# Patient Record
Sex: Male | Born: 1945 | Race: White | Hispanic: No | Marital: Married | Smoking: Former smoker
Health system: Southern US, Community
[De-identification: ages and names within clinical notes are randomized; demographics above are authoritative.]

## PROBLEM LIST (undated history)

## (undated) DIAGNOSIS — K5792 Diverticulitis of intestine, part unspecified, without perforation or abscess without bleeding: Secondary | ICD-10-CM

## (undated) DIAGNOSIS — K219 Gastro-esophageal reflux disease without esophagitis: Secondary | ICD-10-CM

## (undated) DIAGNOSIS — I77810 Thoracic aortic ectasia: Secondary | ICD-10-CM

## (undated) DIAGNOSIS — I480 Paroxysmal atrial fibrillation: Secondary | ICD-10-CM

## (undated) DIAGNOSIS — I499 Cardiac arrhythmia, unspecified: Secondary | ICD-10-CM

## (undated) DIAGNOSIS — M1990 Unspecified osteoarthritis, unspecified site: Secondary | ICD-10-CM

## (undated) DIAGNOSIS — E785 Hyperlipidemia, unspecified: Secondary | ICD-10-CM

## (undated) DIAGNOSIS — M75100 Unspecified rotator cuff tear or rupture of unspecified shoulder, not specified as traumatic: Secondary | ICD-10-CM

## (undated) DIAGNOSIS — N529 Male erectile dysfunction, unspecified: Secondary | ICD-10-CM

## (undated) DIAGNOSIS — I82409 Acute embolism and thrombosis of unspecified deep veins of unspecified lower extremity: Secondary | ICD-10-CM

## (undated) DIAGNOSIS — I700 Atherosclerosis of aorta: Secondary | ICD-10-CM

## (undated) DIAGNOSIS — Z9289 Personal history of other medical treatment: Secondary | ICD-10-CM

## (undated) DIAGNOSIS — Z9889 Other specified postprocedural states: Secondary | ICD-10-CM

## (undated) DIAGNOSIS — N419 Inflammatory disease of prostate, unspecified: Secondary | ICD-10-CM

## (undated) DIAGNOSIS — K5790 Diverticulosis of intestine, part unspecified, without perforation or abscess without bleeding: Secondary | ICD-10-CM

## (undated) DIAGNOSIS — Z923 Personal history of irradiation: Secondary | ICD-10-CM

## (undated) DIAGNOSIS — C61 Malignant neoplasm of prostate: Secondary | ICD-10-CM

## (undated) HISTORY — DX: Inflammatory disease of prostate, unspecified: N41.9

## (undated) HISTORY — DX: Acute embolism and thrombosis of unspecified deep veins of unspecified lower extremity: I82.409

## (undated) HISTORY — DX: Gastro-esophageal reflux disease without esophagitis: K21.9

## (undated) HISTORY — PX: KNEE ARTHROSCOPY: SUR90

## (undated) HISTORY — PX: LARYNX SURGERY: SHX692

## (undated) HISTORY — DX: Paroxysmal atrial fibrillation: I48.0

## (undated) HISTORY — DX: Hyperlipidemia, unspecified: E78.5

## (undated) HISTORY — PX: OTHER SURGICAL HISTORY: SHX169

## (undated) HISTORY — DX: Diverticulosis of intestine, part unspecified, without perforation or abscess without bleeding: K57.90

## (undated) HISTORY — DX: Unspecified osteoarthritis, unspecified site: M19.90

## (undated) HISTORY — DX: Male erectile dysfunction, unspecified: N52.9

## (undated) HISTORY — PX: POLYPECTOMY: SHX149

## (undated) HISTORY — DX: Malignant neoplasm of prostate: C61

## (undated) HISTORY — DX: Personal history of other medical treatment: Z92.89

## (undated) HISTORY — PX: COLONOSCOPY: SHX174

## (undated) HISTORY — PX: APPENDECTOMY: SHX54

## (undated) HISTORY — PX: MENISCUS REPAIR: SHX5179

## (undated) HISTORY — DX: Diverticulitis of intestine, part unspecified, without perforation or abscess without bleeding: K57.92

## (undated) HISTORY — DX: Other specified postprocedural states: Z98.890

---

## 1965-03-20 HISTORY — PX: APPENDECTOMY: SHX54

## 1997-03-20 HISTORY — PX: PROSTATE BIOPSY: SHX241

## 1998-03-09 ENCOUNTER — Other Ambulatory Visit: Admission: RE | Admit: 1998-03-09 | Discharge: 1998-03-09 | Payer: Self-pay | Admitting: Urology

## 1998-03-20 HISTORY — PX: LARYNX SURGERY: SHX692

## 2001-02-19 ENCOUNTER — Encounter: Admission: RE | Admit: 2001-02-19 | Discharge: 2001-02-19 | Payer: Self-pay | Admitting: Otolaryngology

## 2001-02-19 ENCOUNTER — Encounter: Payer: Self-pay | Admitting: Otolaryngology

## 2001-11-24 ENCOUNTER — Ambulatory Visit (HOSPITAL_COMMUNITY): Admission: RE | Admit: 2001-11-24 | Discharge: 2001-11-24 | Payer: Self-pay | Admitting: Internal Medicine

## 2002-03-02 ENCOUNTER — Encounter: Payer: Self-pay | Admitting: Emergency Medicine

## 2002-03-02 ENCOUNTER — Emergency Department (HOSPITAL_COMMUNITY): Admission: EM | Admit: 2002-03-02 | Discharge: 2002-03-02 | Payer: Self-pay | Admitting: Emergency Medicine

## 2003-10-25 ENCOUNTER — Emergency Department (HOSPITAL_COMMUNITY): Admission: EM | Admit: 2003-10-25 | Discharge: 2003-10-25 | Payer: Self-pay | Admitting: Emergency Medicine

## 2004-08-04 ENCOUNTER — Encounter: Admission: RE | Admit: 2004-08-04 | Discharge: 2004-08-04 | Payer: Self-pay | Admitting: Internal Medicine

## 2006-02-21 ENCOUNTER — Inpatient Hospital Stay (HOSPITAL_COMMUNITY): Admission: EM | Admit: 2006-02-21 | Discharge: 2006-02-23 | Payer: Self-pay | Admitting: Urology

## 2006-03-01 ENCOUNTER — Ambulatory Visit (HOSPITAL_COMMUNITY): Admission: RE | Admit: 2006-03-01 | Discharge: 2006-03-01 | Payer: Self-pay | Admitting: Urology

## 2006-03-05 ENCOUNTER — Inpatient Hospital Stay (HOSPITAL_COMMUNITY): Admission: AD | Admit: 2006-03-05 | Discharge: 2006-03-08 | Payer: Self-pay | Admitting: Urology

## 2006-03-05 ENCOUNTER — Ambulatory Visit (HOSPITAL_COMMUNITY): Admission: RE | Admit: 2006-03-05 | Discharge: 2006-03-05 | Payer: Self-pay | Admitting: Urology

## 2006-03-06 ENCOUNTER — Ambulatory Visit: Payer: Self-pay | Admitting: Infectious Diseases

## 2006-04-25 ENCOUNTER — Inpatient Hospital Stay (HOSPITAL_COMMUNITY): Admission: RE | Admit: 2006-04-25 | Discharge: 2006-04-27 | Payer: Self-pay | Admitting: Urology

## 2006-04-25 DIAGNOSIS — C61 Malignant neoplasm of prostate: Secondary | ICD-10-CM

## 2006-04-25 HISTORY — DX: Malignant neoplasm of prostate: C61

## 2006-04-25 HISTORY — PX: PROSTATECTOMY: SHX69

## 2006-05-19 ENCOUNTER — Emergency Department (HOSPITAL_COMMUNITY): Admission: EM | Admit: 2006-05-19 | Discharge: 2006-05-19 | Payer: Self-pay | Admitting: Emergency Medicine

## 2008-09-25 ENCOUNTER — Encounter (INDEPENDENT_AMBULATORY_CARE_PROVIDER_SITE_OTHER): Payer: Self-pay | Admitting: *Deleted

## 2010-04-21 NOTE — Letter (Signed)
Summary: Recall Colonoscopy Date Change Letter  [facility]  [address] [place], [place] [address]   Phone: [phone]  Fax: [phone]      September 25, 2008 MRN: [id number]   Brandon Simmons [address] [place], [place]  [address]   Dear Mr. DAVIDSON,   Previously you were recommended to have a repeat colonoscopy around this time. Your chart was recently reviewed by Dr. Marina Goodell of [facility]. Follow up colonoscopy is now recommended in September 2013. This revised recommendation is based on current, nationally recognized guidelines for colorectal cancer screening and polyp surveillance. These guidelines are endorsed by the American Cancer Society, [employer] on Colorectal Cancer as well as numerous other major medical organizations.  Please understand that our recommendation assumes that you do not have any new symptoms such as bleeding, a change in bowel habits, anemia, or significant abdominal discomfort. If you do have any concerning GI symptoms or want to discuss the guideline recommendations, please call to arrange an office visit at your earliest convenience. Otherwise we will keep you in our reminder system and contact you 1-2 months prior to the date listed above to schedule your next colonoscopy.  Thank you,  Wilhemina Bonito. Marina Goodell, M.D.  [facility] [phone]

## 2011-05-08 ENCOUNTER — Encounter: Payer: Self-pay | Admitting: Internal Medicine

## 2011-05-08 ENCOUNTER — Other Ambulatory Visit: Payer: Self-pay | Admitting: Internal Medicine

## 2011-05-08 ENCOUNTER — Ambulatory Visit
Admission: RE | Admit: 2011-05-08 | Discharge: 2011-05-08 | Disposition: A | Discharge status: Home / Self Care | Payer: Medicare Other | Source: Ambulatory Visit | Attending: Internal Medicine | Admitting: Internal Medicine

## 2011-05-08 DIAGNOSIS — R1031 Right lower quadrant pain: Secondary | ICD-10-CM

## 2011-05-08 DIAGNOSIS — J45909 Unspecified asthma, uncomplicated: Secondary | ICD-10-CM | POA: Diagnosis not present

## 2011-05-08 DIAGNOSIS — K5730 Diverticulosis of large intestine without perforation or abscess without bleeding: Secondary | ICD-10-CM | POA: Diagnosis not present

## 2011-05-08 MED ORDER — IOHEXOL 300 MG/ML  SOLN
125.0000 mL | Freq: Once | INTRAMUSCULAR | Status: AC | PRN
  Administered 2011-05-08: 125 mL via INTRAVENOUS

## 2011-05-12 DIAGNOSIS — C61 Malignant neoplasm of prostate: Secondary | ICD-10-CM | POA: Diagnosis not present

## 2011-05-26 DIAGNOSIS — C61 Malignant neoplasm of prostate: Secondary | ICD-10-CM | POA: Diagnosis not present

## 2011-05-26 DIAGNOSIS — N529 Male erectile dysfunction, unspecified: Secondary | ICD-10-CM | POA: Diagnosis not present

## 2011-05-31 DIAGNOSIS — J309 Allergic rhinitis, unspecified: Secondary | ICD-10-CM | POA: Diagnosis not present

## 2011-06-02 DIAGNOSIS — J309 Allergic rhinitis, unspecified: Secondary | ICD-10-CM | POA: Diagnosis not present

## 2011-06-05 ENCOUNTER — Encounter: Payer: Self-pay | Admitting: Internal Medicine

## 2011-06-05 ENCOUNTER — Ambulatory Visit (INDEPENDENT_AMBULATORY_CARE_PROVIDER_SITE_OTHER): Payer: Medicare Other | Admitting: Internal Medicine

## 2011-06-05 VITALS — BP 124/76 | HR 80 | Ht 70.0 in | Wt 212.0 lb

## 2011-06-05 DIAGNOSIS — R1031 Right lower quadrant pain: Secondary | ICD-10-CM

## 2011-06-05 DIAGNOSIS — Z1211 Encounter for screening for malignant neoplasm of colon: Secondary | ICD-10-CM | POA: Diagnosis not present

## 2011-06-05 DIAGNOSIS — J309 Allergic rhinitis, unspecified: Secondary | ICD-10-CM | POA: Diagnosis not present

## 2011-06-05 MED ORDER — PEG-KCL-NACL-NASULF-NA ASC-C 100 G PO SOLR: 1.0000 | Freq: Once | ORAL | Status: DC

## 2011-06-05 NOTE — Patient Instructions (Signed)
You have been scheduled for a colonoscopy with propofol. Please follow written instructions given to you at your visit today.  Please pick up your prep kit at the pharmacy within the next 1-3 days.  

## 2011-06-05 NOTE — Progress Notes (Signed)
HISTORY OF PRESENT ILLNESS:  Brandon Simmons is a 66 y.o. male with the below listed medical history who presents today regarding right lower quadrant/flank discomfort in the need for followup colonoscopy. He last underwent colonoscopy in September 2003 to evaluate rectal bleeding. He was found to have significant left-sided diverticulosis, internal hemorrhoids, and prep induced mild colitis. Followup in 10 years recommended. He was doing well until this fall when he began to notice vague right lower quadrant/flank/back discomfort. The discomfort worsened in late December early January. He underwent acupuncture therapy which did not help. He subsequently brought this to the attention of his primary provider. CT scan of the abdomen and pelvis with contrast was unremarkable. Diverticulosis without diverticulitis and moderate colonic stool noted. This examination was performed 05/08/2011. He is also status post prostatectomy in 2008 for prostate cancer. Comprehensive metabolic panel last month was normal. He cannot identify any exacerbating or relieving factors. He denies any abnormal bowel habits or change in bowel habits. No bleeding, weight loss, or other types of abdominal discomfort. No history of chronic back pain or kidney stones. No hematuria. He is also status post remote appendectomy. No bulge. Most concerned about ruling out serious pathology  REVIEW OF SYSTEMS:  All non-GI ROS negative except for night sweats  Past Medical History  Diagnosis Date  . Hemorrhoids   . Diverticulosis   . Prostatitis   . Asthma   . GERD (gastroesophageal reflux disease)   . Prostate cancer     2008  . Organic erectile dysfunction   . Diverticulitis   . Arthritis     Past Surgical History  Procedure Date  . Prostatectomy     2008  . Knee arthroscopy     R  . Larynx surgery   . Appendectomy     1967    Social History Brandon Simmons  reports that he has quit smoking. He has never used  smokeless tobacco. He reports that he drinks alcohol. He reports that he does not use illicit drugs.  family history includes Diabetes in his maternal grandmother; Emphysema in his father; Heart disease in his maternal grandfather, maternal grandmother, paternal grandfather, and paternal grandmother; Liver cancer in an unspecified family member; and Lung cancer in an unspecified family member.  No Known Allergies     PHYSICAL EXAMINATION: Vital signs: BP 124/76  Pulse 80  Ht 5\' 10"  (1.778 m)  Wt 212 lb (96.163 kg)  BMI 30.42 kg/m2  Constitutional: generally well-appearing, no acute distress Psychiatric: alert and oriented x3, cooperative Eyes: extraocular movements intact, anicteric, conjunctiva pink Mouth: oral pharynx moist, no lesions Neck: supple no lymphadenopathy Cardiovascular: heart regular rate and rhythm, no murmur Lungs: clear to auscultation bilaterally Abdomen: soft, nontender, nondistended, no obvious ascites, no peritoneal signs, normal bowel sounds, no organomegaly. Prior surgical incisions well-healed Rectal:deferred until colonoscopy Extremities: no lower extremity edema bilaterally Skin: no lesions on visible extremities Neuro: No focal deficits.    ASSESSMENT:  #1. Vague right lower quadrant/flank/back pain, of uncertain etiology. Based on history and negative imaging, likely musculoskeletal. #2. Colonoscopy 10 years ago revealing diverticulosis. Due for followup screening #3. History of prostate cancer status post prostatectomy 2008   PLAN:  #1. Colonoscopy.The nature of the procedure, as well as the risks, benefits, and alternatives were carefully and thoroughly reviewed with the patient. Ample time for discussion and questions allowed. The patient understood, was satisfied, and agreed to proceed. Movi prep prescribed. The patient instructed on its use #2. If colonoscopy unrevealing,  then symptomatic therapies as needed

## 2011-06-06 ENCOUNTER — Encounter: Payer: Self-pay | Admitting: Internal Medicine

## 2011-06-07 DIAGNOSIS — J309 Allergic rhinitis, unspecified: Secondary | ICD-10-CM | POA: Diagnosis not present

## 2011-06-13 ENCOUNTER — Encounter: Payer: Self-pay | Admitting: Internal Medicine

## 2011-06-13 ENCOUNTER — Ambulatory Visit (AMBULATORY_SURGERY_CENTER): Payer: Medicare Other | Admitting: Internal Medicine

## 2011-06-13 VITALS — BP 134/74 | HR 85 | Temp 96.8°F | Resp 20 | Ht 70.0 in | Wt 212.0 lb

## 2011-06-13 DIAGNOSIS — Z1211 Encounter for screening for malignant neoplasm of colon: Secondary | ICD-10-CM | POA: Diagnosis not present

## 2011-06-13 DIAGNOSIS — K219 Gastro-esophageal reflux disease without esophagitis: Secondary | ICD-10-CM | POA: Diagnosis not present

## 2011-06-13 DIAGNOSIS — R109 Unspecified abdominal pain: Secondary | ICD-10-CM | POA: Diagnosis not present

## 2011-06-13 DIAGNOSIS — K5730 Diverticulosis of large intestine without perforation or abscess without bleeding: Secondary | ICD-10-CM

## 2011-06-13 DIAGNOSIS — J45909 Unspecified asthma, uncomplicated: Secondary | ICD-10-CM | POA: Diagnosis not present

## 2011-06-13 DIAGNOSIS — R1031 Right lower quadrant pain: Secondary | ICD-10-CM

## 2011-06-13 MED ORDER — SODIUM CHLORIDE 0.9 % IV SOLN: 500.0000 mL | INTRAVENOUS | Status: DC

## 2011-06-13 NOTE — Op Note (Signed)
[facility] [address] [place], [place]  [address]  COLONOSCOPY PROCEDURE REPORT  PATIENT:  Brandon Simmons, Brandon Simmons  MR#:  [id number] BIRTHDATE:  19-Jul-1945, 65 yrs. old  GENDER:  male ENDOSCOPIST:  Wilhemina Bonito. Eda Keys, MD REF. BY:  Office PROCEDURE DATE:  06/13/2011 PROCEDURE:  Average-risk screening colonoscopy G0121 ASA CLASS:  Class II INDICATIONS:  Screening ; negative exam 11-2001; c/o some RLQ / flank / back discomfort MEDICATIONS:   MAC sedation, administered by CRNA, propofol (Diprivan) 400 mg IV  DESCRIPTION OF PROCEDURE:   After the risks benefits and alternatives of the procedure were thoroughly explained, informed consent was obtained.  Digital rectal exam was performed and revealed no abnormalities.   The LB CF-H180AL [id number] endoscope was introduced through the anus and advanced to the cecum, which was identified by both the appendix and ileocecal valve, without limitations.  The quality of the prep was excellent, using MoviPrep.  The instrument was then slowly withdrawn as the colon was fully examined. <<PROCEDUREIMAGES>>  FINDINGS:  Moderate diverticulosis was found in the left colon. Otherwise normal colonoscopy without  polyps, masses, vascular ectasias, or inflammatory changes.  The terminal ileum appeared normal.   Retroflexed views in the rectum revealed internal hemorrhoids.    The time to cecum = 4:05  minutes. The scope was then withdrawn in 13:43  minutes from the cecum and the procedure completed.  COMPLICATIONS:  None  ENDOSCOPIC IMPRESSION: 1) Moderate diverticulosis in the left colon 2) Otherwise normal colonoscopy 3) Normal terminal ileum 4) Internal hemorrhoids  RECOMMENDATIONS: 1) Continue current colorectal screening recommendations for "routine risk" patients with a repeat colonoscopy in 10 years.  ______________________________ Wilhemina Bonito. Eda Keys, MD  CC:  Jarome Matin, MD;  The Patient  n. eSIGNED:   Wilhemina Bonito. Eda Keys at  06/13/2011 12:15 PM  Janne Lab, [id number]

## 2011-06-13 NOTE — Patient Instructions (Signed)

## 2011-06-13 NOTE — Progress Notes (Signed)
Patient did not experience any of the following events: a burn prior to discharge; a fall within the facility; wrong site/side/patient/procedure/implant event; or a hospital transfer or hospital admission upon discharge from the facility. (G8907) Patient did not have preoperative order for IV antibiotic SSI prophylaxis. (G8918)  

## 2011-06-14 ENCOUNTER — Telehealth: Payer: Self-pay | Admitting: *Deleted

## 2011-06-14 NOTE — Telephone Encounter (Signed)
  Follow up Call-  Call back number 06/13/2011  Post procedure Call Back phone  # [phone]  Permission to leave phone message Yes     Patient questions:  Do you have a fever, pain , or abdominal swelling? no Pain Score  0 *  Have you tolerated food without any problems? yes  Have you been able to return to your normal activities? yes  Do you have any questions about your discharge instructions: Diet   no Medications  no Follow up visit  no  Do you have questions or concerns about your Care? no  Actions: * If pain score is 4 or above: No action needed, pain <4.

## 2011-06-15 DIAGNOSIS — J309 Allergic rhinitis, unspecified: Secondary | ICD-10-CM | POA: Diagnosis not present

## 2011-06-19 DIAGNOSIS — J309 Allergic rhinitis, unspecified: Secondary | ICD-10-CM | POA: Diagnosis not present

## 2011-07-18 DIAGNOSIS — J309 Allergic rhinitis, unspecified: Secondary | ICD-10-CM | POA: Diagnosis not present

## 2011-08-07 DIAGNOSIS — J309 Allergic rhinitis, unspecified: Secondary | ICD-10-CM | POA: Diagnosis not present

## 2011-08-18 DIAGNOSIS — R51 Headache: Secondary | ICD-10-CM | POA: Diagnosis not present

## 2011-08-21 DIAGNOSIS — H259 Unspecified age-related cataract: Secondary | ICD-10-CM | POA: Diagnosis not present

## 2011-08-21 DIAGNOSIS — H52209 Unspecified astigmatism, unspecified eye: Secondary | ICD-10-CM | POA: Diagnosis not present

## 2011-08-21 DIAGNOSIS — H5315 Visual distortions of shape and size: Secondary | ICD-10-CM | POA: Diagnosis not present

## 2011-08-21 DIAGNOSIS — H3530 Unspecified macular degeneration: Secondary | ICD-10-CM | POA: Diagnosis not present

## 2011-08-22 ENCOUNTER — Other Ambulatory Visit: Payer: Self-pay | Admitting: Internal Medicine

## 2011-08-22 DIAGNOSIS — R51 Headache: Secondary | ICD-10-CM

## 2011-08-31 ENCOUNTER — Ambulatory Visit
Admission: RE | Admit: 2011-08-31 | Discharge: 2011-08-31 | Disposition: A | Discharge status: Home / Self Care | Payer: Medicare Other | Source: Ambulatory Visit | Attending: Internal Medicine | Admitting: Internal Medicine

## 2011-08-31 DIAGNOSIS — G319 Degenerative disease of nervous system, unspecified: Secondary | ICD-10-CM | POA: Diagnosis not present

## 2011-08-31 DIAGNOSIS — R51 Headache: Secondary | ICD-10-CM

## 2011-09-04 DIAGNOSIS — J309 Allergic rhinitis, unspecified: Secondary | ICD-10-CM | POA: Diagnosis not present

## 2011-10-02 DIAGNOSIS — J309 Allergic rhinitis, unspecified: Secondary | ICD-10-CM | POA: Diagnosis not present

## 2011-10-11 DIAGNOSIS — H259 Unspecified age-related cataract: Secondary | ICD-10-CM | POA: Diagnosis not present

## 2011-10-30 DIAGNOSIS — J309 Allergic rhinitis, unspecified: Secondary | ICD-10-CM | POA: Diagnosis not present

## 2011-11-19 HISTORY — PX: CATARACT EXTRACTION W/ INTRAOCULAR LENS IMPLANT: SHX1309

## 2011-11-28 DIAGNOSIS — J309 Allergic rhinitis, unspecified: Secondary | ICD-10-CM | POA: Diagnosis not present

## 2011-12-12 DIAGNOSIS — H25019 Cortical age-related cataract, unspecified eye: Secondary | ICD-10-CM | POA: Diagnosis not present

## 2011-12-12 DIAGNOSIS — H2510 Age-related nuclear cataract, unspecified eye: Secondary | ICD-10-CM | POA: Diagnosis not present

## 2011-12-12 DIAGNOSIS — H25049 Posterior subcapsular polar age-related cataract, unspecified eye: Secondary | ICD-10-CM | POA: Diagnosis not present

## 2011-12-12 DIAGNOSIS — H269 Unspecified cataract: Secondary | ICD-10-CM | POA: Diagnosis not present

## 2011-12-21 DIAGNOSIS — Z23 Encounter for immunization: Secondary | ICD-10-CM | POA: Diagnosis not present

## 2011-12-26 DIAGNOSIS — J309 Allergic rhinitis, unspecified: Secondary | ICD-10-CM | POA: Diagnosis not present

## 2011-12-29 DIAGNOSIS — E785 Hyperlipidemia, unspecified: Secondary | ICD-10-CM | POA: Diagnosis not present

## 2011-12-29 DIAGNOSIS — Z125 Encounter for screening for malignant neoplasm of prostate: Secondary | ICD-10-CM | POA: Diagnosis not present

## 2012-01-02 DIAGNOSIS — Z1212 Encounter for screening for malignant neoplasm of rectum: Secondary | ICD-10-CM | POA: Diagnosis not present

## 2012-01-05 DIAGNOSIS — Z0000 Encounter for general adult medical examination without abnormal findings: Secondary | ICD-10-CM | POA: Diagnosis not present

## 2012-01-05 DIAGNOSIS — J45909 Unspecified asthma, uncomplicated: Secondary | ICD-10-CM | POA: Diagnosis not present

## 2012-01-05 DIAGNOSIS — R51 Headache: Secondary | ICD-10-CM | POA: Diagnosis not present

## 2012-01-05 DIAGNOSIS — H6120 Impacted cerumen, unspecified ear: Secondary | ICD-10-CM | POA: Diagnosis not present

## 2012-01-11 DIAGNOSIS — R070 Pain in throat: Secondary | ICD-10-CM | POA: Diagnosis not present

## 2012-01-11 DIAGNOSIS — H9209 Otalgia, unspecified ear: Secondary | ICD-10-CM | POA: Diagnosis not present

## 2012-01-11 DIAGNOSIS — H6120 Impacted cerumen, unspecified ear: Secondary | ICD-10-CM | POA: Diagnosis not present

## 2012-01-24 DIAGNOSIS — J309 Allergic rhinitis, unspecified: Secondary | ICD-10-CM | POA: Diagnosis not present

## 2012-02-20 DIAGNOSIS — J309 Allergic rhinitis, unspecified: Secondary | ICD-10-CM | POA: Diagnosis not present

## 2012-03-19 DIAGNOSIS — J309 Allergic rhinitis, unspecified: Secondary | ICD-10-CM | POA: Diagnosis not present

## 2012-04-09 DIAGNOSIS — IMO0002 Reserved for concepts with insufficient information to code with codable children: Secondary | ICD-10-CM | POA: Diagnosis not present

## 2012-04-15 DIAGNOSIS — J309 Allergic rhinitis, unspecified: Secondary | ICD-10-CM | POA: Diagnosis not present

## 2012-04-15 DIAGNOSIS — J45909 Unspecified asthma, uncomplicated: Secondary | ICD-10-CM | POA: Diagnosis not present

## 2012-04-15 DIAGNOSIS — J301 Allergic rhinitis due to pollen: Secondary | ICD-10-CM | POA: Diagnosis not present

## 2012-04-15 DIAGNOSIS — J3089 Other allergic rhinitis: Secondary | ICD-10-CM | POA: Diagnosis not present

## 2012-04-30 DIAGNOSIS — J309 Allergic rhinitis, unspecified: Secondary | ICD-10-CM | POA: Diagnosis not present

## 2012-05-20 DIAGNOSIS — J309 Allergic rhinitis, unspecified: Secondary | ICD-10-CM | POA: Diagnosis not present

## 2012-05-23 DIAGNOSIS — J309 Allergic rhinitis, unspecified: Secondary | ICD-10-CM | POA: Diagnosis not present

## 2012-06-05 DIAGNOSIS — J309 Allergic rhinitis, unspecified: Secondary | ICD-10-CM | POA: Diagnosis not present

## 2012-06-06 DIAGNOSIS — C61 Malignant neoplasm of prostate: Secondary | ICD-10-CM | POA: Diagnosis not present

## 2012-06-10 DIAGNOSIS — J309 Allergic rhinitis, unspecified: Secondary | ICD-10-CM | POA: Diagnosis not present

## 2012-06-12 DIAGNOSIS — C61 Malignant neoplasm of prostate: Secondary | ICD-10-CM | POA: Diagnosis not present

## 2012-06-12 DIAGNOSIS — N529 Male erectile dysfunction, unspecified: Secondary | ICD-10-CM | POA: Diagnosis not present

## 2012-06-12 DIAGNOSIS — J309 Allergic rhinitis, unspecified: Secondary | ICD-10-CM | POA: Diagnosis not present

## 2012-06-17 DIAGNOSIS — J309 Allergic rhinitis, unspecified: Secondary | ICD-10-CM | POA: Diagnosis not present

## 2012-07-12 DIAGNOSIS — L578 Other skin changes due to chronic exposure to nonionizing radiation: Secondary | ICD-10-CM | POA: Diagnosis not present

## 2012-07-12 DIAGNOSIS — L821 Other seborrheic keratosis: Secondary | ICD-10-CM | POA: Diagnosis not present

## 2012-07-12 DIAGNOSIS — L819 Disorder of pigmentation, unspecified: Secondary | ICD-10-CM | POA: Diagnosis not present

## 2012-07-12 DIAGNOSIS — D239 Other benign neoplasm of skin, unspecified: Secondary | ICD-10-CM | POA: Diagnosis not present

## 2012-07-12 DIAGNOSIS — D1801 Hemangioma of skin and subcutaneous tissue: Secondary | ICD-10-CM | POA: Diagnosis not present

## 2012-07-15 DIAGNOSIS — J309 Allergic rhinitis, unspecified: Secondary | ICD-10-CM | POA: Diagnosis not present

## 2012-07-26 DIAGNOSIS — Z6830 Body mass index (BMI) 30.0-30.9, adult: Secondary | ICD-10-CM | POA: Diagnosis not present

## 2012-07-26 DIAGNOSIS — M722 Plantar fascial fibromatosis: Secondary | ICD-10-CM | POA: Diagnosis not present

## 2012-08-13 DIAGNOSIS — J309 Allergic rhinitis, unspecified: Secondary | ICD-10-CM | POA: Diagnosis not present

## 2012-08-29 DIAGNOSIS — H259 Unspecified age-related cataract: Secondary | ICD-10-CM | POA: Diagnosis not present

## 2012-08-29 DIAGNOSIS — H52209 Unspecified astigmatism, unspecified eye: Secondary | ICD-10-CM | POA: Diagnosis not present

## 2012-08-29 DIAGNOSIS — H35369 Drusen (degenerative) of macula, unspecified eye: Secondary | ICD-10-CM | POA: Diagnosis not present

## 2012-08-29 DIAGNOSIS — Z961 Presence of intraocular lens: Secondary | ICD-10-CM | POA: Diagnosis not present

## 2012-09-12 DIAGNOSIS — J309 Allergic rhinitis, unspecified: Secondary | ICD-10-CM | POA: Diagnosis not present

## 2012-09-12 DIAGNOSIS — C61 Malignant neoplasm of prostate: Secondary | ICD-10-CM | POA: Diagnosis not present

## 2012-09-30 DIAGNOSIS — J45909 Unspecified asthma, uncomplicated: Secondary | ICD-10-CM | POA: Diagnosis not present

## 2012-09-30 DIAGNOSIS — N182 Chronic kidney disease, stage 2 (mild): Secondary | ICD-10-CM | POA: Diagnosis not present

## 2012-09-30 DIAGNOSIS — R059 Cough, unspecified: Secondary | ICD-10-CM | POA: Diagnosis not present

## 2012-09-30 DIAGNOSIS — R05 Cough: Secondary | ICD-10-CM | POA: Diagnosis not present

## 2012-09-30 DIAGNOSIS — J209 Acute bronchitis, unspecified: Secondary | ICD-10-CM | POA: Diagnosis not present

## 2012-09-30 DIAGNOSIS — J301 Allergic rhinitis due to pollen: Secondary | ICD-10-CM | POA: Diagnosis not present

## 2012-10-04 DIAGNOSIS — J301 Allergic rhinitis due to pollen: Secondary | ICD-10-CM | POA: Diagnosis not present

## 2012-10-04 DIAGNOSIS — J3089 Other allergic rhinitis: Secondary | ICD-10-CM | POA: Diagnosis not present

## 2012-10-04 DIAGNOSIS — J45909 Unspecified asthma, uncomplicated: Secondary | ICD-10-CM | POA: Diagnosis not present

## 2012-10-04 DIAGNOSIS — J209 Acute bronchitis, unspecified: Secondary | ICD-10-CM | POA: Diagnosis not present

## 2012-10-16 DIAGNOSIS — J309 Allergic rhinitis, unspecified: Secondary | ICD-10-CM | POA: Diagnosis not present

## 2012-11-14 DIAGNOSIS — J309 Allergic rhinitis, unspecified: Secondary | ICD-10-CM | POA: Diagnosis not present

## 2012-12-02 DIAGNOSIS — R05 Cough: Secondary | ICD-10-CM | POA: Diagnosis not present

## 2012-12-02 DIAGNOSIS — J209 Acute bronchitis, unspecified: Secondary | ICD-10-CM | POA: Diagnosis not present

## 2012-12-02 DIAGNOSIS — J45909 Unspecified asthma, uncomplicated: Secondary | ICD-10-CM | POA: Diagnosis not present

## 2012-12-02 DIAGNOSIS — R059 Cough, unspecified: Secondary | ICD-10-CM | POA: Diagnosis not present

## 2012-12-02 DIAGNOSIS — J9801 Acute bronchospasm: Secondary | ICD-10-CM | POA: Diagnosis not present

## 2012-12-02 DIAGNOSIS — J301 Allergic rhinitis due to pollen: Secondary | ICD-10-CM | POA: Diagnosis not present

## 2012-12-12 DIAGNOSIS — J309 Allergic rhinitis, unspecified: Secondary | ICD-10-CM | POA: Diagnosis not present

## 2012-12-13 DIAGNOSIS — C61 Malignant neoplasm of prostate: Secondary | ICD-10-CM | POA: Diagnosis not present

## 2012-12-20 DIAGNOSIS — Z23 Encounter for immunization: Secondary | ICD-10-CM | POA: Diagnosis not present

## 2013-01-03 DIAGNOSIS — E785 Hyperlipidemia, unspecified: Secondary | ICD-10-CM | POA: Diagnosis not present

## 2013-01-06 DIAGNOSIS — J309 Allergic rhinitis, unspecified: Secondary | ICD-10-CM | POA: Diagnosis not present

## 2013-01-10 DIAGNOSIS — Z79899 Other long term (current) drug therapy: Secondary | ICD-10-CM | POA: Diagnosis not present

## 2013-01-10 DIAGNOSIS — J45909 Unspecified asthma, uncomplicated: Secondary | ICD-10-CM | POA: Diagnosis not present

## 2013-01-10 DIAGNOSIS — E785 Hyperlipidemia, unspecified: Secondary | ICD-10-CM | POA: Diagnosis not present

## 2013-01-10 DIAGNOSIS — C61 Malignant neoplasm of prostate: Secondary | ICD-10-CM | POA: Diagnosis not present

## 2013-01-10 DIAGNOSIS — Z1331 Encounter for screening for depression: Secondary | ICD-10-CM | POA: Diagnosis not present

## 2013-01-10 DIAGNOSIS — Z1212 Encounter for screening for malignant neoplasm of rectum: Secondary | ICD-10-CM | POA: Diagnosis not present

## 2013-01-10 DIAGNOSIS — N182 Chronic kidney disease, stage 2 (mild): Secondary | ICD-10-CM | POA: Diagnosis not present

## 2013-01-10 DIAGNOSIS — Z23 Encounter for immunization: Secondary | ICD-10-CM | POA: Diagnosis not present

## 2013-01-10 DIAGNOSIS — Z0000 Encounter for general adult medical examination without abnormal findings: Secondary | ICD-10-CM | POA: Diagnosis not present

## 2013-02-06 DIAGNOSIS — J309 Allergic rhinitis, unspecified: Secondary | ICD-10-CM | POA: Diagnosis not present

## 2013-02-27 DIAGNOSIS — C61 Malignant neoplasm of prostate: Secondary | ICD-10-CM | POA: Diagnosis not present

## 2013-03-12 DIAGNOSIS — J309 Allergic rhinitis, unspecified: Secondary | ICD-10-CM | POA: Diagnosis not present

## 2013-04-08 DIAGNOSIS — J309 Allergic rhinitis, unspecified: Secondary | ICD-10-CM | POA: Diagnosis not present

## 2013-04-10 DIAGNOSIS — J309 Allergic rhinitis, unspecified: Secondary | ICD-10-CM | POA: Diagnosis not present

## 2013-04-14 DIAGNOSIS — J45909 Unspecified asthma, uncomplicated: Secondary | ICD-10-CM | POA: Diagnosis not present

## 2013-04-14 DIAGNOSIS — J301 Allergic rhinitis due to pollen: Secondary | ICD-10-CM | POA: Diagnosis not present

## 2013-04-14 DIAGNOSIS — J3089 Other allergic rhinitis: Secondary | ICD-10-CM | POA: Diagnosis not present

## 2013-05-05 DIAGNOSIS — J309 Allergic rhinitis, unspecified: Secondary | ICD-10-CM | POA: Diagnosis not present

## 2013-05-07 DIAGNOSIS — J309 Allergic rhinitis, unspecified: Secondary | ICD-10-CM | POA: Diagnosis not present

## 2013-05-12 DIAGNOSIS — J309 Allergic rhinitis, unspecified: Secondary | ICD-10-CM | POA: Diagnosis not present

## 2013-05-21 DIAGNOSIS — J309 Allergic rhinitis, unspecified: Secondary | ICD-10-CM | POA: Diagnosis not present

## 2013-05-23 DIAGNOSIS — J309 Allergic rhinitis, unspecified: Secondary | ICD-10-CM | POA: Diagnosis not present

## 2013-05-26 DIAGNOSIS — J309 Allergic rhinitis, unspecified: Secondary | ICD-10-CM | POA: Diagnosis not present

## 2013-06-09 DIAGNOSIS — C61 Malignant neoplasm of prostate: Secondary | ICD-10-CM | POA: Diagnosis not present

## 2013-06-13 DIAGNOSIS — N529 Male erectile dysfunction, unspecified: Secondary | ICD-10-CM | POA: Diagnosis not present

## 2013-06-13 DIAGNOSIS — C61 Malignant neoplasm of prostate: Secondary | ICD-10-CM | POA: Diagnosis not present

## 2013-06-21 DIAGNOSIS — J019 Acute sinusitis, unspecified: Secondary | ICD-10-CM | POA: Diagnosis not present

## 2013-06-21 DIAGNOSIS — R059 Cough, unspecified: Secondary | ICD-10-CM | POA: Diagnosis not present

## 2013-06-21 DIAGNOSIS — R05 Cough: Secondary | ICD-10-CM | POA: Diagnosis not present

## 2013-06-23 DIAGNOSIS — R05 Cough: Secondary | ICD-10-CM | POA: Diagnosis not present

## 2013-06-23 DIAGNOSIS — J45909 Unspecified asthma, uncomplicated: Secondary | ICD-10-CM | POA: Diagnosis not present

## 2013-06-23 DIAGNOSIS — J301 Allergic rhinitis due to pollen: Secondary | ICD-10-CM | POA: Diagnosis not present

## 2013-06-23 DIAGNOSIS — R059 Cough, unspecified: Secondary | ICD-10-CM | POA: Diagnosis not present

## 2013-07-11 DIAGNOSIS — L819 Disorder of pigmentation, unspecified: Secondary | ICD-10-CM | POA: Diagnosis not present

## 2013-07-11 DIAGNOSIS — D1801 Hemangioma of skin and subcutaneous tissue: Secondary | ICD-10-CM | POA: Diagnosis not present

## 2013-07-11 DIAGNOSIS — L821 Other seborrheic keratosis: Secondary | ICD-10-CM | POA: Diagnosis not present

## 2013-07-11 DIAGNOSIS — Q825 Congenital non-neoplastic nevus: Secondary | ICD-10-CM | POA: Diagnosis not present

## 2013-08-04 DIAGNOSIS — Z6830 Body mass index (BMI) 30.0-30.9, adult: Secondary | ICD-10-CM | POA: Diagnosis not present

## 2013-08-04 DIAGNOSIS — R059 Cough, unspecified: Secondary | ICD-10-CM | POA: Diagnosis not present

## 2013-08-04 DIAGNOSIS — R05 Cough: Secondary | ICD-10-CM | POA: Diagnosis not present

## 2013-08-04 DIAGNOSIS — J45909 Unspecified asthma, uncomplicated: Secondary | ICD-10-CM | POA: Diagnosis not present

## 2013-08-25 DIAGNOSIS — J309 Allergic rhinitis, unspecified: Secondary | ICD-10-CM | POA: Diagnosis not present

## 2013-08-27 DIAGNOSIS — J309 Allergic rhinitis, unspecified: Secondary | ICD-10-CM | POA: Diagnosis not present

## 2013-08-29 DIAGNOSIS — J309 Allergic rhinitis, unspecified: Secondary | ICD-10-CM | POA: Diagnosis not present

## 2013-09-01 DIAGNOSIS — Z961 Presence of intraocular lens: Secondary | ICD-10-CM | POA: Diagnosis not present

## 2013-09-01 DIAGNOSIS — J309 Allergic rhinitis, unspecified: Secondary | ICD-10-CM | POA: Diagnosis not present

## 2013-09-01 DIAGNOSIS — H3530 Unspecified macular degeneration: Secondary | ICD-10-CM | POA: Diagnosis not present

## 2013-09-01 DIAGNOSIS — H259 Unspecified age-related cataract: Secondary | ICD-10-CM | POA: Diagnosis not present

## 2013-09-04 DIAGNOSIS — J309 Allergic rhinitis, unspecified: Secondary | ICD-10-CM | POA: Diagnosis not present

## 2013-09-08 DIAGNOSIS — J309 Allergic rhinitis, unspecified: Secondary | ICD-10-CM | POA: Diagnosis not present

## 2013-09-15 DIAGNOSIS — C61 Malignant neoplasm of prostate: Secondary | ICD-10-CM | POA: Diagnosis not present

## 2013-09-15 DIAGNOSIS — J309 Allergic rhinitis, unspecified: Secondary | ICD-10-CM | POA: Diagnosis not present

## 2013-09-17 DIAGNOSIS — J309 Allergic rhinitis, unspecified: Secondary | ICD-10-CM | POA: Diagnosis not present

## 2013-09-23 DIAGNOSIS — J309 Allergic rhinitis, unspecified: Secondary | ICD-10-CM | POA: Diagnosis not present

## 2013-09-25 DIAGNOSIS — J309 Allergic rhinitis, unspecified: Secondary | ICD-10-CM | POA: Diagnosis not present

## 2013-09-29 DIAGNOSIS — J309 Allergic rhinitis, unspecified: Secondary | ICD-10-CM | POA: Diagnosis not present

## 2013-10-02 DIAGNOSIS — J309 Allergic rhinitis, unspecified: Secondary | ICD-10-CM | POA: Diagnosis not present

## 2013-10-07 DIAGNOSIS — J309 Allergic rhinitis, unspecified: Secondary | ICD-10-CM | POA: Diagnosis not present

## 2013-10-09 DIAGNOSIS — J309 Allergic rhinitis, unspecified: Secondary | ICD-10-CM | POA: Diagnosis not present

## 2013-10-13 DIAGNOSIS — J309 Allergic rhinitis, unspecified: Secondary | ICD-10-CM | POA: Diagnosis not present

## 2013-10-14 ENCOUNTER — Encounter: Payer: Self-pay | Admitting: Radiation Oncology

## 2013-10-14 NOTE — Progress Notes (Signed)
GU Location of Tumor / Histology: prostate  If Prostate Cancer, Gleason Score is (3 + 4) and PSA is (0.19 on 09/15/13 s/p prostatectomy 04/2006) 06/09/13 PSA 0.13 02/27/13 PSA 0.10 12/13/12 PSA  0.11  Navin Joycie Peek presented 4 months ago with signs/symptoms of: Increasing PSA s/p prostatectomy on 04/25/2006  Biopsies of prostate (if applicable) revealed:  11/23/930   Past/Anticipated interventions by urology, if any: PSA surveiilance  Past/Anticipated interventions by medical oncology, if any: none  Weight changes, if any: no  Bowel/Bladder complaints, if any:  IPSS 6, nocturia x 2, urgency  Nausea/Vomiting, if any: no  Pain issues, if any:  Occasional hip pain  SAFETY ISSUES:  Prior radiation? no  Pacemaker/ICD? no  Possible current pregnancy? na  Is the patient on methotrexate? 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 Patient will also get second opinion at [facility], Dr Thayer Jew.

## 2013-10-15 DIAGNOSIS — J309 Allergic rhinitis, unspecified: Secondary | ICD-10-CM | POA: Diagnosis not present

## 2013-10-16 ENCOUNTER — Ambulatory Visit
Admission: RE | Admit: 2013-10-16 | Discharge: 2013-10-16 | Disposition: A | Discharge status: Home / Self Care | Payer: Medicare Other | Source: Ambulatory Visit | Attending: Radiation Oncology | Admitting: Radiation Oncology

## 2013-10-16 ENCOUNTER — Encounter: Payer: Self-pay | Admitting: Radiation Oncology

## 2013-10-16 VITALS — BP 142/70 | HR 63 | Temp 98.0°F | Resp 20 | Ht 70.0 in | Wt 213.0 lb

## 2013-10-16 DIAGNOSIS — IMO0002 Reserved for concepts with insufficient information to code with codable children: Secondary | ICD-10-CM | POA: Insufficient documentation

## 2013-10-16 DIAGNOSIS — N529 Male erectile dysfunction, unspecified: Secondary | ICD-10-CM | POA: Insufficient documentation

## 2013-10-16 DIAGNOSIS — K219 Gastro-esophageal reflux disease without esophagitis: Secondary | ICD-10-CM | POA: Diagnosis not present

## 2013-10-16 DIAGNOSIS — Z9079 Acquired absence of other genital organ(s): Secondary | ICD-10-CM | POA: Insufficient documentation

## 2013-10-16 DIAGNOSIS — Z87891 Personal history of nicotine dependence: Secondary | ICD-10-CM | POA: Insufficient documentation

## 2013-10-16 DIAGNOSIS — Z9089 Acquired absence of other organs: Secondary | ICD-10-CM | POA: Diagnosis not present

## 2013-10-16 DIAGNOSIS — Z79899 Other long term (current) drug therapy: Secondary | ICD-10-CM | POA: Diagnosis not present

## 2013-10-16 DIAGNOSIS — Z791 Long term (current) use of non-steroidal anti-inflammatories (NSAID): Secondary | ICD-10-CM | POA: Diagnosis not present

## 2013-10-16 DIAGNOSIS — C61 Malignant neoplasm of prostate: Secondary | ICD-10-CM

## 2013-10-16 DIAGNOSIS — Z7982 Long term (current) use of aspirin: Secondary | ICD-10-CM | POA: Diagnosis not present

## 2013-10-16 DIAGNOSIS — J45909 Unspecified asthma, uncomplicated: Secondary | ICD-10-CM | POA: Insufficient documentation

## 2013-10-16 DIAGNOSIS — Z510 Encounter for antineoplastic radiation therapy: Secondary | ICD-10-CM | POA: Insufficient documentation

## 2013-10-16 NOTE — Progress Notes (Signed)
[facility] NEW PATIENT EVALUATION  Name: Brandon Simmons MRN: [id number]  Date:   10/16/2013           DOB: 1945/07/29  Status: outpatient   CC: Brandon Lopes, MD  Brandon Bring, MD ,  Dr. Lawanna Kobus [facility])   REFERRING PHYSICIAN: [name], MD   DIAGNOSIS: PSA recurrent carcinoma the prostate   HISTORY OF PRESENT ILLNESS:  Brandon Simmons is a 68 y.o. male who is seen today through the courtesy of Dr. Dutch Gray for discussion of possible salvage radiation therapy in the management of his PSA recurrent carcinoma the prostate. He presented to Dr. [name] in late 2007 with an elevated PSA of 8.96. He was found have Gleason 7 (3+4) disease. On 04/25/2006 he underwent a bilateral nerves sparing robotic assisted laparoscopic radical prostatectomy and bilateral lymphadenectomy by Dr. [name]. He was found to have pathologic stage T2c disease with negative surgical margins and negative seminal vesicles. 5 lymph nodes on the right and 4 lymph nodes in the left were benign. There was involvement of both lobes with focal perineural invasion. Only 5% of the prostate tissue was involved by tumor. He did well postoperatively although he has had some erectile dysfunction which has responded well to prostaglandin injection therapy. I understand that his ultrasensitive PSA in March of 2014 was found to be 0.06. A followup PSA on 12/13/2012 was 0.11, falling back to 0.10 on 02/27/2013, then rising to 0.13 on 06/09/2013, and most recently 0.19 on 09/15/2013. He is doing well from a GU and GI standpoint. He plans on seeing Dr. Lawanna Kobus at [facility] in the near future.  PREVIOUS RADIATION THERAPY: No   PAST MEDICAL HISTORY:  has a past medical history of Hemorrhoids; Diverticulosis; Prostatitis; Asthma; GERD (gastroesophageal reflux disease); Organic erectile dysfunction; Diverticulitis; Arthritis; and Prostate cancer (04/25/2006).     PAST SURGICAL HISTORY:   Past Surgical History  Procedure Laterality Date  . Prostatectomy  04/25/2006  . Knee arthroscopy      R  . Larynx surgery      vocal cord lesion- benign  . Appendectomy      1967  . Colonoscopy    . Polypectomy    . Cataract surgery Left      FAMILY HISTORY: family history includes Diabetes in his maternal grandmother; Emphysema in his father; Heart disease in his maternal grandfather, maternal grandmother, paternal grandfather, and paternal grandmother; Hypertension in his mother; Liver cancer in an other family member; Lung cancer in his father and another family member. His mother is alive at age 32 and lives independently with the assistance of her daughter. His father died from lung cancer at 73. No family history of prostate cancer.   SOCIAL HISTORY:  reports that he has quit smoking. He quit smokeless tobacco use about 36 years ago. He reports that he drinks about 4.2 ounces of alcohol per week. He reports that he does not use illicit drugs. Married, 2 children, 2 stepchildren. 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   ALLERGIES: Review of patient's allergies indicates no known allergies.   MEDICATIONS:  Current Outpatient Prescriptions  Medication Sig Dispense Refill  . albuterol (PROVENTIL HFA;VENTOLIN HFA) 108 (90 BASE) MCG/ACT inhaler Inhale 2 puffs into the lungs every 6 (six) hours as needed.      . Alprostadil (PROSTAGLANDIN E1) POWD by Does not apply route as needed.      . AMBULATORY  NON FORMULARY MEDICATION Medication Name: [employer] [place] Herbs for Joint Pain      . aspirin 81 MG tablet Take 81 mg by mouth daily.      Marland Kitchen atorvastatin (LIPITOR) 40 MG tablet Take 40 mg by mouth daily.      Marland Kitchen Bioflavonoid Products (VITAMIN C PLUS PO) Take 1 tablet by mouth daily.      . cholecalciferol (VITAMIN D) 1000 UNITS tablet Take 1,000 Units by mouth daily.      Marland Kitchen HYDROcodone-acetaminophen (NORCO) 5-325 MG per tablet Take 1 tablet  by mouth every 6 (six) hours as needed. 2.5-162.5 as needed      . L-Lysine 500 MG TABS Take 1 tablet by mouth daily.      . meloxicam (MOBIC) 15 MG tablet Take 7.5 mg by mouth daily.       . Multiple Vitamin (MULTIVITAMIN) capsule Take 1 capsule by mouth daily.      Marland Kitchen omeprazole (PRILOSEC) 20 MG capsule Take 20 mg by mouth 2 times daily at 12 noon and 4 pm.       . predniSONE (DELTASONE) 10 MG tablet Take 10 mg by mouth daily with breakfast.      . Probiotic Product (ALIGN) 4 MG CAPS Take by mouth 1 day or 1 dose.      . psyllium (REGULOID) 0.52 G capsule Take 0.52 g by mouth 2 (two) times daily.      Marland Kitchen zolpidem (AMBIEN) 10 MG tablet Take 10 mg by mouth at bedtime as needed for sleep.       No current facility-administered medications for this encounter.     REVIEW OF SYSTEMS:  Pertinent items are noted in HPI.    PHYSICAL EXAM:  height is 5\' 10"  (1.778 m) and weight is 213 lb (96.616 kg). His oral temperature is 98 F (36.7 C). His blood pressure is 142/70 and his pulse is 63. His respiration is 20.   He is not examined today.   LABORATORY DATA:  No results found for this basename: WBC, HGB, HCT, MCV, PLT   No results found for this basename: NA, K, CL, CO2   No results found for this basename: ALT, AST, GGT, ALKPHOS, BILITOT   PSA 0.19 from 09/15/2013   IMPRESSION: PSA recurrent carcinoma the prostate. I explained to the patient and his wife that he has either localized disease alone, distant disease alone, or both localized and distant disease. Clinical predictors for localized disease alone include pathologic stage T3 disease/positive margin, prolonged disease-free interval, initial Gleason score of 7 or less with a PSA less than 10, and a slow PSA doubling time. The strongest predictor for a local recurrence is a positive margin which he does not have. In his favor is a disease-free interval. We discussed the natural history of PSA recurrent carcinoma the prostate and the  potential benefits of radiation therapy. He understands that long-term disease-free survival in this setting is probably no more than one third following radiation therapy. We discussed the potential acute and late toxicities of radiation therapy (IMRT). We discussed treatment with a comfortably full bladder to minimize urinary-related toxicity. He was given literature for review. He has a busy fall with consulting work, and if he wants to proceed with radiation therapy it may not be until late October or early November. He'll contact me if he would like to proceed with salvage radiation therapy. In the meantime, he will visit Dr. Lawanna Kobus at [facility].   PLAN: As discussed above.  I spent 60 minutes face to face with the patient and more than 50% of that time was spent in counseling and/or coordination of care.

## 2013-10-16 NOTE — Progress Notes (Signed)
Please see the Nurse Progress Note in the MD Initial Consult Encounter for this patient. 

## 2013-11-17 DIAGNOSIS — C61 Malignant neoplasm of prostate: Secondary | ICD-10-CM | POA: Diagnosis not present

## 2013-11-18 DIAGNOSIS — J309 Allergic rhinitis, unspecified: Secondary | ICD-10-CM | POA: Diagnosis not present

## 2013-11-19 ENCOUNTER — Encounter: Payer: Self-pay | Admitting: Radiation Oncology

## 2013-11-19 NOTE — Progress Notes (Signed)
Chart note: The patient called me today and wants to start radiotherapy to his prostate bed on November 2. He'll visit [place] for simulation/treatment planning the week of October 12.

## 2013-12-18 DIAGNOSIS — J301 Allergic rhinitis due to pollen: Secondary | ICD-10-CM | POA: Diagnosis not present

## 2013-12-18 DIAGNOSIS — Z23 Encounter for immunization: Secondary | ICD-10-CM | POA: Diagnosis not present

## 2013-12-18 DIAGNOSIS — J3089 Other allergic rhinitis: Secondary | ICD-10-CM | POA: Diagnosis not present

## 2013-12-22 DIAGNOSIS — C61 Malignant neoplasm of prostate: Secondary | ICD-10-CM | POA: Diagnosis not present

## 2013-12-26 DIAGNOSIS — N529 Male erectile dysfunction, unspecified: Secondary | ICD-10-CM | POA: Diagnosis not present

## 2013-12-26 DIAGNOSIS — Z8546 Personal history of malignant neoplasm of prostate: Secondary | ICD-10-CM | POA: Diagnosis not present

## 2014-01-01 ENCOUNTER — Ambulatory Visit
Admission: RE | Admit: 2014-01-01 | Discharge: 2014-01-01 | Disposition: A | Discharge status: Home / Self Care | Payer: Medicare Other | Source: Ambulatory Visit | Attending: Radiation Oncology | Admitting: Radiation Oncology

## 2014-01-01 DIAGNOSIS — C61 Malignant neoplasm of prostate: Secondary | ICD-10-CM | POA: Diagnosis not present

## 2014-01-01 DIAGNOSIS — Z510 Encounter for antineoplastic radiation therapy: Secondary | ICD-10-CM | POA: Insufficient documentation

## 2014-01-01 NOTE — Progress Notes (Signed)
Complex simulation/treatment planning note: The patient was taken to the CT simulator. A VAC LOC immobilization device was constructed. A red rubber tube was placed within the rectal vault. He was in catheterized and contrast instilled into his bladder/urethra. His pelvis was scanned. I chose an isocenter. The CT data set was sent to the MIM planning system where contoured his high-risk prostate bed (CTV 66) and expanded this by 0.5 cm to create PTV 66. I contoured his low-risk prostate bed (CTV 54.45) and expanded this by 0.5 cm to create PTV 54.45. The high-risk prostate bed will receive 6600 cGy 33 sessions while the low-risk prostate bed will receive 5445 cGy in 33 sessions. I also contoured his bladder, rectum, and rectosigmoid.

## 2014-01-05 ENCOUNTER — Encounter: Payer: Self-pay | Admitting: Radiation Oncology

## 2014-01-05 DIAGNOSIS — C61 Malignant neoplasm of prostate: Secondary | ICD-10-CM | POA: Diagnosis not present

## 2014-01-05 DIAGNOSIS — Z510 Encounter for antineoplastic radiation therapy: Secondary | ICD-10-CM | POA: Diagnosis not present

## 2014-01-05 NOTE — Progress Notes (Signed)
IMRT simulation/treatment planning note: Dr. Davison completed his IMRT simulation/planning in the management of his PSA recurrent carcinoma the prostate. IMRT was chosen to decrease the risk for both acute and late bladder and rectal toxicity compared to conventional or 3-D conformal radiation therapy. Dose volume histograms were obtained for the prostate bed and avoidance structures including the bladder, rectum, and femoral heads. We met our departmental guidelines  except for moderate dose to his bladder related to less than ideal bladder filling. This can be improved during his course of therapy with better bladder filling. He is being treated with dual ARC VMAT IMRT. I am prescribing 6600 cGy 33 sessions to his high-risk prostate bed. 

## 2014-01-06 DIAGNOSIS — Z510 Encounter for antineoplastic radiation therapy: Secondary | ICD-10-CM | POA: Diagnosis not present

## 2014-01-06 DIAGNOSIS — C61 Malignant neoplasm of prostate: Secondary | ICD-10-CM | POA: Diagnosis not present

## 2014-01-08 DIAGNOSIS — C61 Malignant neoplasm of prostate: Secondary | ICD-10-CM | POA: Diagnosis not present

## 2014-01-08 DIAGNOSIS — Z510 Encounter for antineoplastic radiation therapy: Secondary | ICD-10-CM | POA: Diagnosis not present

## 2014-01-12 ENCOUNTER — Ambulatory Visit: Payer: Medicare Other | Admitting: Radiation Oncology

## 2014-01-13 ENCOUNTER — Ambulatory Visit: Payer: Medicare Other

## 2014-01-14 ENCOUNTER — Ambulatory Visit: Payer: Medicare Other

## 2014-01-15 ENCOUNTER — Ambulatory Visit: Payer: Medicare Other

## 2014-01-16 ENCOUNTER — Ambulatory Visit: Payer: Medicare Other

## 2014-01-19 ENCOUNTER — Encounter: Payer: Self-pay | Admitting: Radiation Oncology

## 2014-01-19 ENCOUNTER — Ambulatory Visit
Admission: RE | Admit: 2014-01-19 | Discharge: 2014-01-19 | Disposition: A | Discharge status: Home / Self Care | Payer: Medicare Other | Source: Ambulatory Visit | Attending: Radiation Oncology | Admitting: Radiation Oncology

## 2014-01-19 ENCOUNTER — Inpatient Hospital Stay
Admission: RE | Admit: 2014-01-19 | Discharge: 2014-01-19 | Disposition: A | Discharge status: Home / Self Care | Payer: Self-pay | Source: Ambulatory Visit | Attending: Radiation Oncology | Admitting: Radiation Oncology

## 2014-01-19 ENCOUNTER — Ambulatory Visit: Payer: Medicare Other

## 2014-01-19 VITALS — BP 150/85 | HR 58 | Temp 97.8°F | Resp 20 | Wt 216.2 lb

## 2014-01-19 DIAGNOSIS — C61 Malignant neoplasm of prostate: Secondary | ICD-10-CM | POA: Diagnosis not present

## 2014-01-19 DIAGNOSIS — Z510 Encounter for antineoplastic radiation therapy: Secondary | ICD-10-CM | POA: Diagnosis not present

## 2014-01-19 NOTE — Progress Notes (Signed)
Chart note: The patient began his treatment to his prostate bed today. He was treated with dual ARC VMAT IMRT with 2 sets of dynamic multileaf collimators corresponding to one set of IMRT treatment devices [id number]).

## 2014-01-19 NOTE — Progress Notes (Signed)
Weekly Management Note:  Site:prostate bed Current Dose: 200   cGy Projected Dose: 6600  cGy  Narrative: The patient is seen today for routine under treatment assessment. CBCT/MVCT images/port films were reviewed. The chart was reviewed.   Bladder filling is excellent. No GU or GI difficulties.  Physical Examination:  Filed Vitals:   01/19/14 1043  BP: 150/85  Pulse: 58  Temp: 97.8 F (36.6 C)  Resp: 20  .  Weight: 216 lb 3.2 oz (98.068 kg). No change.  Impression: Tolerating radiation therapy well.  Plan: Continue radiation therapy as planned.

## 2014-01-19 NOTE — Addendum Note (Signed)
Encounter addended by: Andria Rhein, RN on: 01/19/2014 12:51 PM<BR>     Documentation filed: Notes Section, Inpatient Patient Education

## 2014-01-19 NOTE — Progress Notes (Addendum)
Patient education completed with patient and wife. Gave pt "Radiation and You" booklet with all pertinent information marked and discussed, re: rectal discomfort/care, fatigue, urinary/bladder irritation/management, nutrition, pain. All questions answered. Teach back used. Patient denies pain, fatigue, loss of appetite, urinary/bowel issues.

## 2014-01-20 ENCOUNTER — Ambulatory Visit
Admission: RE | Admit: 2014-01-20 | Discharge: 2014-01-20 | Disposition: A | Discharge status: Home / Self Care | Payer: Medicare Other | Source: Ambulatory Visit | Attending: Radiation Oncology | Admitting: Radiation Oncology

## 2014-01-20 DIAGNOSIS — Z510 Encounter for antineoplastic radiation therapy: Secondary | ICD-10-CM | POA: Diagnosis not present

## 2014-01-20 DIAGNOSIS — C61 Malignant neoplasm of prostate: Secondary | ICD-10-CM | POA: Diagnosis not present

## 2014-01-21 ENCOUNTER — Ambulatory Visit
Admission: RE | Admit: 2014-01-21 | Discharge: 2014-01-21 | Disposition: A | Discharge status: Home / Self Care | Payer: Medicare Other | Source: Ambulatory Visit | Attending: Radiation Oncology | Admitting: Radiation Oncology

## 2014-01-21 DIAGNOSIS — C61 Malignant neoplasm of prostate: Secondary | ICD-10-CM | POA: Diagnosis not present

## 2014-01-21 DIAGNOSIS — J301 Allergic rhinitis due to pollen: Secondary | ICD-10-CM | POA: Diagnosis not present

## 2014-01-21 DIAGNOSIS — Z510 Encounter for antineoplastic radiation therapy: Secondary | ICD-10-CM | POA: Diagnosis not present

## 2014-01-22 ENCOUNTER — Ambulatory Visit
Admission: RE | Admit: 2014-01-22 | Discharge: 2014-01-22 | Disposition: A | Discharge status: Home / Self Care | Payer: Medicare Other | Source: Ambulatory Visit | Attending: Radiation Oncology | Admitting: Radiation Oncology

## 2014-01-22 DIAGNOSIS — C61 Malignant neoplasm of prostate: Secondary | ICD-10-CM | POA: Diagnosis not present

## 2014-01-22 DIAGNOSIS — Z510 Encounter for antineoplastic radiation therapy: Secondary | ICD-10-CM | POA: Diagnosis not present

## 2014-01-23 ENCOUNTER — Ambulatory Visit
Admission: RE | Admit: 2014-01-23 | Discharge: 2014-01-23 | Disposition: A | Discharge status: Home / Self Care | Payer: Medicare Other | Source: Ambulatory Visit | Attending: Radiation Oncology | Admitting: Radiation Oncology

## 2014-01-23 DIAGNOSIS — Z510 Encounter for antineoplastic radiation therapy: Secondary | ICD-10-CM | POA: Diagnosis not present

## 2014-01-23 DIAGNOSIS — C61 Malignant neoplasm of prostate: Secondary | ICD-10-CM | POA: Diagnosis not present

## 2014-01-26 ENCOUNTER — Ambulatory Visit
Admission: RE | Admit: 2014-01-26 | Discharge: 2014-01-26 | Disposition: A | Discharge status: Home / Self Care | Payer: Medicare Other | Source: Ambulatory Visit | Attending: Radiation Oncology | Admitting: Radiation Oncology

## 2014-01-26 VITALS — BP 135/78 | HR 62 | Temp 97.7°F | Wt 215.7 lb

## 2014-01-26 DIAGNOSIS — C61 Malignant neoplasm of prostate: Secondary | ICD-10-CM | POA: Diagnosis not present

## 2014-01-26 DIAGNOSIS — Z510 Encounter for antineoplastic radiation therapy: Secondary | ICD-10-CM | POA: Diagnosis not present

## 2014-01-26 NOTE — Progress Notes (Signed)
   Weekly Management Note:  outpatient    ICD-9-CM ICD-10-CM   1. Malignant neoplasm of prostate 185 C61     Current Dose:  12 Gy  Projected Dose: 66 Gy   Narrative:  The patient presents for routine under treatment assessment.  CBCT/MVCT images/Port film x-rays were reviewed.  The chart was checked. No complaints, except mild fatigue in the PM  Physical Findings:  weight is 215 lb 11.2 oz (97.841 kg). His temperature is 97.7 F (36.5 C). His blood pressure is 135/78 and his pulse is 62.  NAD  Impression:  The patient is tolerating radiotherapy.  Plan:  Continue radiotherapy as planned.   ________________________________   Eppie Gibson, M.D.

## 2014-01-26 NOTE — Progress Notes (Signed)
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

## 2014-01-27 ENCOUNTER — Ambulatory Visit
Admission: RE | Admit: 2014-01-27 | Discharge: 2014-01-27 | Disposition: A | Discharge status: Home / Self Care | Payer: Medicare Other | Source: Ambulatory Visit | Attending: Radiation Oncology | Admitting: Radiation Oncology

## 2014-01-27 DIAGNOSIS — C61 Malignant neoplasm of prostate: Secondary | ICD-10-CM | POA: Diagnosis not present

## 2014-01-27 DIAGNOSIS — Z510 Encounter for antineoplastic radiation therapy: Secondary | ICD-10-CM | POA: Diagnosis not present

## 2014-01-28 ENCOUNTER — Ambulatory Visit
Admission: RE | Admit: 2014-01-28 | Discharge: 2014-01-28 | Disposition: A | Discharge status: Home / Self Care | Payer: Medicare Other | Source: Ambulatory Visit | Attending: Radiation Oncology | Admitting: Radiation Oncology

## 2014-01-28 DIAGNOSIS — C61 Malignant neoplasm of prostate: Secondary | ICD-10-CM | POA: Diagnosis not present

## 2014-01-28 DIAGNOSIS — Z510 Encounter for antineoplastic radiation therapy: Secondary | ICD-10-CM | POA: Diagnosis not present

## 2014-01-29 ENCOUNTER — Ambulatory Visit
Admission: RE | Admit: 2014-01-29 | Discharge: 2014-01-29 | Disposition: A | Discharge status: Home / Self Care | Payer: Medicare Other | Source: Ambulatory Visit | Attending: Radiation Oncology | Admitting: Radiation Oncology

## 2014-01-29 DIAGNOSIS — Z510 Encounter for antineoplastic radiation therapy: Secondary | ICD-10-CM | POA: Diagnosis not present

## 2014-01-29 DIAGNOSIS — C61 Malignant neoplasm of prostate: Secondary | ICD-10-CM | POA: Diagnosis not present

## 2014-01-30 ENCOUNTER — Ambulatory Visit
Admission: RE | Admit: 2014-01-30 | Discharge: 2014-01-30 | Disposition: A | Discharge status: Home / Self Care | Payer: Medicare Other | Source: Ambulatory Visit | Attending: Radiation Oncology | Admitting: Radiation Oncology

## 2014-01-30 DIAGNOSIS — C61 Malignant neoplasm of prostate: Secondary | ICD-10-CM | POA: Diagnosis not present

## 2014-01-30 DIAGNOSIS — Z510 Encounter for antineoplastic radiation therapy: Secondary | ICD-10-CM | POA: Diagnosis not present

## 2014-02-02 ENCOUNTER — Ambulatory Visit
Admission: RE | Admit: 2014-02-02 | Discharge: 2014-02-02 | Disposition: A | Discharge status: Home / Self Care | Payer: Medicare Other | Source: Ambulatory Visit | Attending: Radiation Oncology | Admitting: Radiation Oncology

## 2014-02-02 ENCOUNTER — Encounter: Payer: Self-pay | Admitting: Radiation Oncology

## 2014-02-02 VITALS — BP 122/74 | HR 60 | Temp 97.7°F | Resp 10

## 2014-02-02 DIAGNOSIS — C61 Malignant neoplasm of prostate: Secondary | ICD-10-CM

## 2014-02-02 DIAGNOSIS — Z510 Encounter for antineoplastic radiation therapy: Secondary | ICD-10-CM | POA: Diagnosis not present

## 2014-02-02 NOTE — Progress Notes (Signed)
Weekly Management Note:  Site:prostate bed Current Dose:  2200  cGy Projected Dose: 6600  cGy  Narrative: The patient is seen today for routine under treatment assessment. CBCT/MVCT images/port films were reviewed. The chart was reviewed.   Bladder filling is excellent. No new GU or GI difficulties.  Physical Examination:  Filed Vitals:   02/02/14 1031  BP: 122/74  Pulse: 60  Temp: 97.7 F (36.5 C)  Resp: 10  .  Weight:  . No change.  Impression: Tolerating radiation therapy well.  Plan: Continue radiation therapy as planned.

## 2014-02-02 NOTE — Progress Notes (Signed)
He is currently in no pain. Pt complains of, Fatigue.  Reports urinary frequency, urinary hesitancy and flank pain on right Pain with Urination, reports urine is yellow and clear with no foul odor.  Pt states they urinate 2 - 3 times per night.  Pt reports Nausea, a soft bowel movement every day. Reports having "rectal spasms" Wednesday night, which he rates a 7 on the pain scale.  Took a Ativan and Tylenol which helped. Denies having hemorrhoids. Reports having some incontinence with bowel movements on Friday.  Reporting nerve pains over his left foot.  Took Ativan and Tylenol which helped.  The patient eats a regular, healthy diet.

## 2014-02-03 ENCOUNTER — Ambulatory Visit
Admission: RE | Admit: 2014-02-03 | Discharge: 2014-02-03 | Disposition: A | Discharge status: Home / Self Care | Payer: Medicare Other | Source: Ambulatory Visit | Attending: Radiation Oncology | Admitting: Radiation Oncology

## 2014-02-03 DIAGNOSIS — Z510 Encounter for antineoplastic radiation therapy: Secondary | ICD-10-CM | POA: Diagnosis not present

## 2014-02-03 DIAGNOSIS — C61 Malignant neoplasm of prostate: Secondary | ICD-10-CM | POA: Diagnosis not present

## 2014-02-04 ENCOUNTER — Ambulatory Visit
Admission: RE | Admit: 2014-02-04 | Discharge: 2014-02-04 | Disposition: A | Discharge status: Home / Self Care | Payer: Medicare Other | Source: Ambulatory Visit | Attending: Radiation Oncology | Admitting: Radiation Oncology

## 2014-02-04 DIAGNOSIS — Z510 Encounter for antineoplastic radiation therapy: Secondary | ICD-10-CM | POA: Diagnosis not present

## 2014-02-04 DIAGNOSIS — C61 Malignant neoplasm of prostate: Secondary | ICD-10-CM | POA: Diagnosis not present

## 2014-02-05 ENCOUNTER — Ambulatory Visit
Admission: RE | Admit: 2014-02-05 | Discharge: 2014-02-05 | Disposition: A | Discharge status: Home / Self Care | Payer: Medicare Other | Source: Ambulatory Visit | Attending: Radiation Oncology | Admitting: Radiation Oncology

## 2014-02-05 DIAGNOSIS — Z510 Encounter for antineoplastic radiation therapy: Secondary | ICD-10-CM | POA: Diagnosis not present

## 2014-02-05 DIAGNOSIS — C61 Malignant neoplasm of prostate: Secondary | ICD-10-CM | POA: Diagnosis not present

## 2014-02-06 ENCOUNTER — Ambulatory Visit
Admission: RE | Admit: 2014-02-06 | Discharge: 2014-02-06 | Disposition: A | Discharge status: Home / Self Care | Payer: Medicare Other | Source: Ambulatory Visit | Attending: Radiation Oncology | Admitting: Radiation Oncology

## 2014-02-06 DIAGNOSIS — Z510 Encounter for antineoplastic radiation therapy: Secondary | ICD-10-CM | POA: Diagnosis not present

## 2014-02-06 DIAGNOSIS — C61 Malignant neoplasm of prostate: Secondary | ICD-10-CM | POA: Diagnosis not present

## 2014-02-08 ENCOUNTER — Ambulatory Visit
Admission: RE | Admit: 2014-02-08 | Discharge: 2014-02-08 | Disposition: A | Discharge status: Home / Self Care | Payer: Medicare Other | Source: Ambulatory Visit | Attending: Radiation Oncology | Admitting: Radiation Oncology

## 2014-02-08 DIAGNOSIS — Z510 Encounter for antineoplastic radiation therapy: Secondary | ICD-10-CM | POA: Diagnosis not present

## 2014-02-08 DIAGNOSIS — C61 Malignant neoplasm of prostate: Secondary | ICD-10-CM | POA: Diagnosis not present

## 2014-02-09 ENCOUNTER — Encounter: Payer: Self-pay | Admitting: Radiation Oncology

## 2014-02-09 ENCOUNTER — Ambulatory Visit
Admission: RE | Admit: 2014-02-09 | Discharge: 2014-02-09 | Disposition: A | Discharge status: Home / Self Care | Payer: Medicare Other | Source: Ambulatory Visit | Attending: Radiation Oncology | Admitting: Radiation Oncology

## 2014-02-09 VITALS — BP 136/79 | HR 56 | Temp 98.2°F | Resp 20 | Wt 215.6 lb

## 2014-02-09 DIAGNOSIS — C61 Malignant neoplasm of prostate: Secondary | ICD-10-CM | POA: Diagnosis not present

## 2014-02-09 DIAGNOSIS — Z510 Encounter for antineoplastic radiation therapy: Secondary | ICD-10-CM | POA: Diagnosis not present

## 2014-02-09 NOTE — Progress Notes (Signed)
Patient denies pain, states energy level better this week, no loss of appetite. He continues to have urinary frequency, hesitancy. He reports one episode of "mucus from my rectum, but he denies diarrhea.

## 2014-02-09 NOTE — Progress Notes (Signed)
Weekly Management Note:  Site: Prostate bed  Current Dose:  3400  cGy Projected Dose: 6600  cGy  Narrative: The patient is seen today for routine under treatment assessment. CBCT/MVCT images/port films were reviewed. The chart was reviewed.   Bladder filling remains excellent.  No GU or GI difficulty.  Physical Examination:  Filed Vitals:   02/09/14 1037  BP: 136/79  Pulse: 56  Temp: 98.2 F (36.8 C)  Resp: 20  .  Weight: 215 lb 9.6 oz (97.796 kg).  No change.  Impression: Tolerating radiation therapy well.  Plan: Continue radiation therapy as planned.

## 2014-02-10 ENCOUNTER — Ambulatory Visit: Payer: Medicare Other

## 2014-02-10 ENCOUNTER — Ambulatory Visit
Admission: RE | Admit: 2014-02-10 | Discharge: 2014-02-10 | Disposition: A | Discharge status: Home / Self Care | Payer: Medicare Other | Source: Ambulatory Visit | Attending: Radiation Oncology | Admitting: Radiation Oncology

## 2014-02-10 DIAGNOSIS — Z510 Encounter for antineoplastic radiation therapy: Secondary | ICD-10-CM | POA: Diagnosis not present

## 2014-02-10 DIAGNOSIS — C61 Malignant neoplasm of prostate: Secondary | ICD-10-CM | POA: Diagnosis not present

## 2014-02-11 ENCOUNTER — Ambulatory Visit
Admission: RE | Admit: 2014-02-11 | Discharge: 2014-02-11 | Disposition: A | Discharge status: Home / Self Care | Payer: Medicare Other | Source: Ambulatory Visit | Attending: Radiation Oncology | Admitting: Radiation Oncology

## 2014-02-11 DIAGNOSIS — Z510 Encounter for antineoplastic radiation therapy: Secondary | ICD-10-CM | POA: Diagnosis not present

## 2014-02-11 DIAGNOSIS — C61 Malignant neoplasm of prostate: Secondary | ICD-10-CM | POA: Diagnosis not present

## 2014-02-13 ENCOUNTER — Ambulatory Visit: Payer: Medicare Other

## 2014-02-16 ENCOUNTER — Ambulatory Visit
Admission: RE | Admit: 2014-02-16 | Discharge: 2014-02-16 | Disposition: A | Discharge status: Home / Self Care | Payer: Medicare Other | Source: Ambulatory Visit | Attending: Radiation Oncology | Admitting: Radiation Oncology

## 2014-02-16 ENCOUNTER — Encounter: Payer: Self-pay | Admitting: Radiation Oncology

## 2014-02-16 VITALS — BP 133/79 | HR 59 | Temp 97.7°F | Resp 20 | Wt 215.6 lb

## 2014-02-16 DIAGNOSIS — Z510 Encounter for antineoplastic radiation therapy: Secondary | ICD-10-CM | POA: Diagnosis not present

## 2014-02-16 DIAGNOSIS — C61 Malignant neoplasm of prostate: Secondary | ICD-10-CM

## 2014-02-16 NOTE — Progress Notes (Signed)
Weekly Management Note:  Site: Prostate bed Current Dose:  4000  cGy Projected Dose: 6600  cGy  Narrative: The patient is seen today for routine under treatment assessment. CBCT/MVCT images/port films were reviewed. The chart was reviewed.   Bladder filling is excellent.  No new GU or GI difficulty.  He does have mild to moderate fatigue.  Physical Examination:  Filed Vitals:   02/16/14 1001  BP: 133/79  Pulse: 59  Temp: 97.7 F (36.5 C)  Resp: 20  .  Weight: 215 lb 9.6 oz (97.796 kg).  No change.  Impression: Tolerating radiation therapy well.  Plan: Continue radiation therapy as planned.

## 2014-02-16 NOTE — Progress Notes (Signed)
Patient denies pain, bowel issues, loss of appetite. He reports fatigue on some days and occasional dysuria.

## 2014-02-17 ENCOUNTER — Ambulatory Visit
Admission: RE | Admit: 2014-02-17 | Discharge: 2014-02-17 | Disposition: A | Discharge status: Home / Self Care | Payer: Medicare Other | Source: Ambulatory Visit | Attending: Radiation Oncology | Admitting: Radiation Oncology

## 2014-02-17 ENCOUNTER — Ambulatory Visit: Payer: Medicare Other

## 2014-02-17 DIAGNOSIS — J301 Allergic rhinitis due to pollen: Secondary | ICD-10-CM | POA: Diagnosis not present

## 2014-02-17 DIAGNOSIS — Z510 Encounter for antineoplastic radiation therapy: Secondary | ICD-10-CM | POA: Diagnosis not present

## 2014-02-17 DIAGNOSIS — J3089 Other allergic rhinitis: Secondary | ICD-10-CM | POA: Diagnosis not present

## 2014-02-17 DIAGNOSIS — C61 Malignant neoplasm of prostate: Secondary | ICD-10-CM | POA: Diagnosis not present

## 2014-02-18 ENCOUNTER — Ambulatory Visit
Admission: RE | Admit: 2014-02-18 | Discharge: 2014-02-18 | Disposition: A | Discharge status: Home / Self Care | Payer: Medicare Other | Source: Ambulatory Visit | Attending: Radiation Oncology | Admitting: Radiation Oncology

## 2014-02-18 DIAGNOSIS — Z510 Encounter for antineoplastic radiation therapy: Secondary | ICD-10-CM | POA: Diagnosis not present

## 2014-02-18 DIAGNOSIS — C61 Malignant neoplasm of prostate: Secondary | ICD-10-CM | POA: Diagnosis not present

## 2014-02-19 ENCOUNTER — Ambulatory Visit
Admission: RE | Admit: 2014-02-19 | Discharge: 2014-02-19 | Disposition: A | Discharge status: Home / Self Care | Payer: Medicare Other | Source: Ambulatory Visit | Attending: Radiation Oncology | Admitting: Radiation Oncology

## 2014-02-19 DIAGNOSIS — C61 Malignant neoplasm of prostate: Secondary | ICD-10-CM | POA: Diagnosis not present

## 2014-02-19 DIAGNOSIS — Z510 Encounter for antineoplastic radiation therapy: Secondary | ICD-10-CM | POA: Diagnosis not present

## 2014-02-20 ENCOUNTER — Ambulatory Visit
Admission: RE | Admit: 2014-02-20 | Discharge: 2014-02-20 | Disposition: A | Discharge status: Home / Self Care | Payer: Medicare Other | Source: Ambulatory Visit | Attending: Radiation Oncology | Admitting: Radiation Oncology

## 2014-02-20 DIAGNOSIS — C61 Malignant neoplasm of prostate: Secondary | ICD-10-CM | POA: Diagnosis not present

## 2014-02-20 DIAGNOSIS — Z510 Encounter for antineoplastic radiation therapy: Secondary | ICD-10-CM | POA: Diagnosis not present

## 2014-02-23 ENCOUNTER — Ambulatory Visit
Admission: RE | Admit: 2014-02-23 | Discharge: 2014-02-23 | Disposition: A | Discharge status: Home / Self Care | Payer: Medicare Other | Source: Ambulatory Visit | Attending: Radiation Oncology | Admitting: Radiation Oncology

## 2014-02-23 ENCOUNTER — Encounter: Payer: Self-pay | Admitting: Radiation Oncology

## 2014-02-23 VITALS — BP 130/75 | HR 57 | Temp 98.5°F | Resp 18 | Wt 217.5 lb

## 2014-02-23 DIAGNOSIS — C61 Malignant neoplasm of prostate: Secondary | ICD-10-CM

## 2014-02-23 DIAGNOSIS — Z510 Encounter for antineoplastic radiation therapy: Secondary | ICD-10-CM | POA: Diagnosis not present

## 2014-02-23 NOTE — Progress Notes (Signed)
Weekly Management Note:  Site: Prostate bed Current Dose:  5000  cGy Projected Dose: 6600  cGy  Narrative: The patient is seen today for routine under treatment assessment. CBCT/MVCT images/port films were reviewed. The chart was reviewed.   Bladder filling remains excellent.  Doing well from a GU and GI standpoint although he does have difficulty with fatigue.  Physical Examination:  Filed Vitals:   02/23/14 0957  BP: 130/75  Pulse: 57  Temp: 98.5 F (36.9 C)  Resp: 18  .  Weight: 217 lb 8 oz (98.657 kg).  No change.  Impression: Tolerating radiation therapy well.  Plan: Continue radiation therapy as planned.

## 2014-02-23 NOTE — Progress Notes (Signed)
Patient denies pain, urinary/bowel issues, loss of appetite. He reports increasing fatigue. Discussed continuing to exercise and importance of proper hydration, carbs paired with proteins.

## 2014-02-24 ENCOUNTER — Ambulatory Visit
Admission: RE | Admit: 2014-02-24 | Discharge: 2014-02-24 | Disposition: A | Discharge status: Home / Self Care | Payer: Medicare Other | Source: Ambulatory Visit | Attending: Radiation Oncology | Admitting: Radiation Oncology

## 2014-02-24 DIAGNOSIS — Z510 Encounter for antineoplastic radiation therapy: Secondary | ICD-10-CM | POA: Diagnosis not present

## 2014-02-24 DIAGNOSIS — C61 Malignant neoplasm of prostate: Secondary | ICD-10-CM | POA: Diagnosis not present

## 2014-02-25 ENCOUNTER — Ambulatory Visit
Admission: RE | Admit: 2014-02-25 | Discharge: 2014-02-25 | Disposition: A | Discharge status: Home / Self Care | Payer: Medicare Other | Source: Ambulatory Visit | Attending: Radiation Oncology | Admitting: Radiation Oncology

## 2014-02-25 DIAGNOSIS — Z510 Encounter for antineoplastic radiation therapy: Secondary | ICD-10-CM | POA: Diagnosis not present

## 2014-02-25 DIAGNOSIS — C61 Malignant neoplasm of prostate: Secondary | ICD-10-CM | POA: Diagnosis not present

## 2014-02-26 ENCOUNTER — Ambulatory Visit
Admission: RE | Admit: 2014-02-26 | Discharge: 2014-02-26 | Disposition: A | Discharge status: Home / Self Care | Payer: Medicare Other | Source: Ambulatory Visit | Attending: Radiation Oncology | Admitting: Radiation Oncology

## 2014-02-26 DIAGNOSIS — C61 Malignant neoplasm of prostate: Secondary | ICD-10-CM | POA: Diagnosis not present

## 2014-02-26 DIAGNOSIS — Z510 Encounter for antineoplastic radiation therapy: Secondary | ICD-10-CM | POA: Diagnosis not present

## 2014-02-27 ENCOUNTER — Ambulatory Visit: Payer: Medicare Other

## 2014-02-27 ENCOUNTER — Ambulatory Visit
Admission: RE | Admit: 2014-02-27 | Discharge: 2014-02-27 | Disposition: A | Discharge status: Home / Self Care | Payer: Medicare Other | Source: Ambulatory Visit | Attending: Radiation Oncology | Admitting: Radiation Oncology

## 2014-02-27 DIAGNOSIS — Z510 Encounter for antineoplastic radiation therapy: Secondary | ICD-10-CM | POA: Diagnosis not present

## 2014-02-27 DIAGNOSIS — C61 Malignant neoplasm of prostate: Secondary | ICD-10-CM | POA: Diagnosis not present

## 2014-03-02 ENCOUNTER — Encounter: Payer: Self-pay | Admitting: Radiation Oncology

## 2014-03-02 ENCOUNTER — Ambulatory Visit: Payer: Medicare Other

## 2014-03-02 ENCOUNTER — Ambulatory Visit
Admission: RE | Admit: 2014-03-02 | Discharge: 2014-03-02 | Disposition: A | Discharge status: Home / Self Care | Payer: Medicare Other | Source: Ambulatory Visit | Attending: Radiation Oncology | Admitting: Radiation Oncology

## 2014-03-02 VITALS — BP 129/69 | HR 63 | Temp 98.1°F | Resp 20 | Wt 213.4 lb

## 2014-03-02 DIAGNOSIS — C61 Malignant neoplasm of prostate: Secondary | ICD-10-CM

## 2014-03-02 DIAGNOSIS — Z510 Encounter for antineoplastic radiation therapy: Secondary | ICD-10-CM | POA: Diagnosis not present

## 2014-03-02 NOTE — Progress Notes (Signed)
Patient denies pain, loss of appetite, urinary/bowel issues. He continues to be fatigued but not every day. He will complete Thurs; gave him FU card.

## 2014-03-02 NOTE — Progress Notes (Signed)
Weekly Management Note:  Site: prostate bed Current Dose:   6000  cGy Projected Dose:  6600  cGy  Narrative: The patient is seen today for routine under treatment assessment. CBCT/MVCT images/port films were reviewed. The chart was reviewed.    Bladder filling remains excellent.  No GU or GI difficulty.  Physical Examination:  Filed Vitals:   03/02/14 1026  BP: 129/69  Pulse: 63  Temp: 98.1 F (36.7 C)  Resp: 20  .  Weight: 213 lb 6.4 oz (96.798 kg).  No change.  Impression: Tolerating radiation therapy well.  He will finish his treatment this Thursday.  Plan: Continue radiation therapy as planned.  One-month follow-up on completion of radiation therapy.

## 2014-03-03 ENCOUNTER — Ambulatory Visit
Admission: RE | Admit: 2014-03-03 | Discharge: 2014-03-03 | Disposition: A | Discharge status: Home / Self Care | Payer: Medicare Other | Source: Ambulatory Visit | Attending: Radiation Oncology | Admitting: Radiation Oncology

## 2014-03-03 DIAGNOSIS — C61 Malignant neoplasm of prostate: Secondary | ICD-10-CM | POA: Diagnosis not present

## 2014-03-03 DIAGNOSIS — Z510 Encounter for antineoplastic radiation therapy: Secondary | ICD-10-CM | POA: Diagnosis not present

## 2014-03-04 ENCOUNTER — Ambulatory Visit
Admission: RE | Admit: 2014-03-04 | Discharge: 2014-03-04 | Disposition: A | Discharge status: Home / Self Care | Payer: Medicare Other | Source: Ambulatory Visit | Attending: Radiation Oncology | Admitting: Radiation Oncology

## 2014-03-04 DIAGNOSIS — Z510 Encounter for antineoplastic radiation therapy: Secondary | ICD-10-CM | POA: Diagnosis not present

## 2014-03-04 DIAGNOSIS — C61 Malignant neoplasm of prostate: Secondary | ICD-10-CM | POA: Diagnosis not present

## 2014-03-05 ENCOUNTER — Ambulatory Visit
Admission: RE | Admit: 2014-03-05 | Discharge: 2014-03-05 | Disposition: A | Discharge status: Home / Self Care | Payer: Medicare Other | Source: Ambulatory Visit | Attending: Radiation Oncology | Admitting: Radiation Oncology

## 2014-03-05 ENCOUNTER — Ambulatory Visit: Payer: Medicare Other

## 2014-03-05 ENCOUNTER — Encounter: Payer: Self-pay | Admitting: Radiation Oncology

## 2014-03-05 DIAGNOSIS — Z510 Encounter for antineoplastic radiation therapy: Secondary | ICD-10-CM | POA: Diagnosis not present

## 2014-03-05 DIAGNOSIS — C61 Malignant neoplasm of prostate: Secondary | ICD-10-CM | POA: Diagnosis not present

## 2014-03-05 NOTE — Progress Notes (Signed)
Weekly Management Note:  Site: Prostate bed Current Dose:  6600  cGy Projected Dose: 6600  cGy  Narrative: The patient is seen today for routine under treatment assessment. CBCT/MVCT images/port films were reviewed. The chart was reviewed.   Bladder filling is excellent.  No new GU or GI difficulties.  Physical Examination: There were no vitals filed for this visit..  Weight:  .  No change.  Impression: Radiation therapy well tolerated.  Treatment is completed today.  Plan: Follow-up visit in one month.

## 2014-03-05 NOTE — Progress Notes (Signed)
[facility] End of Treatment Note  Name:Brandon Simmons  Date: 03/05/2014 XYI:[id number] DOB:November 14, 1945   Status:outpatient    CC: [name], MD  Dr. Dutch Gray  REFERRING PHYSICIAN: Dr. Dutch Gray    DIAGNOSIS:  PSA recurrent carcinoma the prostate  INDICATION FOR TREATMENT: Curative   TREATMENT DATES: 01/19/2014 through 03/05/2014                          SITE/DOSE: Prostate bed 6600 cGy in 33 sessions                           BEAMS/ENERGY:  6 MV photons with dual ARC VMAT IMRT                 NARRATIVE:   The patient tolerated his treatment beautifully with no significant GU or GI toxicity during or by completion of radiation therapy.                         PLAN: Routine followup in one month. Patient instructed to call if questions or worsening complaints in interim.

## 2014-03-06 ENCOUNTER — Ambulatory Visit: Payer: Medicare Other

## 2014-03-20 HISTORY — PX: CARDIAC CATHETERIZATION: SHX172

## 2014-03-24 DIAGNOSIS — J301 Allergic rhinitis due to pollen: Secondary | ICD-10-CM | POA: Diagnosis not present

## 2014-03-24 DIAGNOSIS — J3089 Other allergic rhinitis: Secondary | ICD-10-CM | POA: Diagnosis not present

## 2014-03-27 ENCOUNTER — Encounter: Payer: Self-pay | Admitting: Radiation Oncology

## 2014-03-30 DIAGNOSIS — E785 Hyperlipidemia, unspecified: Secondary | ICD-10-CM | POA: Diagnosis not present

## 2014-03-30 DIAGNOSIS — Z0000 Encounter for general adult medical examination without abnormal findings: Secondary | ICD-10-CM | POA: Diagnosis not present

## 2014-03-31 ENCOUNTER — Encounter: Payer: Self-pay | Admitting: Radiation Oncology

## 2014-03-31 ENCOUNTER — Ambulatory Visit
Admission: RE | Admit: 2014-03-31 | Discharge: 2014-03-31 | Disposition: A | Discharge status: Home / Self Care | Payer: Medicare Other | Source: Ambulatory Visit | Attending: Radiation Oncology | Admitting: Radiation Oncology

## 2014-03-31 VITALS — BP 144/73 | HR 60 | Temp 98.1°F | Ht 70.0 in | Wt 214.1 lb

## 2014-03-31 DIAGNOSIS — C61 Malignant neoplasm of prostate: Secondary | ICD-10-CM

## 2014-03-31 HISTORY — DX: Personal history of irradiation: Z92.3

## 2014-03-31 NOTE — Progress Notes (Signed)
CC: Dr. Dutch Gray  Follow-up note:  [name] returns today approximately 1 month following completion of radiation therapy/IMRT in the management of his PSA recurrent carcinoma the prostate.  He continues to do well from a GU and GI standpoint.  He will see Dr. [name] for a follow-up visit and PSA determination in March.  His examination: Alert and oriented.  Blood pressure 144/73, pulse 60, temperature 98.1  Rectal examination not performed today.  Impression: Satisfactory progress.  Plan: Follow-up with Dr. [name].  I've not scheduled [name] for a formal follow-up visit and I ask that Dr. [name] keep me posted on his progress.

## 2014-03-31 NOTE — Progress Notes (Signed)
[name] for fu appointment.   He reports that he has " good" urinary control/stream, nocutria 2-3, no hesitancy, nor rectal irritation.  He reports that his stool is hard and having minimal results with the use of metamucil.  He has a hx of Diverticulosis.  Instructed to solicit advice from Dr. Valere Dross in regards to what would assist in alleviating his constipation.  Prostaglandin dose increased to 0.45 since receiving radiation therapy.

## 2014-03-31 NOTE — Addendum Note (Signed)
Encounter addended by: Deirdre Evener, RN on: 03/31/2014  6:37 PM<BR>     Documentation filed: Vitals Section, Notes Section

## 2014-04-03 DIAGNOSIS — Z0000 Encounter for general adult medical examination without abnormal findings: Secondary | ICD-10-CM | POA: Diagnosis not present

## 2014-04-03 DIAGNOSIS — C61 Malignant neoplasm of prostate: Secondary | ICD-10-CM | POA: Diagnosis not present

## 2014-04-03 DIAGNOSIS — K219 Gastro-esophageal reflux disease without esophagitis: Secondary | ICD-10-CM | POA: Diagnosis not present

## 2014-04-03 DIAGNOSIS — Z1389 Encounter for screening for other disorder: Secondary | ICD-10-CM | POA: Diagnosis not present

## 2014-04-03 DIAGNOSIS — N182 Chronic kidney disease, stage 2 (mild): Secondary | ICD-10-CM | POA: Diagnosis not present

## 2014-04-03 DIAGNOSIS — H6123 Impacted cerumen, bilateral: Secondary | ICD-10-CM | POA: Diagnosis not present

## 2014-04-03 DIAGNOSIS — Z6831 Body mass index (BMI) 31.0-31.9, adult: Secondary | ICD-10-CM | POA: Diagnosis not present

## 2014-04-03 DIAGNOSIS — J45909 Unspecified asthma, uncomplicated: Secondary | ICD-10-CM | POA: Diagnosis not present

## 2014-04-03 DIAGNOSIS — E785 Hyperlipidemia, unspecified: Secondary | ICD-10-CM | POA: Diagnosis not present

## 2014-04-13 DIAGNOSIS — J3089 Other allergic rhinitis: Secondary | ICD-10-CM | POA: Diagnosis not present

## 2014-04-13 DIAGNOSIS — J301 Allergic rhinitis due to pollen: Secondary | ICD-10-CM | POA: Diagnosis not present

## 2014-04-13 DIAGNOSIS — J4520 Mild intermittent asthma, uncomplicated: Secondary | ICD-10-CM | POA: Diagnosis not present

## 2014-04-20 DIAGNOSIS — J3089 Other allergic rhinitis: Secondary | ICD-10-CM | POA: Diagnosis not present

## 2014-04-20 DIAGNOSIS — J301 Allergic rhinitis due to pollen: Secondary | ICD-10-CM | POA: Diagnosis not present

## 2014-04-29 ENCOUNTER — Telehealth: Payer: Self-pay | Admitting: Cardiology

## 2014-04-29 NOTE — Telephone Encounter (Signed)
Received records from [facility] (Dr Leanna Battles) for appointment on 05/05/14 with Dr Percival Spanish.  Records given to [facility] (medical records) for Dr Hochrein's schedule on 05/05/14. lp

## 2014-05-05 ENCOUNTER — Encounter: Payer: Self-pay | Admitting: Cardiology

## 2014-05-05 ENCOUNTER — Ambulatory Visit (INDEPENDENT_AMBULATORY_CARE_PROVIDER_SITE_OTHER): Payer: Medicare Other | Admitting: Cardiology

## 2014-05-05 VITALS — BP 140/80 | HR 58 | Ht 70.0 in | Wt 216.3 lb

## 2014-05-05 DIAGNOSIS — R002 Palpitations: Secondary | ICD-10-CM

## 2014-05-05 DIAGNOSIS — R0789 Other chest pain: Secondary | ICD-10-CM | POA: Diagnosis not present

## 2014-05-05 DIAGNOSIS — Z789 Other specified health status: Secondary | ICD-10-CM

## 2014-05-05 DIAGNOSIS — Z8249 Family history of ischemic heart disease and other diseases of the circulatory system: Secondary | ICD-10-CM

## 2014-05-05 DIAGNOSIS — R0600 Dyspnea, unspecified: Secondary | ICD-10-CM

## 2014-05-05 DIAGNOSIS — R079 Chest pain, unspecified: Secondary | ICD-10-CM

## 2014-05-05 NOTE — Patient Instructions (Signed)
Your physician recommends that you schedule a follow-up appointment in: one year with Dr. Percival Spanish  We are ordering a stress test for you to get done  We are ordering a Ct calcium score for you to get done

## 2014-05-05 NOTE — Progress Notes (Signed)
Cardiology Office Note   Date:  05/05/2014   ID:  Christopher Glasscock, DOB 1945/09/22, MRN [id number]  PCP:  [name], MD  Cardiologist:   Minus Breeding, MD   Chief Complaint  Patient presents with  . Shortness of Breath      History of Present Illness: Brandon Simmons is a 69 y.o. male who presents for evaluation of dyspnea. He has had no prior cardiac history. He does have a family history of early onset heart disease. He's had a treadmill test some years ago. He's had no other prior cardiac workup. He is active. He exercises routinely. However, occasionally he gets some chest discomfort. He has a sensation in his mid chest sometimes accompanied by burping. It might happen with activities. It will go away relatively quickly. He might get dyspneic walking up many stairs or rarely doing less activity. However, this is not reproducible. He's able to walk 2 miles briskly routinely without typically bringing on any symptoms. He doesn't describe any PND or orthopnea. He doesn't have any palpitations, presyncope or syncope. He has had no weight gain or edema.   Past Medical History  Diagnosis Date  . Hemorrhoids   . Diverticulosis   . Prostatitis   . Asthma   . GERD (gastroesophageal reflux disease)   . Organic erectile dysfunction   . Diverticulitis   . Arthritis   . Prostate cancer 04/25/2006    Gleason 3+4=7  . S/P radiation therapy 01/19/2014 through 03/05/2014                                                       Prostate bed 6600 cGy in 33 sessions                          . Hyperlipemia     Past Surgical History  Procedure Laterality Date  . Prostatectomy  04/25/2006  . Knee arthroscopy      R  . Larynx surgery      vocal cord lesion- benign  . Appendectomy      1967  . Colonoscopy    . Polypectomy    . Cataract surgery Left      Current Outpatient Prescriptions  Medication Sig Dispense Refill  . albuterol (PROVENTIL HFA;VENTOLIN HFA) 108 (90  BASE) MCG/ACT inhaler Inhale 2 puffs into the lungs every 6 (six) hours as needed.    . Alprostadil (PROSTAGLANDIN E1) POWD by Does not apply route as needed.    Mar[place] Kitchen aspirin 81 MG tablet Take 81 mg by mouth daily.    Mar[place] Kitchen atorvastatin (LIPITOR) 40 MG tablet Take 20 mg by mouth daily.     Mar[place] Kitchen Bioflavonoid Products (VITAMIN C PLUS PO) Take 1 tablet by mouth daily.    . cholecalciferol (VITAMIN D) 1000 UNITS tablet Take 1,000 Units by mouth daily.    . fluticasone (FLONASE) 50 MCG/ACT nasal spray Place 1 spray into the nose as needed.    Mar[place] Kitchen HYDROcodone-acetaminophen (NORCO) 5-325 MG per tablet Take 1 tablet by mouth every 6 (six) hours as needed. 2.5-162.5 as needed    . L-Lysine 500 MG TABS Take 1 tablet by mouth daily.    Mar[place] Kitchen LORazepam (ATIVAN) 0.5 MG tablet Take 0.5 mg by mouth every 8 (eight) hours as needed for anxiety.    Marland Kitchen  meloxicam (MOBIC) 15 MG tablet Take 7.5 mg by mouth daily.     . Multiple Vitamin (MULTIVITAMIN) capsule Take 1 capsule by mouth daily.    Mar[place] Kitchen omeprazole (PRILOSEC) 20 MG capsule Take 20 mg by mouth 2 times daily at 12 noon and 4 pm.     . predniSONE (DELTASONE) 10 MG tablet Take 10 mg by mouth as needed.     . Probiotic Product (ALIGN) 4 MG CAPS Take by mouth 1 day or 1 dose.    . psyllium (REGULOID) 0.52 G capsule Take 0.52 g by mouth 2 (two) times daily.    Mar[place] Kitchen zolpidem (AMBIEN) 10 MG tablet Take 10 mg by mouth at bedtime as needed for sleep.     No current facility-administered medications for this visit.    Allergies:   Review of patient's allergies indicates no known allergies.    Social History:  The patient  reports that he has quit smoking. His smoking use included Cigarettes. He has a 10 pack-year smoking history. He has quit using smokeless tobacco. He reports that he drinks about 4.2 oz of alcohol per week. He reports that he does not use illicit drugs.   Family History:  The patient's family history includes Atrial fibrillation in his son; Diabetes in his maternal  grandmother; Emphysema in his father; Heart disease in his maternal grandfather and paternal grandfather; Hypertension in his mother; Liver cancer in his paternal grandmother and another family member; Lung cancer in his father and another family member.    ROS:  Please see the history of present illness.   Otherwise, review of systems are positive for lumbar spine injury, reflux..   All other systems are reviewed and negative.    PHYSICAL EXAM: VS:  BP 140/80 mmHg  Pulse 58  Ht 5\' 10"  (1.778 m)  Wt 216 lb 4.8 oz (98.113 kg)  BMI 31.04 kg/m2 , BMI Body mass index is 31.04 kg/(m^2). GEN: Well nourished, well developed, in no acute distress HEENT: normal Neck: no JVD, carotid bruits, or masses Cardiac: RRR; no murmurs, rubs, or gallops,no edema  Respiratory:  clear to auscultation bilaterally, normal work of breathing GI: soft, nontender, nondistended, + BS MS: no deformity or atrophy Skin: warm and dry, no rash Neuro:  Strength and sensation are intact Psych: euthymic mood, full affect   EKG:  EKG is ordered today. The ekg ordered today demonstrates Sinus rhythm, rate 58, axis within normal limits, intervals within normal limits, no acute ST-T wave changes.    Recent Labs: No results found for requested labs within last 365 days.    Lipid Panel No results found for: CHOL, TRIG, HDL, CHOLHDL, VLDL, LDLCALC, LDLDIRECT    Wt Readings from Last 3 Encounters:  05/05/14 216 lb 4.8 oz (98.113 kg)  03/02/14 213 lb 6.4 oz (96.798 kg)  02/16/14 215 lb 9.6 oz (97.796 kg)      Other studies Reviewed: Additional studies/ records that were reviewed today include: Outside records. Review of the above records demonstrates:   As recorded elsewhere   ASSESSMENT AND PLAN:  DYPSNEA/CHEST PAIN:  This is atypical. The pretest probability of obstructive coronary disease is low. I will bring the patient back for a POET (Plain Old Exercise Test). This will allow me to screen for obstructive  coronary disease, risk stratify and very importantly provide a prescription for exercise.  I will also bring him back for a coronary calcium score.  OVERWEIGHT:  The patient understands the need to lose weight with  diet and exercise. We have discussed specific strategies for this.   Current medicines are reviewed at length with the patient today.  The patient does not have concerns regarding medicines.  The following changes have been made:  no change  Labs/ tests ordered today include: POET (Plain Old Exercise Treadmill).  Coronary calcium score.   Orders Placed This Encounter  Procedures  . EKG 12-Lead  . Exercise tolerance test     Disposition:   FU with me as needed   Signed, Minus Breeding, MD  05/05/2014 2:03 PM    [place]

## 2014-05-07 DIAGNOSIS — Z1212 Encounter for screening for malignant neoplasm of rectum: Secondary | ICD-10-CM | POA: Diagnosis not present

## 2014-05-18 ENCOUNTER — Ambulatory Visit: Payer: Medicare Other | Admitting: Physician Assistant

## 2014-05-18 ENCOUNTER — Other Ambulatory Visit: Payer: Medicare Other

## 2014-05-18 ENCOUNTER — Ambulatory Visit (INDEPENDENT_AMBULATORY_CARE_PROVIDER_SITE_OTHER)
Admission: RE | Admit: 2014-05-18 | Discharge: 2014-05-18 | Disposition: A | Discharge status: Home / Self Care | Payer: Medicare Other | Source: Ambulatory Visit | Attending: Cardiology | Admitting: Cardiology

## 2014-05-18 ENCOUNTER — Encounter: Payer: Medicare Other | Admitting: Physician Assistant

## 2014-05-18 DIAGNOSIS — R0600 Dyspnea, unspecified: Secondary | ICD-10-CM

## 2014-05-18 DIAGNOSIS — R002 Palpitations: Secondary | ICD-10-CM

## 2014-05-18 DIAGNOSIS — R0789 Other chest pain: Secondary | ICD-10-CM

## 2014-05-18 DIAGNOSIS — Z8249 Family history of ischemic heart disease and other diseases of the circulatory system: Secondary | ICD-10-CM

## 2014-05-18 NOTE — Progress Notes (Signed)
Exercise Treadmill Test  Pre-Exercise Testing Evaluation Rhythm: sinus bradycardia  Rate: 53     Test  Exercise Tolerance Test Ordering MD: Marijo File, MD  Interpreting MD: Richardson Dopp, PA-C  Unique Test No: 1  Treadmill:  1  Indication for ETT: exertional dyspnea  Contraindication to ETT: No   Stress Modality: exercise - treadmill  Cardiac Imaging Performed: non   Protocol: standard Bruce - maximal  Max BP:  179/69  Max MPHR (bpm):  151 85% MPR (bpm):  128  MPHR obtained (bpm):  134 % MPHR obtained:  88  Reached 85% MPHR (min:sec):  9:10 Total Exercise Time (min-sec):  9:30  Workload in METS:  11.1 Borg Scale: 15  Reason ETT Terminated:  desired heart rate attained    ST Segment Analysis At Rest: normal ST segments - no evidence of significant ST depression With Exercise: non-specific ST changes  Other Information Arrhythmia:  2 episodes of brief (3 beats) SVT at peak exercise Angina during ETT:  absent (0) Quality of ETT:  diagnostic  ETT Interpretation:  normal - no evidence of ischemia by ST analysis  Comments: Good exercise capacity. No chest pain. Normal BP response to exercise. There was < 26mm up-sloping ST depression in lead 2 at peak exercise.  Reviewed ECGs with Dr. Dorris Carnes (DOD) who agreed there were no changes suggestive of ischemia. 2 brief episodes of SVT (3 beats) at peak exercise. Good HR recovery in 1st minute post exercise (2 mph on 2% grade).  Recommendations: FU with Dr. Minus Breeding as planned.  Signed,  Richardson Dopp, PA-C   05/18/2014 11:21 AM

## 2014-05-19 DIAGNOSIS — J301 Allergic rhinitis due to pollen: Secondary | ICD-10-CM | POA: Diagnosis not present

## 2014-05-19 DIAGNOSIS — J3089 Other allergic rhinitis: Secondary | ICD-10-CM | POA: Diagnosis not present

## 2014-05-22 DIAGNOSIS — C61 Malignant neoplasm of prostate: Secondary | ICD-10-CM | POA: Diagnosis not present

## 2014-05-25 ENCOUNTER — Encounter: Payer: Self-pay | Admitting: Cardiology

## 2014-05-26 DIAGNOSIS — J3089 Other allergic rhinitis: Secondary | ICD-10-CM | POA: Diagnosis not present

## 2014-05-26 DIAGNOSIS — J301 Allergic rhinitis due to pollen: Secondary | ICD-10-CM | POA: Diagnosis not present

## 2014-05-27 ENCOUNTER — Telehealth: Payer: Self-pay | Admitting: Cardiology

## 2014-05-27 NOTE — Telephone Encounter (Signed)
Pt. Called LMTCB

## 2014-05-27 NOTE — Telephone Encounter (Signed)
Pt would like his stress test and ct scan results from 05-18-14 please.

## 2014-05-27 NOTE — Telephone Encounter (Signed)
Returning your call. °

## 2014-05-29 DIAGNOSIS — C61 Malignant neoplasm of prostate: Secondary | ICD-10-CM | POA: Diagnosis not present

## 2014-05-29 DIAGNOSIS — N529 Male erectile dysfunction, unspecified: Secondary | ICD-10-CM | POA: Diagnosis not present

## 2014-05-29 NOTE — Telephone Encounter (Signed)
Pt informed about his stress test which was normal

## 2014-05-29 NOTE — Telephone Encounter (Signed)
i'm waiting for the stress test to be resulted

## 2014-05-29 NOTE — Telephone Encounter (Signed)
Pt. Informed about his stress test

## 2014-06-08 DIAGNOSIS — L300 Nummular dermatitis: Secondary | ICD-10-CM | POA: Diagnosis not present

## 2014-06-08 DIAGNOSIS — L820 Inflamed seborrheic keratosis: Secondary | ICD-10-CM | POA: Diagnosis not present

## 2014-06-08 DIAGNOSIS — L821 Other seborrheic keratosis: Secondary | ICD-10-CM | POA: Diagnosis not present

## 2014-06-22 DIAGNOSIS — J3089 Other allergic rhinitis: Secondary | ICD-10-CM | POA: Diagnosis not present

## 2014-06-22 DIAGNOSIS — J301 Allergic rhinitis due to pollen: Secondary | ICD-10-CM | POA: Diagnosis not present

## 2014-06-30 DIAGNOSIS — J301 Allergic rhinitis due to pollen: Secondary | ICD-10-CM | POA: Diagnosis not present

## 2014-06-30 DIAGNOSIS — J3089 Other allergic rhinitis: Secondary | ICD-10-CM | POA: Diagnosis not present

## 2014-07-02 DIAGNOSIS — J3089 Other allergic rhinitis: Secondary | ICD-10-CM | POA: Diagnosis not present

## 2014-07-02 DIAGNOSIS — J301 Allergic rhinitis due to pollen: Secondary | ICD-10-CM | POA: Diagnosis not present

## 2014-07-13 DIAGNOSIS — J3089 Other allergic rhinitis: Secondary | ICD-10-CM | POA: Diagnosis not present

## 2014-07-13 DIAGNOSIS — J301 Allergic rhinitis due to pollen: Secondary | ICD-10-CM | POA: Diagnosis not present

## 2014-07-15 DIAGNOSIS — J301 Allergic rhinitis due to pollen: Secondary | ICD-10-CM | POA: Diagnosis not present

## 2014-07-20 DIAGNOSIS — J3089 Other allergic rhinitis: Secondary | ICD-10-CM | POA: Diagnosis not present

## 2014-07-20 DIAGNOSIS — J301 Allergic rhinitis due to pollen: Secondary | ICD-10-CM | POA: Diagnosis not present

## 2014-07-22 DIAGNOSIS — J301 Allergic rhinitis due to pollen: Secondary | ICD-10-CM | POA: Diagnosis not present

## 2014-07-22 DIAGNOSIS — J3089 Other allergic rhinitis: Secondary | ICD-10-CM | POA: Diagnosis not present

## 2014-08-19 DIAGNOSIS — J301 Allergic rhinitis due to pollen: Secondary | ICD-10-CM | POA: Diagnosis not present

## 2014-08-19 DIAGNOSIS — J3089 Other allergic rhinitis: Secondary | ICD-10-CM | POA: Diagnosis not present

## 2014-08-31 DIAGNOSIS — C61 Malignant neoplasm of prostate: Secondary | ICD-10-CM | POA: Diagnosis not present

## 2014-09-04 DIAGNOSIS — H3531 Nonexudative age-related macular degeneration: Secondary | ICD-10-CM | POA: Diagnosis not present

## 2014-09-04 DIAGNOSIS — H25811 Combined forms of age-related cataract, right eye: Secondary | ICD-10-CM | POA: Diagnosis not present

## 2014-09-04 DIAGNOSIS — Z961 Presence of intraocular lens: Secondary | ICD-10-CM | POA: Diagnosis not present

## 2014-09-04 DIAGNOSIS — H524 Presbyopia: Secondary | ICD-10-CM | POA: Diagnosis not present

## 2014-09-14 ENCOUNTER — Other Ambulatory Visit: Payer: Self-pay

## 2014-09-17 DIAGNOSIS — J301 Allergic rhinitis due to pollen: Secondary | ICD-10-CM | POA: Diagnosis not present

## 2014-09-17 DIAGNOSIS — J3089 Other allergic rhinitis: Secondary | ICD-10-CM | POA: Diagnosis not present

## 2014-10-13 DIAGNOSIS — J3089 Other allergic rhinitis: Secondary | ICD-10-CM | POA: Diagnosis not present

## 2014-10-13 DIAGNOSIS — J301 Allergic rhinitis due to pollen: Secondary | ICD-10-CM | POA: Diagnosis not present

## 2014-11-10 DIAGNOSIS — J301 Allergic rhinitis due to pollen: Secondary | ICD-10-CM | POA: Diagnosis not present

## 2014-11-10 DIAGNOSIS — J3089 Other allergic rhinitis: Secondary | ICD-10-CM | POA: Diagnosis not present

## 2014-12-08 DIAGNOSIS — J301 Allergic rhinitis due to pollen: Secondary | ICD-10-CM | POA: Diagnosis not present

## 2014-12-08 DIAGNOSIS — J3089 Other allergic rhinitis: Secondary | ICD-10-CM | POA: Diagnosis not present

## 2014-12-09 DIAGNOSIS — C61 Malignant neoplasm of prostate: Secondary | ICD-10-CM | POA: Diagnosis not present

## 2014-12-12 DIAGNOSIS — Z23 Encounter for immunization: Secondary | ICD-10-CM | POA: Diagnosis not present

## 2014-12-16 DIAGNOSIS — C61 Malignant neoplasm of prostate: Secondary | ICD-10-CM | POA: Diagnosis not present

## 2014-12-16 DIAGNOSIS — N529 Male erectile dysfunction, unspecified: Secondary | ICD-10-CM | POA: Diagnosis not present

## 2014-12-31 DIAGNOSIS — J029 Acute pharyngitis, unspecified: Secondary | ICD-10-CM | POA: Diagnosis not present

## 2014-12-31 DIAGNOSIS — J302 Other seasonal allergic rhinitis: Secondary | ICD-10-CM | POA: Diagnosis not present

## 2014-12-31 DIAGNOSIS — J301 Allergic rhinitis due to pollen: Secondary | ICD-10-CM | POA: Diagnosis not present

## 2014-12-31 DIAGNOSIS — J3089 Other allergic rhinitis: Secondary | ICD-10-CM | POA: Diagnosis not present

## 2014-12-31 DIAGNOSIS — J45909 Unspecified asthma, uncomplicated: Secondary | ICD-10-CM | POA: Diagnosis not present

## 2015-01-05 DIAGNOSIS — Z8546 Personal history of malignant neoplasm of prostate: Secondary | ICD-10-CM | POA: Diagnosis not present

## 2015-01-05 DIAGNOSIS — I4891 Unspecified atrial fibrillation: Secondary | ICD-10-CM | POA: Diagnosis not present

## 2015-01-05 DIAGNOSIS — I351 Nonrheumatic aortic (valve) insufficiency: Secondary | ICD-10-CM | POA: Diagnosis not present

## 2015-01-05 DIAGNOSIS — R079 Chest pain, unspecified: Secondary | ICD-10-CM | POA: Diagnosis not present

## 2015-01-05 DIAGNOSIS — R002 Palpitations: Secondary | ICD-10-CM | POA: Diagnosis not present

## 2015-01-05 DIAGNOSIS — E7800 Pure hypercholesterolemia, unspecified: Secondary | ICD-10-CM | POA: Diagnosis not present

## 2015-01-05 DIAGNOSIS — I209 Angina pectoris, unspecified: Secondary | ICD-10-CM | POA: Diagnosis not present

## 2015-01-05 DIAGNOSIS — I200 Unstable angina: Secondary | ICD-10-CM | POA: Diagnosis not present

## 2015-01-05 DIAGNOSIS — I7789 Other specified disorders of arteries and arterioles: Secondary | ICD-10-CM | POA: Diagnosis not present

## 2015-01-07 DIAGNOSIS — E785 Hyperlipidemia, unspecified: Secondary | ICD-10-CM | POA: Diagnosis not present

## 2015-01-07 DIAGNOSIS — J45909 Unspecified asthma, uncomplicated: Secondary | ICD-10-CM | POA: Diagnosis not present

## 2015-01-07 DIAGNOSIS — Z6831 Body mass index (BMI) 31.0-31.9, adult: Secondary | ICD-10-CM | POA: Diagnosis not present

## 2015-01-07 DIAGNOSIS — I4891 Unspecified atrial fibrillation: Secondary | ICD-10-CM | POA: Diagnosis not present

## 2015-01-07 DIAGNOSIS — N182 Chronic kidney disease, stage 2 (mild): Secondary | ICD-10-CM | POA: Diagnosis not present

## 2015-01-13 NOTE — Progress Notes (Addendum)
Cardiology Office Note   Date:  01/14/2015   ID:  Brandon Simmons, DOB 01/26/1946, MRN [id number]  PCP:  [name], MD  Cardiologist:  Dr. Minus Breeding   Electrophysiologist:  n/a  Chief Complaint  Patient presents with  . Atrial Fibrillation     History of Present Illness: Brandon Simmons is a 69 y.o. male with a hx of prostate CA, HL.  He was seen by Dr. Minus Breeding in 2/16 for evaluation of dyspnea. ETT was arranged and demonstrated no ischemic changes.    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 He was recently in [place] woke up suddenly with palpitations and chest pain. He was admitted to [facility]. He had AF with RVR. He converted on rate controlling therapy. He tells me he had an echocardiogram as well as cardiac catheterization. He tells me these were both normal. He was placed on Toprol-XL 50 mg daily. He followed up with primary care recently. As he was fatigued, his dose was changed to 25 mg daily. He continues to feel fatigued from the beta blocker. He denies chest pain, shortness of breath, syncope, orthopnea, PND or edema.   Studies/Reports Reviewed Today:  ETT 2/16 ETT Interpretation: normal - no evidence of ischemia by ST analysis  Cardiac Ca Score 2/16 IMPRESSION: 1. Coronary calcium score of 0. This was 0 percentile for age and sex matched control.  2. 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 If clinically indicated, a dedicated chest CT should be considered.   Past Medical History  Diagnosis Date  . Hemorrhoids   . Diverticulosis   . Prostatitis   . Asthma   . GERD (gastroesophageal reflux disease)   . Organic erectile dysfunction   . Diverticulitis   . Arthritis   . Prostate cancer ([place]) 04/25/2006    Gleason 3+4=7  . S/P radiation therapy 01/19/2014 through 03/05/2014                                                       Prostate bed  6600 cGy in 33 sessions                          . Hyperlipemia   . PAF (paroxysmal atrial fibrillation) [facility])     Past Surgical History  Procedure Laterality Date  . Prostatectomy  04/25/2006  . Knee arthroscopy      R  . Larynx surgery      vocal cord lesion- benign  . Appendectomy      1967  . Colonoscopy    . Polypectomy    . Cataract surgery Left      Current Outpatient Prescriptions  Medication Sig Dispense Refill  . albuterol (PROVENTIL HFA;VENTOLIN HFA) 108 (90 BASE) MCG/ACT inhaler Inhale 2 puffs into the lungs every 6 (six) hours as needed for wheezing or shortness of breath.     . Alprostadil (PROSTAGLANDIN E1) POWD 0.65 mLs by Does not apply route as needed (FOR ERECTILE DYSFUNCTION).     Marland Kitchen aspirin 81 MG tablet Take 81 mg by mouth daily.    Marland Kitchen atorvastatin (LIPITOR) 40 MG tablet Take 20 mg by mouth daily.     . Bioflavonoid Products (VITAMIN C  PLUS PO) Take 1 tablet by mouth daily.    . cholecalciferol (VITAMIN D) 1000 UNITS tablet Take 1,000 Units by mouth daily.    . fluticasone (FLONASE) 50 MCG/ACT nasal spray Place 1 spray into the nose as needed.    Marland Kitchen HYDROcodone-acetaminophen (NORCO) 5-325 MG per tablet Take 1 tablet by mouth every 6 (six) hours as needed for severe pain. 2.5-162.5 as needed    . L-Lysine 500 MG TABS Take 1 tablet by mouth daily.    Marland Kitchen LORazepam (ATIVAN) 0.5 MG tablet Take 0.5 mg by mouth every 8 (eight) hours as needed for anxiety.    . meloxicam (MOBIC) 15 MG tablet Take 7.5 mg by mouth daily.     . metoprolol tartrate (LOPRESSOR) 25 MG tablet Take 12.5 mg by mouth 2 (two) times daily.    . Multiple Vitamin (MULTIVITAMIN) capsule Take 1 capsule by mouth daily.    Marland Kitchen omeprazole (PRILOSEC) 20 MG capsule Take 20 mg by mouth 2 times daily at 12 noon and 4 pm.     . predniSONE (DELTASONE) 10 MG tablet Take 10 mg by mouth as needed (FOR ASTHMA).     . Probiotic Product (ALIGN) 4 MG CAPS Take by mouth 1 day or 1 dose.    . psyllium (REGULOID) 0.52 G  capsule Take 0.52 g by mouth 2 (two) times daily.    Marland Kitchen zolpidem (AMBIEN) 10 MG tablet Take 10 mg by mouth at bedtime as needed for sleep.     No current facility-administered medications for this visit.    Allergies:   Review of patient's allergies indicates no known allergies.    Social History:   Social History   Social History  . Marital Status: Married    Spouse Name: N/A  . Number of Children: 2  . Years of Education: N/A   Occupational History  . 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 Smoking status: Former Smoker -- 1.00 packs/day for 10 years    Types: Cigarettes  . Smokeless tobacco: Former Systems developer     Comment: 1972  . Alcohol Use: 4.2 oz/week    7 Glasses of wine per week     Comment: 1 glass wine or beer daily  . Drug Use: No  . Sexual Activity: Not Asked   Other Topics Concern  . None   Social History Narrative   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  Consultant for schools.   Travels over seas      Family History:   Family History  Problem Relation Age of Onset  . Liver cancer Paternal Grandmother   . Heart disease Paternal Grandfather     Died age 4  . Heart disease Maternal Grandfather     Died age 47  . Diabetes Maternal Grandmother   . Emphysema Father   . Lung cancer Father   . Liver cancer    . Lung cancer    . Hypertension Mother   . Atrial fibrillation Son       ROS:   Please see the history of present illness.   Review of Systems  Cardiovascular: Positive for chest pain and irregular heartbeat.  All other systems reviewed and are negative.     PHYSICAL EXAM: VS:  BP 118/80 mmHg  Pulse 56  Ht 5\' 10"  (1.778 m)  Wt 213 lb 12.8 oz (96.979 kg)  BMI 30.68 kg/m2    Wt Readings from Last 3 Encounters:  01/14/15 213  lb 12.8 oz (96.979 kg)  05/05/14 216 lb 4.8 oz (98.113 kg)  03/31/14 214 lb 1.6 oz (97.115 kg)     GEN: Well nourished, well developed, in no acute distress HEENT: normal Neck: no JVD, no carotid  bruits, no masses Cardiac:  Normal S1/S2, RRR; no murmur ,  no rubs or gallops, no edema   Respiratory:  clear to auscultation bilaterally, no wheezing, rhonchi or rales. GI: soft, nontender, nondistended, + BS MS: no deformity or atrophy Skin: warm and dry  Neuro:  CNs II-XII intact, Strength and sensation are intact Psych: Normal affect   EKG:  EKG is ordered today.  It demonstrates:   Sinus bradycardia, HR 56, normal axis, QTc 382 ms   Recent Labs: No results found for requested labs within last 365 days.    Lipid Panel No results found for: CHOL, TRIG, HDL, CHOLHDL, VLDL, LDLCALC, LDLDIRECT    ASSESSMENT AND PLAN:  1. PAF: Patient was recently admitted to [facility] in [place] with AF with RVR. He was quite symptomatic with this. He had a calcium score earlier this year that was 0. He tells me he had a cardiac catheterization and echocardiogram while admitted that were reportedly normal. He has had some fatigue with beta blocker therapy.   -  I will adjust his Toprol-XL to 12.5 mg twice a day to see if this helps. If he continues to feel fatigued, we could consider switching him to pindolol.   -  I reviewed his case today with Dr. Radford Pax. I will try to obtain his records from [facility]. As long as his cardiac catheterization does not demonstrate any obstructive coronary disease and his echocardiogram does not demonstrate any structural heart disease, I will place him on flecainide 50 mg twice a day. He will need a follow-up ETT 1-2 weeks after starting flecainide.   -  If no TSH done at [facility], he will need a TSH as well.    -  He does have a history of snoring. I will also arrange a sleep study to rule out sleep apnea.   -  He does have a family history of atrial fibrillation. I think he would benefit from being followed in the atrial fibrillation clinic. I will set him up for follow-up in the next 4-6 weeks.    Medication Changes: Current medicines are  reviewed at length with the patient today.  Concerns regarding medicines are as outlined above.  The following changes have been made:   Discontinued Medications   No medications on file   Modified Medications   No medications on file   New Prescriptions   No medications on file   Labs/ tests ordered today include:   Orders Placed This Encounter  Procedures  . EKG 12-Lead  . Split night study     Disposition:    Refer to AFib Clinic in 4-6 weeks.     Signed, Versie Starks, MHS 01/14/2015 5:24 PM    [facility] [place], [place], [place]  [address] Phone: [phone]; Fax: [phone]

## 2015-01-14 ENCOUNTER — Encounter: Payer: Self-pay | Admitting: Physician Assistant

## 2015-01-14 ENCOUNTER — Ambulatory Visit (INDEPENDENT_AMBULATORY_CARE_PROVIDER_SITE_OTHER): Payer: Medicare Other | Admitting: Physician Assistant

## 2015-01-14 VITALS — BP 118/80 | HR 56 | Ht 70.0 in | Wt 213.8 lb

## 2015-01-14 DIAGNOSIS — R0683 Snoring: Secondary | ICD-10-CM

## 2015-01-14 DIAGNOSIS — I4891 Unspecified atrial fibrillation: Secondary | ICD-10-CM

## 2015-01-14 DIAGNOSIS — I480 Paroxysmal atrial fibrillation: Secondary | ICD-10-CM | POA: Diagnosis not present

## 2015-01-14 NOTE — Patient Instructions (Signed)
Medication Instructions:  1. DECREASE TOPROL XL TO 12.5 MG TWICE DAILY  Labwork: NONE  Testing/Procedures: Your physician has recommended that you have a SPLIT NIGHT sleep study. This test records several body functions during sleep, including: brain activity, eye movement, oxygen and carbon dioxide blood levels, heart rate and rhythm, breathing rate and rhythm, the flow of air through your mouth and nose, snoring, body muscle movements, and chest and belly movement.   Follow-Up: YOU ARE BEING REFERRED TO THE A-FIB CLINIC TO BE SEEN IN THE NEXT 4-6 WEEKS PER SCOTT WEAVER, PAC  Any Other Special Instructions Will Be Listed Below (If Applicable).  WE WILL OBTAIN RECORDS FROM YOUR HOSPITALIZATION IN [place]   If you need a refill on your cardiac medications before your next appointment, please call your pharmacy.

## 2015-01-22 ENCOUNTER — Telehealth: Payer: Self-pay | Admitting: Physician Assistant

## 2015-01-22 DIAGNOSIS — I480 Paroxysmal atrial fibrillation: Secondary | ICD-10-CM

## 2015-01-22 NOTE — Telephone Encounter (Signed)
NEW MESSAGE   Pt wants to know if you have received records from [facility] in [place]   He states Brandon Simmons was suppose to look them over and tell pt what medication he soul start

## 2015-01-22 NOTE — Telephone Encounter (Signed)
Lmptcb to let pt know that we have not yet received records for [facility] from [place].

## 2015-01-25 ENCOUNTER — Telehealth: Payer: Self-pay | Admitting: Physician Assistant

## 2015-01-25 NOTE — Telephone Encounter (Signed)
Records rec from [facility] gave to Carol/Scott W

## 2015-01-26 ENCOUNTER — Encounter: Payer: Self-pay | Admitting: Physician Assistant

## 2015-01-26 MED ORDER — FLECAINIDE ACETATE 50 MG PO TABS: 50.0000 mg | ORAL_TABLET | Freq: Two times a day (BID) | ORAL | Status: DC

## 2015-01-26 NOTE — Telephone Encounter (Signed)
Just reviewed records from [facility].  Please have patient start Flecainide 50 mg Twice daily.  Arrange plain ETT (POET) for 1-2 weeks after 1st dose of Flecainide.  Richardson Dopp, PA-C   01/26/2015 5:10 PM

## 2015-01-26 NOTE — Telephone Encounter (Signed)
I called pt to let him know that we did receive records from [facility] in [place], [place]. I did advise PA has not yet finsihing reveiwing records though once done I will let him know if we will make any changes to medication. Pt thanked me.

## 2015-01-26 NOTE — Telephone Encounter (Signed)
Pt has been notified per Brynda Rim. PA reveiwing records to start Flecainide 50 mg BID, GXT 1-2 weeks after 1st start of Flecainide which pt states he will start 11/9. Will have [facility] schedule and call pt with date and time for GXT. Pt said thank you.

## 2015-01-28 DIAGNOSIS — J3089 Other allergic rhinitis: Secondary | ICD-10-CM | POA: Diagnosis not present

## 2015-01-28 DIAGNOSIS — J301 Allergic rhinitis due to pollen: Secondary | ICD-10-CM | POA: Diagnosis not present

## 2015-02-03 ENCOUNTER — Telehealth: Payer: Self-pay | Admitting: Physician Assistant

## 2015-02-03 DIAGNOSIS — N528 Other male erectile dysfunction: Secondary | ICD-10-CM | POA: Diagnosis not present

## 2015-02-03 DIAGNOSIS — C61 Malignant neoplasm of prostate: Secondary | ICD-10-CM | POA: Diagnosis not present

## 2015-02-03 NOTE — Telephone Encounter (Signed)
New message     Pt request to talk to Richardson Dopp, PA only.  He want to stop taking the metoprolol.  He states he is having lots of side effects (he will discuss with him).  He recently saw a urologist and the doctor thinks his problem is related to the metoprolol.  Please call to discuss other options.

## 2015-02-03 NOTE — Telephone Encounter (Signed)
Spoke with pt and he states that he has been having some side effects from Metoprolol. Pt states that he has no energy, decreased libido, incontinence and feels like his memory is a little off. Pt states that his urologist told him that it is his Metoprolol causing these problems. Pt states that his brother had the same problems when he took Metoprolol. Pt was placed on Metoprolol by a physician in [place]. Pt would like to know if he can either be switched to another medication or just take the Flecanide? Will forward to Richardson Dopp, PA-C for review and advisement.

## 2015-02-04 ENCOUNTER — Other Ambulatory Visit: Payer: Self-pay | Admitting: Physician Assistant

## 2015-02-04 DIAGNOSIS — I480 Paroxysmal atrial fibrillation: Secondary | ICD-10-CM

## 2015-02-04 DIAGNOSIS — Z79899 Other long term (current) drug therapy: Secondary | ICD-10-CM

## 2015-02-04 DIAGNOSIS — Z5181 Encounter for therapeutic drug level monitoring: Secondary | ICD-10-CM

## 2015-02-04 MED ORDER — DILTIAZEM HCL ER COATED BEADS 120 MG PO CP24: 120.0000 mg | ORAL_CAPSULE | Freq: Every day | ORAL | Status: DC

## 2015-02-04 NOTE — Telephone Encounter (Signed)
Stop Metoprolol Start Cardizem CD 120 mg QD Richardson Dopp, PA-C   02/04/2015 3:28 PM

## 2015-02-04 NOTE — Telephone Encounter (Signed)
Pt notified to d/c Toprol and to start Cardizem CD 120 mg daily; New Rx. Pt verbalized understanding and said thank you.

## 2015-02-04 NOTE — Telephone Encounter (Signed)
Follow up      Pt was calling back to see if he could talk to Richardson Dopp, PA regarding stopping metoprolol.  He states he has a lot of conference calls this pm.  If Nicki Reaper calls him and get the vm, ok to leave msg.

## 2015-02-04 NOTE — Telephone Encounter (Signed)
Lmptcb to go over recommenmdations from [employer]. PA about change in medication

## 2015-02-05 ENCOUNTER — Other Ambulatory Visit: Payer: Self-pay | Admitting: *Deleted

## 2015-02-08 ENCOUNTER — Telehealth: Payer: Self-pay | Admitting: *Deleted

## 2015-02-08 ENCOUNTER — Encounter: Payer: Medicare Other | Admitting: Physician Assistant

## 2015-02-08 ENCOUNTER — Encounter: Payer: Self-pay | Admitting: Physician Assistant

## 2015-02-08 ENCOUNTER — Ambulatory Visit (INDEPENDENT_AMBULATORY_CARE_PROVIDER_SITE_OTHER): Payer: Medicare Other

## 2015-02-08 DIAGNOSIS — I480 Paroxysmal atrial fibrillation: Secondary | ICD-10-CM

## 2015-02-08 DIAGNOSIS — Z5181 Encounter for therapeutic drug level monitoring: Secondary | ICD-10-CM

## 2015-02-08 DIAGNOSIS — Z79899 Other long term (current) drug therapy: Secondary | ICD-10-CM | POA: Diagnosis not present

## 2015-02-08 LAB — EXERCISE TOLERANCE TEST
Estimated workload: 10.6 METS
Exercise duration (min): 9 min
Exercise duration (sec): 29 s
MPHR: 150 {beats}/min
Peak HR: 117 {beats}/min
Percent HR: 78 %
RPE: 17
Rest HR: 51 {beats}/min

## 2015-02-08 NOTE — Telephone Encounter (Signed)
Pt notified of stress test results and recommendations to ok to continue on current dose of Flecainide and to keep f/u w/A-fib clinic. Pt agreeable to plan of care.

## 2015-02-24 ENCOUNTER — Emergency Department (HOSPITAL_COMMUNITY)
Admission: EM | Admit: 2015-02-24 | Discharge: 2015-02-24 | Disposition: A | Discharge status: Home / Self Care | Payer: Medicare Other | Attending: Emergency Medicine | Admitting: Emergency Medicine

## 2015-02-24 ENCOUNTER — Encounter (HOSPITAL_COMMUNITY): Payer: Self-pay | Admitting: Family Medicine

## 2015-02-24 ENCOUNTER — Emergency Department (HOSPITAL_COMMUNITY): Payer: Medicare Other

## 2015-02-24 ENCOUNTER — Telehealth: Payer: Self-pay | Admitting: Physician Assistant

## 2015-02-24 DIAGNOSIS — Z791 Long term (current) use of non-steroidal anti-inflammatories (NSAID): Secondary | ICD-10-CM | POA: Diagnosis not present

## 2015-02-24 DIAGNOSIS — Z9889 Other specified postprocedural states: Secondary | ICD-10-CM | POA: Diagnosis not present

## 2015-02-24 DIAGNOSIS — I480 Paroxysmal atrial fibrillation: Secondary | ICD-10-CM

## 2015-02-24 DIAGNOSIS — Z7982 Long term (current) use of aspirin: Secondary | ICD-10-CM | POA: Diagnosis not present

## 2015-02-24 DIAGNOSIS — E785 Hyperlipidemia, unspecified: Secondary | ICD-10-CM | POA: Diagnosis not present

## 2015-02-24 DIAGNOSIS — J45909 Unspecified asthma, uncomplicated: Secondary | ICD-10-CM | POA: Insufficient documentation

## 2015-02-24 DIAGNOSIS — R002 Palpitations: Secondary | ICD-10-CM | POA: Diagnosis present

## 2015-02-24 DIAGNOSIS — Z87891 Personal history of nicotine dependence: Secondary | ICD-10-CM | POA: Diagnosis not present

## 2015-02-24 DIAGNOSIS — M1990 Unspecified osteoarthritis, unspecified site: Secondary | ICD-10-CM | POA: Insufficient documentation

## 2015-02-24 DIAGNOSIS — Z79899 Other long term (current) drug therapy: Secondary | ICD-10-CM | POA: Diagnosis not present

## 2015-02-24 DIAGNOSIS — Z8546 Personal history of malignant neoplasm of prostate: Secondary | ICD-10-CM | POA: Diagnosis not present

## 2015-02-24 DIAGNOSIS — N528 Other male erectile dysfunction: Secondary | ICD-10-CM | POA: Diagnosis not present

## 2015-02-24 DIAGNOSIS — K219 Gastro-esophageal reflux disease without esophagitis: Secondary | ICD-10-CM | POA: Insufficient documentation

## 2015-02-24 LAB — I-STAT TROPONIN, ED
Troponin i, poc: 0.02 ng/mL (ref 0.00–0.08)
Troponin i, poc: 0.02 ng/mL (ref 0.00–0.08)

## 2015-02-24 LAB — BASIC METABOLIC PANEL
Anion gap: 9 (ref 5–15)
BUN: 18 mg/dL (ref 6–20)
CO2: 27 mmol/L (ref 22–32)
Calcium: 9.7 mg/dL (ref 8.9–10.3)
Chloride: 107 mmol/L (ref 101–111)
Creatinine, Ser: 1.12 mg/dL (ref 0.61–1.24)
GFR calc Af Amer: >60 mL/min (ref >60)
GFR calc non Af Amer: >60 mL/min (ref >60)
Glucose, Bld: 110 mg/dL — ABNORMAL HIGH (ref 65–99)
Potassium: 3.3 mmol/L — ABNORMAL LOW (ref 3.5–5.1)
Sodium: 143 mmol/L (ref 135–145)

## 2015-02-24 LAB — CBC WITH DIFFERENTIAL/PLATELET
Basophils Absolute: 0 10*3/uL (ref 0.0–0.1)
Basophils Relative: 0 %
Eosinophils Absolute: 0.2 10*3/uL (ref 0.0–0.7)
Eosinophils Relative: 3 %
HCT: 43.9 % (ref 39.0–52.0)
Hemoglobin: 15.4 g/dL (ref 13.0–17.0)
Lymphocytes Relative: 33 %
Lymphs Abs: 2 10*3/uL (ref 0.7–4.0)
MCH: 32.6 pg (ref 26.0–34.0)
MCHC: 35.1 g/dL (ref 30.0–36.0)
MCV: 93 fL (ref 78.0–100.0)
Monocytes Absolute: 0.6 10*3/uL (ref 0.1–1.0)
Monocytes Relative: 11 %
Neutro Abs: 3.2 10*3/uL (ref 1.7–7.7)
Neutrophils Relative %: 53 %
Platelets: 161 10*3/uL (ref 150–400)
RBC: 4.72 MIL/uL (ref 4.22–5.81)
RDW: 12.2 % (ref 11.5–15.5)
WBC: 6 10*3/uL (ref 4.0–10.5)

## 2015-02-24 LAB — TSH: TSH: 2.574 u[IU]/mL (ref 0.350–4.500)

## 2015-02-24 LAB — URINALYSIS, ROUTINE W REFLEX MICROSCOPIC
Bilirubin Urine: NEGATIVE
Glucose, UA: NEGATIVE mg/dL
Hgb urine dipstick: NEGATIVE
Ketones, ur: NEGATIVE mg/dL
Leukocytes, UA: NEGATIVE
Nitrite: NEGATIVE
Protein, ur: NEGATIVE mg/dL
Specific Gravity, Urine: 1.006 (ref 1.005–1.030)
pH: 7 (ref 5.0–8.0)

## 2015-02-24 MED ORDER — ASPIRIN 81 MG PO CHEW
324.0000 mg | CHEWABLE_TABLET | Freq: Once | ORAL | Status: AC
  Administered 2015-02-24: 324 mg via ORAL
  Filled 2015-02-24: qty 4

## 2015-02-24 MED ORDER — POTASSIUM CHLORIDE CRYS ER 20 MEQ PO TBCR
40.0000 meq | EXTENDED_RELEASE_TABLET | Freq: Once | ORAL | Status: AC
  Administered 2015-02-24: 40 meq via ORAL
  Filled 2015-02-24: qty 2

## 2015-02-24 MED ORDER — DILTIAZEM HCL 25 MG/5ML IV SOLN
10.0000 mg | Freq: Once | INTRAVENOUS | Status: AC
  Administered 2015-02-24: 10 mg via INTRAVENOUS
  Filled 2015-02-24: qty 5

## 2015-02-24 MED ORDER — SODIUM CHLORIDE 0.9 % IV SOLN
INTRAVENOUS | Status: DC
  Administered 2015-02-24: 02:00:00 via INTRAVENOUS

## 2015-02-24 NOTE — ED Notes (Signed)
I Stat Troponin Result - 0.02

## 2015-02-24 NOTE — ED Notes (Signed)
Pt complains of palpitations. Pt has a history of A-fib and believes he is in A-fib. Denies chest pain or shortness of breath.

## 2015-02-24 NOTE — Discharge Instructions (Signed)

## 2015-02-24 NOTE — ED Provider Notes (Signed)
By signing my name below, I, Brandon Simmons, attest that this documentation has been prepared under the direction and in the presence of [employer], DO. Electronically Signed: Helane Simmons, ED Scribe. 02/24/2015. 1:46 AM.  TIME SEEN: 1:40 AM   CHIEF COMPLAINT: Palpitations  [id number] Brandon Simmons is a 69 y.o. male former smoker with a PMHx of A-fib on ASA, HLD, and nonobstructive coronary artery disease who presents to the Emergency Department complaining of palpitations onset 2 hours ago. Pt states he was lying in bed asleep when the symptoms began, and notes the palpitations have since alleviated somewhat. He reports feeling as though his heart is in his throat. He notes he was hospitalized for A-fib the first time in October at [place] [facility]. He reports he is on several medications and denies missing any doses. He also denies taking any blood thinners, but notes he does take aspirin daily. Pt denies CP, SOB, n/v/d, and fever.   ROS: See HPI Constitutional: no fever  Eyes: no drainage  ENT: no runny nose   Cardiovascular:  no chest pain  Resp: no SOB  GI: no vomiting GU: no dysuria Integumentary: no rash  Allergy: no hives  Musculoskeletal: no leg swelling  Neurological: no slurred speech ROS otherwise negative  PAST MEDICAL HISTORY/PAST SURGICAL HISTORY:  Past Medical History  Diagnosis Date  . Hemorrhoids   . Diverticulosis   . Prostatitis   . Asthma   . GERD (gastroesophageal reflux disease)   . Organic erectile dysfunction   . Diverticulitis   . Arthritis   . Prostate cancer ([place]) 04/25/2006    Gleason 3+4=7  . S/P radiation therapy 01/19/2014 through 03/05/2014                                                       Prostate bed 6600 cGy in 33 sessions                          . Hyperlipemia   . PAF (paroxysmal atrial fibrillation) ([place])     a. admx to [facility] in [place], [place] 10/16 with AF with RVR >> converted to NSR with IV Dilt;  b. Flecainide started >> ETT neg  for pro-arrhythmia   . S/P cardiac cath     a. LHC at [facility] 10/16:  LM ok, mLAD 30%, LCx ok, dRCA 20%  . History of echocardiogram     a. Echo 10/16 [facility]):  EF 55%, trace MR, mild TR, mild to mod AI, mild dilated Ao root (41 mm)    MEDICATIONS:  Prior to Admission medications   Medication Sig Start Date End Date Taking? Authorizing Provider  albuterol (PROVENTIL HFA;VENTOLIN HFA) 108 (90 BASE) MCG/ACT inhaler Inhale 2 puffs into the lungs every 6 (six) hours as needed for wheezing or shortness of breath.     Historical Provider, MD  Alprostadil (PROSTAGLANDIN E1) POWD 0.65 mLs by Does not apply route as needed (FOR ERECTILE DYSFUNCTION).     Historical Provider, MD  aspirin 81 MG tablet Take 81 mg by mouth daily.    Historical Provider, MD  atorvastatin (LIPITOR) 40 MG tablet Take 20 mg by mouth daily.     Historical Provider, MD  Bioflavonoid Products (VITAMIN C PLUS PO) Take 1 tablet by  mouth daily.    Historical Provider, MD  cholecalciferol (VITAMIN D) 1000 UNITS tablet Take 1,000 Units by mouth daily.    Historical Provider, MD  diltiazem (CARDIZEM CD) 120 MG 24 hr capsule Take 1 capsule (120 mg total) by mouth daily. 02/04/15   Liliane Shi, PA-C  flecainide (TAMBOCOR) 50 MG tablet Take 1 tablet (50 mg total) by mouth 2 (two) times daily. 01/26/15   Liliane Shi, PA-C  fluticasone (FLONASE) 50 MCG/ACT nasal spray Place 1 spray into the nose as needed. 09/04/13   Historical Provider, MD  HYDROcodone-acetaminophen (NORCO) 5-325 MG per tablet Take 1 tablet by mouth every 6 (six) hours as needed for severe pain. 2.5-162.5 as needed    Historical Provider, MD  L-Lysine 500 MG TABS Take 1 tablet by mouth daily.    Historical Provider, MD  LORazepam (ATIVAN) 0.5 MG tablet Take 0.5 mg by mouth every 8 (eight) hours as needed for anxiety.    Historical Provider, MD  meloxicam (MOBIC) 15 MG tablet Take 7.5 mg by mouth daily.     Historical Provider, MD  Multiple Vitamin  (MULTIVITAMIN) capsule Take 1 capsule by mouth daily.    Historical Provider, MD  omeprazole (PRILOSEC) 20 MG capsule Take 20 mg by mouth 2 times daily at 12 noon and 4 pm.     Historical Provider, MD  predniSONE (DELTASONE) 10 MG tablet Take 10 mg by mouth as needed (FOR ASTHMA).     Historical Provider, MD  Probiotic Product (ALIGN) 4 MG CAPS Take by mouth 1 day or 1 dose.    Historical Provider, MD  psyllium (REGULOID) 0.52 G capsule Take 0.52 g by mouth 2 (two) times daily.    Historical Provider, MD  zolpidem (AMBIEN) 10 MG tablet Take 10 mg by mouth at bedtime as needed for sleep.    Historical Provider, MD    ALLERGIES:  No Known Allergies  SOCIAL HISTORY:  Social History  Substance Use Topics  . Smoking status: Former Smoker -- 1.00 packs/day for 10 years    Types: Cigarettes  . Smokeless tobacco: Former Systems developer     Comment: 1972  . Alcohol Use: 4.2 oz/week    7 Glasses of wine per week     Comment: 1 glass wine or beer daily    FAMILY HISTORY: Family History  Problem Relation Age of Onset  . Liver cancer Paternal Grandmother   . Heart disease Paternal Grandfather     Died age 84  . Heart disease Maternal Grandfather     Died age 22  . Diabetes Maternal Grandmother   . Emphysema Father   . Lung cancer Father   . Liver cancer    . Lung cancer    . Hypertension Mother   . Atrial fibrillation Son     EXAM: BP 134/95 mmHg  Pulse 124  Temp(Src) 97.5 F (36.4 C) (Oral)  Resp 20  Ht 5\' 10"  (1.778 m)  Wt 210 lb (95.255 kg)  BMI 30.13 kg/m2  SpO2 99% CONSTITUTIONAL: Alert and oriented and responds appropriately to questions. Well-appearing; well-nourished; GCS 15 HEAD: Normocephalic; atraumatic EYES: Conjunctivae clear, PERRL, EOMI ENT: normal nose; no rhinorrhea; moist mucous membranes; pharynx without lesions noted; no dental injury; no septal hematoma NECK: Supple, no meningismus, no LAD; no midline spinal tenderness, step-off or deformity CARD: irregularly  irregular and tachycardic; S1 and S2 appreciated; no murmurs, no clicks, no rubs, no gallops RESP: Normal chest excursion without splinting or tachypnea; breath sounds clear  and equal bilaterally; no wheezes, no rhonchi, no rales; no hypoxia or respiratory distress CHEST:  chest wall stable, no crepitus or ecchymosis or deformity, nontender to palpation ABD/GI: Normal bowel sounds; non-distended; soft, non-tender, no rebound, no guarding PELVIS:  stable, nontender to palpation BACK:  The back appears normal and is non-tender to palpation, there is no CVA tenderness; no midline spinal tenderness, step-off or deformity EXT: Normal ROM in all joints; non-tender to palpation; no edema; normal capillary refill; no cyanosis, no bony tenderness or bony deformity of patient's extremities, no joint effusion, no ecchymosis or lacerations    SKIN: Normal color for age and race; warm NEURO: Moves all extremities equally, sensation to light touch intact diffusely, cranial nerves II through XII intact PSYCH: The patient's mood and manner are appropriate. Grooming and personal hygiene are appropriate.  MEDICAL DECISION MAKING: Patient here with atrial fibrillation with rapid ventricular response. Heart rate fluctuates in the 120s to 140s per nursing staff but when I see the patient his heart rate is in the 100s-110s and he is mildly hypertensive. States his heart rate is normally in the 40s. He is on diltiazem and fleck and I reports he's been taking his medications as prescribed. No recent infectious symptoms. No vomiting or diarrhea. No recent alcohol use. We'll obtain cardiac labs and give a dose of IV diltiazem, IV fluids and reassess.  ED PROGRESS: Patient's labs unremarkable. First troponin negative. Will repeat second troponin. After diltiazem his heart rate is now in the 70s but still in atrial fibrillation. We'll continue to monitor patient.   Patient's second troponin is negative. He is now in a sinus  arrhythmia with a rate in the 50s which is his baseline. Reports he is feeling much better. His cardiologist is Dr. Percival Spanish.  Patient is not on anticoagulation and would like to have this discussion with his cardiologist. I recommend close outpatient follow-up with Dr. Percival Spanish and I will also send his cardiologist a message. Discussed with patient strict return precautions. He verbalizes understanding and is comfortable with this plan.   EKG Interpretation  Date/Time:  Wednesday February 24 2015 01:28:30 EST Ventricular Rate:  130 PR Interval:    QRS Duration: 94 QT Interval:  300 QTC Calculation: 441 R Axis:   5 Text Interpretation:  Atrial fibrillation Repol abnrm suggests ischemia, diffuse leads Baseline wander in lead(s) V4 Confirmed by Qiana Landgrebe,  DO, Harshitha Fretz [id number]) on 02/24/2015 1:37:42 AM         EKG Interpretation  Date/Time:  Wednesday February 24 2015 02:06:53 EST Ventricular Rate:  77 PR Interval:    QRS Duration: 100 QT Interval:  376 QTC Calculation: 425 R Axis:   1 Text Interpretation:  Atrial fibrillation Borderline repolarization abnormality When compared to prior EKG, rate is slower Confirmed by Ketina Mars,  DO, Torin Modica [phone]) on 02/24/2015 3:02:30 AM         Date: 02/24/2015 4:38 am (unable to confirm in MUSE; see picture below)  Rate: 54  Rhythm: Sinus arrhythmia   QRS Axis: normal  Intervals: normal  ST/T Wave abnormalities: normal  Conduction Disutrbances: none  Narrative Interpretation: Sinus arrhythmia          I personally performed the services described in this documentation, which was scribed in my presence. The recorded information has been reviewed and is accurate.   Mer [place], DO 02/24/15 [phone]

## 2015-02-24 NOTE — Telephone Encounter (Signed)
New message     Pt was in the ER last night with afib.  Please look at ER note and advise

## 2015-02-25 DIAGNOSIS — J301 Allergic rhinitis due to pollen: Secondary | ICD-10-CM | POA: Diagnosis not present

## 2015-02-26 ENCOUNTER — Other Ambulatory Visit: Payer: Self-pay | Admitting: Physician Assistant

## 2015-02-26 DIAGNOSIS — I480 Paroxysmal atrial fibrillation: Secondary | ICD-10-CM | POA: Insufficient documentation

## 2015-02-26 MED ORDER — DILTIAZEM HCL 30 MG PO TABS: ORAL_TABLET | ORAL | Status: DC

## 2015-02-26 NOTE — Telephone Encounter (Signed)
I will forward this phone note to Richardson Dopp, [facility] today for further advice. Pt seen 01/14/15 by PA. Pt has upcoming appt 12/15 with [facility]; 12/16 with Dr. Percival Spanish.

## 2015-02-26 NOTE — Telephone Encounter (Signed)
Follow Up ° °Pt returning call from earlier. Please call. °

## 2015-02-26 NOTE — Telephone Encounter (Signed)
I reached pt and went over the recommendatiosn to start diltiazem 30 mg tablet PRN for rapid palpitations, may repeat 1 hour later if needed, if so then need to call the office. Pt said he was told by hospital if this happens again call 911. I advised pt this is good advice. Pt said if this happens again does he take the diltiazem 30 mg and call 911. I stated since the hospital told him to call 911 if happens again then yes call 911 and take diltiazem 30 mg. Pt agreeable to plan of care and all instructions with verbal understanding.

## 2015-02-26 NOTE — Telephone Encounter (Signed)
Ok to send in for requested number of tablets?

## 2015-02-26 NOTE — Telephone Encounter (Signed)
Let's send in a Rx for Diltiazem 30 mg  -  Take 1 tab as needed for rapid palpitations.  May repeat in 1 hour if palpitations continue.  Call office/answering service if symptoms continue. I sent Rx to his pharmacy.  Keep appt with AFib clinic next week as planned and they can decide on whether to adjust Flecainide dose vs changing to a different medication for rhythm control. Richardson Dopp, PA-C   02/26/2015 2:18 PM

## 2015-02-26 NOTE — Telephone Encounter (Signed)
Lmptcb x 2 

## 2015-03-04 ENCOUNTER — Encounter (HOSPITAL_COMMUNITY): Payer: Self-pay | Admitting: Nurse Practitioner

## 2015-03-04 ENCOUNTER — Ambulatory Visit (HOSPITAL_COMMUNITY)
Admission: RE | Admit: 2015-03-04 | Discharge: 2015-03-04 | Disposition: A | Discharge status: Home / Self Care | Payer: Medicare Other | Source: Ambulatory Visit | Attending: Nurse Practitioner | Admitting: Nurse Practitioner

## 2015-03-04 VITALS — BP 132/74 | HR 57 | Ht 70.0 in | Wt 218.6 lb

## 2015-03-04 DIAGNOSIS — I480 Paroxysmal atrial fibrillation: Secondary | ICD-10-CM | POA: Diagnosis not present

## 2015-03-04 NOTE — Progress Notes (Addendum)
Patient ID: Brandon Simmons, male   DOB: [birth year], 69 y.o.   MRN: [id number]     Primary Care Physician: [name], MD Cardiologist: Dr.Hochrein    Brandon Simmons is a 69 y.o. male with a h/o prostrate CA, HL, PAF that was first diagnosed while he was in [place] this fall. He woke up with heart racing, dyspnea and presented to [facility] ER. He had an echo which showed normal function and reported cardiac catherization without any significant disease. He was placed on toprol from which he  had significant side effects. He was seen by Richardson Dopp and BB was stopped, replaced by cardizem and flecaininde 50 mg bid was started. Of note, his son and brother both have afib.He does drink 2 glasses of wine a night, no tobacco, no high amounts caffeine, tries to exercise on a regular basis. Is pending a sleep study,some snoring reported , no apnea.  He did well until 12/7 when he had breakthrough afib in the evening with v rate of 124. He presented to [facility] ER and received IV dilt with return of SR. He has since been given 30 mg dilt to use as pill in pocket. No further afib. We further discussed management of breakthrough Afib. Pt was under the impression that he had to go to the ER with every episode of afib. We discussed that it was not life threatening and when it would be appropriate to go to ER. We discussed triggers for afib.  He does describe a cough, intermittent "tickle in throat", that he thinks might be from cardizem or flecainide which I think would be unlikely. He is not on an ACE but of note, he is on injectable prostaglandin powder as needed for  ED, dose increased a few months ago, ? if contributing. He does travel to [place] several times a year and is very concerned re afib episodes with these trips. He also has an older brother who had an afib ablation and died shortly after the procedure.   Today, he denies symptoms of palpitations, chest pain, shortness of  breath, orthopnea, PND, lower extremity edema, dizziness, presyncope, syncope, or neurologic sequela. The patient is tolerating medications without difficulties and is otherwise without complaint today.   Past Medical History  Diagnosis Date  . Hemorrhoids   . Diverticulosis   . Prostatitis   . Asthma   . GERD (gastroesophageal reflux disease)   . Organic erectile dysfunction   . Diverticulitis   . Arthritis   . Prostate cancer ([place]) 04/25/2006    Gleason 3+4=7  . S/P radiation therapy 01/19/2014 through 03/05/2014                                                       Prostate bed 6600 cGy in 33 sessions                          . Hyperlipemia   . PAF (paroxysmal atrial fibrillation) ([place])     a. admx to [facility] in [place], [place] 10/16 with AF with RVR >> converted to NSR with IV Dilt;  b. Flecainide started >> ETT neg for pro-arrhythmia   . S/P cardiac cath     a. LHC at [facility] 10/16:  LM ok,  mLAD 30%, LCx ok, dRCA 20%  . History of echocardiogram     a. Echo 10/16 [facility]):  EF 55%, trace MR, mild TR, mild to mod AI, mild dilated Ao root (41 mm)   Past Surgical History  Procedure Laterality Date  . Prostatectomy  04/25/2006  . Knee arthroscopy      R  . Larynx surgery      vocal cord lesion- benign  . Appendectomy      1967  . Colonoscopy    . Polypectomy    . Cataract surgery Left     Current Outpatient Prescriptions  Medication Sig Dispense Refill  . acetaminophen (TYLENOL) 500 MG tablet Take 500 mg by mouth every 6 (six) hours as needed for mild pain.    Marland Kitchen albuterol (PROVENTIL HFA;VENTOLIN HFA) 108 (90 BASE) MCG/ACT inhaler Inhale 2 puffs into the lungs every 6 (six) hours as needed for wheezing or shortness of breath.     . Alprostadil (PROSTAGLANDIN E1) POWD 0.65 mLs by Does not apply route as needed (FOR ERECTILE DYSFUNCTION).     Marland Kitchen aspirin 81 MG tablet Take 81 mg by mouth daily.    Marland Kitchen atorvastatin (LIPITOR) 40 MG tablet Take 20 mg by mouth  daily.     Marland Kitchen Bioflavonoid Products (VITAMIN C PLUS PO) Take 1 tablet by mouth daily.    . cholecalciferol (VITAMIN D) 1000 UNITS tablet Take 1,000 Units by mouth daily.    Marland Kitchen diltiazem (CARDIZEM CD) 120 MG 24 hr capsule Take 1 capsule (120 mg total) by mouth daily. 30 capsule 6  . diltiazem (CARDIZEM) 30 MG tablet Take 1 by mouth as needed for rapid palpitations.  May repeat in 1 hour. 15 tablet 1  . flecainide (TAMBOCOR) 50 MG tablet Take 1 tablet (50 mg total) by mouth 2 (two) times daily. 60 tablet 11  . fluticasone (FLONASE) 50 MCG/ACT nasal spray Place 1 spray into the nose as needed.    Marland Kitchen HYDROcodone-acetaminophen (NORCO) 5-325 MG per tablet Take 1 tablet by mouth every 6 (six) hours as needed for severe pain. 2.5-162.5 as needed    . L-Lysine 500 MG TABS Take 1 tablet by mouth daily.    . meloxicam (MOBIC) 15 MG tablet Take 7.5 mg by mouth every other day.     . Multiple Vitamin (MULTIVITAMIN) capsule Take 1 capsule by mouth daily.    Marland Kitchen omeprazole (PRILOSEC) 20 MG capsule Take 20 mg by mouth 2 times daily at 12 noon and 4 pm.     . predniSONE (DELTASONE) 10 MG tablet Take 10 mg by mouth as needed (FOR ASTHMA).     . Probiotic Product (ALIGN) 4 MG CAPS Take by mouth 1 day or 1 dose.    . psyllium (REGULOID) 0.52 G capsule Take 0.52 g by mouth 2 (two) times daily.    Marland Kitchen zolpidem (AMBIEN) 10 MG tablet Take 10 mg by mouth at bedtime as needed for sleep.     No current facility-administered medications for this encounter.    No Known Allergies  Social History   Social History  . Marital Status: Married    Spouse Name: N/A  . Number of Children: 2  . Years of Education: N/A   Occupational History  . 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 Smoking status: Former Smoker -- 1.00 packs/day for 10 years    Types: Cigarettes  . Smokeless tobacco: Former Systems developer  Comment: 1972  . Alcohol Use: 4.2 oz/week    7 Glasses of wine per week     Comment: 1 glass wine  or beer daily  . Drug Use: No  . Sexual Activity: Not on file   Other Topics Concern  . Not on file   Social History Narrative   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  Consultant for schools.   Travels over seas     Family History  Problem Relation Age of Onset  . Liver cancer Paternal Grandmother   . Heart disease Paternal Grandfather     Died age 28  . Heart disease Maternal Grandfather     Died age 44  . Diabetes Maternal Grandmother   . Emphysema Father   . Lung cancer Father   . Liver cancer    . Lung cancer    . Hypertension Mother   . Atrial fibrillation Son     ROS- All systems are reviewed and negative except as per the HPI above  Physical Exam: Filed Vitals:   03/04/15 1102  BP: 132/74  Pulse: 57  Height: 5\' 10"  (1.778 m)  Weight: 218 lb 9.6 oz (99.156 kg)    GEN- The patient is well appearing, alert and oriented x 3 today.   Head- normocephalic, atraumatic Eyes-  Sclera clear, conjunctiva pink Ears- hearing intact Oropharynx- clear Neck- supple, no JVP Lymph- no cervical lymphadenopathy Lungs- Clear to ausculation bilaterally, normal work of breathing Heart- Regular rate and rhythm, no murmurs, rubs or gallops, PMI not laterally displaced GI- soft, NT, ND, + BS Extremities- no clubbing, cyanosis, or edema MS- no significant deformity or atrophy Skin- no rash or lesion Psych- euthymic mood, full affect Neuro- strength and sensation are intact  EKG- Sinus brady at 57 bpm, pr int 178 ms, qrs int 96 ms, Qtc 395 ms. 10/18-Echo reviewed from [facility]- Normal EF at 55%, normal left atrial size 10/18-  Left heart cath revealed non obstructive disease  Assessment and Plan: 1. PAF Currently on flecainide/cardizem and has 30 mg prn cardizem to use as well. If afib burden increases, consider increase of flecainide to 100 mg bid  Educated re when appropriate to go to ER and how to manage Afib at home Not interested in ablation at this time due to bad  outcome of brother Offered f/u appointment with Dr. Rayann Heman to further discuss options but he declined at this time.  2.Lifestyle factors Decrease alcohol to no more than  2 drinks a week Sleep study pending Encouraged regular exercise  F/u with Dr. Percival Spanish tomorrow  Afib clinic as needed  Geroge Baseman. Graceanne Guin, [facility] [address] [place], [place] [address] [phone]

## 2015-03-05 ENCOUNTER — Ambulatory Visit (INDEPENDENT_AMBULATORY_CARE_PROVIDER_SITE_OTHER): Payer: Medicare Other | Admitting: Cardiology

## 2015-03-05 ENCOUNTER — Encounter: Payer: Self-pay | Admitting: Cardiology

## 2015-03-05 VITALS — BP 116/68 | HR 53 | Ht 70.0 in | Wt 218.2 lb

## 2015-03-05 DIAGNOSIS — I480 Paroxysmal atrial fibrillation: Secondary | ICD-10-CM | POA: Diagnosis not present

## 2015-03-05 NOTE — Progress Notes (Signed)
Cardiology Office Note   Date:  03/05/2015   ID:  Brandon Simmons, DOB 09-03-1945, MRN [id number]  PCP:  [name], MD  Cardiologist:   Minus Breeding, MD   Chief Complaint  Patient presents with  . Follow-up    pt states no cardiology problems      History of Present Illness: Brandon Simmons is a 69 y.o. male who presents for evaluation of atrial fib.  He has had paroxysms of this. The most recent was earlier this month. He is being followed in the atrial fibrillation clinic.   It was discussed with him that he might want to reduce his wine from two glasses per night to 3 per week.  He feels the rapid rates.    I reveiwed the ED notes.  He went back to atrial fib after IV cardiazem.  He otherwise feels well.  The patient denies any new symptoms such as chest discomfort, neck or arm discomfort. There has been no new shortness of breath, PND or orthopnea. There have been no reported palpitations, presyncope or syncope.   Past Medical History  Diagnosis Date  . Hemorrhoids   . Diverticulosis   . Prostatitis   . Asthma   . GERD (gastroesophageal reflux disease)   . Organic erectile dysfunction   . Diverticulitis   . Arthritis   . Prostate cancer ([place]) 04/25/2006    Gleason 3+4=7  . S/P radiation therapy 01/19/2014 through 03/05/2014                                                       Prostate bed 6600 cGy in 33 sessions                          . Hyperlipemia   . PAF (paroxysmal atrial fibrillation) ([place])     a. admx to [facility] in [place], [place] 10/16 with AF with RVR >> converted to NSR with IV Dilt;  b. Flecainide started >> ETT neg for pro-arrhythmia   . S/P cardiac cath     a. LHC at [facility] 10/16:  LM ok, mLAD 30%, LCx ok, dRCA 20%  . History of echocardiogram     a. Echo 10/16 [facility]):  EF 55%, trace MR, mild TR, mild to mod AI, mild dilated Ao root (41 mm)    Past Surgical History  Procedure Laterality Date  .  Prostatectomy  04/25/2006  . Knee arthroscopy      R  . Larynx surgery      vocal cord lesion- benign  . Appendectomy      1967  . Colonoscopy    . Polypectomy    . Cataract surgery Left      Current Outpatient Prescriptions  Medication Sig Dispense Refill  . acetaminophen (TYLENOL) 500 MG tablet Take 500 mg by mouth every 6 (six) hours as needed for mild pain.    Marland Kitchen albuterol (PROVENTIL HFA;VENTOLIN HFA) 108 (90 BASE) MCG/ACT inhaler Inhale 2 puffs into the lungs every 6 (six) hours as needed for wheezing or shortness of breath.     . Alprostadil (PROSTAGLANDIN E1) POWD 0.75 mLs by Does not apply route as needed (FOR ERECTILE DYSFUNCTION).     Marland Kitchen aspirin 81 MG tablet Take 81  mg by mouth daily.    Marland Kitchen atorvastatin (LIPITOR) 40 MG tablet Take 20 mg by mouth daily.     . cholecalciferol (VITAMIN D) 1000 UNITS tablet Take 1,000 Units by mouth daily.    Marland Kitchen diltiazem (CARDIZEM CD) 120 MG 24 hr capsule Take 1 capsule (120 mg total) by mouth daily. 30 capsule 6  . diltiazem (CARDIZEM) 30 MG tablet Take 1 by mouth as needed for rapid palpitations.  May repeat in 1 hour. 15 tablet 1  . flecainide (TAMBOCOR) 50 MG tablet Take 1 tablet (50 mg total) by mouth 2 (two) times daily. 60 tablet 11  . fluticasone (FLONASE) 50 MCG/ACT nasal spray Place 1 spray into the nose as needed.    Marland Kitchen HYDROcodone-acetaminophen (NORCO) 5-325 MG per tablet Take 1 tablet by mouth every 6 (six) hours as needed for severe pain. 2.5-162.5 as needed    . L-Lysine 500 MG TABS Take 1 tablet by mouth daily.    . meloxicam (MOBIC) 15 MG tablet Take 7.5 mg by mouth every other day.     . Multiple Vitamin (MULTIVITAMIN) capsule Take 1 capsule by mouth daily.    Marland Kitchen omeprazole (PRILOSEC) 20 MG capsule Take 20 mg by mouth 2 times daily at 12 noon and 4 pm.     . predniSONE (DELTASONE) 10 MG tablet Take 10 mg by mouth as needed (FOR ASTHMA).     . Probiotic Product (ALIGN) 4 MG CAPS Take by mouth 1 day or 1 dose.    . psyllium (REGULOID)  0.52 G capsule Take 0.52 g by mouth 2 (two) times daily.    Marland Kitchen zolpidem (AMBIEN) 10 MG tablet Take 10 mg by mouth at bedtime as needed for sleep.     No current facility-administered medications for this visit.    Allergies:   Review of patient's allergies indicates no known allergies.    ROS:  Please see the history of present illness.   Otherwise, review of systems are positive for lumbar spine injury, reflux..   All other systems are reviewed and negative.    PHYSICAL EXAM: VS:  BP 116/68 mmHg  Pulse 53  Ht 5\' 10"  (1.778 m)  Wt 218 lb 3.2 oz (98.975 kg)  BMI 31.31 kg/m2 , BMI Body mass index is 31.31 kg/(m^2). GEN: Well nourished, well developed, in no acute distress HEENT: normal Neck: no JVD, carotid bruits, or masses Cardiac: RRR; no murmurs, rubs, or gallops,no edema  Respiratory:  clear to auscultation bilaterally, normal work of breathing GI: soft, nontender, nondistended, + BS MS: no deformity or atrophy Skin: warm and dry, no rash Neuro:  Strength and sensation are intact Psych: euthymic mood, full affect   EKG:  EKG is ordered today. The ekg ordered today demonstrates Sinus rhythm, rate 58, axis within normal limits, intervals within normal limits, no acute ST-T wave changes.    Recent Labs: 02/24/2015: BUN 18; Creatinine, Ser 1.12; Hemoglobin 15.4; Platelets 161; Potassium 3.3*; Sodium 143; TSH 2.574    Lipid Panel No results found for: CHOL, TRIG, HDL, CHOLHDL, VLDL, LDLCALC, LDLDIRECT    Wt Readings from Last 3 Encounters:  03/05/15 218 lb 3.2 oz (98.975 kg)  03/04/15 218 lb 9.6 oz (99.156 kg)  02/24/15 210 lb (95.255 kg)      Other studies Reviewed: Additional studies/ records that were reviewed today include: Outside records. Review of the above records demonstrates:   As recorded elsewhere   ASSESSMENT AND PLAN:  ATRIAL FIB:  Wear long discussion about  this. For now he'll remain on the flecainide. However he has any increased tachypalpitations  this dose could be increased 200 mg twice a day. He'll call the A. fib clinic if that happens. He's going to reduce his alcohol intake. We also discussed taking when necessary Cardizem. OVERWEIGHT:  The patient understands the need to lose weight with diet and exercise. We have discussed specific strategies for this.   Current medicines are reviewed at length with the patient today.  The patient does not have concerns regarding medicines.  The following changes have been made:  no change  Labs/ tests ordered today include: nONE     Disposition:   FU with me in 4 months.    Signed, Minus Breeding, MD  03/05/2015 7:51 AM    [place]

## 2015-03-05 NOTE — Patient Instructions (Addendum)
Your physician wants you to follow-up in: 4 Months. You will receive a reminder letter in the mail two months in advance. If you don't receive a letter, please call our office to schedule the follow-up appointment.  Merry Christmas and Happy New Year!!

## 2015-03-08 ENCOUNTER — Other Ambulatory Visit: Payer: Self-pay | Admitting: *Deleted

## 2015-03-08 DIAGNOSIS — I480 Paroxysmal atrial fibrillation: Secondary | ICD-10-CM

## 2015-03-23 ENCOUNTER — Telehealth (HOSPITAL_COMMUNITY): Payer: Self-pay | Admitting: *Deleted

## 2015-03-23 NOTE — Telephone Encounter (Signed)
Patient called in stating he went into afib last night at 1:30AM and took 30mg  Cardizem repeating again at 230AM then converting back to NSR. Was pleased with this result instead of having to go to ER for afib attack. Informed patient to let us know if afib burden continues to increase as daily medication adjustments may need to be made. Patient requested I also send this information to Dr. Percival Spanish as a FYI. Patient will call if further issues.

## 2015-03-24 DIAGNOSIS — R0609 Other forms of dyspnea: Secondary | ICD-10-CM | POA: Diagnosis not present

## 2015-03-24 DIAGNOSIS — J9801 Acute bronchospasm: Secondary | ICD-10-CM | POA: Diagnosis not present

## 2015-03-24 DIAGNOSIS — Z6831 Body mass index (BMI) 31.0-31.9, adult: Secondary | ICD-10-CM | POA: Diagnosis not present

## 2015-03-24 DIAGNOSIS — J45909 Unspecified asthma, uncomplicated: Secondary | ICD-10-CM | POA: Diagnosis not present

## 2015-03-25 ENCOUNTER — Telehealth (HOSPITAL_COMMUNITY): Payer: Self-pay | Admitting: *Deleted

## 2015-03-25 NOTE — Telephone Encounter (Signed)
Pt called in stating he had another episode of afib during the night - took 2 PRN cardizem and returned to NSR after few hours. Reports having asthma attack yesterday (he has those occasionally) and his PCP started him on a prednisone taper. Educated patient that prednisone may trigger more episodes of afib but to continue using cardizem as needed. Instructed on when to call back and when he would need to report to ER and pt verbalized understanding.

## 2015-03-26 DIAGNOSIS — R05 Cough: Secondary | ICD-10-CM | POA: Diagnosis not present

## 2015-03-26 DIAGNOSIS — J302 Other seasonal allergic rhinitis: Secondary | ICD-10-CM | POA: Diagnosis not present

## 2015-03-26 DIAGNOSIS — J45909 Unspecified asthma, uncomplicated: Secondary | ICD-10-CM | POA: Diagnosis not present

## 2015-03-26 DIAGNOSIS — I4891 Unspecified atrial fibrillation: Secondary | ICD-10-CM | POA: Diagnosis not present

## 2015-03-26 DIAGNOSIS — J9801 Acute bronchospasm: Secondary | ICD-10-CM | POA: Diagnosis not present

## 2015-03-30 ENCOUNTER — Other Ambulatory Visit: Payer: Self-pay | Admitting: *Deleted

## 2015-03-30 DIAGNOSIS — I480 Paroxysmal atrial fibrillation: Secondary | ICD-10-CM

## 2015-04-05 ENCOUNTER — Encounter (HOSPITAL_BASED_OUTPATIENT_CLINIC_OR_DEPARTMENT_OTHER): Payer: Medicare Other

## 2015-04-14 DIAGNOSIS — J301 Allergic rhinitis due to pollen: Secondary | ICD-10-CM | POA: Diagnosis not present

## 2015-04-14 DIAGNOSIS — J4520 Mild intermittent asthma, uncomplicated: Secondary | ICD-10-CM | POA: Diagnosis not present

## 2015-04-14 DIAGNOSIS — J3089 Other allergic rhinitis: Secondary | ICD-10-CM | POA: Diagnosis not present

## 2015-05-03 ENCOUNTER — Ambulatory Visit (HOSPITAL_BASED_OUTPATIENT_CLINIC_OR_DEPARTMENT_OTHER): Payer: Medicare Other | Attending: Physician Assistant

## 2015-05-03 DIAGNOSIS — I480 Paroxysmal atrial fibrillation: Secondary | ICD-10-CM | POA: Insufficient documentation

## 2015-05-03 DIAGNOSIS — Z79899 Other long term (current) drug therapy: Secondary | ICD-10-CM | POA: Diagnosis not present

## 2015-05-03 DIAGNOSIS — R0683 Snoring: Secondary | ICD-10-CM | POA: Insufficient documentation

## 2015-05-05 ENCOUNTER — Telehealth: Payer: Self-pay | Admitting: Cardiology

## 2015-05-05 ENCOUNTER — Encounter: Payer: Self-pay | Admitting: Physician Assistant

## 2015-05-05 DIAGNOSIS — N182 Chronic kidney disease, stage 2 (mild): Secondary | ICD-10-CM | POA: Diagnosis not present

## 2015-05-05 DIAGNOSIS — E784 Other hyperlipidemia: Secondary | ICD-10-CM | POA: Diagnosis not present

## 2015-05-05 NOTE — Sleep Study (Signed)
   Patient Name: Brandon Simmons, Brandon Simmons MRN: [id number] Study Date: 05/03/2015 Gender: Male D.O.B: Jan 05, 1946 Age (years): 79 Referring Provider: Richardson Dopp Interpreting Physician: Fransico Him MD, ABSM RPSGT: Madelon Lips  Weight (lbs): 205 BMI: 29 Height (inches): 70 Neck Size: 15.50  CLINICAL INFORMATION Sleep Study Type: NPSG Indication for sleep study: Snoring Epworth Sleepiness Score: 2  SLEEP STUDY TECHNIQUE As per the AASM Manual for the Scoring of Sleep and Associated Events v2.3 (April 2016) with a hypopnea requiring 4% desaturations. The channels recorded and monitored were frontal, central and occipital EEG, electrooculogram (EOG), submentalis EMG (chin), nasal and oral airflow, thoracic and abdominal wall motion, anterior tibialis EMG, snore microphone, electrocardiogram, and pulse oximetry.  MEDICATIONS Patient's medications include: Tylenol, Albuterol, ASA, Lipitor, Cardizem, Vitamin D, Flecainide, flonase, Norco, Mobic, Prilosec, Ambien. . Medications self-administered by patient during sleep study : No sleep medicine administered.  SLEEP ARCHITECTURE The study was initiated at 10:46:21 PM and ended at 4:48:29 AM. Sleep onset time was 10.2 minutes and the sleep efficiency was reduced at 52.6%. The total sleep time was 190.5 minutes. Stage REM latency was prolonged at 193.5 minutes. The patient spent 15.49% of the night in stage N1 sleep, 65.62% in stage N2 sleep, 0.00% in stage N3 and 18.90% in REM. Alpha intrusion was absent. Supine sleep was 0.00%.  RESPIRATORY PARAMETERS The overall apnea/hypopnea index (AHI) was 0.6 per hour. There were 1 total apneas, including 1 obstructive, 0 central and 0 mixed apneas. There were 1 hypopneas and 3 RERAs. The AHI during Stage REM sleep was 3.3 per hour. AHI while supine was N/A per hour. The mean oxygen saturation was 94.33%. The minimum SpO2 during sleep was 92.00%. Moderate snoring was noted during this study.  CARDIAC  DATA The 2 lead EKG demonstrated sinus rhythm. The mean heart rate was 46.41 beats per minute. Other EKG findings include: None.  LEG MOVEMENT DATA The total PLMS were 0 with a resulting PLMS index of 0.00. Associated arousal with leg movement index was 0.0 .  IMPRESSIONS - No significant obstructive sleep apnea occurred during this study (AHI = 0.6/h). - No significant central sleep apnea occurred during this study (CAI = 0.0/h). - The patient had minimal or no oxygen desaturation during the study (Min O2 = 92.00%) - The patient snored with Moderate snoring volume. - No cardiac abnormalities were noted during this study. - Clinically significant periodic limb movements did not occur during sleep. No significant associated arousals.  DIAGNOSIS - Snoring  RECOMMENDATIONS - Avoid alcohol, sedatives and other CNS depressants that may result in sleep apnea and disrupt normal sleep architecture. - Sleep hygiene should be reviewed to assess factors that may improve sleep quality. - Weight management and regular exercise should be initiated or continued if appropriate.   Sueanne Margarita Diplomate, American Board of Sleep Medicine  ELECTRONICALLY SIGNED ON:  05/05/2015, 7:05 PM [place] PH: [phone]   FX: [phone] [place]

## 2015-05-05 NOTE — Telephone Encounter (Signed)
Please let patient know that sleep study showed no significant sleep apnea.    

## 2015-05-06 NOTE — Telephone Encounter (Signed)
Patient informed of results.  Stated verbal understanding 

## 2015-05-11 DIAGNOSIS — E784 Other hyperlipidemia: Secondary | ICD-10-CM | POA: Diagnosis not present

## 2015-05-11 DIAGNOSIS — J302 Other seasonal allergic rhinitis: Secondary | ICD-10-CM | POA: Diagnosis not present

## 2015-05-11 DIAGNOSIS — K219 Gastro-esophageal reflux disease without esophagitis: Secondary | ICD-10-CM | POA: Diagnosis not present

## 2015-05-11 DIAGNOSIS — Z6831 Body mass index (BMI) 31.0-31.9, adult: Secondary | ICD-10-CM | POA: Diagnosis not present

## 2015-05-11 DIAGNOSIS — R0609 Other forms of dyspnea: Secondary | ICD-10-CM | POA: Diagnosis not present

## 2015-05-11 DIAGNOSIS — J45909 Unspecified asthma, uncomplicated: Secondary | ICD-10-CM | POA: Diagnosis not present

## 2015-05-11 DIAGNOSIS — I4891 Unspecified atrial fibrillation: Secondary | ICD-10-CM | POA: Diagnosis not present

## 2015-05-11 DIAGNOSIS — Z1389 Encounter for screening for other disorder: Secondary | ICD-10-CM | POA: Diagnosis not present

## 2015-05-11 DIAGNOSIS — Z0000 Encounter for general adult medical examination without abnormal findings: Secondary | ICD-10-CM | POA: Diagnosis not present

## 2015-05-11 DIAGNOSIS — C61 Malignant neoplasm of prostate: Secondary | ICD-10-CM | POA: Diagnosis not present

## 2015-05-11 DIAGNOSIS — E668 Other obesity: Secondary | ICD-10-CM | POA: Diagnosis not present

## 2015-05-17 DIAGNOSIS — Z1212 Encounter for screening for malignant neoplasm of rectum: Secondary | ICD-10-CM | POA: Diagnosis not present

## 2015-06-11 DIAGNOSIS — C61 Malignant neoplasm of prostate: Secondary | ICD-10-CM | POA: Diagnosis not present

## 2015-06-18 DIAGNOSIS — Z0000 Encounter for general adult medical examination without abnormal findings: Secondary | ICD-10-CM | POA: Diagnosis not present

## 2015-06-18 DIAGNOSIS — C61 Malignant neoplasm of prostate: Secondary | ICD-10-CM | POA: Diagnosis not present

## 2015-06-18 DIAGNOSIS — N528 Other male erectile dysfunction: Secondary | ICD-10-CM | POA: Diagnosis not present

## 2015-06-22 DIAGNOSIS — R51 Headache: Secondary | ICD-10-CM | POA: Diagnosis not present

## 2015-06-22 DIAGNOSIS — Z6831 Body mass index (BMI) 31.0-31.9, adult: Secondary | ICD-10-CM | POA: Diagnosis not present

## 2015-06-22 DIAGNOSIS — J45998 Other asthma: Secondary | ICD-10-CM | POA: Diagnosis not present

## 2015-06-24 NOTE — Progress Notes (Signed)
Cardiology Office Note   Date:  06/25/2015   ID:  Brandon Simmons, DOB Apr 06, 1945, MRN [id number]  PCP:  [name], MD  Cardiologist:   Minus Breeding, MD   Chief Complaint  Patient presents with  . 4 months  . Dizziness      History of Present Illness: Brandon Simmons is a 70 y.o. male who presents for evaluation of atrial fib.  Since I last saw him he's had one paroxysms of this. This was in January at the time of an asthma flare. He's now on maintenance therapy to prevent this. He's otherwise not any acute treatment. He's not having any routine palpitations, presyncope or syncope. He reduced his alcohol intake. He is exercising routinely. He does have a little shortness of breath at times with activities he starting out which is unusual for this other activities he is not short of breath. He's not having any PND or orthopnea. He's not having any chest discomfort, neck or arm discomfort.   Past Medical History  Diagnosis Date  . Hemorrhoids   . Diverticulosis   . Prostatitis   . Asthma   . GERD (gastroesophageal reflux disease)   . Organic erectile dysfunction   . Diverticulitis   . Arthritis   . Prostate cancer ([place]) 04/25/2006    Gleason 3+4=7  . S/P radiation therapy 01/19/2014 through 03/05/2014                                                       Prostate bed 6600 cGy in 33 sessions                          . Hyperlipemia   . PAF (paroxysmal atrial fibrillation) ([place])     a. admx to [facility] in [place], [place] 10/16 with AF with RVR >> converted to NSR with IV Dilt;  b. Flecainide started >> ETT neg for pro-arrhythmia   . S/P cardiac cath     a. LHC at [facility] 10/16:  LM ok, mLAD 30%, LCx ok, dRCA 20%  . History of echocardiogram     a. Echo 10/16 [facility]):  EF 55%, trace MR, mild TR, mild to mod AI, mild dilated Ao root (41 mm)    Past Surgical History  Procedure Laterality Date  . Prostatectomy  04/25/2006  . Knee  arthroscopy      R  . Larynx surgery      vocal cord lesion- benign  . Appendectomy      1967  . Colonoscopy    . Polypectomy    . Cataract surgery Left      Current Outpatient Prescriptions  Medication Sig Dispense Refill  . acetaminophen (TYLENOL) 500 MG tablet Take 500 mg by mouth every 6 (six) hours as needed for mild pain.    Marland Kitchen albuterol (PROVENTIL HFA;VENTOLIN HFA) 108 (90 BASE) MCG/ACT inhaler Inhale 2 puffs into the lungs every 6 (six) hours as needed for wheezing or shortness of breath.     . Alprostadil (PROSTAGLANDIN E1) POWD 1 mL by Does not apply route as needed (FOR ERECTILE DYSFUNCTION).     Marland Kitchen aspirin 81 MG tablet Take 81 mg by mouth daily.    Marland Kitchen atorvastatin (LIPITOR) 40 MG tablet Take 20  mg by mouth daily.     . cholecalciferol (VITAMIN D) 1000 UNITS tablet Take 1,000 Units by mouth daily.    Marland Kitchen diltiazem (CARDIZEM CD) 120 MG 24 hr capsule Take 1 capsule (120 mg total) by mouth daily. 30 capsule 6  . diltiazem (CARDIZEM) 30 MG tablet Take 1 by mouth as needed for rapid palpitations.  May repeat in 1 hour. 15 tablet 1  . flecainide (TAMBOCOR) 50 MG tablet Take 1 tablet (50 mg total) by mouth 2 (two) times daily. 60 tablet 11  . fluticasone (FLONASE) 50 MCG/ACT nasal spray Place 1 spray into the nose as needed.    Marland Kitchen HYDROcodone-acetaminophen (NORCO) 5-325 MG per tablet Take 1 tablet by mouth every 6 (six) hours as needed for severe pain. 2.5-162.5 as needed    . L-Lysine 500 MG TABS Take 1 tablet by mouth daily.    . Multiple Vitamin (MULTIVITAMIN) capsule Take 1 capsule by mouth daily.    Marland Kitchen omeprazole (PRILOSEC) 20 MG capsule Take 20 mg by mouth 2 times daily at 12 noon and 4 pm.     . predniSONE (DELTASONE) 10 MG tablet Take 10 mg by mouth as needed (FOR ASTHMA).     . Probiotic Product (ALIGN) 4 MG CAPS Take by mouth 1 day or 1 dose.    . psyllium (REGULOID) 0.52 G capsule Take 0.52 g by mouth 2 (two) times daily.    Marland Kitchen zolpidem (AMBIEN) 10 MG tablet Take 10 mg by mouth  at bedtime as needed for sleep.     No current facility-administered medications for this visit.    Allergies:   Review of patient's allergies indicates no known allergies.    ROS:  Please see the history of present illness.   Otherwise, review of systems are negative   All other systems are reviewed and negative.    PHYSICAL EXAM: VS:  BP 126/72 mmHg  Pulse 60  Ht 5\' 10"  (1.778 m)  Wt 212 lb (96.163 kg)  BMI 30.42 kg/m2 , BMI Body mass index is 30.42 kg/(m^2). GENERAL:  Well appearing NECK:  No jugular venous distention, waveform within normal limits, carotid upstroke brisk and symmetric, no bruits, no thyromegaly LUNGS:  Clear to auscultation bilaterally BACK:  No CVA tenderness CHEST:  Unremarkable HEART:  PMI not displaced or sustained,S1 and S2 within normal limits, no S3, no S4, no clicks, no rubs, no murmurs ABD:  Flat, positive bowel sounds normal in frequency in pitch, no bruits, no rebound, no guarding, no midline pulsatile mass, no hepatomegaly, no splenomegaly EXT:  2 plus pulses throughout, no edema, no cyanosis no clubbing   Recent Labs: 02/24/2015: BUN 18; Creatinine, Ser 1.12; Hemoglobin 15.4; Platelets 161; Potassium 3.3*; Sodium 143; TSH 2.574    Lipid Panel No results found for: CHOL, TRIG, HDL, CHOLHDL, VLDL, LDLCALC, LDLDIRECT    Wt Readings from Last 3 Encounters:  06/25/15 212 lb (96.163 kg)  05/03/15 205 lb (92.987 kg)  03/05/15 218 lb 3.2 oz (98.975 kg)      Other studies Reviewed: Additional studies/ records that were reviewed today include:  None Review of the above records demonstrates:      ASSESSMENT AND PLAN:  ATRIAL FIB:  The patient is doing well with current management.  Of note he did not have sleep apnea. He will remain on the current dose of flecainide and when necessary Cardizem. If he has any increased symptoms I will increase his flecainide the future.    Current medicines are  reviewed at length with the patient today.  The  patient does not have concerns regarding medicines.  The following changes have been made:  no change  Labs/ tests ordered today include: none     Disposition:   FU with me in 12 months.    Signed, Minus Breeding, MD  06/25/2015 11:08 AM    [place]

## 2015-06-25 ENCOUNTER — Ambulatory Visit (INDEPENDENT_AMBULATORY_CARE_PROVIDER_SITE_OTHER): Payer: Medicare Other | Admitting: Cardiology

## 2015-06-25 ENCOUNTER — Encounter: Payer: Self-pay | Admitting: Cardiology

## 2015-06-25 VITALS — BP 126/72 | HR 60 | Ht 70.0 in | Wt 212.0 lb

## 2015-06-25 DIAGNOSIS — I480 Paroxysmal atrial fibrillation: Secondary | ICD-10-CM | POA: Diagnosis not present

## 2015-06-25 NOTE — Patient Instructions (Signed)
Your physician wants you to follow-up in: 1 Year. You will receive a reminder letter in the mail two months in advance. If you don't receive a letter, please call our office to schedule the follow-up appointment.  

## 2015-07-07 ENCOUNTER — Telehealth (HOSPITAL_COMMUNITY): Payer: Self-pay | Admitting: *Deleted

## 2015-07-07 DIAGNOSIS — D2262 Melanocytic nevi of left upper limb, including shoulder: Secondary | ICD-10-CM | POA: Diagnosis not present

## 2015-07-07 DIAGNOSIS — L814 Other melanin hyperpigmentation: Secondary | ICD-10-CM | POA: Diagnosis not present

## 2015-07-07 DIAGNOSIS — D2261 Melanocytic nevi of right upper limb, including shoulder: Secondary | ICD-10-CM | POA: Diagnosis not present

## 2015-07-07 DIAGNOSIS — D1801 Hemangioma of skin and subcutaneous tissue: Secondary | ICD-10-CM | POA: Diagnosis not present

## 2015-07-07 DIAGNOSIS — D2272 Melanocytic nevi of left lower limb, including hip: Secondary | ICD-10-CM | POA: Diagnosis not present

## 2015-07-07 DIAGNOSIS — D225 Melanocytic nevi of trunk: Secondary | ICD-10-CM | POA: Diagnosis not present

## 2015-07-07 DIAGNOSIS — L821 Other seborrheic keratosis: Secondary | ICD-10-CM | POA: Diagnosis not present

## 2015-07-07 NOTE — Telephone Encounter (Signed)
Pt called in stating he went into afib at 0700 this morning. HR was in the 90s he took prn cardizem at 0700 and again at 0800. HR is now in the 70s (normal HRs 50-60) BP 136/82 concerned about what he needs to do next since hes going out of the country on [place].  Discussed with Roderic Palau NP stated to take extra 50mg  of flecainide at 1pm today if still in afib and report back in the am of status. If still in afib in AM will bring in for office visit. Patient verbalized understanding.

## 2015-07-07 NOTE — Telephone Encounter (Signed)
Pt called back in stating he converted back into NSR at 1230 today so did not take flecainide. Patient feeling much better now.

## 2015-07-09 ENCOUNTER — Other Ambulatory Visit: Payer: Self-pay | Admitting: *Deleted

## 2015-07-09 MED ORDER — DILTIAZEM HCL ER COATED BEADS 120 MG PO CP24: 120.0000 mg | ORAL_CAPSULE | Freq: Every day | ORAL | Status: DC

## 2015-07-15 ENCOUNTER — Telehealth (HOSPITAL_COMMUNITY): Payer: Self-pay | Admitting: *Deleted

## 2015-07-15 NOTE — Telephone Encounter (Signed)
Pt called in from [place] (there for work) had another episode of afib that started during his presentation. Was concerning for him because he profusely started sweating which was abnormal for him with afib episodes.  After the presentation was over he took 2 doses of PRN cardizem but did present to the ER for observation since the episode was accompanied by sweating. EKG was normal he converted to NSR about 4 hours after he went into afib. Concerned whether he should increase his flecainide (ER doc in [place] mentioned this) or just continue with PRN cardizem.  Discussed with Roderic Palau NP -- patient can either go ahead and increase flecainide to 75mg  BID and follow up with EKG on Tuesday when he returns to the states or continue with current medications and PRN cardizem when needed. Patient would feel more comfortable continuing same regimen of medications until he is back in [place]. He will call to increase flecainide once back next week. Patient was in agreement.

## 2015-07-23 ENCOUNTER — Ambulatory Visit (HOSPITAL_COMMUNITY)
Admission: RE | Admit: 2015-07-23 | Discharge: 2015-07-23 | Disposition: A | Discharge status: Home / Self Care | Payer: Medicare Other | Source: Ambulatory Visit | Attending: Nurse Practitioner | Admitting: Nurse Practitioner

## 2015-07-23 ENCOUNTER — Encounter (HOSPITAL_COMMUNITY): Payer: Self-pay | Admitting: Nurse Practitioner

## 2015-07-23 VITALS — BP 148/82 | HR 46 | Ht 70.0 in | Wt 214.6 lb

## 2015-07-23 DIAGNOSIS — J45909 Unspecified asthma, uncomplicated: Secondary | ICD-10-CM | POA: Insufficient documentation

## 2015-07-23 DIAGNOSIS — M1990 Unspecified osteoarthritis, unspecified site: Secondary | ICD-10-CM | POA: Insufficient documentation

## 2015-07-23 DIAGNOSIS — I480 Paroxysmal atrial fibrillation: Secondary | ICD-10-CM | POA: Diagnosis not present

## 2015-07-23 DIAGNOSIS — Z87891 Personal history of nicotine dependence: Secondary | ICD-10-CM | POA: Diagnosis not present

## 2015-07-23 DIAGNOSIS — E785 Hyperlipidemia, unspecified: Secondary | ICD-10-CM | POA: Diagnosis not present

## 2015-07-23 DIAGNOSIS — Z8546 Personal history of malignant neoplasm of prostate: Secondary | ICD-10-CM | POA: Diagnosis not present

## 2015-07-23 DIAGNOSIS — Z7289 Other problems related to lifestyle: Secondary | ICD-10-CM | POA: Insufficient documentation

## 2015-07-23 DIAGNOSIS — Z0189 Encounter for other specified special examinations: Secondary | ICD-10-CM | POA: Insufficient documentation

## 2015-07-23 DIAGNOSIS — Z7982 Long term (current) use of aspirin: Secondary | ICD-10-CM | POA: Insufficient documentation

## 2015-07-23 DIAGNOSIS — K219 Gastro-esophageal reflux disease without esophagitis: Secondary | ICD-10-CM | POA: Insufficient documentation

## 2015-07-23 MED ORDER — FLECAINIDE ACETATE 50 MG PO TABS: 75.0000 mg | ORAL_TABLET | Freq: Two times a day (BID) | ORAL | Status: DC

## 2015-07-23 NOTE — Progress Notes (Signed)
Patient ID: Brandon Simmons, male   DOB: 08-15-1945, 70 y.o.   MRN: [id number]     Primary Care Physician: [name], MD Cardiologist: Dr.Hochrein    Brandon Simmons is a 70 y.o. male with a h/o prostrate CA, HL, PAF that was first diagnosed while he was in [place] this fall. He woke up with heart racing, dyspnea and presented to [facility] ER. He had an echo which showed normal function and reported cardiac catherization without any significant disease. He was placed on toprol from which he  had significant side effects. He was seen by Richardson Dopp and BB was stopped, replaced by cardizem and flecaininde 50 mg bid was started. Of note, his son and brother both have afib.He does drink 2 glasses of wine a night, no tobacco, no high amounts caffeine, tries to exercise on a regular basis. Is pending a sleep study,some snoring reported , no apnea.  He did well until 12/7 when he had breakthrough afib in the evening with v rate of 124. He presented to [facility] ER and received IV dilt with return of SR. He has since been given 30 mg dilt to use as pill in pocket. No further afib. We further discussed management of breakthrough Afib. Pt was under the impression that he had to go to the ER with every episode of afib. We discussed that it was not life threatening and when it would be appropriate to go to ER. We discussed triggers for afib.  He does describe a cough, intermittent "tickle in throat", that he thinks might be from cardizem or flecainide which I think would be unlikely. He is not on an ACE but of note, he is on injectable prostaglandin powder as needed for  ED, dose increased a few months ago, ? if contributing. He does travel to [place] several times a year and is very concerned re afib episodes with these trips. He also has an older cousin who had an afib ablation and died shortly after the procedure.   Pt returns to afib clinic, 5/5 to discuss episode of afib when on  business trip to [place]  last week. 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 He had just completed a presentation where he was standing for that period of time, when he developed profuse seating and was aware of afib.He sat down and rested and there was a school nurse there who recommended that he go to a private hospital and be evaluated. He took his prn cardizem before leaving for the ER. He was seen int the  ER by the chief cardiologist and by that time he had returned to [place]. He reports that the full cost of treatment in ER was $94.00! He has not had any further issues with afib since return but shows me that he has had a breakthrough episode a month for around 2 hours. Continues on flecainide 50 mg bid and asa . Chadsvasc score is one for age.  Today, he denies symptoms of palpitations, chest pain, shortness of breath, orthopnea, PND, lower extremity edema, dizziness, presyncope, syncope, or neurologic sequela. The patient is tolerating medications without difficulties and is otherwise without complaint today.   Past Medical History  Diagnosis Date  . Hemorrhoids   . Diverticulosis   . Prostatitis   . Asthma   . GERD (gastroesophageal reflux disease)   . Organic erectile dysfunction   . Diverticulitis   . Arthritis   . Prostate cancer ([place])  04/25/2006    Gleason 3+4=7  . S/P radiation therapy 01/19/2014 through 03/05/2014                                                       Prostate bed 6600 cGy in 33 sessions                          . Hyperlipemia   . PAF (paroxysmal atrial fibrillation) ([place])     a. admx to [facility] in [place], [place] 10/16 with AF with RVR >> converted to NSR with IV Dilt;  b. Flecainide started >> ETT neg for pro-arrhythmia   . S/P cardiac cath     a. LHC at [facility] 10/16:  LM ok, mLAD 30%, LCx ok, dRCA 20%  . History of echocardiogram     a. Echo 10/16 [facility]):  EF 55%, trace MR, mild TR, mild to mod AI, mild dilated Ao root  (41 mm)   Past Surgical History  Procedure Laterality Date  . Prostatectomy  04/25/2006  . Knee arthroscopy      R  . Larynx surgery      vocal cord lesion- benign  . Appendectomy      1967  . Colonoscopy    . Polypectomy    . Cataract surgery Left     Current Outpatient Prescriptions  Medication Sig Dispense Refill  . acetaminophen (TYLENOL) 500 MG tablet Take 500 mg by mouth every 6 (six) hours as needed for mild pain.    Marland Kitchen albuterol (PROVENTIL HFA;VENTOLIN HFA) 108 (90 BASE) MCG/ACT inhaler Inhale 2 puffs into the lungs every 6 (six) hours as needed for wheezing or shortness of breath.     Marland Kitchen aspirin 81 MG tablet Take 81 mg by mouth daily.    Marland Kitchen atorvastatin (LIPITOR) 40 MG tablet Take 20 mg by mouth daily.     . beclomethasone (QVAR) 40 MCG/ACT inhaler Inhale 2 puffs into the lungs 2 (two) times daily.    Marland Kitchen diltiazem (CARDIZEM CD) 120 MG 24 hr capsule Take 1 capsule (120 mg total) by mouth daily. 90 capsule 3  . diltiazem (CARDIZEM) 30 MG tablet Take 1 by mouth as needed for rapid palpitations.  May repeat in 1 hour. 15 tablet 1  . flecainide (TAMBOCOR) 50 MG tablet Take 1.5 tablets (75 mg total) by mouth 2 (two) times daily. 90 tablet 6  . fluticasone (FLONASE) 50 MCG/ACT nasal spray Place 1 spray into the nose as needed.    Marland Kitchen HYDROcodone-acetaminophen (NORCO) 5-325 MG per tablet Take 1 tablet by mouth every 6 (six) hours as needed for severe pain. 2.5-162.5 as needed    . Multiple Vitamin (MULTIVITAMIN) capsule Take 1 capsule by mouth daily.    Marland Kitchen omeprazole (PRILOSEC) 20 MG capsule Take 20 mg by mouth 2 times daily at 12 noon and 4 pm.     . predniSONE (DELTASONE) 10 MG tablet Take 10 mg by mouth as needed (FOR ASTHMA).     . Probiotic Product (ALIGN) 4 MG CAPS Take by mouth 1 day or 1 dose.    . psyllium (REGULOID) 0.52 G capsule Take 0.52 g by mouth 2 (two) times daily.    Marland Kitchen zolpidem (AMBIEN) 10 MG tablet Take 10 mg by mouth at  bedtime as needed for sleep.    . Alprostadil  (PROSTAGLANDIN E1) POWD 1 mL by Does not apply route as needed (FOR ERECTILE DYSFUNCTION).     Marland Kitchen cholecalciferol (VITAMIN D) 1000 UNITS tablet Take 1,000 Units by mouth daily.    Marland Kitchen L-Lysine 500 MG TABS Take 1 tablet by mouth daily.     No current facility-administered medications for this encounter.    No Known Allergies  Social History   Social History  . Marital Status: Married    Spouse Name: N/A  . Number of Children: 2  . Years of Education: N/A   Occupational History  . 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 Smoking status: Former Smoker -- 1.00 packs/day for 10 years    Types: Cigarettes  . Smokeless tobacco: Former Systems developer     Comment: 1972  . Alcohol Use: 4.2 oz/week    7 Glasses of wine per week     Comment: 1 glass wine or beer daily  . Drug Use: No  . Sexual Activity: Not on file   Other Topics Concern  . Not on file   Social History Narrative   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  Consultant for schools.   Travels over seas     Family History  Problem Relation Age of Onset  . Liver cancer Paternal Grandmother   . Heart disease Paternal Grandfather     Died age 43  . Heart disease Maternal Grandfather     Died age 41  . Diabetes Maternal Grandmother   . Emphysema Father   . Lung cancer Father   . Liver cancer    . Lung cancer    . Hypertension Mother   . Atrial fibrillation Son     ROS- All systems are reviewed and negative except as per the HPI above  Physical Exam: Filed Vitals:   07/23/15 1136  BP: 148/82  Pulse: 46  Height: 5\' 10"  (1.778 m)  Weight: 214 lb 9.6 oz (97.342 kg)    GEN- The patient is well appearing, alert and oriented x 3 today.   Head- normocephalic, atraumatic Eyes-  Sclera clear, conjunctiva pink Ears- hearing intact Oropharynx- clear Neck- supple, no JVP Lymph- no cervical lymphadenopathy Lungs- Clear to ausculation bilaterally, normal work of breathing Heart- Regular rate and rhythm, no murmurs,  rubs or gallops, PMI not laterally displaced GI- soft, NT, ND, + BS Extremities- no clubbing, cyanosis, or edema MS- no significant deformity or atrophy Skin- no rash or lesion Psych- euthymic mood, full affect Neuro- strength and sensation are intact  EKG-  Sinus brady at 46 bpm, pr int 188 ms, qrs int 92 ms, qtc 374 ms Ekg from ER in [place] 4/27 shows  NSR ,normal EKG 10/18-Echo reviewed from [facility]- Normal EF at 55%, normal left atrial size 10/18-  Left heart cath revealed non obstructive disease  Assessment and Plan: 1. PAF Currently on flecainide/cardizem and has 30 mg prn cardizem to use as well. With increase in afib burden will increase flecainide to 75 mg bid He will return next week for EKG Educated re when appropriate to go to ER and how to manage Afib at home Not interested in ablation at this time due to bad outcome of cousin Chadsvasc of 1, continue with asa  2.Lifestyle factors Decrease alcohol to no more than  2 drinks a week Sleep study done and no apnea Encouraged regular exercise   Butch Penny C.  Jena Tegeler, [facility] [address] [place], [place] [address] [phone]

## 2015-07-23 NOTE — Patient Instructions (Signed)
Your physician has recommended you make the following change in your medication:  1)Increase Flecainide to 75mg  ( 1 1/2 tablets) twice a day

## 2015-07-27 ENCOUNTER — Ambulatory Visit (HOSPITAL_COMMUNITY)
Admission: RE | Admit: 2015-07-27 | Discharge: 2015-07-27 | Disposition: A | Discharge status: Home / Self Care | Payer: Medicare Other | Source: Ambulatory Visit | Attending: Nurse Practitioner | Admitting: Nurse Practitioner

## 2015-07-27 DIAGNOSIS — R001 Bradycardia, unspecified: Secondary | ICD-10-CM | POA: Diagnosis not present

## 2015-07-27 DIAGNOSIS — I4891 Unspecified atrial fibrillation: Secondary | ICD-10-CM | POA: Diagnosis not present

## 2015-07-27 DIAGNOSIS — I480 Paroxysmal atrial fibrillation: Secondary | ICD-10-CM

## 2015-07-27 MED ORDER — FLECAINIDE ACETATE 50 MG PO TABS: 75.0000 mg | ORAL_TABLET | Freq: Two times a day (BID) | ORAL | Status: DC

## 2015-07-27 NOTE — Progress Notes (Addendum)
Patient in for repeat EKG after increasing flecainide dose. Roderic Palau NP to review EKG  EKG reviewed, S brady 54 ms, pr int 158 ms, QRS int 92 ms, QTc 400 ms.  Continue on flecainide 75 mg bid. F/u  in July.

## 2015-08-24 ENCOUNTER — Other Ambulatory Visit: Payer: Self-pay | Admitting: Allergy and Immunology

## 2015-08-24 ENCOUNTER — Ambulatory Visit
Admission: RE | Admit: 2015-08-24 | Discharge: 2015-08-24 | Disposition: A | Discharge status: Home / Self Care | Payer: Medicare Other | Source: Ambulatory Visit | Attending: Allergy and Immunology | Admitting: Allergy and Immunology

## 2015-08-24 DIAGNOSIS — J301 Allergic rhinitis due to pollen: Secondary | ICD-10-CM | POA: Diagnosis not present

## 2015-08-24 DIAGNOSIS — J3089 Other allergic rhinitis: Secondary | ICD-10-CM | POA: Diagnosis not present

## 2015-08-24 DIAGNOSIS — J4520 Mild intermittent asthma, uncomplicated: Secondary | ICD-10-CM | POA: Diagnosis not present

## 2015-08-24 DIAGNOSIS — R918 Other nonspecific abnormal finding of lung field: Secondary | ICD-10-CM | POA: Diagnosis not present

## 2015-08-24 DIAGNOSIS — J45909 Unspecified asthma, uncomplicated: Secondary | ICD-10-CM

## 2015-09-06 DIAGNOSIS — Z961 Presence of intraocular lens: Secondary | ICD-10-CM | POA: Diagnosis not present

## 2015-09-06 DIAGNOSIS — H25811 Combined forms of age-related cataract, right eye: Secondary | ICD-10-CM | POA: Diagnosis not present

## 2015-09-06 DIAGNOSIS — H353111 Nonexudative age-related macular degeneration, right eye, early dry stage: Secondary | ICD-10-CM | POA: Diagnosis not present

## 2015-09-06 DIAGNOSIS — Z0100 Encounter for examination of eyes and vision without abnormal findings: Secondary | ICD-10-CM | POA: Diagnosis not present

## 2015-09-14 ENCOUNTER — Other Ambulatory Visit (HOSPITAL_COMMUNITY): Payer: Self-pay | Admitting: *Deleted

## 2015-09-14 DIAGNOSIS — I480 Paroxysmal atrial fibrillation: Secondary | ICD-10-CM

## 2015-09-14 MED ORDER — FLECAINIDE ACETATE 150 MG PO TABS: 75.0000 mg | ORAL_TABLET | Freq: Two times a day (BID) | ORAL | Status: DC

## 2015-09-18 HISTORY — PX: CATARACT EXTRACTION W/ INTRAOCULAR LENS IMPLANT: SHX1309

## 2015-09-30 DIAGNOSIS — J4530 Mild persistent asthma, uncomplicated: Secondary | ICD-10-CM | POA: Diagnosis not present

## 2015-09-30 DIAGNOSIS — R490 Dysphonia: Secondary | ICD-10-CM | POA: Diagnosis not present

## 2015-09-30 DIAGNOSIS — J301 Allergic rhinitis due to pollen: Secondary | ICD-10-CM | POA: Diagnosis not present

## 2015-09-30 DIAGNOSIS — J3089 Other allergic rhinitis: Secondary | ICD-10-CM | POA: Diagnosis not present

## 2015-10-05 ENCOUNTER — Ambulatory Visit (HOSPITAL_COMMUNITY): Payer: Medicare Other | Admitting: Nurse Practitioner

## 2015-10-05 DIAGNOSIS — H25811 Combined forms of age-related cataract, right eye: Secondary | ICD-10-CM | POA: Diagnosis not present

## 2015-10-05 DIAGNOSIS — J3089 Other allergic rhinitis: Secondary | ICD-10-CM | POA: Diagnosis not present

## 2015-10-05 DIAGNOSIS — H268 Other specified cataract: Secondary | ICD-10-CM | POA: Diagnosis not present

## 2015-10-05 DIAGNOSIS — J301 Allergic rhinitis due to pollen: Secondary | ICD-10-CM | POA: Diagnosis not present

## 2015-10-06 DIAGNOSIS — J3089 Other allergic rhinitis: Secondary | ICD-10-CM | POA: Diagnosis not present

## 2015-10-06 DIAGNOSIS — J301 Allergic rhinitis due to pollen: Secondary | ICD-10-CM | POA: Diagnosis not present

## 2015-10-08 DIAGNOSIS — J3089 Other allergic rhinitis: Secondary | ICD-10-CM | POA: Diagnosis not present

## 2015-10-08 DIAGNOSIS — J301 Allergic rhinitis due to pollen: Secondary | ICD-10-CM | POA: Diagnosis not present

## 2015-10-11 DIAGNOSIS — J3089 Other allergic rhinitis: Secondary | ICD-10-CM | POA: Diagnosis not present

## 2015-10-11 DIAGNOSIS — J301 Allergic rhinitis due to pollen: Secondary | ICD-10-CM | POA: Diagnosis not present

## 2015-10-12 ENCOUNTER — Encounter (HOSPITAL_COMMUNITY): Payer: Self-pay | Admitting: Nurse Practitioner

## 2015-10-12 ENCOUNTER — Ambulatory Visit (HOSPITAL_COMMUNITY)
Admission: RE | Admit: 2015-10-12 | Discharge: 2015-10-12 | Disposition: A | Discharge status: Home / Self Care | Payer: Medicare Other | Source: Ambulatory Visit | Attending: Nurse Practitioner | Admitting: Nurse Practitioner

## 2015-10-12 ENCOUNTER — Other Ambulatory Visit: Payer: Self-pay

## 2015-10-12 ENCOUNTER — Other Ambulatory Visit (HOSPITAL_COMMUNITY): Payer: Self-pay | Admitting: *Deleted

## 2015-10-12 VITALS — BP 136/78 | Ht 70.0 in | Wt 213.8 lb

## 2015-10-12 DIAGNOSIS — I480 Paroxysmal atrial fibrillation: Secondary | ICD-10-CM | POA: Diagnosis not present

## 2015-10-12 DIAGNOSIS — Z87891 Personal history of nicotine dependence: Secondary | ICD-10-CM | POA: Insufficient documentation

## 2015-10-12 DIAGNOSIS — K219 Gastro-esophageal reflux disease without esophagitis: Secondary | ICD-10-CM | POA: Diagnosis not present

## 2015-10-12 DIAGNOSIS — N521 Erectile dysfunction due to diseases classified elsewhere: Secondary | ICD-10-CM | POA: Diagnosis not present

## 2015-10-12 DIAGNOSIS — M1990 Unspecified osteoarthritis, unspecified site: Secondary | ICD-10-CM | POA: Insufficient documentation

## 2015-10-12 DIAGNOSIS — Z8546 Personal history of malignant neoplasm of prostate: Secondary | ICD-10-CM | POA: Diagnosis not present

## 2015-10-12 DIAGNOSIS — E785 Hyperlipidemia, unspecified: Secondary | ICD-10-CM | POA: Diagnosis not present

## 2015-10-12 DIAGNOSIS — Z7982 Long term (current) use of aspirin: Secondary | ICD-10-CM | POA: Insufficient documentation

## 2015-10-12 DIAGNOSIS — J45909 Unspecified asthma, uncomplicated: Secondary | ICD-10-CM | POA: Diagnosis not present

## 2015-10-12 MED ORDER — MOMETASONE FUROATE 110 MCG/INH IN AEPB: 1.0000 | INHALATION_SPRAY | Freq: Every morning | RESPIRATORY_TRACT | Status: DC

## 2015-10-12 NOTE — Progress Notes (Signed)
Patient ID: Brandon Simmons, male   DOB: [birth year], 70 y.o.   MRN: [id number]     Primary Care Physician: Brandon Lopes, MD Cardiologist: Dr.Hochrein    Brandon Simmons is a 70 y.o. male with a h/o prostrate CA, HL, PAF that was first diagnosed while he was in [place] in fall of 2016. He woke up with heart racing, dyspnea and presented to [facility] ER. He had an echo which showed normal function and reported cardiac catherization without any significant disease. He was placed on toprol from which he  had significant side effects. He was seen by Richardson Dopp and BB was stopped, replaced by cardizem and flecaininde 50 mg bid was started. Of note, his son and brother both have afib.He drank 2 glasses of wine a night, no tobacco, no high amounts caffeine, tries to exercise on a regular basis. Is pending a sleep study,some snoring reported , no apnea.  He did well until 02/24/15, when he had breakthrough afib in the evening with v rate of 124. He presented to [facility] ER and received IV dilt with return of SR. He has since been given 30 mg dilt to use as pill in pocket. No further afib. We further discussed management of breakthrough Afib. Pt was under the impression that he had to go to the ER with every episode of afib. We discussed that it was not life threatening and when it would be appropriate to go to ER. We discussed triggers for afib.  He does describe a cough, intermittent "tickle in throat", that he thinks might be from cardizem or flecainide which I think would be unlikely. He is not on an ACE but of note, he is on injectable prostaglandin powder as needed for  ED, dose increased a few months ago, ? if contributing. He does travel to [place] several times a year and is very concerned re afib episodes with these trips. He also has an older cousin who had an afib ablation and died shortly after the procedure.   Pt returns to [place] clinic, 07/23/15 to discuss episode of afib when on  business trip to [place]  last week. 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 He sat down and rested and there was a school nurse there who recommended that he go to a private hospital and be evaluated. He took his prn cardizem before leaving for the ER. He was seen int the  ER by the chief cardiologist and by that time he had returned to [place]. He reports that the full cost of treatment in ER was $94.00! He has not had any further issues with afib since return but tells me that he has had a breakthrough episode a month ago, for around 2 hours. Continues on flecainide 50 mg bid and asa . Chadsvasc score is one for age, just on ASA.  Returns 7/25 and reports no episodes of afib. He does plan to travel extensively over the next few months and we discussed plan if he should develop afib. Chadsvasc score remains 1, on asa.  Today, he denies symptoms of palpitations, chest pain, shortness of breath, orthopnea, PND, lower extremity edema, dizziness, presyncope, syncope, or neurologic sequela. The patient is tolerating medications without difficulties and is otherwise without complaint today.   Past Medical History:  Diagnosis Date  .  Arthritis   . Asthma   . Diverticulitis   . Diverticulosis   . GERD (gastroesophageal reflux disease)   . Hemorrhoids   . History of echocardiogram    a. Echo 10/16 [facility]):  EF 55%, trace MR, mild TR, mild to mod AI, mild dilated Ao root (41 mm)  . Hyperlipemia   . Organic erectile dysfunction   . PAF (paroxysmal atrial fibrillation) ([place])    a. admx to [facility] in [place], [place] 10/16 with AF with RVR >> converted to NSR with IV Dilt;  b. Flecainide started >> ETT neg for pro-arrhythmia   . Prostate cancer ([place]) 04/25/2006   Gleason 3+4=7  . Prostatitis   . S/P cardiac cath    a. LHC at [facility] 10/16:  LM ok, mLAD 30%, LCx ok, dRCA 20%  . S/P radiation therapy 01/19/2014 through 03/05/2014                                                      Prostate bed 6600 cGy in 33 sessions                           Past Surgical History:  Procedure Laterality Date  . APPENDECTOMY     1967  . cataract surgery Left   . COLONOSCOPY    . KNEE ARTHROSCOPY     R  . LARYNX SURGERY     vocal cord lesion- benign  . POLYPECTOMY    . PROSTATECTOMY  04/25/2006    Current Outpatient Prescriptions  Medication Sig Dispense Refill  . acetaminophen (TYLENOL) 500 MG tablet Take 500 mg by mouth every 6 (six) hours as needed for mild pain.    Marland Kitchen albuterol (PROVENTIL HFA;VENTOLIN HFA) 108 (90 BASE) MCG/ACT inhaler Inhale 2 puffs into the lungs every 6 (six) hours as needed for wheezing or shortness of breath.     . Alprostadil (PROSTAGLANDIN E1) POWD 1 mL by Does not apply route as needed (FOR ERECTILE DYSFUNCTION).     Marland Kitchen aspirin 81 MG tablet Take 81 mg by mouth daily.    Marland Kitchen atorvastatin (LIPITOR) 40 MG tablet Take 20 mg by mouth daily.     . cholecalciferol (VITAMIN D) 1000 UNITS tablet Take 1,000 Units by mouth daily.    Marland Kitchen diltiazem (CARDIZEM CD) 120 MG 24 hr capsule Take 1 capsule (120 mg total) by mouth daily. 90 capsule 3  . diltiazem (CARDIZEM) 30 MG tablet Take 1 by mouth as needed for rapid palpitations.  May repeat in 1 hour. 15 tablet 1  . flecainide (TAMBOCOR) 150 MG tablet Take 0.5 tablets (75 mg total) by mouth 2 (two) times daily. 90 tablet 3  . fluticasone (FLONASE) 50 MCG/ACT nasal spray Place 1 spray into the nose as needed.    Marland Kitchen HYDROcodone-acetaminophen (NORCO) 5-325 MG per tablet Take 1 tablet by mouth every 6 (six) hours as needed for severe pain. 2.5-162.5 as needed    . L-Lysine 500 MG TABS Take 1 tablet by mouth daily.    . montelukast (SINGULAIR) 10 MG tablet Take 10 mg by mouth at bedtime.    . Multiple Vitamin (MULTIVITAMIN) capsule Take 1 capsule by mouth daily.    Marland Kitchen  omeprazole (PRILOSEC) 20 MG capsule Take 20 mg by  mouth 2 times daily at 12 noon and 4 pm.     . predniSONE (DELTASONE) 10 MG tablet Take 10 mg by mouth as needed (FOR ASTHMA).     . Probiotic Product (ALIGN) 4 MG CAPS Take by mouth 1 day or 1 dose.    . psyllium (REGULOID) 0.52 G capsule Take 0.52 g by mouth 2 (two) times daily.    Marland Kitchen zolpidem (AMBIEN) 10 MG tablet Take 10 mg by mouth at bedtime as needed for sleep.     No current facility-administered medications for this encounter.     No Known Allergies  Social History   Social History  . Marital status: Married    Spouse name: N/A  . Number of children: 2  . Years of education: N/A   Occupational History  . 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 Smoking status: Former Smoker    Packs/day: 1.00    Years: 10.00    Types: Cigarettes  . Smokeless tobacco: Former Systems developer     Comment: 1972  . Alcohol use 4.2 oz/week    7 Glasses of wine per week     Comment: 1 glass wine or beer daily  . Drug use: No  . Sexual activity: Not on file   Other Topics Concern  . Not on file   Social History Narrative   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  Consultant for schools.   Travels over seas     Family History  Problem Relation Age of Onset  . Liver cancer Paternal Grandmother   . Heart disease Paternal Grandfather     Died age 54  . Heart disease Maternal Grandfather     Died age 40  . Diabetes Maternal Grandmother   . Emphysema Father   . Lung cancer Father   . Liver cancer    . Lung cancer    . Hypertension Mother   . Atrial fibrillation Son     ROS- All systems are reviewed and negative except as per the HPI above  Physical Exam: Vitals:   10/12/15 1151  BP: 136/78  Weight: 213 lb 12.8 oz (97 kg)  Height: 5\' 10"  (1.778 m)    GEN- The patient is well appearing, alert and oriented x 3 today.   Head- normocephalic, atraumatic Eyes-  Sclera clear, conjunctiva pink Ears- hearing intact Oropharynx-  clear Neck- supple, no JVP Lymph- no cervical lymphadenopathy Lungs- Clear to ausculation bilaterally, normal work of breathing Heart- Regular rate and rhythm, no murmurs, rubs or gallops, PMI not laterally displaced GI- soft, NT, ND, + BS Extremities- no clubbing, cyanosis, or edema MS- no significant deformity or atrophy Skin- no rash or lesion Psych- euthymic mood, full affect Neuro- strength and sensation are intact  EKG-  Sinus brady at 50 bpm, pr int 188 ms, qrs int 96 ms, qtc 382 ms 10/16-Echo reviewed from [facility]- Normal EF at 55%, normal left atrial size 10/16-  Left heart cath revealed non obstructive disease  Assessment and Plan: 1. PAF Currently on flecainide 75 mg/ daily cardizem and has 30 mg prn cardizem to use as well HR is 50 bpm, but is not symptomatic No further episodes of afib   2.Lifestyle factors Decrease alcohol to no more than  2 drinks a week Sleep study done and no apnea Encouraged regular exercise  F/u with Dr. Percival Spanish as scheduled 12/17 afib clinic as needed  Butch Penny C. Lynn Sissel, [place]  [facility] [address] [place], [place] [address] [phone]

## 2015-10-13 DIAGNOSIS — J301 Allergic rhinitis due to pollen: Secondary | ICD-10-CM | POA: Diagnosis not present

## 2015-10-13 DIAGNOSIS — J3089 Other allergic rhinitis: Secondary | ICD-10-CM | POA: Diagnosis not present

## 2015-10-19 DIAGNOSIS — J3089 Other allergic rhinitis: Secondary | ICD-10-CM | POA: Diagnosis not present

## 2015-10-19 DIAGNOSIS — J301 Allergic rhinitis due to pollen: Secondary | ICD-10-CM | POA: Diagnosis not present

## 2015-10-22 DIAGNOSIS — J3089 Other allergic rhinitis: Secondary | ICD-10-CM | POA: Diagnosis not present

## 2015-10-22 DIAGNOSIS — J301 Allergic rhinitis due to pollen: Secondary | ICD-10-CM | POA: Diagnosis not present

## 2015-11-01 DIAGNOSIS — J301 Allergic rhinitis due to pollen: Secondary | ICD-10-CM | POA: Diagnosis not present

## 2015-11-01 DIAGNOSIS — J3089 Other allergic rhinitis: Secondary | ICD-10-CM | POA: Diagnosis not present

## 2015-11-05 DIAGNOSIS — J301 Allergic rhinitis due to pollen: Secondary | ICD-10-CM | POA: Diagnosis not present

## 2015-11-05 DIAGNOSIS — J3089 Other allergic rhinitis: Secondary | ICD-10-CM | POA: Diagnosis not present

## 2015-11-08 DIAGNOSIS — J301 Allergic rhinitis due to pollen: Secondary | ICD-10-CM | POA: Diagnosis not present

## 2015-11-08 DIAGNOSIS — J3089 Other allergic rhinitis: Secondary | ICD-10-CM | POA: Diagnosis not present

## 2015-11-09 DIAGNOSIS — R490 Dysphonia: Secondary | ICD-10-CM | POA: Diagnosis not present

## 2015-11-11 DIAGNOSIS — J3089 Other allergic rhinitis: Secondary | ICD-10-CM | POA: Diagnosis not present

## 2015-11-11 DIAGNOSIS — J301 Allergic rhinitis due to pollen: Secondary | ICD-10-CM | POA: Diagnosis not present

## 2015-11-30 DIAGNOSIS — J301 Allergic rhinitis due to pollen: Secondary | ICD-10-CM | POA: Diagnosis not present

## 2015-11-30 DIAGNOSIS — J3089 Other allergic rhinitis: Secondary | ICD-10-CM | POA: Diagnosis not present

## 2015-12-02 DIAGNOSIS — J3089 Other allergic rhinitis: Secondary | ICD-10-CM | POA: Diagnosis not present

## 2015-12-02 DIAGNOSIS — J301 Allergic rhinitis due to pollen: Secondary | ICD-10-CM | POA: Diagnosis not present

## 2015-12-07 DIAGNOSIS — J3089 Other allergic rhinitis: Secondary | ICD-10-CM | POA: Diagnosis not present

## 2015-12-07 DIAGNOSIS — J301 Allergic rhinitis due to pollen: Secondary | ICD-10-CM | POA: Diagnosis not present

## 2015-12-09 DIAGNOSIS — J301 Allergic rhinitis due to pollen: Secondary | ICD-10-CM | POA: Diagnosis not present

## 2015-12-09 DIAGNOSIS — J3089 Other allergic rhinitis: Secondary | ICD-10-CM | POA: Diagnosis not present

## 2015-12-23 DIAGNOSIS — J301 Allergic rhinitis due to pollen: Secondary | ICD-10-CM | POA: Diagnosis not present

## 2015-12-23 DIAGNOSIS — J3089 Other allergic rhinitis: Secondary | ICD-10-CM | POA: Diagnosis not present

## 2015-12-24 DIAGNOSIS — Z23 Encounter for immunization: Secondary | ICD-10-CM | POA: Diagnosis not present

## 2015-12-27 DIAGNOSIS — J3089 Other allergic rhinitis: Secondary | ICD-10-CM | POA: Diagnosis not present

## 2015-12-27 DIAGNOSIS — J301 Allergic rhinitis due to pollen: Secondary | ICD-10-CM | POA: Diagnosis not present

## 2015-12-31 DIAGNOSIS — J3089 Other allergic rhinitis: Secondary | ICD-10-CM | POA: Diagnosis not present

## 2015-12-31 DIAGNOSIS — J301 Allergic rhinitis due to pollen: Secondary | ICD-10-CM | POA: Diagnosis not present

## 2016-01-03 DIAGNOSIS — J301 Allergic rhinitis due to pollen: Secondary | ICD-10-CM | POA: Diagnosis not present

## 2016-01-03 DIAGNOSIS — J3089 Other allergic rhinitis: Secondary | ICD-10-CM | POA: Diagnosis not present

## 2016-01-05 DIAGNOSIS — J301 Allergic rhinitis due to pollen: Secondary | ICD-10-CM | POA: Diagnosis not present

## 2016-01-05 DIAGNOSIS — J3089 Other allergic rhinitis: Secondary | ICD-10-CM | POA: Diagnosis not present

## 2016-01-06 ENCOUNTER — Ambulatory Visit (HOSPITAL_COMMUNITY)
Admission: RE | Admit: 2016-01-06 | Discharge: 2016-01-06 | Disposition: A | Discharge status: Home / Self Care | Payer: Medicare Other | Source: Ambulatory Visit | Attending: Nurse Practitioner | Admitting: Nurse Practitioner

## 2016-01-06 ENCOUNTER — Encounter (HOSPITAL_COMMUNITY): Payer: Self-pay | Admitting: Nurse Practitioner

## 2016-01-06 VITALS — BP 124/76 | HR 66 | Ht 70.0 in | Wt 218.2 lb

## 2016-01-06 DIAGNOSIS — I480 Paroxysmal atrial fibrillation: Secondary | ICD-10-CM | POA: Insufficient documentation

## 2016-01-06 DIAGNOSIS — K649 Unspecified hemorrhoids: Secondary | ICD-10-CM | POA: Diagnosis not present

## 2016-01-06 DIAGNOSIS — N521 Erectile dysfunction due to diseases classified elsewhere: Secondary | ICD-10-CM | POA: Diagnosis not present

## 2016-01-06 DIAGNOSIS — C61 Malignant neoplasm of prostate: Secondary | ICD-10-CM | POA: Insufficient documentation

## 2016-01-06 DIAGNOSIS — J45909 Unspecified asthma, uncomplicated: Secondary | ICD-10-CM | POA: Diagnosis not present

## 2016-01-06 DIAGNOSIS — Q248 Other specified congenital malformations of heart: Secondary | ICD-10-CM | POA: Diagnosis not present

## 2016-01-06 DIAGNOSIS — N419 Inflammatory disease of prostate, unspecified: Secondary | ICD-10-CM | POA: Diagnosis not present

## 2016-01-06 DIAGNOSIS — K219 Gastro-esophageal reflux disease without esophagitis: Secondary | ICD-10-CM | POA: Diagnosis not present

## 2016-01-06 DIAGNOSIS — M1990 Unspecified osteoarthritis, unspecified site: Secondary | ICD-10-CM | POA: Insufficient documentation

## 2016-01-06 DIAGNOSIS — E785 Hyperlipidemia, unspecified: Secondary | ICD-10-CM | POA: Diagnosis not present

## 2016-01-06 DIAGNOSIS — Z87891 Personal history of nicotine dependence: Secondary | ICD-10-CM | POA: Insufficient documentation

## 2016-01-06 DIAGNOSIS — K5792 Diverticulitis of intestine, part unspecified, without perforation or abscess without bleeding: Secondary | ICD-10-CM | POA: Diagnosis not present

## 2016-01-06 NOTE — Progress Notes (Signed)
Patient ID: Brandon Simmons, male   DOB: May 07, 1945, 70 y.o.   MRN: [id number]     Primary Care Physician: Brandon Lopes, MD Cardiologist: Brandon Simmons    Brandon Simmons is a 70 y.o. male with a h/o prostrate CA, HL, PAF that was first diagnosed while he was in [place] in fall of 2016. He woke up with heart racing, dyspnea and presented to [facility] ER. He had an echo which showed normal function and reported cardiac catherization without any significant disease. He was placed on toprol from which he  had significant side effects. He was seen by Brandon Simmons and BB was stopped, replaced by cardizem and flecaininde 50 mg bid was started. Of note, his son and brother both have afib.He drank 2 glasses of wine a night, no tobacco, no high amounts caffeine, tries to exercise on a regular basis. Is pending a sleep study,some snoring reported , no apnea.  He did well until 02/24/15, when he had breakthrough afib in the evening with v rate of 124. He presented to [facility] ER and received IV dilt with return of SR. He has since been given 30 mg dilt to use as pill in pocket. No further afib. We further discussed management of breakthrough Afib. Pt was under the impression that he had to go to the ER with every episode of afib. We discussed that it was not life threatening and when it would be appropriate to go to ER. We discussed triggers for afib.  He does describe a cough, intermittent "tickle in throat", that he thinks might be from cardizem or flecainide which I think would be unlikely. He is not on an ACE but of note, he is on injectable prostaglandin powder as needed for  ED, dose increased a few months ago, ? if contributing. He does travel to [place] several times a year and is very concerned re afib episodes with these trips. He also has an older cousin who had an afib ablation and died shortly after the procedure.   Pt returns to [facility], 07/23/15 to discuss episode of afib when on  business trip to [place]  last week. 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 He sat down and rested and there was a school nurse there who recommended that he go to a private hospital and be evaluated. He took his prn cardizem before leaving for the ER. He was seen int the  ER by the chief cardiologist and by that time he had returned to [place]. He reports that the full cost of treatment in ER was $94.00! He has not had any further issues with afib since return but tells me that he has had a breakthrough episode a month ago, for around 2 hours. Continues on flecainide 50 mg bid and asa . Chadsvasc score is one for age, just on ASA.  Returns 7/25 and reports no episodes of afib. He does plan to travel extensively over the next few months and we discussed plan if he should develop afib. Chadsvasc score remains 1, on asa.  F/u 10/19 in the afib clinic, he is doing well. He has traveled extensively in [place] over the last few months and has done well without any afib. He has noticed some mild ankle edema. He continues on flecainide. Chadsvasc score of 1 for age.  Today, he denies symptoms of palpitations, chest pain, shortness of breath, orthopnea, PND, lower extremity edema, dizziness, presyncope, syncope, or neurologic sequela. The patient is tolerating medications without difficulties and is otherwise without complaint today.   Past Medical History:  Diagnosis Date  . Arthritis   . Asthma   . Diverticulitis   . Diverticulosis   . GERD (gastroesophageal reflux disease)   . Hemorrhoids   . History of echocardiogram    a. Echo 10/16 [facility]):  EF 55%, trace MR, mild TR, mild to mod AI, mild dilated Ao root (41 mm)  . Hyperlipemia   . Organic erectile dysfunction   . PAF (paroxysmal atrial fibrillation) ([place])    a. admx to [facility] in [place], [place] 10/16 with AF with RVR >> converted to NSR with IV Dilt;  b. Flecainide started >> ETT neg for pro-arrhythmia   . Prostate cancer ([place]) 04/25/2006   Gleason 3+4=7  . Prostatitis   . S/P cardiac cath    a. LHC at [facility] 10/16:  LM ok, mLAD 30%, LCx ok, dRCA 20%  . S/P radiation therapy 01/19/2014 through 03/05/2014                                                      Prostate bed 6600 cGy in 33 sessions                           Past Surgical History:  Procedure Laterality Date  . APPENDECTOMY     1967  . cataract surgery Left   . COLONOSCOPY    . KNEE ARTHROSCOPY     R  . LARYNX SURGERY     vocal cord lesion- benign  . POLYPECTOMY    . PROSTATECTOMY  04/25/2006    Current Outpatient Prescriptions  Medication Sig Dispense Refill  . acetaminophen (TYLENOL) 500 MG tablet Take 500 mg by mouth every 6 (six) hours as needed for mild pain.    Marland Kitchen albuterol (PROVENTIL HFA;VENTOLIN HFA) 108 (90 BASE) MCG/ACT inhaler Inhale 2 puffs into the lungs every 6 (six) hours as needed for wheezing or shortness of breath.     . Alprostadil (PROSTAGLANDIN E1) POWD 1 mL by Does not apply route as needed (FOR ERECTILE DYSFUNCTION).     Marland Kitchen aspirin 81 MG tablet Take 81 mg by mouth daily.    Marland Kitchen atorvastatin (LIPITOR) 40 MG tablet Take 20 mg by mouth daily.     . cholecalciferol (VITAMIN D) 1000 UNITS tablet Take 1,000 Units by mouth daily.    Marland Kitchen diltiazem (CARDIZEM CD) 120 MG 24 hr capsule Take 1 capsule (120 mg total) by mouth daily. 90 capsule 3  . diltiazem (CARDIZEM) 30 MG tablet Take 1 by mouth as needed for rapid palpitations.  May repeat in 1 hour. 15 tablet 1  . flecainide (TAMBOCOR) 150 MG tablet Take 0.5 tablets (75 mg total) by mouth 2 (two) times daily. 90 tablet 3  . fluticasone (FLONASE) 50 MCG/ACT nasal spray Place 1 spray into the nose as needed.    Marland Kitchen L-Lysine 500 MG TABS Take 1 tablet by mouth daily.    . Mometasone Furoate (ASMANEX 30 METERED DOSES) 110 MCG/INH AEPB  Inhale 1 puff into the lungs every morning. (Patient taking differently: Inhale  1 puff into the lungs at bedtime. )    . montelukast (SINGULAIR) 10 MG tablet Take 10 mg by mouth at bedtime.    . Multiple Vitamin (MULTIVITAMIN) capsule Take 1 capsule by mouth daily.    Marland Kitchen omeprazole (PRILOSEC) 20 MG capsule Take 20 mg by mouth 2 times daily at 12 noon and 4 pm.     . predniSONE (DELTASONE) 10 MG tablet Take 10 mg by mouth as needed (FOR ASTHMA).     . Probiotic Product (ALIGN) 4 MG CAPS Take by mouth 1 day or 1 dose.    . psyllium (REGULOID) 0.52 G capsule Take 0.52 g by mouth 2 (two) times daily.    Marland Kitchen HYDROcodone-acetaminophen (NORCO) 5-325 MG per tablet Take 1 tablet by mouth every 6 (six) hours as needed for severe pain. 2.5-162.5 as needed    . zolpidem (AMBIEN) 10 MG tablet Take 10 mg by mouth at bedtime as needed for sleep.     No current facility-administered medications for this encounter.     No Known Allergies  Social History   Social History  . Marital status: Married    Spouse name: N/A  . Number of children: 2  . Years of education: N/A   Occupational History  . 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 Smoking status: Former Smoker    Packs/day: 1.00    Years: 10.00    Types: Cigarettes  . Smokeless tobacco: Former Systems developer     Comment: 1972  . Alcohol use 4.2 oz/week    7 Glasses of wine per week     Comment: 1 glass wine or beer daily  . Drug use: No  . Sexual activity: Not on file   Other Topics Concern  . Not on file   Social History Narrative   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  Consultant for schools.   Travels over seas     Family History  Problem Relation Age of Onset  . Liver cancer Paternal Grandmother   . Heart disease Paternal Grandfather     Died age 2  . Heart disease Maternal Grandfather     Died age 68  . Diabetes Maternal Grandmother   . Emphysema Father   . Lung cancer Father   . Liver cancer    . Lung cancer    .  Hypertension Mother   . Atrial fibrillation Son     ROS- All systems are reviewed and negative except as per the HPI above  Physical Exam: Vitals:   01/06/16 1000  BP: 124/76  Pulse: 66  Weight: 218 lb 3.2 oz (99 kg)  Height: 5\' 10"  (1.778 m)    GEN- The patient is well appearing, alert and oriented x 3 today.   Head- normocephalic, atraumatic Eyes-  Sclera clear, conjunctiva pink Ears- hearing intact Oropharynx- clear Neck- supple, no JVP Lymph- no cervical lymphadenopathy Lungs- Clear to ausculation bilaterally, normal work of breathing Heart- Regular rate and rhythm, no murmurs, rubs or gallops, PMI not laterally displaced GI- soft, NT, ND, + BS Extremities- no clubbing, cyanosis, or edema MS- no significant deformity or atrophy Skin- no rash or lesion Psych- euthymic mood, full affect Neuro- strength and sensation are intact  EKG- NSR at 66 bpm, pr int 180 ms, qrs int 96 ms, qtc 404 ms 10/16-Echo reviewed from [facility]- Normal EF at 55%, normal left atrial size 10/16-  Left heart cath revealed non obstructive disease  Assessment and Plan: 1. Paroxysmal atrial fibrillation Currently on flecainide 75 mg/ daily cardizem and has 30 mg prn cardizem to use as well Very low afib burden  2.Lifestyle factors Decrease alcohol to no more than  2 drinks a week Sleep study done and no apnea Encouraged regular exercise  3. New LLE May be from cardizem but will repeat echo, will notify pt of results  F/u with Dr. Percival Spanish as scheduled 07/04/16 afib clinic as needed  Butch Penny C. Elmer Boutelle, [facility] [address] [place], [place] [address] [phone]

## 2016-01-06 NOTE — Patient Instructions (Signed)
See you in 6 months. We will send you a letter to call.

## 2016-01-07 ENCOUNTER — Ambulatory Visit (HOSPITAL_COMMUNITY): Payer: Self-pay | Admitting: Nurse Practitioner

## 2016-01-21 DIAGNOSIS — J3089 Other allergic rhinitis: Secondary | ICD-10-CM | POA: Diagnosis not present

## 2016-01-21 DIAGNOSIS — J301 Allergic rhinitis due to pollen: Secondary | ICD-10-CM | POA: Diagnosis not present

## 2016-01-24 DIAGNOSIS — J3089 Other allergic rhinitis: Secondary | ICD-10-CM | POA: Diagnosis not present

## 2016-01-24 DIAGNOSIS — J301 Allergic rhinitis due to pollen: Secondary | ICD-10-CM | POA: Diagnosis not present

## 2016-01-28 ENCOUNTER — Ambulatory Visit (HOSPITAL_COMMUNITY)
Admission: RE | Admit: 2016-01-28 | Discharge: 2016-01-28 | Disposition: A | Discharge status: Home / Self Care | Payer: Medicare Other | Source: Ambulatory Visit | Attending: Nurse Practitioner | Admitting: Nurse Practitioner

## 2016-01-28 DIAGNOSIS — Z87891 Personal history of nicotine dependence: Secondary | ICD-10-CM | POA: Insufficient documentation

## 2016-01-28 DIAGNOSIS — I340 Nonrheumatic mitral (valve) insufficiency: Secondary | ICD-10-CM | POA: Insufficient documentation

## 2016-01-28 DIAGNOSIS — I071 Rheumatic tricuspid insufficiency: Secondary | ICD-10-CM | POA: Insufficient documentation

## 2016-01-28 DIAGNOSIS — I4891 Unspecified atrial fibrillation: Secondary | ICD-10-CM | POA: Insufficient documentation

## 2016-01-28 DIAGNOSIS — I480 Paroxysmal atrial fibrillation: Secondary | ICD-10-CM

## 2016-01-28 DIAGNOSIS — J301 Allergic rhinitis due to pollen: Secondary | ICD-10-CM | POA: Diagnosis not present

## 2016-01-28 DIAGNOSIS — J3089 Other allergic rhinitis: Secondary | ICD-10-CM | POA: Diagnosis not present

## 2016-01-28 DIAGNOSIS — I351 Nonrheumatic aortic (valve) insufficiency: Secondary | ICD-10-CM | POA: Diagnosis not present

## 2016-01-28 DIAGNOSIS — C61 Malignant neoplasm of prostate: Secondary | ICD-10-CM | POA: Diagnosis not present

## 2016-01-28 NOTE — Progress Notes (Signed)
  Echocardiogram 2D Echocardiogram has been performed.  Tresa Res 01/28/2016, 3:01 PM

## 2016-01-31 ENCOUNTER — Encounter (HOSPITAL_COMMUNITY): Payer: Self-pay | Admitting: Nurse Practitioner

## 2016-01-31 DIAGNOSIS — J3089 Other allergic rhinitis: Secondary | ICD-10-CM | POA: Diagnosis not present

## 2016-01-31 DIAGNOSIS — J301 Allergic rhinitis due to pollen: Secondary | ICD-10-CM | POA: Diagnosis not present

## 2016-02-02 DIAGNOSIS — J3089 Other allergic rhinitis: Secondary | ICD-10-CM | POA: Diagnosis not present

## 2016-02-02 DIAGNOSIS — C61 Malignant neoplasm of prostate: Secondary | ICD-10-CM | POA: Diagnosis not present

## 2016-02-02 DIAGNOSIS — N5201 Erectile dysfunction due to arterial insufficiency: Secondary | ICD-10-CM | POA: Diagnosis not present

## 2016-02-02 DIAGNOSIS — J301 Allergic rhinitis due to pollen: Secondary | ICD-10-CM | POA: Diagnosis not present

## 2016-02-06 DIAGNOSIS — J069 Acute upper respiratory infection, unspecified: Secondary | ICD-10-CM | POA: Diagnosis not present

## 2016-02-14 DIAGNOSIS — J45998 Other asthma: Secondary | ICD-10-CM | POA: Diagnosis not present

## 2016-02-14 DIAGNOSIS — Z6831 Body mass index (BMI) 31.0-31.9, adult: Secondary | ICD-10-CM | POA: Diagnosis not present

## 2016-02-14 DIAGNOSIS — R05 Cough: Secondary | ICD-10-CM | POA: Diagnosis not present

## 2016-02-14 DIAGNOSIS — J302 Other seasonal allergic rhinitis: Secondary | ICD-10-CM | POA: Diagnosis not present

## 2016-02-14 DIAGNOSIS — K219 Gastro-esophageal reflux disease without esophagitis: Secondary | ICD-10-CM | POA: Diagnosis not present

## 2016-02-15 DIAGNOSIS — J301 Allergic rhinitis due to pollen: Secondary | ICD-10-CM | POA: Diagnosis not present

## 2016-02-15 DIAGNOSIS — J3089 Other allergic rhinitis: Secondary | ICD-10-CM | POA: Diagnosis not present

## 2016-02-21 DIAGNOSIS — J3089 Other allergic rhinitis: Secondary | ICD-10-CM | POA: Diagnosis not present

## 2016-02-21 DIAGNOSIS — J301 Allergic rhinitis due to pollen: Secondary | ICD-10-CM | POA: Diagnosis not present

## 2016-02-24 DIAGNOSIS — J301 Allergic rhinitis due to pollen: Secondary | ICD-10-CM | POA: Diagnosis not present

## 2016-02-24 DIAGNOSIS — J3089 Other allergic rhinitis: Secondary | ICD-10-CM | POA: Diagnosis not present

## 2016-03-09 DIAGNOSIS — J3089 Other allergic rhinitis: Secondary | ICD-10-CM | POA: Diagnosis not present

## 2016-03-09 DIAGNOSIS — J301 Allergic rhinitis due to pollen: Secondary | ICD-10-CM | POA: Diagnosis not present

## 2016-03-16 DIAGNOSIS — J3089 Other allergic rhinitis: Secondary | ICD-10-CM | POA: Diagnosis not present

## 2016-03-16 DIAGNOSIS — J301 Allergic rhinitis due to pollen: Secondary | ICD-10-CM | POA: Diagnosis not present

## 2016-03-23 DIAGNOSIS — J301 Allergic rhinitis due to pollen: Secondary | ICD-10-CM | POA: Diagnosis not present

## 2016-03-23 DIAGNOSIS — J3089 Other allergic rhinitis: Secondary | ICD-10-CM | POA: Diagnosis not present

## 2016-04-10 DIAGNOSIS — J301 Allergic rhinitis due to pollen: Secondary | ICD-10-CM | POA: Diagnosis not present

## 2016-04-10 DIAGNOSIS — J3089 Other allergic rhinitis: Secondary | ICD-10-CM | POA: Diagnosis not present

## 2016-04-13 DIAGNOSIS — J4530 Mild persistent asthma, uncomplicated: Secondary | ICD-10-CM | POA: Diagnosis not present

## 2016-04-13 DIAGNOSIS — J301 Allergic rhinitis due to pollen: Secondary | ICD-10-CM | POA: Diagnosis not present

## 2016-04-13 DIAGNOSIS — J3089 Other allergic rhinitis: Secondary | ICD-10-CM | POA: Diagnosis not present

## 2016-04-14 ENCOUNTER — Encounter: Payer: Self-pay | Admitting: Internal Medicine

## 2016-04-28 DIAGNOSIS — J301 Allergic rhinitis due to pollen: Secondary | ICD-10-CM | POA: Diagnosis not present

## 2016-04-28 DIAGNOSIS — L814 Other melanin hyperpigmentation: Secondary | ICD-10-CM | POA: Diagnosis not present

## 2016-04-28 DIAGNOSIS — D225 Melanocytic nevi of trunk: Secondary | ICD-10-CM | POA: Diagnosis not present

## 2016-04-28 DIAGNOSIS — D2262 Melanocytic nevi of left upper limb, including shoulder: Secondary | ICD-10-CM | POA: Diagnosis not present

## 2016-04-28 DIAGNOSIS — B078 Other viral warts: Secondary | ICD-10-CM | POA: Diagnosis not present

## 2016-04-28 DIAGNOSIS — J3089 Other allergic rhinitis: Secondary | ICD-10-CM | POA: Diagnosis not present

## 2016-04-28 DIAGNOSIS — L821 Other seborrheic keratosis: Secondary | ICD-10-CM | POA: Diagnosis not present

## 2016-04-28 DIAGNOSIS — D2261 Melanocytic nevi of right upper limb, including shoulder: Secondary | ICD-10-CM | POA: Diagnosis not present

## 2016-04-30 ENCOUNTER — Telehealth: Payer: Self-pay | Admitting: Internal Medicine

## 2016-04-30 MED ORDER — OSELTAMIVIR PHOSPHATE 75 MG PO CAPS: 75.0000 mg | ORAL_CAPSULE | Freq: Two times a day (BID) | ORAL | 0 refills | Status: AC

## 2016-04-30 NOTE — Telephone Encounter (Signed)
  04/30/16 6:50 PM  Patient called cardiology pager.  He has history of paroxysmal atrial fibrillation. Recently traveling with possible flu contacts. Now has fever and flulike symptoms. Aditionally, he is in atrial fibrillation that did not respond to first prn dose of Diltiazem.  HR 80 in Afib BP 118/84  Afib likely exacerbated by recent illness. Recommended he take another dose of Diltiazem, remain well hydrated, and come to ED if symptoms worsen. 5 day prescription forTamiflu sent to local pharmacy.  Soyla Murphy, MD

## 2016-05-05 DIAGNOSIS — I4891 Unspecified atrial fibrillation: Secondary | ICD-10-CM | POA: Diagnosis not present

## 2016-05-05 DIAGNOSIS — R05 Cough: Secondary | ICD-10-CM | POA: Diagnosis not present

## 2016-05-05 DIAGNOSIS — Z6831 Body mass index (BMI) 31.0-31.9, adult: Secondary | ICD-10-CM | POA: Diagnosis not present

## 2016-05-10 DIAGNOSIS — Z0000 Encounter for general adult medical examination without abnormal findings: Secondary | ICD-10-CM | POA: Diagnosis not present

## 2016-05-10 DIAGNOSIS — E784 Other hyperlipidemia: Secondary | ICD-10-CM | POA: Diagnosis not present

## 2016-05-16 DIAGNOSIS — Z6831 Body mass index (BMI) 31.0-31.9, adult: Secondary | ICD-10-CM | POA: Diagnosis not present

## 2016-05-16 DIAGNOSIS — I4891 Unspecified atrial fibrillation: Secondary | ICD-10-CM | POA: Diagnosis not present

## 2016-05-16 DIAGNOSIS — J45998 Other asthma: Secondary | ICD-10-CM | POA: Diagnosis not present

## 2016-05-16 DIAGNOSIS — Z1389 Encounter for screening for other disorder: Secondary | ICD-10-CM | POA: Diagnosis not present

## 2016-05-16 DIAGNOSIS — J3089 Other allergic rhinitis: Secondary | ICD-10-CM | POA: Diagnosis not present

## 2016-05-16 DIAGNOSIS — I2510 Atherosclerotic heart disease of native coronary artery without angina pectoris: Secondary | ICD-10-CM | POA: Diagnosis not present

## 2016-05-16 DIAGNOSIS — Z0000 Encounter for general adult medical examination without abnormal findings: Secondary | ICD-10-CM | POA: Diagnosis not present

## 2016-05-16 DIAGNOSIS — R05 Cough: Secondary | ICD-10-CM | POA: Diagnosis not present

## 2016-05-16 DIAGNOSIS — C61 Malignant neoplasm of prostate: Secondary | ICD-10-CM | POA: Diagnosis not present

## 2016-05-16 DIAGNOSIS — E784 Other hyperlipidemia: Secondary | ICD-10-CM | POA: Diagnosis not present

## 2016-05-16 DIAGNOSIS — E668 Other obesity: Secondary | ICD-10-CM | POA: Diagnosis not present

## 2016-05-17 DIAGNOSIS — Z1212 Encounter for screening for malignant neoplasm of rectum: Secondary | ICD-10-CM | POA: Diagnosis not present

## 2016-05-18 DIAGNOSIS — J301 Allergic rhinitis due to pollen: Secondary | ICD-10-CM | POA: Diagnosis not present

## 2016-05-18 DIAGNOSIS — J3089 Other allergic rhinitis: Secondary | ICD-10-CM | POA: Diagnosis not present

## 2016-06-05 DIAGNOSIS — J3089 Other allergic rhinitis: Secondary | ICD-10-CM | POA: Diagnosis not present

## 2016-06-05 DIAGNOSIS — J301 Allergic rhinitis due to pollen: Secondary | ICD-10-CM | POA: Diagnosis not present

## 2016-06-07 DIAGNOSIS — J301 Allergic rhinitis due to pollen: Secondary | ICD-10-CM | POA: Diagnosis not present

## 2016-06-07 DIAGNOSIS — J3089 Other allergic rhinitis: Secondary | ICD-10-CM | POA: Diagnosis not present

## 2016-06-12 DIAGNOSIS — R600 Localized edema: Secondary | ICD-10-CM | POA: Diagnosis not present

## 2016-06-12 DIAGNOSIS — Z6831 Body mass index (BMI) 31.0-31.9, adult: Secondary | ICD-10-CM | POA: Diagnosis not present

## 2016-06-12 DIAGNOSIS — J3089 Other allergic rhinitis: Secondary | ICD-10-CM | POA: Diagnosis not present

## 2016-06-12 DIAGNOSIS — J301 Allergic rhinitis due to pollen: Secondary | ICD-10-CM | POA: Diagnosis not present

## 2016-06-12 DIAGNOSIS — I2510 Atherosclerotic heart disease of native coronary artery without angina pectoris: Secondary | ICD-10-CM | POA: Diagnosis not present

## 2016-06-14 DIAGNOSIS — J3089 Other allergic rhinitis: Secondary | ICD-10-CM | POA: Diagnosis not present

## 2016-06-14 DIAGNOSIS — J301 Allergic rhinitis due to pollen: Secondary | ICD-10-CM | POA: Diagnosis not present

## 2016-06-25 NOTE — Progress Notes (Signed)
Cardiology Office Note   Date:  06/27/2016   ID:  Brandon Simmons, DOB Feb 13, 1946, MRN [id number]  PCP:  [name], MD  Cardiologist:   Minus Breeding, MD   Chief Complaint  Patient presents with  . Atrial Fibrillation      History of Present Illness: Brandon Simmons is a 71 y.o. male who presents for evaluation of atrial fib. Since I last saw him he has been followed in the atrial fib clinic.  He had an echo which was normal with an EF of 65%.  Since he was last seen he did have an episode of atrial fibrillation that lasted for about 8 hours actually took short-acting Cardizem was in about an 8 hour period and became hypotensive and called EMS. However, he subsequently converted to sinus rhythm and did not get transported to the emergency room. He otherwise has done well. He has about 3 or 4 episodes of atrial fib lasting 3 or 4 hours at a time per year. However, this is stable pattern. He had his last episode when he was traveling in [place] recently. He otherwise does well. He exercises routinely. The patient denies any new symptoms such as chest discomfort, neck or arm discomfort. There has been no new shortness of breath, PND or orthopnea. There have been no reported palpitations, presyncope or syncope.   Past Medical History:  Diagnosis Date  . Arthritis   . Asthma   . Diverticulitis   . Diverticulosis   . GERD (gastroesophageal reflux disease)   . Hemorrhoids   . History of echocardiogram    a. Echo 10/16 [facility]):  EF 55%, trace MR, mild TR, mild to mod AI, mild dilated Ao root (41 mm)  . Hyperlipemia   . Organic erectile dysfunction   . PAF (paroxysmal atrial fibrillation) ([place])    a. admx to [facility] in [place], [place] 10/16 with AF with RVR >> converted to NSR with IV Dilt;  b. Flecainide started >> ETT neg for pro-arrhythmia   . Prostate cancer ([place]) 04/25/2006   Gleason 3+4=7  . Prostatitis   . S/P cardiac cath    a. LHC at [facility] 10/16:  LM ok, mLAD 30%, LCx ok, dRCA 20%  . S/P radiation therapy 01/19/2014 through 03/05/2014                                                      Prostate bed 6600 cGy in 33 sessions                            Past Surgical History:  Procedure Laterality Date  . APPENDECTOMY     1967  . cataract surgery Left   . COLONOSCOPY    . KNEE ARTHROSCOPY     R  . LARYNX SURGERY     vocal cord lesion- benign  . POLYPECTOMY    . PROSTATECTOMY  04/25/2006     Current Outpatient Prescriptions  Medication Sig Dispense Refill  . acetaminophen (TYLENOL) 500 MG tablet Take 500 mg by mouth every 6 (six) hours as needed for mild pain.    Marland Kitchen albuterol (PROVENTIL HFA;VENTOLIN HFA) 108 (90 BASE) MCG/ACT inhaler Inhale 2 puffs into the lungs every 6 (six) hours as needed  for wheezing or shortness of breath.     . Alprostadil (PROSTAGLANDIN E1) POWD 1 mL by Does not apply route as needed (FOR ERECTILE DYSFUNCTION).     Marland Kitchen aspirin 81 MG tablet Take 81 mg by mouth daily.    Marland Kitchen atorvastatin (LIPITOR) 40 MG tablet Take 20 mg by mouth daily.     . cholecalciferol (VITAMIN D) 1000 UNITS tablet Take 1,000 Units by mouth daily.    Marland Kitchen diltiazem (CARDIZEM CD) 120 MG 24 hr capsule Take 1 capsule (120 mg total) by mouth daily. 90 capsule 3  . diltiazem (CARDIZEM) 30 MG tablet Take 1 by mouth as needed for rapid palpitations.  May repeat in 1 hour. 15 tablet 1  . doxycycline (VIBRA-TABS) 100 MG tablet Take 100 mg by mouth 2 (two) times daily.    . flecainide (TAMBOCOR) 150 MG tablet Take 0.5 tablets (75 mg total) by mouth 2 (two) times daily. 90 tablet 3  . fluticasone (FLONASE) 50 MCG/ACT nasal spray Place 1 spray into the nose as needed.    . Glucosamine-Chondroit-Vit C-Mn (GLUCOSAMINE 1500 COMPLEX) CAPS Take 1 capsule by mouth daily.    Marland Kitchen HYDROcodone-acetaminophen (NORCO) 5-325 MG per tablet Take 1 tablet by mouth every 6 (six) hours as needed for severe pain. 2.5-162.5 as needed    . L-Lysine 500 MG TABS  Take 1 tablet by mouth daily.    . Mometasone Furoate (ASMANEX 30 METERED DOSES) 110 MCG/INH AEPB Inhale 1 puff into the lungs every morning. (Patient taking differently: Inhale 1 puff into the lungs at bedtime. )    . montelukast (SINGULAIR) 10 MG tablet Take 10 mg by mouth at bedtime.    . Multiple Vitamin (MULTIVITAMIN) capsule Take 1 capsule by mouth daily.    Marland Kitchen omeprazole (PRILOSEC) 20 MG capsule Take 20 mg by mouth 2 times daily at 12 noon and 4 pm.     . predniSONE (DELTASONE) 10 MG tablet Take 10 mg by mouth as needed (FOR ASTHMA).     . Probiotic Product (ALIGN) 4 MG CAPS Take by mouth 1 day or 1 dose.    . psyllium (REGULOID) 0.52 G capsule Take 0.52 g by mouth 2 (two) times daily.    Marland Kitchen zolpidem (AMBIEN) 10 MG tablet Take 10 mg by mouth at bedtime as needed for sleep.     No current facility-administered medications for this visit.     Allergies:   Patient has no known allergies.    ROS:  Please see the history of present illness.   Otherwise, review of systems are .  All other systems are reviewed and negative.    PHYSICAL EXAM: VS:  BP 130/74   Pulse (!) 58   Ht 5\' 10"  (1.778 m)   Wt 217 lb (98.4 kg)   BMI 31.14 kg/m  , BMI Body mass index is 31.14 kg/m. GENERAL:  Well appearing NECK:  No jugular venous distention, waveform within normal limits, carotid upstroke brisk and symmetric, no bruits, no thyromegaly LUNGS:  Clear to auscultation bilaterally BACK:  No CVA tenderness CHEST:  Unremarkable HEART:  PMI not displaced or sustained,S1 and S2 within normal limits, no S3, no S4, no clicks, no rubs, no murmurs ABD:  Flat, positive bowel sounds normal in frequency in pitch, no bruits, no rebound, no guarding, no midline pulsatile mass, no hepatomegaly, no splenomegaly EXT:  2 plus pulses throughout, no edema, no cyanosis no clubbing  EKG:  NA  Recent Labs: No results found for requested  labs within last 8760 hours.    Lipid Panel No results found for: CHOL, TRIG,  HDL, CHOLHDL, VLDL, LDLCALC, LDLDIRECT    Wt Readings from Last 3 Encounters:  06/27/16 217 lb (98.4 kg)  01/06/16 218 lb 3.2 oz (99 kg)  10/12/15 213 lb 12.8 oz (97 kg)      Other studies Reviewed: Additional studies/ records that were reviewed today include:  None Review of the above records demonstrates:      ASSESSMENT AND PLAN:  ATRIAL FIB:    Mr. Hoy Fallert has a CHA2DS2 - VASc score of 1.   The patient is doing well with current management.   He will remain on the current dose of flecainide and when necessary Cardizem. If he has any increased symptoms I will increase his flecainide the future.      Current medicines are reviewed at length with the patient today.  The patient does not have concerns regarding medicines.  The following changes have been made:  None  Labs/ tests ordered today include: None     Disposition:   FU with me in 12 months.    Signed, Minus Breeding, MD  06/27/2016 10:20 PM    [facility]

## 2016-06-27 ENCOUNTER — Ambulatory Visit (INDEPENDENT_AMBULATORY_CARE_PROVIDER_SITE_OTHER): Payer: Medicare Other | Admitting: Cardiology

## 2016-06-27 ENCOUNTER — Encounter: Payer: Self-pay | Admitting: Cardiology

## 2016-06-27 VITALS — BP 130/74 | HR 58 | Ht 70.0 in | Wt 217.0 lb

## 2016-06-27 DIAGNOSIS — I480 Paroxysmal atrial fibrillation: Secondary | ICD-10-CM | POA: Diagnosis not present

## 2016-06-27 NOTE — Patient Instructions (Signed)
Your physician wants you to follow-up in: ONE YEAR WITH DR HOCHREIN You will receive a reminder letter in the mail two months in advance. If you don't receive a letter, please call our office to schedule the follow-up appointment.   If you need a refill on your cardiac medications before your next appointment, please call your pharmacy.  

## 2016-06-30 DIAGNOSIS — J3089 Other allergic rhinitis: Secondary | ICD-10-CM | POA: Diagnosis not present

## 2016-06-30 DIAGNOSIS — J301 Allergic rhinitis due to pollen: Secondary | ICD-10-CM | POA: Diagnosis not present

## 2016-07-11 ENCOUNTER — Other Ambulatory Visit: Payer: Self-pay | Admitting: Physician Assistant

## 2016-07-14 DIAGNOSIS — J301 Allergic rhinitis due to pollen: Secondary | ICD-10-CM | POA: Diagnosis not present

## 2016-07-14 DIAGNOSIS — J3089 Other allergic rhinitis: Secondary | ICD-10-CM | POA: Diagnosis not present

## 2016-07-28 DIAGNOSIS — J301 Allergic rhinitis due to pollen: Secondary | ICD-10-CM | POA: Diagnosis not present

## 2016-07-28 DIAGNOSIS — J3089 Other allergic rhinitis: Secondary | ICD-10-CM | POA: Diagnosis not present

## 2016-08-02 DIAGNOSIS — C61 Malignant neoplasm of prostate: Secondary | ICD-10-CM | POA: Diagnosis not present

## 2016-08-09 DIAGNOSIS — C61 Malignant neoplasm of prostate: Secondary | ICD-10-CM | POA: Diagnosis not present

## 2016-08-09 DIAGNOSIS — N5201 Erectile dysfunction due to arterial insufficiency: Secondary | ICD-10-CM | POA: Diagnosis not present

## 2016-08-10 DIAGNOSIS — J3089 Other allergic rhinitis: Secondary | ICD-10-CM | POA: Diagnosis not present

## 2016-08-10 DIAGNOSIS — J301 Allergic rhinitis due to pollen: Secondary | ICD-10-CM | POA: Diagnosis not present

## 2016-08-23 ENCOUNTER — Inpatient Hospital Stay (HOSPITAL_COMMUNITY): Admission: RE | Admit: 2016-08-23 | Payer: Self-pay | Source: Ambulatory Visit | Admitting: Nurse Practitioner

## 2016-08-23 DIAGNOSIS — J3089 Other allergic rhinitis: Secondary | ICD-10-CM | POA: Diagnosis not present

## 2016-08-23 DIAGNOSIS — J301 Allergic rhinitis due to pollen: Secondary | ICD-10-CM | POA: Diagnosis not present

## 2016-08-29 DIAGNOSIS — M79672 Pain in left foot: Secondary | ICD-10-CM | POA: Diagnosis not present

## 2016-08-29 DIAGNOSIS — Z6831 Body mass index (BMI) 31.0-31.9, adult: Secondary | ICD-10-CM | POA: Diagnosis not present

## 2016-08-29 DIAGNOSIS — B070 Plantar wart: Secondary | ICD-10-CM | POA: Diagnosis not present

## 2016-08-30 ENCOUNTER — Other Ambulatory Visit (HOSPITAL_COMMUNITY): Payer: Self-pay | Admitting: Nurse Practitioner

## 2016-08-30 DIAGNOSIS — I480 Paroxysmal atrial fibrillation: Secondary | ICD-10-CM

## 2016-09-11 DIAGNOSIS — J3089 Other allergic rhinitis: Secondary | ICD-10-CM | POA: Diagnosis not present

## 2016-09-11 DIAGNOSIS — J301 Allergic rhinitis due to pollen: Secondary | ICD-10-CM | POA: Diagnosis not present

## 2016-09-13 ENCOUNTER — Ambulatory Visit (HOSPITAL_COMMUNITY)
Admission: RE | Admit: 2016-09-13 | Discharge: 2016-09-13 | Disposition: A | Discharge status: Home / Self Care | Payer: Medicare Other | Source: Ambulatory Visit | Attending: Nurse Practitioner | Admitting: Nurse Practitioner

## 2016-09-13 ENCOUNTER — Encounter (HOSPITAL_COMMUNITY): Payer: Self-pay | Admitting: Nurse Practitioner

## 2016-09-13 VITALS — BP 124/72 | HR 58 | Ht 70.0 in | Wt 218.6 lb

## 2016-09-13 DIAGNOSIS — Z8249 Family history of ischemic heart disease and other diseases of the circulatory system: Secondary | ICD-10-CM | POA: Insufficient documentation

## 2016-09-13 DIAGNOSIS — Z923 Personal history of irradiation: Secondary | ICD-10-CM | POA: Insufficient documentation

## 2016-09-13 DIAGNOSIS — Z825 Family history of asthma and other chronic lower respiratory diseases: Secondary | ICD-10-CM | POA: Insufficient documentation

## 2016-09-13 DIAGNOSIS — E785 Hyperlipidemia, unspecified: Secondary | ICD-10-CM | POA: Insufficient documentation

## 2016-09-13 DIAGNOSIS — I480 Paroxysmal atrial fibrillation: Secondary | ICD-10-CM | POA: Diagnosis not present

## 2016-09-13 DIAGNOSIS — Z7951 Long term (current) use of inhaled steroids: Secondary | ICD-10-CM | POA: Diagnosis not present

## 2016-09-13 DIAGNOSIS — K219 Gastro-esophageal reflux disease without esophagitis: Secondary | ICD-10-CM | POA: Insufficient documentation

## 2016-09-13 DIAGNOSIS — Z8546 Personal history of malignant neoplasm of prostate: Secondary | ICD-10-CM | POA: Insufficient documentation

## 2016-09-13 DIAGNOSIS — J301 Allergic rhinitis due to pollen: Secondary | ICD-10-CM | POA: Diagnosis not present

## 2016-09-13 DIAGNOSIS — Z79899 Other long term (current) drug therapy: Secondary | ICD-10-CM | POA: Insufficient documentation

## 2016-09-13 DIAGNOSIS — M1990 Unspecified osteoarthritis, unspecified site: Secondary | ICD-10-CM | POA: Insufficient documentation

## 2016-09-13 DIAGNOSIS — Z801 Family history of malignant neoplasm of trachea, bronchus and lung: Secondary | ICD-10-CM | POA: Diagnosis not present

## 2016-09-13 DIAGNOSIS — Z7982 Long term (current) use of aspirin: Secondary | ICD-10-CM | POA: Insufficient documentation

## 2016-09-13 DIAGNOSIS — J45909 Unspecified asthma, uncomplicated: Secondary | ICD-10-CM | POA: Insufficient documentation

## 2016-09-13 DIAGNOSIS — Z87891 Personal history of nicotine dependence: Secondary | ICD-10-CM | POA: Diagnosis not present

## 2016-09-13 DIAGNOSIS — J3089 Other allergic rhinitis: Secondary | ICD-10-CM | POA: Diagnosis not present

## 2016-09-13 NOTE — Progress Notes (Signed)
Patient ID: Brandon Simmons, male   DOB: Aug 29, 1945, 71 y.o.   MRN: [id number]     Primary Care Physician: Leanna Battles, MD Cardiologist: Dr.Hochrein    Brandon Simmons is a 71 y.o. male with a h/o prostrate CA, HL, PAF that was first diagnosed while he was in [place] in fall of 2016. He woke up with heart racing, dyspnea and presented to [facility] ER. He had an echo which showed normal function and reported cardiac catherization without any significant disease. He was placed on toprol from which he  had significant side effects. He was seen by Richardson Dopp and BB was stopped, replaced by cardizem and flecaininde 50 mg bid was started. Of note, his son and brother both have afib.He drank 2 glasses of wine a night, no tobacco, no high amounts caffeine, tries to exercise on a regular basis. Is pending a sleep study,some snoring reported , no apnea.  He did well until 02/24/15, when he had breakthrough afib in the evening with v rate of 124. He presented to [facility] ER and received IV dilt with return of SR. He has since been given 30 mg dilt to use as pill in pocket. No further afib. We further discussed management of breakthrough Afib. Pt was under the impression that he had to go to the ER with every episode of afib. We discussed that it was not life threatening and when it would be appropriate to go to ER. We discussed triggers for afib.  He does describe a cough, intermittent "tickle in throat", that he thinks might be from cardizem or flecainide which I think would be unlikely. He is not on an ACE but of note, he is on injectable prostaglandin powder as needed for  ED, dose increased a few months ago, ? if contributing. He does travel to [place] several times a year and is very concerned re afib episodes with these trips. He also has an older cousin who had an afib ablation and died shortly after the procedure.   Pt returns to [facility], 07/23/15 to discuss episode of afib when on  business trip to [place]  last week. 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 He sat down and rested and there was a school nurse there who recommended that he go to a private hospital and be evaluated. He took his prn cardizem before leaving for the ER. He was seen int the  ER by the chief cardiologist and by that time he had returned to [place]. He reports that the full cost of treatment in ER was $94.00! He has not had any further issues with afib since return but tells me that he has had a breakthrough episode a month ago, for around 2 hours. Continues on flecainide 50 mg bid and asa . Chadsvasc score is one for age, just on ASA.  Returns 7/25 and reports no episodes of afib. He does plan to travel extensively over the next few months and we discussed plan if he should develop afib. Chadsvasc score remains 1, on asa.  F/u 10/19 in the afib clinic, he is doing well. He has traveled extensively in [place] over the last few months and has done well without any afib. He has noticed some mild ankle edema. He continues on flecainide. Chadsvasc score of 1 for age.  F/u in afib clinic, 6/27, he feels well. No afib in some time. Notices some shortness of breath walking up inclines. We discussed that this may be from CCB holding down HR with activity. He had a cardiac cath in 2016 free of any CAD.  Today, he denies symptoms of palpitations, chest pain, shortness of breath, orthopnea, PND, lower extremity edema, dizziness, presyncope, syncope, or neurologic sequela. The patient is tolerating medications without difficulties and is otherwise without complaint today.   Past Medical History:  Diagnosis Date  . Arthritis   . Asthma   . Diverticulitis   . Diverticulosis   . GERD (gastroesophageal reflux disease)   . Hemorrhoids   . History of echocardiogram      a. Echo 10/16 [facility]):  EF 55%, trace MR, mild TR, mild to mod AI, mild dilated Ao root (41 mm)  . Hyperlipemia   . Organic erectile dysfunction   . PAF (paroxysmal atrial fibrillation) ([place])    a. admx to [facility] in [place], [place] 10/16 with AF with RVR >> converted to NSR with IV Dilt;  b. Flecainide started >> ETT neg for pro-arrhythmia   . Prostate cancer ([place]) 04/25/2006   Gleason 3+4=7  . Prostatitis   . S/P cardiac cath    a. LHC at [facility] 10/16:  LM ok, mLAD 30%, LCx ok, dRCA 20%  . S/P radiation therapy 01/19/2014 through 03/05/2014                                                      Prostate bed 6600 cGy in 33 sessions                           Past Surgical History:  Procedure Laterality Date  . APPENDECTOMY     1967  . cataract surgery Left   . COLONOSCOPY    . KNEE ARTHROSCOPY     R  . LARYNX SURGERY     vocal cord lesion- benign  . POLYPECTOMY    . PROSTATECTOMY  04/25/2006    Current Outpatient Prescriptions  Medication Sig Dispense Refill  . acetaminophen (TYLENOL) 500 MG tablet Take 500 mg by mouth every 6 (six) hours as needed for mild pain.    Marland Kitchen albuterol (PROVENTIL HFA;VENTOLIN HFA) 108 (90 BASE) MCG/ACT inhaler Inhale 2 puffs into the lungs every 6 (six) hours as needed for wheezing or shortness of breath.     Marland Kitchen aspirin 81 MG tablet Take 81 mg by mouth daily.    Marland Kitchen atorvastatin (LIPITOR) 40 MG tablet Take 20 mg by mouth daily.     Marland Kitchen CARTIA XT 120 MG 24 hr capsule TAKE ONE CAPSULE BY MOUTH EVERY DAY 90 capsule 3  . cholecalciferol (VITAMIN D) 1000 UNITS tablet Take 1,000 Units by mouth daily.    Marland Kitchen diltiazem (CARDIZEM) 30 MG tablet Take 1 by mouth as needed for rapid palpitations.  May repeat in 1 hour. 15 tablet 1  . flecainide (TAMBOCOR) 150 MG tablet TAKE 1/2 TABLET BY MOUTH TWICE DAILY 90 tablet 2  . fluticasone (FLONASE) 50 MCG/ACT nasal spray Place 1 spray into the nose as needed.    . Glucosamine-Chondroit-Vit C-Mn (GLUCOSAMINE  1500 COMPLEX) CAPS Take 1 capsule by mouth daily.    Marland Kitchen HYDROcodone-acetaminophen (  NORCO) 5-325 MG per tablet Take 1 tablet by mouth every 6 (six) hours as needed for severe pain. 2.5-162.5 as needed    . L-Lysine 500 MG TABS Take 1 tablet by mouth daily.    . Mometasone Furoate (ASMANEX 30 METERED DOSES) 110 MCG/INH AEPB Inhale 1 puff into the lungs every morning. (Patient taking differently: Inhale 1 puff into the lungs at bedtime. )    . montelukast (SINGULAIR) 10 MG tablet Take 10 mg by mouth at bedtime.    . Multiple Vitamin (MULTIVITAMIN) capsule Take 1 capsule by mouth daily.    Marland Kitchen omeprazole (PRILOSEC) 20 MG capsule Take 20 mg by mouth 2 times daily at 12 noon and 4 pm.     . Probiotic Product (ALIGN) 4 MG CAPS Take by mouth 1 day or 1 dose.    . psyllium (REGULOID) 0.52 G capsule Take 0.52 g by mouth 2 (two) times daily.    Marland Kitchen zolpidem (AMBIEN) 10 MG tablet Take 10 mg by mouth at bedtime as needed for sleep.    . Alprostadil (PROSTAGLANDIN E1) POWD 1 mL by Does not apply route as needed (FOR ERECTILE DYSFUNCTION).     Marland Kitchen predniSONE (DELTASONE) 10 MG tablet Take 10 mg by mouth as needed (FOR ASTHMA).      No current facility-administered medications for this encounter.     No Known Allergies  Social History   Social History  . Marital status: Married    Spouse name: N/A  . Number of children: 2  . Years of education: N/A   Occupational History  . 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 Smoking status: Former Smoker    Packs/day: 1.00    Years: 10.00    Types: Cigarettes  . Smokeless tobacco: Former Systems developer     Comment: 1972  . Alcohol use 4.2 oz/week    7 Glasses of wine per week     Comment: 1 glass wine or beer daily  . Drug use: No  . Sexual activity: Not on file   Other Topics Concern  . Not on file   Social History Narrative   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  Consultant for schools.   Travels over seas     Family History  Problem Relation Age  of Onset  . Emphysema Father   . Lung cancer Father   . Hypertension Mother   . Diabetes Maternal Grandmother   . Heart disease Maternal Grandfather        Died age 109  . Liver cancer Paternal Grandmother   . Heart disease Paternal Grandfather        Died age 27  . Liver cancer Unknown   . Lung cancer Unknown   . Atrial fibrillation Son     ROS- All systems are reviewed and negative except as per the HPI above  Physical Exam: Vitals:   09/13/16 1118  BP: 124/72  Pulse: (!) 58  Weight: 218 lb 9.6 oz (99.2 kg)  Height: 5\' 10"  (1.778 m)    GEN- The patient is well appearing, alert and oriented x 3 today.   Head- normocephalic, atraumatic Eyes-  Sclera clear, conjunctiva pink Ears- hearing intact Oropharynx- clear Neck- supple, no JVP Lymph- no cervical lymphadenopathy Lungs- Clear to ausculation bilaterally, normal work of breathing Heart- Regular rate and rhythm, no murmurs, rubs or gallops, PMI not laterally displaced GI- soft, NT, ND, + BS Extremities- no clubbing, cyanosis, or edema MS-  no significant deformity or atrophy Skin- no rash or lesion Psych- euthymic mood, full affect Neuro- strength and sensation are intact  EKG- Sinus brady at 58 bpm, Pr int 186 bpm, qrs int 98 ms, qtc 400 10/16-  Left heart cath revealed non obstructive disease  Assessment and Plan: 1. Paroxysmal atrial fibrillation Currently on flecainide 75 mg/ daily cardizem and has 30 mg prn cardizem to use as well Very low afib burden If  mild shortness of breath continues to be an issue, contact office, but feel that it may be 2/2 CCB holding down HR with increase in activity.    Brandon Simmons, [facility] [address] [place], [place] [address] [phone]

## 2016-09-14 DIAGNOSIS — L821 Other seborrheic keratosis: Secondary | ICD-10-CM | POA: Diagnosis not present

## 2016-09-14 DIAGNOSIS — L820 Inflamed seborrheic keratosis: Secondary | ICD-10-CM | POA: Diagnosis not present

## 2016-09-25 DIAGNOSIS — J301 Allergic rhinitis due to pollen: Secondary | ICD-10-CM | POA: Diagnosis not present

## 2016-09-25 DIAGNOSIS — J3089 Other allergic rhinitis: Secondary | ICD-10-CM | POA: Diagnosis not present

## 2016-10-09 DIAGNOSIS — J301 Allergic rhinitis due to pollen: Secondary | ICD-10-CM | POA: Diagnosis not present

## 2016-10-09 DIAGNOSIS — J3089 Other allergic rhinitis: Secondary | ICD-10-CM | POA: Diagnosis not present

## 2016-10-11 DIAGNOSIS — J3089 Other allergic rhinitis: Secondary | ICD-10-CM | POA: Diagnosis not present

## 2016-10-11 DIAGNOSIS — J301 Allergic rhinitis due to pollen: Secondary | ICD-10-CM | POA: Diagnosis not present

## 2016-10-13 ENCOUNTER — Encounter: Payer: Self-pay | Admitting: Cardiology

## 2016-10-16 DIAGNOSIS — J301 Allergic rhinitis due to pollen: Secondary | ICD-10-CM | POA: Diagnosis not present

## 2016-10-16 DIAGNOSIS — J3089 Other allergic rhinitis: Secondary | ICD-10-CM | POA: Diagnosis not present

## 2016-10-18 DIAGNOSIS — J3089 Other allergic rhinitis: Secondary | ICD-10-CM | POA: Diagnosis not present

## 2016-10-18 DIAGNOSIS — J301 Allergic rhinitis due to pollen: Secondary | ICD-10-CM | POA: Diagnosis not present

## 2016-10-23 ENCOUNTER — Other Ambulatory Visit: Payer: Self-pay | Admitting: Pharmacist Clinician (PhC)/ Clinical Pharmacy Specialist

## 2016-10-23 MED ORDER — DILTIAZEM HCL ER COATED BEADS 120 MG PO CP24: 120.0000 mg | ORAL_CAPSULE | Freq: Every day | ORAL | 3 refills | Status: DC

## 2016-10-30 DIAGNOSIS — J301 Allergic rhinitis due to pollen: Secondary | ICD-10-CM | POA: Diagnosis not present

## 2016-10-30 DIAGNOSIS — J3089 Other allergic rhinitis: Secondary | ICD-10-CM | POA: Diagnosis not present

## 2016-11-01 DIAGNOSIS — J3089 Other allergic rhinitis: Secondary | ICD-10-CM | POA: Diagnosis not present

## 2016-11-01 DIAGNOSIS — J301 Allergic rhinitis due to pollen: Secondary | ICD-10-CM | POA: Diagnosis not present

## 2016-11-02 ENCOUNTER — Telehealth (HOSPITAL_COMMUNITY): Payer: Self-pay | Admitting: *Deleted

## 2016-11-02 NOTE — Telephone Encounter (Signed)
Pt reported another episode of afib around 4:00 that lasted until about 4:15 with low bp.  Pt took a dose of diltiazem and then his bp was low.  However, pt states he is not back in rhythm and that the current BP is 116/74.  Pt just wanted it charted but states he feels fine and will call if anything changes

## 2016-11-15 DIAGNOSIS — J3089 Other allergic rhinitis: Secondary | ICD-10-CM | POA: Diagnosis not present

## 2016-11-15 DIAGNOSIS — J301 Allergic rhinitis due to pollen: Secondary | ICD-10-CM | POA: Diagnosis not present

## 2016-11-29 DIAGNOSIS — J3089 Other allergic rhinitis: Secondary | ICD-10-CM | POA: Diagnosis not present

## 2016-11-29 DIAGNOSIS — J301 Allergic rhinitis due to pollen: Secondary | ICD-10-CM | POA: Diagnosis not present

## 2016-12-01 ENCOUNTER — Other Ambulatory Visit: Payer: Self-pay | Admitting: Internal Medicine

## 2016-12-01 DIAGNOSIS — C61 Malignant neoplasm of prostate: Secondary | ICD-10-CM | POA: Diagnosis not present

## 2016-12-01 DIAGNOSIS — M79604 Pain in right leg: Secondary | ICD-10-CM | POA: Diagnosis not present

## 2016-12-01 DIAGNOSIS — Z6831 Body mass index (BMI) 31.0-31.9, adult: Secondary | ICD-10-CM | POA: Diagnosis not present

## 2016-12-01 DIAGNOSIS — I4891 Unspecified atrial fibrillation: Secondary | ICD-10-CM | POA: Diagnosis not present

## 2016-12-04 ENCOUNTER — Other Ambulatory Visit (HOSPITAL_COMMUNITY): Payer: Self-pay | Admitting: *Deleted

## 2016-12-04 ENCOUNTER — Other Ambulatory Visit: Payer: Self-pay | Admitting: Physician Assistant

## 2016-12-04 ENCOUNTER — Other Ambulatory Visit (HOSPITAL_COMMUNITY): Payer: Self-pay | Admitting: Nurse Practitioner

## 2016-12-04 DIAGNOSIS — H52203 Unspecified astigmatism, bilateral: Secondary | ICD-10-CM | POA: Diagnosis not present

## 2016-12-04 DIAGNOSIS — I480 Paroxysmal atrial fibrillation: Secondary | ICD-10-CM

## 2016-12-04 DIAGNOSIS — Z961 Presence of intraocular lens: Secondary | ICD-10-CM | POA: Diagnosis not present

## 2016-12-04 DIAGNOSIS — H524 Presbyopia: Secondary | ICD-10-CM | POA: Diagnosis not present

## 2016-12-04 DIAGNOSIS — H26491 Other secondary cataract, right eye: Secondary | ICD-10-CM | POA: Diagnosis not present

## 2016-12-04 MED ORDER — DILTIAZEM HCL 30 MG PO TABS: ORAL_TABLET | ORAL | 2 refills | Status: DC

## 2016-12-25 ENCOUNTER — Ambulatory Visit
Admission: RE | Admit: 2016-12-25 | Discharge: 2016-12-25 | Disposition: A | Discharge status: Home / Self Care | Payer: Medicare Other | Source: Ambulatory Visit | Attending: Internal Medicine | Admitting: Internal Medicine

## 2016-12-25 DIAGNOSIS — M79604 Pain in right leg: Secondary | ICD-10-CM

## 2016-12-25 DIAGNOSIS — M48061 Spinal stenosis, lumbar region without neurogenic claudication: Secondary | ICD-10-CM | POA: Diagnosis not present

## 2016-12-25 DIAGNOSIS — J3089 Other allergic rhinitis: Secondary | ICD-10-CM | POA: Diagnosis not present

## 2016-12-25 DIAGNOSIS — J301 Allergic rhinitis due to pollen: Secondary | ICD-10-CM | POA: Diagnosis not present

## 2016-12-25 DIAGNOSIS — Z23 Encounter for immunization: Secondary | ICD-10-CM | POA: Diagnosis not present

## 2016-12-25 MED ORDER — GADOBENATE DIMEGLUMINE 529 MG/ML IV SOLN
20.0000 mL | Freq: Once | INTRAVENOUS | Status: AC | PRN
Start: 2016-12-25 — End: 2016-12-25
  Administered 2016-12-25: 20 mL via INTRAVENOUS

## 2016-12-27 ENCOUNTER — Other Ambulatory Visit: Payer: Self-pay | Admitting: Internal Medicine

## 2016-12-27 DIAGNOSIS — M545 Low back pain, unspecified: Secondary | ICD-10-CM

## 2016-12-28 ENCOUNTER — Ambulatory Visit
Admission: RE | Admit: 2016-12-28 | Discharge: 2016-12-28 | Disposition: A | Discharge status: Home / Self Care | Payer: Medicare Other | Source: Ambulatory Visit | Attending: Internal Medicine | Admitting: Internal Medicine

## 2016-12-28 DIAGNOSIS — M545 Low back pain, unspecified: Secondary | ICD-10-CM

## 2016-12-28 DIAGNOSIS — M5126 Other intervertebral disc displacement, lumbar region: Secondary | ICD-10-CM | POA: Diagnosis not present

## 2016-12-28 MED ORDER — IOPAMIDOL (ISOVUE-M 200) INJECTION 41%
1.0000 mL | Freq: Once | INTRAMUSCULAR | Status: AC
Start: 2016-12-28 — End: 2016-12-28
  Administered 2016-12-28: 1 mL via EPIDURAL

## 2016-12-28 MED ORDER — METHYLPREDNISOLONE ACETATE 40 MG/ML INJ SUSP (RADIOLOG
120.0000 mg | Freq: Once | INTRAMUSCULAR | Status: AC
  Administered 2016-12-28: 120 mg via EPIDURAL

## 2016-12-28 NOTE — Discharge Instructions (Signed)

## 2017-01-01 ENCOUNTER — Telehealth: Payer: Self-pay | Admitting: Radiology

## 2017-01-01 NOTE — Telephone Encounter (Signed)
Pain level 10 plus is now 0. Injection was last Thursday and Dr. Jobe Igo was the physician who gave it. Pt wanted doctor to be aware.

## 2017-01-08 DIAGNOSIS — J301 Allergic rhinitis due to pollen: Secondary | ICD-10-CM | POA: Diagnosis not present

## 2017-01-08 DIAGNOSIS — J3089 Other allergic rhinitis: Secondary | ICD-10-CM | POA: Diagnosis not present

## 2017-01-22 DIAGNOSIS — J3089 Other allergic rhinitis: Secondary | ICD-10-CM | POA: Diagnosis not present

## 2017-01-22 DIAGNOSIS — J301 Allergic rhinitis due to pollen: Secondary | ICD-10-CM | POA: Diagnosis not present

## 2017-02-05 DIAGNOSIS — J301 Allergic rhinitis due to pollen: Secondary | ICD-10-CM | POA: Diagnosis not present

## 2017-02-05 DIAGNOSIS — J3089 Other allergic rhinitis: Secondary | ICD-10-CM | POA: Diagnosis not present

## 2017-02-07 ENCOUNTER — Other Ambulatory Visit: Payer: Self-pay | Admitting: Internal Medicine

## 2017-02-07 DIAGNOSIS — M545 Low back pain: Principal | ICD-10-CM

## 2017-02-07 DIAGNOSIS — G8929 Other chronic pain: Secondary | ICD-10-CM

## 2017-02-14 ENCOUNTER — Ambulatory Visit
Admission: RE | Admit: 2017-02-14 | Discharge: 2017-02-14 | Disposition: A | Discharge status: Home / Self Care | Payer: Medicare Other | Source: Ambulatory Visit | Attending: Internal Medicine | Admitting: Internal Medicine

## 2017-02-14 DIAGNOSIS — M48061 Spinal stenosis, lumbar region without neurogenic claudication: Secondary | ICD-10-CM | POA: Diagnosis not present

## 2017-02-14 DIAGNOSIS — M545 Low back pain: Principal | ICD-10-CM

## 2017-02-14 DIAGNOSIS — G8929 Other chronic pain: Secondary | ICD-10-CM

## 2017-02-14 MED ORDER — IOPAMIDOL (ISOVUE-M 200) INJECTION 41%
1.0000 mL | Freq: Once | INTRAMUSCULAR | Status: AC
  Administered 2017-02-14: 1 mL via EPIDURAL

## 2017-02-14 MED ORDER — METHYLPREDNISOLONE ACETATE 40 MG/ML INJ SUSP (RADIOLOG
120.0000 mg | Freq: Once | INTRAMUSCULAR | Status: AC
  Administered 2017-02-14: 120 mg via EPIDURAL

## 2017-02-14 NOTE — Discharge Instructions (Signed)

## 2017-02-21 DIAGNOSIS — J301 Allergic rhinitis due to pollen: Secondary | ICD-10-CM | POA: Diagnosis not present

## 2017-02-21 DIAGNOSIS — J3089 Other allergic rhinitis: Secondary | ICD-10-CM | POA: Diagnosis not present

## 2017-02-28 ENCOUNTER — Ambulatory Visit (HOSPITAL_COMMUNITY): Payer: Self-pay | Admitting: Nurse Practitioner

## 2017-03-05 DIAGNOSIS — M545 Low back pain: Secondary | ICD-10-CM | POA: Diagnosis not present

## 2017-03-05 DIAGNOSIS — M25512 Pain in left shoulder: Secondary | ICD-10-CM | POA: Diagnosis not present

## 2017-03-05 DIAGNOSIS — M5416 Radiculopathy, lumbar region: Secondary | ICD-10-CM | POA: Diagnosis not present

## 2017-03-07 ENCOUNTER — Ambulatory Visit (HOSPITAL_COMMUNITY)
Admission: RE | Admit: 2017-03-07 | Discharge: 2017-03-07 | Disposition: A | Discharge status: Home / Self Care | Payer: Medicare Other | Source: Ambulatory Visit | Attending: Nurse Practitioner | Admitting: Nurse Practitioner

## 2017-03-07 ENCOUNTER — Encounter (HOSPITAL_COMMUNITY): Payer: Self-pay | Admitting: Nurse Practitioner

## 2017-03-07 VITALS — BP 122/72 | HR 57 | Ht 70.0 in | Wt 213.6 lb

## 2017-03-07 DIAGNOSIS — J45909 Unspecified asthma, uncomplicated: Secondary | ICD-10-CM | POA: Insufficient documentation

## 2017-03-07 DIAGNOSIS — Z8546 Personal history of malignant neoplasm of prostate: Secondary | ICD-10-CM | POA: Insufficient documentation

## 2017-03-07 DIAGNOSIS — K5790 Diverticulosis of intestine, part unspecified, without perforation or abscess without bleeding: Secondary | ICD-10-CM | POA: Diagnosis not present

## 2017-03-07 DIAGNOSIS — I480 Paroxysmal atrial fibrillation: Secondary | ICD-10-CM | POA: Diagnosis not present

## 2017-03-07 DIAGNOSIS — E785 Hyperlipidemia, unspecified: Secondary | ICD-10-CM | POA: Diagnosis not present

## 2017-03-07 DIAGNOSIS — Z7982 Long term (current) use of aspirin: Secondary | ICD-10-CM | POA: Insufficient documentation

## 2017-03-07 DIAGNOSIS — M1990 Unspecified osteoarthritis, unspecified site: Secondary | ICD-10-CM | POA: Diagnosis not present

## 2017-03-07 DIAGNOSIS — Z87891 Personal history of nicotine dependence: Secondary | ICD-10-CM | POA: Diagnosis not present

## 2017-03-07 DIAGNOSIS — K219 Gastro-esophageal reflux disease without esophagitis: Secondary | ICD-10-CM | POA: Diagnosis not present

## 2017-03-07 DIAGNOSIS — Z79899 Other long term (current) drug therapy: Secondary | ICD-10-CM | POA: Insufficient documentation

## 2017-03-07 DIAGNOSIS — N529 Male erectile dysfunction, unspecified: Secondary | ICD-10-CM | POA: Diagnosis not present

## 2017-03-07 NOTE — Progress Notes (Signed)
Patient ID: Brandon Simmons, male   DOB: 19-Sep-1945, 71 y.o.   MRN: [id number]     Primary Care Physician: Leanna Battles, MD Cardiologist: Dr.Hochrein    Brandon Simmons is a 71 y.o. male with a h/o prostrate CA, HL, PAF that was first diagnosed while he was in [place] in fall of 2016. He woke up with heart racing, dyspnea and presented to [facility] ER. He had an echo which showed normal function and reported cardiac catherization without any significant disease. He was placed on toprol from which he  had significant side effects. He was seen by Richardson Dopp and BB was stopped, replaced by cardizem and flecaininde 50 mg bid was started. Of note, his son and brother both have afib.He drank 2 glasses of wine a night, no tobacco, no high amounts caffeine, tries to exercise on a regular basis. Is pending a sleep study,some snoring reported , no apnea.  He did well until 02/24/15, when he had breakthrough afib in the evening with v rate of 124. He presented to [facility] ER and received IV dilt with return of SR. He has since been given 30 mg dilt to use as pill in pocket. No further afib. We further discussed management of breakthrough Afib. Pt was under the impression that he had to go to the ER with every episode of afib. We discussed that it was not life threatening and when it would be appropriate to go to ER. We discussed triggers for afib.  He does describe a cough, intermittent "tickle in throat", that he thinks might be from cardizem or flecainide which I think would be unlikely. He is not on an ACE but of note, he is on injectable prostaglandin powder as needed for  ED, dose increased a few months ago, ? if contributing. He does travel to [place] several times a year and is very concerned re afib episodes with these trips. He also has an older cousin who had an afib ablation and died shortly after the procedure.   Pt returns to [facility], 07/23/15 to discuss episode of afib when on  business trip to [place]  last week. 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 He sat down and rested and there was a school nurse there who recommended that he go to a private hospital and be evaluated. He took his prn cardizem before leaving for the ER. He was seen int the  ER by the chief cardiologist and by that time he had returned to [place]. He reports that the full cost of treatment in ER was $94.00! He has not had any further issues with afib since return but tells me that he has had a breakthrough episode a month ago, for around 2 hours. Continues on flecainide 50 mg bid and asa . Chadsvasc score is one for age, just on ASA.  Returns 7/25 and reports no episodes of afib. He does plan to travel extensively over the next few months and we discussed plan if he should develop afib. Chadsvasc score remains 1, on asa.  F/u 10/19 in the afib clinic, he is doing well. He has traveled extensively in [place] over the last few months and has done well without any afib. He has noticed some mild ankle edema. He continues on flecainide. Chadsvasc score of 1 for age.  F/u in afib clinic, 09/13/16, he feels well. No afib in some time. Notices some shortness of breath walking up inclines. We discussed that this may be from CCB holding down HR with activity. He had a cardiac cath in 2016 free of any CAD.  F/u in afib clinic 03/08/17, He is in NSR and feels well. He had his last afib episode in September while on Prednisone. Took 30 mg Cardizem and it resolved within the hour.He is happy with control of afib with flecainide. He continues on ASA with a chadsvasc score of one for age.  Today, he denies symptoms of palpitations, chest pain, shortness of breath, orthopnea, PND, lower extremity edema, dizziness, presyncope, syncope, or neurologic sequela. The patient is  tolerating medications without difficulties and is otherwise without complaint today.   Past Medical History:  Diagnosis Date  . Arthritis   . Asthma   . Diverticulitis   . Diverticulosis   . GERD (gastroesophageal reflux disease)   . Hemorrhoids   . History of echocardiogram    a. Echo 10/16 [facility]):  EF 55%, trace MR, mild TR, mild to mod AI, mild dilated Ao root (41 mm)  . Hyperlipemia   . Organic erectile dysfunction   . PAF (paroxysmal atrial fibrillation) ([place])    a. admx to [facility] in [place], [place] 10/16 with AF with RVR >> converted to NSR with IV Dilt;  b. Flecainide started >> ETT neg for pro-arrhythmia   . Prostate cancer ([place]) 04/25/2006   Gleason 3+4=7  . Prostatitis   . S/P cardiac cath    a. LHC at [facility] 10/16:  LM ok, mLAD 30%, LCx ok, dRCA 20%  . S/P radiation therapy 01/19/2014 through 03/05/2014                                                      Prostate bed 6600 cGy in 33 sessions                           Past Surgical History:  Procedure Laterality Date  . APPENDECTOMY     1967  . cataract surgery Left   . COLONOSCOPY    . KNEE ARTHROSCOPY     R  . LARYNX SURGERY     vocal cord lesion- benign  . POLYPECTOMY    . PROSTATECTOMY  04/25/2006    Current Outpatient Medications  Medication Sig Dispense Refill  . albuterol (PROVENTIL HFA;VENTOLIN HFA) 108 (90 BASE) MCG/ACT inhaler Inhale 2 puffs into the lungs every 6 (six) hours as needed for wheezing or shortness of breath.     . Alprostadil (PROSTAGLANDIN E1) POWD 1 mL by Does not apply route as needed (FOR ERECTILE DYSFUNCTION).     Marland Kitchen aspirin 81 MG tablet Take 81 mg by mouth daily.    Marland Kitchen atorvastatin (LIPITOR) 40 MG tablet Take 20 mg by mouth daily.     . cholecalciferol (VITAMIN D) 1000 UNITS tablet Take 1,000 Units by mouth daily.    Marland Kitchen diltiazem (CARDIZEM CD) 120 MG 24 hr capsule Take 1 capsule (120 mg total) by mouth daily. 90 capsule 3  . diltiazem (CARDIZEM) 30 MG tablet  Take 1 tablet every 4 hours AS NEEDED for afib rapid heart rate over 100 45 tablet 2  .  flecainide (TAMBOCOR) 150 MG tablet TAKE 1/2 TABLET BY MOUTH TWICE DAILY 90 tablet 2  . fluticasone (FLONASE) 50 MCG/ACT nasal spray Place 1 spray into the nose as needed.    . Glucosamine-Chondroit-Vit C-Mn (GLUCOSAMINE 1500 COMPLEX) CAPS Take 1 capsule by mouth daily.    Marland Kitchen HYDROcodone-acetaminophen (NORCO) 5-325 MG per tablet Take 1 tablet by mouth every 6 (six) hours as needed for severe pain. 2.5-162.5 as needed    . L-Lysine 500 MG TABS Take 1 tablet by mouth daily.    . Mometasone Furoate (ASMANEX 30 METERED DOSES) 110 MCG/INH AEPB Inhale 1 puff into the lungs every morning. (Patient taking differently: Inhale 1 puff into the lungs at bedtime. )    . montelukast (SINGULAIR) 10 MG tablet Take 10 mg by mouth at bedtime.    . Multiple Vitamin (MULTIVITAMIN) capsule Take 1 capsule by mouth daily.    Marland Kitchen omeprazole (PRILOSEC) 20 MG capsule Take 20 mg by mouth 2 times daily at 12 noon and 4 pm.     . predniSONE (DELTASONE) 10 MG tablet Take 10 mg by mouth as needed (FOR ASTHMA).     . Probiotic Product (ALIGN) 4 MG CAPS Take by mouth 1 day or 1 dose.    . psyllium (REGULOID) 0.52 G capsule Take 0.52 g by mouth 2 (two) times daily.    Marland Kitchen zolpidem (AMBIEN) 10 MG tablet Take 10 mg by mouth at bedtime as needed for sleep.    Marland Kitchen acetaminophen (TYLENOL) 500 MG tablet Take 500 mg by mouth every 6 (six) hours as needed for mild pain.     No current facility-administered medications for this encounter.     No Known Allergies  Social History   Socioeconomic History  . Marital status: Married    Spouse name: Not on file  . Number of children: 2  . Years of education: Not on file  . Highest education level: Not on file  Social Needs  . Financial resource strain: Not on file  . Food insecurity - worry: Not on file  . Food insecurity - inability: Not on file  . Transportation needs - medical: Not on file  .  Transportation needs - non-medical: Not on file  Occupational History  . 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 Occupation: Optometrist  Tobacco Use  . Smoking status: Former Smoker    Packs/day: 1.00    Years: 10.00    Pack years: 10.00    Types: Cigarettes  . Smokeless tobacco: Former Systems developer  . Tobacco comment: 1972  Substance and Sexual Activity  . Alcohol use: Yes    Alcohol/week: 4.2 oz    Types: 7 Glasses of wine per week    Comment: 1 glass wine or beer daily  . Drug use: No  . Sexual activity: Not on file  Other Topics Concern  . Not on file  Social History Narrative   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  Consultant for schools.   Travels over seas     Family History  Problem Relation Age of Onset  . Emphysema Father   . Lung cancer Father   . Hypertension Mother   . Diabetes Maternal Grandmother   . Heart disease Maternal Grandfather        Died age 81  . Liver cancer Paternal Grandmother   . Heart disease Paternal Grandfather        Died age 108  . Liver cancer Unknown   . Lung cancer Unknown   . Atrial fibrillation  Son     ROS- All systems are reviewed and negative except as per the HPI above  Physical Exam: Vitals:   03/07/17 1113  BP: 122/72  Pulse: (!) 57  Weight: 213 lb 9.6 oz (96.9 kg)  Height: 5\' 10"  (1.778 m)    GEN- The patient is well appearing, alert and oriented x 3 today.   Head- normocephalic, atraumatic Eyes-  Sclera clear, conjunctiva pink Ears- hearing intact Oropharynx- clear Neck- supple, no JVP Lymph- no cervical lymphadenopathy Lungs- Clear to ausculation bilaterally, normal work of breathing Heart- Regular rate and rhythm, no murmurs, rubs or gallops, PMI not laterally displaced GI- soft, NT, ND, + BS Extremities- no clubbing, cyanosis, or edema MS- no significant deformity or atrophy Skin- no rash or lesion Psych- euthymic mood, full affect Neuro- strength and sensation are intact  EKG- Sinus brady at 57 bpm, Pr int 180 bpm, qrs int 100  ms, qtc 393 10/16-  Left heart cath revealed non obstructive disease  Assessment and Plan: 1. Paroxysmal atrial fibrillation Currently on flecainide 75 mg/ daily cardizem and has 30 mg prn cardizem to use as well Very low afib burden Continue on ASA for a chadsvasc score of 1(age)  F/u with Dr. Percival Spanish in April as scheduled Afib clinic in 6 months or as needed    Banks. Carroll, [facility] [address] [place], [place] [address] [phone]

## 2017-03-08 DIAGNOSIS — J3089 Other allergic rhinitis: Secondary | ICD-10-CM | POA: Diagnosis not present

## 2017-03-08 DIAGNOSIS — J301 Allergic rhinitis due to pollen: Secondary | ICD-10-CM | POA: Diagnosis not present

## 2017-03-09 DIAGNOSIS — M545 Low back pain: Secondary | ICD-10-CM | POA: Diagnosis not present

## 2017-03-09 DIAGNOSIS — M25512 Pain in left shoulder: Secondary | ICD-10-CM | POA: Diagnosis not present

## 2017-03-09 DIAGNOSIS — M5416 Radiculopathy, lumbar region: Secondary | ICD-10-CM | POA: Diagnosis not present

## 2017-03-14 DIAGNOSIS — M25512 Pain in left shoulder: Secondary | ICD-10-CM | POA: Diagnosis not present

## 2017-03-14 DIAGNOSIS — M5416 Radiculopathy, lumbar region: Secondary | ICD-10-CM | POA: Diagnosis not present

## 2017-03-14 DIAGNOSIS — M545 Low back pain: Secondary | ICD-10-CM | POA: Diagnosis not present

## 2017-03-19 ENCOUNTER — Other Ambulatory Visit: Payer: Self-pay | Admitting: Internal Medicine

## 2017-03-19 DIAGNOSIS — M545 Low back pain: Principal | ICD-10-CM

## 2017-03-19 DIAGNOSIS — M48061 Spinal stenosis, lumbar region without neurogenic claudication: Secondary | ICD-10-CM | POA: Diagnosis not present

## 2017-03-19 DIAGNOSIS — G8929 Other chronic pain: Secondary | ICD-10-CM

## 2017-03-20 DIAGNOSIS — I82409 Acute embolism and thrombosis of unspecified deep veins of unspecified lower extremity: Secondary | ICD-10-CM

## 2017-03-20 HISTORY — DX: Acute embolism and thrombosis of unspecified deep veins of unspecified lower extremity: I82.409

## 2017-03-20 HISTORY — PX: BACK SURGERY: SHX140

## 2017-03-22 DIAGNOSIS — J3089 Other allergic rhinitis: Secondary | ICD-10-CM | POA: Diagnosis not present

## 2017-03-22 DIAGNOSIS — J301 Allergic rhinitis due to pollen: Secondary | ICD-10-CM | POA: Diagnosis not present

## 2017-03-22 DIAGNOSIS — C61 Malignant neoplasm of prostate: Secondary | ICD-10-CM | POA: Diagnosis not present

## 2017-03-28 DIAGNOSIS — N5201 Erectile dysfunction due to arterial insufficiency: Secondary | ICD-10-CM | POA: Diagnosis not present

## 2017-03-28 DIAGNOSIS — C61 Malignant neoplasm of prostate: Secondary | ICD-10-CM | POA: Diagnosis not present

## 2017-03-29 ENCOUNTER — Ambulatory Visit
Admission: RE | Admit: 2017-03-29 | Discharge: 2017-03-29 | Disposition: A | Discharge status: Home / Self Care | Payer: Medicare Other | Source: Ambulatory Visit | Attending: Internal Medicine | Admitting: Internal Medicine

## 2017-03-29 DIAGNOSIS — G8929 Other chronic pain: Secondary | ICD-10-CM

## 2017-03-29 DIAGNOSIS — M545 Low back pain: Principal | ICD-10-CM

## 2017-03-29 MED ORDER — METHYLPREDNISOLONE ACETATE 40 MG/ML INJ SUSP (RADIOLOG
120.0000 mg | Freq: Once | INTRAMUSCULAR | Status: AC
  Administered 2017-03-29: 120 mg via EPIDURAL

## 2017-03-29 MED ORDER — IOPAMIDOL (ISOVUE-M 200) INJECTION 41%
1.0000 mL | Freq: Once | INTRAMUSCULAR | Status: AC
  Administered 2017-03-29: 1 mL via EPIDURAL

## 2017-03-29 NOTE — Discharge Instructions (Signed)

## 2017-04-05 DIAGNOSIS — J301 Allergic rhinitis due to pollen: Secondary | ICD-10-CM | POA: Diagnosis not present

## 2017-04-05 DIAGNOSIS — J3089 Other allergic rhinitis: Secondary | ICD-10-CM | POA: Diagnosis not present

## 2017-04-16 DIAGNOSIS — J3089 Other allergic rhinitis: Secondary | ICD-10-CM | POA: Diagnosis not present

## 2017-04-16 DIAGNOSIS — J301 Allergic rhinitis due to pollen: Secondary | ICD-10-CM | POA: Diagnosis not present

## 2017-04-16 DIAGNOSIS — J4530 Mild persistent asthma, uncomplicated: Secondary | ICD-10-CM | POA: Diagnosis not present

## 2017-05-03 DIAGNOSIS — J301 Allergic rhinitis due to pollen: Secondary | ICD-10-CM | POA: Diagnosis not present

## 2017-05-03 DIAGNOSIS — J3089 Other allergic rhinitis: Secondary | ICD-10-CM | POA: Diagnosis not present

## 2017-05-07 DIAGNOSIS — M545 Low back pain: Secondary | ICD-10-CM | POA: Diagnosis not present

## 2017-05-07 DIAGNOSIS — M5416 Radiculopathy, lumbar region: Secondary | ICD-10-CM | POA: Diagnosis not present

## 2017-05-07 DIAGNOSIS — M48061 Spinal stenosis, lumbar region without neurogenic claudication: Secondary | ICD-10-CM | POA: Diagnosis not present

## 2017-05-07 DIAGNOSIS — M7138 Other bursal cyst, other site: Secondary | ICD-10-CM | POA: Diagnosis not present

## 2017-05-07 DIAGNOSIS — R030 Elevated blood-pressure reading, without diagnosis of hypertension: Secondary | ICD-10-CM | POA: Diagnosis not present

## 2017-05-07 DIAGNOSIS — Z6831 Body mass index (BMI) 31.0-31.9, adult: Secondary | ICD-10-CM | POA: Diagnosis not present

## 2017-05-09 DIAGNOSIS — R05 Cough: Secondary | ICD-10-CM | POA: Diagnosis not present

## 2017-05-09 DIAGNOSIS — R509 Fever, unspecified: Secondary | ICD-10-CM | POA: Diagnosis not present

## 2017-05-10 DIAGNOSIS — Z6831 Body mass index (BMI) 31.0-31.9, adult: Secondary | ICD-10-CM | POA: Diagnosis not present

## 2017-05-10 DIAGNOSIS — I4891 Unspecified atrial fibrillation: Secondary | ICD-10-CM | POA: Diagnosis not present

## 2017-05-10 DIAGNOSIS — M545 Low back pain: Secondary | ICD-10-CM | POA: Diagnosis not present

## 2017-05-10 DIAGNOSIS — R05 Cough: Secondary | ICD-10-CM | POA: Diagnosis not present

## 2017-05-10 DIAGNOSIS — J45998 Other asthma: Secondary | ICD-10-CM | POA: Diagnosis not present

## 2017-05-10 DIAGNOSIS — J302 Other seasonal allergic rhinitis: Secondary | ICD-10-CM | POA: Diagnosis not present

## 2017-05-14 DIAGNOSIS — N182 Chronic kidney disease, stage 2 (mild): Secondary | ICD-10-CM | POA: Diagnosis not present

## 2017-05-14 DIAGNOSIS — E7849 Other hyperlipidemia: Secondary | ICD-10-CM | POA: Diagnosis not present

## 2017-05-14 DIAGNOSIS — R82998 Other abnormal findings in urine: Secondary | ICD-10-CM | POA: Diagnosis not present

## 2017-05-21 DIAGNOSIS — J301 Allergic rhinitis due to pollen: Secondary | ICD-10-CM | POA: Diagnosis not present

## 2017-05-21 DIAGNOSIS — Z1389 Encounter for screening for other disorder: Secondary | ICD-10-CM | POA: Diagnosis not present

## 2017-05-21 DIAGNOSIS — R05 Cough: Secondary | ICD-10-CM | POA: Diagnosis not present

## 2017-05-21 DIAGNOSIS — C61 Malignant neoplasm of prostate: Secondary | ICD-10-CM | POA: Diagnosis not present

## 2017-05-21 DIAGNOSIS — I2510 Atherosclerotic heart disease of native coronary artery without angina pectoris: Secondary | ICD-10-CM | POA: Diagnosis not present

## 2017-05-21 DIAGNOSIS — J45998 Other asthma: Secondary | ICD-10-CM | POA: Diagnosis not present

## 2017-05-21 DIAGNOSIS — Z0000 Encounter for general adult medical examination without abnormal findings: Secondary | ICD-10-CM | POA: Diagnosis not present

## 2017-05-21 DIAGNOSIS — M79604 Pain in right leg: Secondary | ICD-10-CM | POA: Diagnosis not present

## 2017-05-21 DIAGNOSIS — I4891 Unspecified atrial fibrillation: Secondary | ICD-10-CM | POA: Diagnosis not present

## 2017-05-21 DIAGNOSIS — J3089 Other allergic rhinitis: Secondary | ICD-10-CM | POA: Diagnosis not present

## 2017-05-21 DIAGNOSIS — K219 Gastro-esophageal reflux disease without esophagitis: Secondary | ICD-10-CM | POA: Diagnosis not present

## 2017-05-21 DIAGNOSIS — Z6831 Body mass index (BMI) 31.0-31.9, adult: Secondary | ICD-10-CM | POA: Diagnosis not present

## 2017-05-21 DIAGNOSIS — E7849 Other hyperlipidemia: Secondary | ICD-10-CM | POA: Diagnosis not present

## 2017-05-21 DIAGNOSIS — M545 Low back pain: Secondary | ICD-10-CM | POA: Diagnosis not present

## 2017-05-23 DIAGNOSIS — Z1212 Encounter for screening for malignant neoplasm of rectum: Secondary | ICD-10-CM | POA: Diagnosis not present

## 2017-05-24 DIAGNOSIS — J301 Allergic rhinitis due to pollen: Secondary | ICD-10-CM | POA: Diagnosis not present

## 2017-05-24 DIAGNOSIS — J3089 Other allergic rhinitis: Secondary | ICD-10-CM | POA: Diagnosis not present

## 2017-05-28 ENCOUNTER — Other Ambulatory Visit (HOSPITAL_COMMUNITY): Payer: Self-pay | Admitting: *Deleted

## 2017-05-28 DIAGNOSIS — I480 Paroxysmal atrial fibrillation: Secondary | ICD-10-CM

## 2017-05-28 MED ORDER — FLECAINIDE ACETATE 150 MG PO TABS: 75.0000 mg | ORAL_TABLET | Freq: Two times a day (BID) | ORAL | 2 refills | Status: DC

## 2017-05-29 ENCOUNTER — Encounter (HOSPITAL_COMMUNITY): Payer: Self-pay

## 2017-05-29 ENCOUNTER — Emergency Department (HOSPITAL_COMMUNITY): Payer: Medicare Other

## 2017-05-29 ENCOUNTER — Emergency Department (HOSPITAL_COMMUNITY)
Admission: EM | Admit: 2017-05-29 | Discharge: 2017-05-29 | Disposition: A | Discharge status: Home / Self Care | Payer: Medicare Other | Attending: Emergency Medicine | Admitting: Emergency Medicine

## 2017-05-29 DIAGNOSIS — Z8546 Personal history of malignant neoplasm of prostate: Secondary | ICD-10-CM | POA: Insufficient documentation

## 2017-05-29 DIAGNOSIS — Z87891 Personal history of nicotine dependence: Secondary | ICD-10-CM | POA: Diagnosis not present

## 2017-05-29 DIAGNOSIS — Z79899 Other long term (current) drug therapy: Secondary | ICD-10-CM | POA: Diagnosis not present

## 2017-05-29 DIAGNOSIS — J45901 Unspecified asthma with (acute) exacerbation: Secondary | ICD-10-CM | POA: Insufficient documentation

## 2017-05-29 DIAGNOSIS — Z7982 Long term (current) use of aspirin: Secondary | ICD-10-CM | POA: Insufficient documentation

## 2017-05-29 DIAGNOSIS — R05 Cough: Secondary | ICD-10-CM | POA: Diagnosis not present

## 2017-05-29 MED ORDER — ALBUTEROL SULFATE (2.5 MG/3ML) 0.083% IN NEBU
5.0000 mg | INHALATION_SOLUTION | Freq: Once | RESPIRATORY_TRACT | Status: AC
  Administered 2017-05-29: 5 mg via RESPIRATORY_TRACT
  Filled 2017-05-29: qty 6

## 2017-05-29 MED ORDER — PREDNISONE 10 MG PO TABS: ORAL_TABLET | ORAL | 0 refills | Status: DC

## 2017-05-29 MED ORDER — DEXAMETHASONE SODIUM PHOSPHATE 10 MG/ML IJ SOLN
10.0000 mg | Freq: Once | INTRAMUSCULAR | Status: AC
  Administered 2017-05-29: 10 mg via INTRAMUSCULAR
  Filled 2017-05-29: qty 1

## 2017-05-29 NOTE — ED Triage Notes (Signed)
Pt presents with c/o asthma exacerbation. Pt reports he was working all day and was fighting off the symptoms but they progressed through the night. Pt reports he has used his rescue inhaler approx 3 times in the last 24 hours with no relief.

## 2017-05-29 NOTE — ED Provider Notes (Signed)
[name] DEPT Provider Note   CSN: [id number] Arrival date & time: 05/29/17  0346     History   Chief Complaint Chief Complaint  Patient presents with  . Asthma    HPI Brandon Simmons is a 72 y.o. male.  The history is provided by the patient. No language interpreter was used.  Asthma  This is a new problem. The current episode started yesterday. The problem occurs constantly. The problem has not changed since onset.Pertinent negatives include no chest pain. Nothing aggravates the symptoms. Nothing relieves the symptoms. He has tried nothing for the symptoms. The treatment provided no relief.  Pt complains of asthma attack.  Pt reports he has been using his inhaler without relief.  Past Medical History:  Diagnosis Date  . Arthritis   . Asthma   . Diverticulitis   . Diverticulosis   . GERD (gastroesophageal reflux disease)   . Hemorrhoids   . History of echocardiogram    a. Echo 10/16 [facility]):  EF 55%, trace MR, mild TR, mild to mod AI, mild dilated Ao root (41 mm)  . Hyperlipemia   . Organic erectile dysfunction   . PAF (paroxysmal atrial fibrillation) ([place])    a. admx to [facility] in [place], [place] 10/16 with AF with RVR >> converted to NSR with IV Dilt;  b. Flecainide started >> ETT neg for pro-arrhythmia   . Prostate cancer ([place]) 04/25/2006   Gleason 3+4=7  . Prostatitis   . S/P cardiac cath    a. LHC at [facility] 10/16:  LM ok, mLAD 30%, LCx ok, dRCA 20%  . S/P radiation therapy 01/19/2014 through 03/05/2014                                                      Prostate bed 6600 cGy in 33 sessions                            Patient Active Problem List   Diagnosis Date Noted  . PAF (paroxysmal atrial fibrillation) ([place]) 02/26/2015  . Malignant neoplasm of prostate ([place]) 10/16/2013    Past Surgical History:  Procedure Laterality Date  . APPENDECTOMY     1967  . cataract surgery Left   . COLONOSCOPY      . KNEE ARTHROSCOPY     R  . LARYNX SURGERY     vocal cord lesion- benign  . POLYPECTOMY    . PROSTATECTOMY  04/25/2006       Home Medications    Prior to Admission medications   Medication Sig Start Date End Date Taking? Authorizing Provider  acetaminophen (TYLENOL) 500 MG tablet Take 500 mg by mouth every 6 (six) hours as needed for mild pain.    [provider]  albuterol (PROVENTIL HFA;VENTOLIN HFA) 108 (90 BASE) MCG/ACT inhaler Inhale 2 puffs into the lungs every 6 (six) hours as needed for wheezing or shortness of breath.     [provider]  Alprostadil (PROSTAGLANDIN E1) POWD 1 mL by Does not apply route as needed (FOR ERECTILE DYSFUNCTION).     [provider]  aspirin 81 MG tablet Take 81 mg by mouth daily.    [provider]  atorvastatin (LIPITOR) 40 MG tablet Take 20 mg  by mouth daily.     [provider]  cholecalciferol (VITAMIN D) 1000 UNITS tablet Take 1,000 Units by mouth daily.    [provider]  diltiazem (CARDIZEM CD) 120 MG 24 hr capsule Take 1 capsule (120 mg total) by mouth daily. 10/23/16 03/07/17  Minus Breeding, MD  diltiazem (CARDIZEM) 30 MG tablet Take 1 tablet every 4 hours AS NEEDED for afib rapid heart rate over 100 12/04/16   Sherran Needs, NP  flecainide (TAMBOCOR) 150 MG tablet Take 0.5 tablets (75 mg total) by mouth 2 (two) times daily. 05/28/17   Sherran Needs, NP  fluticasone (FLONASE) 50 MCG/ACT nasal spray Place 1 spray into the nose as needed. 09/04/13   [provider]  Glucosamine-Chondroit-Vit C-Mn (GLUCOSAMINE 1500 COMPLEX) CAPS Take 1 capsule by mouth daily.    [provider]  HYDROcodone-acetaminophen (NORCO) 5-325 MG per tablet Take 1 tablet by mouth every 6 (six) hours as needed for severe pain. 2.5-162.5 as needed    [provider]  L-Lysine 500 MG TABS Take 1 tablet by mouth daily.    [provider]  Mometasone Furoate (ASMANEX 30 METERED DOSES)  110 MCG/INH AEPB Inhale 1 puff into the lungs every morning. Patient taking differently: Inhale 1 puff into the lungs at bedtime.  10/12/15   Sherran Needs, NP  montelukast (SINGULAIR) 10 MG tablet Take 10 mg by mouth at bedtime.    [provider]  Multiple Vitamin (MULTIVITAMIN) capsule Take 1 capsule by mouth daily.    [provider]  omeprazole (PRILOSEC) 20 MG capsule Take 20 mg by mouth 2 times daily at 12 noon and 4 pm.     [provider]  predniSONE (DELTASONE) 10 MG tablet Take 10 mg by mouth as needed (FOR ASTHMA).     [provider]  Probiotic Product (ALIGN) 4 MG CAPS Take by mouth 1 day or 1 dose.    [provider]  psyllium (REGULOID) 0.52 G capsule Take 0.52 g by mouth 2 (two) times daily.    [provider]  zolpidem (AMBIEN) 10 MG tablet Take 10 mg by mouth at bedtime as needed for sleep.    [provider]    Family History Family History  Problem Relation Age of Onset  . Emphysema Father   . Lung cancer Father   . Hypertension Mother   . Diabetes Maternal Grandmother   . Heart disease Maternal Grandfather        Died age 41  . Liver cancer Paternal Grandmother   . Heart disease Paternal Grandfather        Died age 22  . Liver cancer Unknown   . Lung cancer Unknown   . Atrial fibrillation Son     Social History Social History   Tobacco Use  . Smoking status: Former Smoker    Packs/day: 1.00    Years: 10.00    Pack years: 10.00    Types: Cigarettes  . Smokeless tobacco: Former Systems developer  . Tobacco comment: 1972  Substance Use Topics  . Alcohol use: Yes    Alcohol/week: 4.2 oz    Types: 7 Glasses of wine per week    Comment: 1 glass wine or beer daily  . Drug use: No     Allergies   Patient has no known allergies.   Review of Systems Review of Systems  Cardiovascular: Negative for chest pain.  All other systems reviewed and are negative.    Physical  Exam Updated Vital Signs BP  136/73 (BP Location: Left Arm)   Pulse 77   Temp 98.8 F (37.1 C) (Oral)   Resp 15   SpO2 98%   Physical Exam  Constitutional: He appears well-developed and well-nourished.  HENT:  Head: Normocephalic and atraumatic.  Right Ear: External ear normal.  Left Ear: External ear normal.  Eyes: Conjunctivae are normal.  Neck: Neck supple.  Cardiovascular: Normal rate and regular rhythm.  No murmur heard. Pulmonary/Chest: Effort normal. No respiratory distress. He has wheezes.  Faint wheezing   Abdominal: Soft. There is no tenderness.  Musculoskeletal: He exhibits no edema.  Neurological: He is alert.  Skin: Skin is warm and dry.  Psychiatric: He has a normal mood and affect.  Nursing note and vitals reviewed.    ED Treatments / Results  Labs (all labs ordered are listed, but only abnormal results are displayed) Labs Reviewed - No data to display  EKG  EKG Interpretation None       Radiology Dg Chest 2 View  Result Date: 05/29/2017 CLINICAL DATA:  72 y/o M; asthma, cough, wheezing, shortness of breath. EXAM: CHEST - 2 VIEW COMPARISON:  08/24/2015 chest radiograph FINDINGS: Stable heart size and mediastinal contours are within normal limits. Both lungs are clear. The visualized skeletal structures are unremarkable. IMPRESSION: No acute pulmonary process identified. Electronically Signed   By: Kristine Garbe M.D.   On: 05/29/2017 04:27    Procedures Procedures (including critical care time)  Medications Ordered in ED Medications  albuterol (PROVENTIL) (2.5 MG/3ML) 0.083% nebulizer solution 5 mg (5 mg Nebulization Given 05/29/17 0408)  albuterol (PROVENTIL) (2.5 MG/3ML) 0.083% nebulizer solution 5 mg (5 mg Nebulization Given 05/29/17 0747)  dexamethasone (DECADRON) injection 10 mg (10 mg Intramuscular Given 05/29/17 0747)     Initial Impression / Assessment and Plan / ED Course  I have reviewed the triage vital signs and the nursing notes.  Pertinent labs &  imaging results that were available during my care of the patient were reviewed by me and considered in my medical decision making (see chart for details).     Pt reports some relief after douneb.  Pt reports he usually gets decadron and prednisone when his astma is this bad.   Final Clinical Impressions(s) / ED Diagnoses   Final diagnoses:  Moderate asthma with exacerbation, unspecified whether persistent    ED Discharge Orders        Ordered    predniSONE (DELTASONE) 10 MG tablet     05/29/17 0840    An After Visit Summary was printed and given to the patient.   Fransico Meadow, [place] 05/29/17 0840    Carmin Muskrat, MD 06/01/17 1316

## 2017-05-29 NOTE — Discharge Instructions (Signed)
Return if any problems.  See your Physician for recheck  °

## 2017-06-18 DIAGNOSIS — J3089 Other allergic rhinitis: Secondary | ICD-10-CM | POA: Diagnosis not present

## 2017-06-18 DIAGNOSIS — J301 Allergic rhinitis due to pollen: Secondary | ICD-10-CM | POA: Diagnosis not present

## 2017-06-20 ENCOUNTER — Other Ambulatory Visit: Payer: Self-pay | Admitting: Neurosurgery

## 2017-06-20 DIAGNOSIS — Z6831 Body mass index (BMI) 31.0-31.9, adult: Secondary | ICD-10-CM | POA: Diagnosis not present

## 2017-06-20 DIAGNOSIS — M5416 Radiculopathy, lumbar region: Secondary | ICD-10-CM | POA: Diagnosis not present

## 2017-06-20 DIAGNOSIS — M48061 Spinal stenosis, lumbar region without neurogenic claudication: Secondary | ICD-10-CM | POA: Diagnosis not present

## 2017-06-20 DIAGNOSIS — M25511 Pain in right shoulder: Secondary | ICD-10-CM | POA: Diagnosis not present

## 2017-06-20 DIAGNOSIS — M545 Low back pain: Secondary | ICD-10-CM | POA: Diagnosis not present

## 2017-06-20 DIAGNOSIS — R030 Elevated blood-pressure reading, without diagnosis of hypertension: Secondary | ICD-10-CM | POA: Diagnosis not present

## 2017-06-22 ENCOUNTER — Telehealth: Payer: Self-pay

## 2017-06-22 NOTE — Telephone Encounter (Signed)
   [facility] Pre-operative Risk Assessment    Request for surgical clearance:  1. What type of surgery is being performed? Lumbar Foraminotomy w/resection of synovial cyst   2. When is this surgery scheduled? 07/26/17   3. What type of clearance is required (medical clearance vs. Pharmacy clearance to hold med vs. Both)? both  4. Are there any medications that need to be held prior to surgery and how long?blood thinners/aspirin   5. Practice name and name of physician performing surgery? [facility] Dr Erline Levine   6. What is your office phone and fax number? [phone]  [phone]   7. Anesthesia type (None, local, MAC, general) ? general   Brandon Simmons 06/22/2017, 11:26 AM  _________________________________________________________________   (provider comments below)

## 2017-06-25 DIAGNOSIS — M79601 Pain in right arm: Secondary | ICD-10-CM | POA: Diagnosis not present

## 2017-06-25 DIAGNOSIS — M7541 Impingement syndrome of right shoulder: Secondary | ICD-10-CM | POA: Diagnosis not present

## 2017-06-25 NOTE — Telephone Encounter (Signed)
Pt aware that he will not receive clearance until seen

## 2017-06-25 NOTE — Telephone Encounter (Signed)
   Primary Cardiologist:No primary care provider on file.  Chart reviewed as part of pre-operative protocol coverage. Because of Brandon Simmons's past medical history and time since last visit, Brandon Simmons will require a follow-up visit in order to better assess preoperative cardiovascular risk.  Pre-op covering staff: - Please schedule appointment and call patient to inform them. - Please contact requesting surgeon's office via preferred method (i.e, phone, fax) to inform them of need for appointment prior to surgery.  Brandon Sims DNP, ANP,AACC    06/25/2017, 4:12 PM

## 2017-06-25 NOTE — Telephone Encounter (Signed)
Appt scheduled for preop 5-1

## 2017-06-26 NOTE — Telephone Encounter (Signed)
Patient has an appointment on 07/18/17

## 2017-07-05 DIAGNOSIS — J301 Allergic rhinitis due to pollen: Secondary | ICD-10-CM | POA: Diagnosis not present

## 2017-07-05 DIAGNOSIS — J3089 Other allergic rhinitis: Secondary | ICD-10-CM | POA: Diagnosis not present

## 2017-07-07 NOTE — H&P (Signed)
Patient ID:   [id number] Patient: Brandon Simmons  Date of Birth: 1945/05/13 Visit Type: Office Visit   Date: 06/20/2017 02:15 PM Provider: Marchia Meiers. Vertell Limber MD   This 72 year old male presents for back pain and Shoulder pain.  HISTORY OF PRESENT ILLNESS:  1.  back pain  Pt has synovial cyst at the L3-4 level. Pt c/o right thigh pain 3/10 on average but 7-8/10 in the morning. Also c/o weakness in right leg as well. Pain was relieved with gabapentin initially, but relief has been reduced.  X-ray reveals dextroconvex lumbar scoliosis at L2-3 and spondylolisthesis at L3-4 measuring 61mm neutral, 48mm flexion, 35mm extension. MRI shows mild bilateral foraminal stenosis at L3-4.    2.  Shoulder pain  Pt c/o pain and weakness in right arm starting at biceps down into hand with trouble raising arm, but no pain upon support. PE reveals possible shoulder impingement or rotator cuff issue. Recommend orthopedic consultation.           PAST MEDICAL/SURGICAL HISTORY   (Reviewed, updated)  Disease/disorder Onset Date Management Date Comments    prostatectomy    Cancer, prostate        Appendectomy 1967     Cataract extraction    Arrhythmia      Arthritis      Asthma         PAST MEDICAL HISTORY, SURGICAL HISTORY, FAMILY HISTORY, SOCIAL HISTORY AND REVIEW OF SYSTEMS I have reviewed the patient's past medical, surgical, family and social history as well as the comprehensive review of systems as included on the [facility] history form dated 05/07/2017, which I have signed.  Family History:  (Reviewed, updated) Relationship Family Member Name Deceased Age at Death Condition Onset Age Cause of Death      Family history of Cancer, skin  N      Family history of Coronary artery disease  N      Family history of Myocardial infarction  N      Family history of Arthritis  N     MEDICATIONS: (added, continued or stopped this visit) Started Medication Directions  Instruction Stopped   Ambien 10 mg tablet take 1 tablet by oral route  every day at bedtime     Asmanex HFA 100 mcg/actuation aerosol inhaler inhale 1 puff by inhalation route  every day in the morning     atorvastatin 20 mg tablet take 1 tablet by oral route  every day     Cartia XT 120 mg capsule,extended release take 1 capsule by oral route  every day     diltiazem 30 mg tablet take 1 tablet by oral route 3 times every day     flecainide 100 mg tablet take 1 tablet by oral route  every 12 hours     gabapentin 100 mg capsule take 1 capsule by oral route 2 times every day     gabapentin 300 mg capsule take 1 capsule by oral route 3 times every day     hydrocodone 2.5 mg-acetaminophen 325 mg tablet take 1 tablet by oral route  every 4 - 6 hours as needed     montelukast 10 mg tablet take 1 tablet by oral route  every day in the evening     omeprazole 20 mg capsule,delayed release take 1 capsule by oral route  every day 30 minutes to 1 hour before a meal     prednisone 10 mg tablet take 1 tablet by oral  route  every day     Ventolin HFA 90 mcg/actuation aerosol inhaler inhale 2 puff by inhalation route  every 4 - 6 hours as needed       ALLERGIES: Ingredient Reaction Medication Name Comment  NO KNOWN ALLERGIES     No known allergies. Reviewed, no changes.    PHYSICAL EXAM:   Vitals Date Temp F BP Pulse Ht In Wt Lb BMI BSA Pain Score  06/20/2017  147/86 73 70 218.2 31.31  6/10      IMPRESSION:   Pt has synovial cyst in L3-4 level. X-ray reveals dextroconvex lumbar scoliosis at L2-3 and spondylolisthesis at L3-4 measuring 64mm neutral, 27mm flexion, 48mm extension. MRI shows mild bilateral foraminal stenosis at L3-4. See Dr. Amedeo Plenty about shoulder pain.  PLAN:  Schedule surgery for L3-4 synovial cyst removal.  We discussed the pros and cons of decompression and cyst removal alone versus decompression and fusion given the mild spondylolisthesis of L3 on L4.  Since the patient has no  back pain whatsoever, and the spondylolisthesis is relatively mild and there is evidence of scoliosis at the level above, I have recommended proceeding with decompression and removal of cyst without fusion surgery but the patient is aware that he may require additional surgery in the future.  Consultation with Dr. Amedeo Plenty about shoulder issue.  Orders: Instruction(s)/Education: Assessment Instruction  R03.0 Hypertension education  Z68.31 Dietary management education, guidance, and counseling   Completed Orders (this encounter) Order Details Reason Side Interpretation Result Initial Treatment Date Region  Hypertension education Continue to monitor Simmons pressure. If Simmons pressure remains elevated contact primary care provider        Dietary management education, guidance, and counseling patient encouraged to eat a well balanced diet         Assessment/Plan   # Detail Type Description   1. Assessment Degenerative lumbar spinal stenosis (M48.061).       2. Assessment Low back pain, unspecified back pain laterality, with sciatica presence unspecified (M54.5).       3. Assessment Lumbar radiculopathy (M54.16).       4. Assessment Right shoulder pain, unspecified chronicity (M25.511).   Plan Orders Orthopedics Referral to Dr. Amedeo Plenty for Right shoulder.       5. Assessment Body mass index (BMI) 31.0-31.9, adult (Z68.31).   Plan Orders Today's instructions / counseling include(s) Dietary management education, guidance, and counseling.       6. Assessment Elevated Simmons-pressure reading, w/o diagnosis of htn (R03.0).         Pain Management Plan Pain Scale: 6/10. Method: Numeric Pain Intensity Scale. Location: leg and right arm. Onset: 06/04/2016. Duration: varies. Quality: discomforting. Pain management follow-up plan of care: Patient is taking OTC pain relievers for relief..              Provider:  Vertell Limber MD, Marchia Meiers 06/21/2017 5:06 PM  Dictation edited by: Marchia Meiers.  Vertell Limber    CC Providers: Leanna Battles [facility] [address] [place],  [place]  [address]-   Leanna Battles  [facility] [address] [place], [place] [address]-              Electronically signed by Marchia Meiers. Vertell Limber MD on 06/21/2017 05:06 PM  The Patient ID:   [id number] Patient: Brandon Simmons  Date of Birth: 1945-08-15 Visit Type: Office Visit   Date: 05/07/2017 03:30 PM Provider: Marchia Meiers. Vertell Limber MD   This 72 year old male presents for back pain.  History of Present Illness: 1.  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  Symptoms began following a trip to [place] in March 2018. Initially thought there result of a tick bite, treatment provided no relief.   ESI x3 provided short-term relief only Physical therapy offered no lasting relief Dry needling offered no lasting relief    Patient has stopped his power walks and now only uses stationary bike  Norco 2.5/162.5 taken rarely Gabapentin 300 mg q.h.s. Has proven very helpful  MRI on [place]  MRI demonstrates significant stenosis at L3-4, right greater than left with degenerative changes and also 6 mm synovial cyst on the right side the spinal canal which appears to be causing right L4 nerve root compression.   The patient states that previously when his pain was most severe it was 12/10 but he feels that gabapentin has made a big difference and he is starting to get some improvement.  He currently notes right thigh pain and denies low back pain.  He has noted some difficulty with his gait due to this problem.          PAST MEDICAL HISTORY, SURGICAL HISTORY, FAMILY HISTORY, SOCIAL HISTORY AND REVIEW OF SYSTEMS I have reviewed the patient's past medical, surgical, family and social history as well as the comprehensive review of systems as included on the [facility] history form dated 05/07/2017, which I have signed.   MEDICATIONS(added, continued or stopped this visit): Started Medication Directions Instruction Stopped   Ambien 10 mg tablet take 1 tablet by oral route  every day at bedtime     Asmanex HFA 100 mcg/actuation aerosol inhaler inhale 1 puff by inhalation route  every day in the morning     atorvastatin 20 mg tablet take 1 tablet by oral route  every day     Cartia XT 120 mg capsule,extended release take 1 capsule by oral route  every day     diltiazem 30 mg tablet take 1 tablet by oral route 3 times every day     flecainide 100 mg tablet take 1 tablet by oral route  every 12 hours     gabapentin 300 mg capsule take 1 capsule by oral route 3 times every day     hydrocodone 2.5 mg-acetaminophen 325 mg tablet take 1 tablet by oral route  every 4 - 6 hours as needed     montelukast 10 mg tablet take 1 tablet by oral route  every day in the evening     omeprazole 20 mg capsule,delayed release take 1 capsule by oral route  every day 30 minutes to 1 hour before a meal     prednisone 10 mg tablet take 1 tablet by oral route  every day     Ventolin HFA 90 mcg/actuation aerosol inhaler inhale 2 puff by inhalation route  every 4 - 6 hours as needed       ALLERGIES: Ingredient Reaction Medication Name Comment  NO KNOWN ALLERGIES     No known allergies.   Review of Systems System Neg/Pos Details  Constitutional Negative Chills, Fatigue, Fever, Malaise, Night sweats, Weight gain and Weight loss.  ENMT Negative Ear drainage, Hearing loss, Nasal drainage, Otalgia, Sinus pressure and Sore throat.  Eyes Negative Eye discharge, Eye pain and Vision changes.  Respiratory Negative Chronic cough, Cough, Dyspnea, Known TB exposure and Wheezing.  Cardio Negative Chest pain, Claudication, Edema  and Irregular heartbeat/palpitations.  GI Negative Abdominal pain, Simmons in stool, Change in stool pattern, Constipation, Decreased appetite, Diarrhea,  Heartburn, Nausea and Vomiting.  GU Negative Dribbling, Dysuria, Erectile dysfunction, Hematuria, Polyuria (Genitourinary), Slow stream, Urinary frequency, Urinary incontinence and Urinary retention.  Endocrine Negative Cold intolerance, Heat intolerance, Polydipsia and Polyphagia.  Neuro Negative Dizziness, Extremity weakness, Gait disturbance, Headache, Memory impairment, Numbness in extremity, Seizures and Tremors.  Psych Negative Anxiety, Depression and Insomnia.  Integumentary Negative Brittle hair, Brittle nails, Change in shape/size of mole(s), Hair loss, Hirsutism, Hives, Pruritus, Rash and Skin lesion.  MS Positive RLE pain, left foot pain.  Hema/Lymph Negative Easy bleeding, Easy bruising and Lymphadenopathy.  Allergic/Immuno Negative Contact allergy, Environmental allergies, Food allergies and Seasonal allergies.  Reproductive Negative Penile discharge and Sexual dysfunction.   Vitals Date Temp F BP Pulse Ht In Wt Lb BMI BSA Pain Score  05/07/2017  154/80 68 70 217.8 31.25  3/10     PHYSICAL EXAM General Level of Distress: no acute distress Overall Appearance: normal  Head and Face  Right Left  Fundoscopic Exam:  normal normal    Cardiovascular Cardiac: regular rate and rhythm without murmur  Right Left  Carotid Pulses: normal normal  Respiratory Lungs: clear to auscultation  Neurological Orientation: normal Recent and Remote Memory: normal Attention Span and Concentration:   normal Language: normal Fund of Knowledge: normal  Right Left Sensation: normal normal Upper Extremity Coordination: normal normal  Lower Extremity Coordination: normal normal  Musculoskeletal Gait and Station: normal  Right Left Upper Extremity Muscle Strength: normal normal Lower Extremity Muscle Strength: normal normal Upper Extremity Muscle Tone:  normal normal Lower Extremity Muscle Tone: normal normal   Motor Strength Upper and lower extremity motor strength was tested in  the clinically pertinent muscles.     Deep Tendon Reflexes  Right Left Biceps: normal normal Triceps: normal normal Brachioradialis: normal normal Patellar: absent normal Achilles: normal normal  Sensory Sensation was tested at L1 to S1. Any abnormal findings will be noted below.  Right Left L3: decreased    Cranial Nerves II. Optic Nerve/Visual Fields: normal III. Oculomotor: normal IV. Trochlear: normal V. Trigeminal: normal VI. Abducens: normal VII. Facial: normal VIII. Acoustic/Vestibular: normal IX. Glossopharyngeal: normal X. Vagus: normal XI. Spinal Accessory: normal XII. Hypoglossal: normal  Motor and other Tests Lhermittes: negative Rhomberg: negative Pronator drift: absent     Right Left Hoffman's: normal normal Clonus: normal normal Babinski: normal normal SLR: negative negative Patrick's Corky Sox): negative negative Toe Walk: normal normal Toe Lift: normal normal Heel Walk: normal normal SI Joint: nontender nontender   Additional Findings:  Patient denies low back pain to palpation.  He is able to bend touch his toes.  He is able to stand on his heels and toes.     IMPRESSION The patient has had some improvement with gabapentin but has significant stenosis and synovial cyst L3-4 right.  He has numbness in his right thigh in an L3 distribution and has absent knee jerk on the right compared to the left.   Completed Orders (this encounter) Order Details Reason Side Interpretation Result Initial Treatment Date Region  Lumbar Spine- AP/Lat/Flex/Ex      05/07/2017 All Levels to All Levels  Hypertension education Continue to monitor Simmons pressure. If Simmons pressure remains elevated contact primary care provider        Dietary management education, guidance, and counseling patient encouraged to eat a well balanced diet         Assessment/Plan #  Detail Type Description   1. Assessment Low back pain, unspecified back pain laterality, with sciatica  presence unspecified (M54.5).       2. Assessment Degenerative lumbar spinal stenosis (M48.061).       3. 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       4. Assessment Lumbar radiculopathy (M54.16).       5. Assessment Elevated Simmons-pressure reading, w/o diagnosis of htn (R03.0).       6. Assessment Body mass index (BMI) 31.0-31.9, adult (Z68.31).   Plan Orders Today's instructions / counseling include(s) Dietary management education, guidance, and counseling.           Pain Management Plan Pain Scale: 3/10. Method: Numeric Pain Intensity Scale. Location: lower back right leg. Onset: 06/04/2016. Duration: varies. Quality: discomforting. Pain management follow-up plan of care: Patient is taking OTC pain relievers for relief..  The I have recommended obtaining four view lumbar radiographs to make sure there is no instability at the L3-4 level.  He is going to continue gabapentin and will push the dosage up to 3 times daily as he is able to tolerate.  I will see him back in 6 weeks to see if he is getting better or if he is continuing to have problems with this.   Orders: Diagnostic Procedures: Assessment Procedure  M54.16 Lumbar Spine- AP/Lat/Flex/Ex  M71.38 Lumbar Spine- AP/Lat/Flex/Ex  Instruction(s)/Education: Assessment Instruction  R03.0 Hypertension education  Z68.31 Dietary management education, guidance, and counseling             Provider:  Vertell Limber MD, Marchia Meiers 05/13/2017 1:49 PM  Dictation edited by: Marchia Meiers. Vertell Limber    CC Providers: Leanna Battles [facility] [address] [place],  [place]  [address]-   Leanna Battles  [facility] [address] [place], [place] [address]-              Electronically signed by Marchia Meiers. Vertell Limber MD on 05/13/2017 01:49 PM

## 2017-07-11 DIAGNOSIS — M545 Low back pain: Secondary | ICD-10-CM | POA: Diagnosis not present

## 2017-07-11 DIAGNOSIS — M7541 Impingement syndrome of right shoulder: Secondary | ICD-10-CM | POA: Diagnosis not present

## 2017-07-16 ENCOUNTER — Ambulatory Visit: Payer: Self-pay | Admitting: Adult Health

## 2017-07-16 DIAGNOSIS — M7541 Impingement syndrome of right shoulder: Secondary | ICD-10-CM | POA: Diagnosis not present

## 2017-07-17 NOTE — Progress Notes (Addendum)
Anesthesia Chart Review:   Case:  [id number] Date/Time:  07/26/17 1400   Procedure:  RIGHT LUMBAR 3- LUMBAR 4 FORAMINOTOMY WITH RESECTION OF SYNOVIAL CYST (Right ) - RIGHT LUMBAR 3- LUMBAR 4 FORAMINOTOMY WITH RESECTION OF SYNOVIAL CYST   Anesthesia type:  General   Pre-op diagnosis:  DEGENERATIVE LUMBAR SPINAL STENOSIS   Location:  [facility] OR ROOM 21 / [facility]   Surgeon:  Erline Levine, MD      DISCUSSION: Patient is a 72 year old male scheduled for the above procedure.  History includes former smoker, PAF (diagnosed 2016; [facility]; recurrent 07/23/15 during business trip to [place]), HLD, prostate cancer (s/p prostatectomy 04/25/06), GERD, asthma, laryngeal surgery.  Patient was seen by Jory Sims, NP for cardiology clearance on 07/18/17. She wrote, "Given past medical history and time since last visit, based on ACC/AHA guidelines, Brandon Simmons. would be at acceptable risk for the planned procedure without further cardiovascular testing. He may hold ASA for two days prior to surgery and resume after." Per communication with her, a follow-up echo will eventually be recommended given his his history of PAF and mildly dilated ascending aorta on  previous echo, but not planned prior to this surgery. (I left a message for [facility] at Dr. Melven Sartorius office regarding cardiology recommendation to hold ASA for 2 days prior to surgery and to contact cardiology if any concerns.)  Based on currently available information, I would anticipate that he can proceed as planned if no acute changes.   VS: BP (!) 142/75   Pulse 61   Temp 36.6 C   Resp 20   Ht 5\' 10"  (1.778 m)   Wt 214 lb 1.6 oz (97.1 kg)   SpO2 99%   BMI 30.72 kg/m   PROVIDERS: Leanna Battles, MD is PCP  - Minus Breeding, MD is primary cardiologist. Last visit 07/18/17 with Jory Sims, DNP, ANP as described above. He is also followed at the [facility] by Roderic Palau, NP--last visit 03/07/17 with recommendation  for continued medical management (daily flecainide, diltiazem; PRN diltiazem; ASA for chadsvasc score of 1) and PRN [facility] follow-up recommended.   [name], MD is urologist.  LABS: Preoperative labs noted.  (all labs ordered are listed, but only abnormal results are displayed)  Labs Reviewed  BASIC METABOLIC PANEL - Abnormal; Notable for the following components:      Result Value   Glucose, Bld 116 (*)    All other components within normal limits  SURGICAL PCR SCREEN  CBC    IMAGES: CXR 05/29/17: IMPRESSION: No acute pulmonary process identified.  CT Cardiac Scoring 05/18/14: IMPRESSION: 1. Coronary calcium score of 0. This was 0 percentile for age and sex matched control. 2. 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 If clinically indicated, a dedicated chest CT should be considered. (Report reviewed by Dr. Minus Breeding and on 05/30/14 he wrote, "The coronary calcium score was zero. Stress test was negative. The aorta size was slightly enlarged. However, the study was not designed to assess this. No further imaging is indicated as this was likely an artifact of the way this study is done.")    OTHER:  Sleep Study 05/05/15: IMPRESSIONS - No significant obstructive sleep apnea occurred during this study (AHI = 0.6/h). - No significant central sleep apnea occurred during this study (CAI = 0.0/h). - The patient had minimal or no oxygen desaturation during the study (Min O2 =  92.00%) - The patient snored with Moderate snoring volume. - No cardiac abnormalities were noted during this study. - Clinically significant periodic limb movements did not occur during sleep. No significant associated arousals. DIAGNOSIS - Snoring  EKG: 03/07/17: SB at 57 bpm with sinus arrhythmia.   CV: Echo 01/28/16: Study Conclusions - Left ventricle: The cavity size was normal. There was mild   concentric hypertrophy.  Systolic function was normal. The   estimated ejection fraction was in the range of 60% to 65%. Wall   motion was normal; there were no regional wall motion   abnormalities. Left ventricular diastolic function parameters   were normal. - Aorta:  Aortic root: The aortic root was normal in size. Aortic root ID, ED 43 mm. - Ascending aorta: The ascending aorta was mildly dilated. - Aortic valve: Transvalvular velocity was within the normal range.   There was no stenosis. There was mild regurgitation. - Mitral valve: Transvalvular velocity was within the normal range.   There was no evidence for stenosis. There was trivial   regurgitation. - Left atrium: The atrium was mildly dilated. - Right ventricle: The cavity size was normal. Wall thickness was   normal. Systolic function was normal. - Tricuspid valve: There was mild regurgitation. - Pulmonary arteries: Systolic pressure was within the normal   range. PA peak pressure: 24 mm Hg (S). (Comparison echo 01/05/15 scanned under Media tab: Aortic root 4.1 cm at the sinuses of Valsalva.)  ETT 02/08/15:   Blood pressure demonstrated a hypertensive response to exercise.  There was no ST segment deviation noted during stress.  Cardiac cath 01/05/15 [facility]; scanned under Media Tab, 01/05/15): Results: 1.  Right dominant coronary circulation. 2.  Large, short left main is normal. 3.  Large LAD and LADD1.  30% mid LAD stenosis. 4.  Large circumflex and OM1 are normal. 5.  Large RCA, rPL and rPDA.  20% distal RCA stenosis only. Recommendations: Ongoing CV risk factor management.  Past Medical History:  Diagnosis Date  . Arthritis   . Asthma   . Diverticulitis   . Diverticulosis   . GERD (gastroesophageal reflux disease)   . Hemorrhoids   . History of echocardiogram    a. Echo 10/16 [facility]):  EF 55%, trace MR, mild TR, mild to mod AI, mild dilated Ao root (41 mm)  . Hyperlipemia   . Organic erectile dysfunction   . PAF  (paroxysmal atrial fibrillation) ([place])    a. admx to [facility] in [place], [place] 10/16 with AF with RVR >> converted to NSR with IV Dilt;  b. Flecainide started >> ETT neg for pro-arrhythmia   . Prostate cancer ([place]) 04/25/2006   Gleason 3+4=7  . Prostatitis   . S/P cardiac cath    a. LHC at [facility] 10/16:  LM ok, mLAD 30%, LCx ok, dRCA 20%  . S/P radiation therapy 01/19/2014 through 03/05/2014                                                      Prostate bed 6600 cGy in 33 sessions                          . Torn rotator cuff     Past Surgical History:  Procedure Laterality Date  .  APPENDECTOMY     1967  . cataract surgery Left   . COLONOSCOPY    . KNEE ARTHROSCOPY     R  . LARYNX SURGERY     vocal cord lesion- benign  . POLYPECTOMY    . PROSTATECTOMY  04/25/2006    MEDICATIONS: . acetaminophen (TYLENOL) 500 MG tablet  . albuterol (PROVENTIL HFA;VENTOLIN HFA) 108 (90 BASE) MCG/ACT inhaler  . Alprostadil (PROSTAGLANDIN E1) POWD  . aspirin 81 MG tablet  . atorvastatin (LIPITOR) 20 MG tablet  . cholecalciferol (VITAMIN D) 1000 UNITS tablet  . diltiazem (CARDIZEM CD) 120 MG 24 hr capsule  . diltiazem (CARDIZEM) 30 MG tablet  . flecainide (TAMBOCOR) 150 MG tablet  . fluticasone (FLONASE) 50 MCG/ACT nasal spray  . gabapentin (NEURONTIN) 100 MG capsule  . Glucosamine-Chondroit-Vit C-Mn (GLUCOSAMINE 1500 COMPLEX) CAPS  . HYDROcodone-acetaminophen (NORCO) 5-325 MG per tablet  . L-Lysine 500 MG TABS  . Mometasone Furoate (ASMANEX HFA) 100 MCG/ACT AERO  . montelukast (SINGULAIR) 10 MG tablet  . Multiple Vitamin (MULTIVITAMIN) capsule  . Naphazoline-Glycerin (REDNESS RELIEF OP)  . omeprazole (PRILOSEC) 20 MG capsule  . Probiotic Product (ALIGN) 4 MG CAPS  . psyllium (METAMUCIL) 58.6 % powder  . vitamin C (ASCORBIC ACID) 500 MG tablet  . zolpidem (AMBIEN) 10 MG tablet   No current facility-administered medications for this encounter.    George Hugh [facility]  Short Stay Center/Anesthesiology Phone [phone] 07/19/2017 12:22 PM

## 2017-07-17 NOTE — Progress Notes (Signed)
Cardiology Office Note   Date:  07/18/2017   ID:  Brandon Alt., DOB 1945/12/28, MRN [id number]  PCP:  Leanna Battles, MD  Cardiologist:  [facility]  Chief Complaint  Patient presents with  . Follow-up     History of Present Illness: Brandon Jerrad Mendibles. is a 72 y.o. male who presents for preoperative evaluation prior to having L3-4 synovial cyst removal He has a history of PAF, HL, and prostate CA. the patient has had a history of atrial fib and was seen at [facility] where echocardiogram revealed normal LV systolic function, and reported catheterization completed during that hospitalization did not find any significant coronary artery disease.    The patient continues on diltiazem and flecainide.  He is followed in the atrial fibrillation clinic by Brandon Simmons, nurse practitioner.  He had one episode of breakthrough atrial fibrillation RVR on 02/24/2015, he was given "pill in the pocket" after transition to normal sinus rhythm on IV diltiazem.    He was last seen in the office on 03/07/2017 and was doing well remained in normal sinus rhythm.  He did have one other episode of A. fib after being on a prednisone pack and took an additional 30 mg of diltiazem resolved this.  The patient remains on aspirin only CHADS VASC Score of 1.   He is without cardiac complaints today. Denies chest pain, dyspnea, but is having some "fogginess" on gabapentin and does not want to have to take this any longer.   Past Medical History:  Diagnosis Date  . Arthritis   . Asthma   . Diverticulitis   . Diverticulosis   . GERD (gastroesophageal reflux disease)   . Hemorrhoids   . History of echocardiogram    a. Echo 10/16 [facility]):  EF 55%, trace MR, mild TR, mild to mod AI, mild dilated Ao root (41 mm)  . Hyperlipemia   . Organic erectile dysfunction   . PAF (paroxysmal atrial fibrillation) ([place])    a. admx to [facility] in [place], [place] 10/16 with AF with RVR >> converted to  NSR with IV Dilt;  b. Flecainide started >> ETT neg for pro-arrhythmia   . Prostate cancer ([place]) 04/25/2006   Gleason 3+4=7  . Prostatitis   . S/P cardiac cath    a. LHC at [facility] 10/16:  LM ok, mLAD 30%, LCx ok, dRCA 20%  . S/P radiation therapy 01/19/2014 through 03/05/2014                                                      Prostate bed 6600 cGy in 33 sessions                            Past Surgical History:  Procedure Laterality Date  . APPENDECTOMY     1967  . cataract surgery Left   . COLONOSCOPY    . KNEE ARTHROSCOPY     R  . LARYNX SURGERY     vocal cord lesion- benign  . POLYPECTOMY    . PROSTATECTOMY  04/25/2006     Current Outpatient Medications  Medication Sig Dispense Refill  . acetaminophen (TYLENOL) 500 MG tablet Take 500 mg by mouth every 6 (six) hours as needed for mild pain.    Marland Kitchen  albuterol (PROVENTIL HFA;VENTOLIN HFA) 108 (90 BASE) MCG/ACT inhaler Inhale 2 puffs into the lungs every 6 (six) hours as needed for wheezing or shortness of breath.     . Alprostadil (PROSTAGLANDIN E1) POWD 1 mL by Does not apply route as needed (FOR ERECTILE DYSFUNCTION).     Marland Kitchen aspirin 81 MG tablet Take 81 mg by mouth daily.    Marland Kitchen atorvastatin (LIPITOR) 20 MG tablet Take 20 mg by mouth daily.     . cholecalciferol (VITAMIN D) 1000 UNITS tablet Take 1,000 Units by mouth daily.    Marland Kitchen diltiazem (CARDIZEM CD) 120 MG 24 hr capsule Take 1 capsule (120 mg total) by mouth daily. 90 capsule 3  . diltiazem (CARDIZEM) 30 MG tablet Take 1 tablet every 4 hours AS NEEDED for afib rapid heart rate over 100 (Patient taking differently: Take 30-60 mg by mouth every 4 (four) hours as needed (for AFIB for heart rate > 100). ) 45 tablet 2  . flecainide (TAMBOCOR) 150 MG tablet Take 0.5 tablets (75 mg total) by mouth 2 (two) times daily. 90 tablet 2  . fluticasone (FLONASE) 50 MCG/ACT nasal spray Place 1 spray into both nostrils daily.     Marland Kitchen gabapentin (NEURONTIN) 100 MG capsule Take 100-400 mg by  mouth See admin instructions. Take 100 mg by mouth in the morning and take 400 mg by mouth at bedtime    . Glucosamine-Chondroit-Vit C-Mn (GLUCOSAMINE 1500 COMPLEX) CAPS Take 1 capsule by mouth daily.    Marland Kitchen HYDROcodone-acetaminophen (NORCO) 5-325 MG per tablet Take 1 tablet by mouth every 6 (six) hours as needed for severe pain.     Marland Kitchen L-Lysine 500 MG TABS Take 500 mg by mouth daily.     . montelukast (SINGULAIR) 10 MG tablet Take 10 mg by mouth at bedtime.    . Multiple Vitamin (MULTIVITAMIN) capsule Take 1 capsule by mouth daily.    . Naphazoline-Glycerin (REDNESS RELIEF OP) Place 2 drops into both eyes 4 (four) times daily as needed (for irritation).    Marland Kitchen omeprazole (PRILOSEC) 20 MG capsule Take 20 mg by mouth 2 (two) times daily before a meal.     . Probiotic Product (ALIGN) 4 MG CAPS Take 4 mg by mouth daily.     . psyllium (METAMUCIL) 58.6 % powder Take 1 packet by mouth 2 (two) times daily.    . vitamin C (ASCORBIC ACID) 500 MG tablet Take 500 mg by mouth daily.    Marland Kitchen zolpidem (AMBIEN) 10 MG tablet Take 10 mg by mouth at bedtime as needed for sleep.    . Mometasone Furoate (ASMANEX HFA) 100 MCG/ACT AERO Asmanex HFA 100 mcg/actuation aerosol inhaler     No current facility-administered medications for this visit.     Allergies:   Patient has no known allergies.    Social History:  The patient  reports that he has quit smoking. His smoking use included cigarettes. He has a 10.00 pack-year smoking history. He has quit using smokeless tobacco. He reports that he drinks about 4.2 oz of alcohol per week. He reports that he does not use drugs.   Family History:  The patient's family history includes Atrial fibrillation in his son; Diabetes in his maternal grandmother; Emphysema in his father; Heart disease in his maternal grandfather and paternal grandfather; Hypertension in his mother; Liver cancer in his paternal grandmother and unknown relative; Lung cancer in his father and unknown relative.      ROS: All other systems are reviewed and negative.  Unless otherwise mentioned in H&P    PHYSICAL EXAM: VS:  BP 116/68   Pulse 65   Ht 5\' 10"  (1.778 m)   Wt 215 lb 3.2 oz (97.6 kg)   BMI 30.88 kg/m  , BMI Body mass index is 30.88 kg/m. GEN: Well nourished, well developed, in no acute distress  HEENT: normal  Neck: no JVD, carotid bruits, or masses Cardiac: RRR; no murmurs, rubs, or gallops,no edema  Respiratory:  Clear to auscultation bilaterally, normal work of breathing GI: soft, nontender, nondistended, + BS MS: no deformity or atrophy  Skin: warm and dry, no rash Neuro:  Strength and sensation are intact Psych: euthymic mood, full affect   EKG:  NSR rate of 65 bpm.   Recent Labs: No results found for requested labs within last 8760 hours.    Lipid Panel No results found for: CHOL, TRIG, HDL, CHOLHDL, VLDL, LDLCALC, LDLDIRECT    Wt Readings from Last 3 Encounters:  07/18/17 215 lb 3.2 oz (97.6 kg)  03/07/17 213 lb 9.6 oz (96.9 kg)  09/13/16 218 lb 9.6 oz (99.2 kg)      Other studies Reviewed: Echocardiogram 02-23-16  Left ventricle: The cavity size was normal. There was mild   concentric hypertrophy. Systolic function was normal. The   estimated ejection fraction was in the range of 60% to 65%. Wall   motion was normal; there were no regional wall motion   abnormalities. Left ventricular diastolic function parameters   were normal. - Aortic valve: Transvalvular velocity was within the normal range.   There was no stenosis. There was mild regurgitation. - Mitral valve: Transvalvular velocity was within the normal range.   There was no evidence for stenosis. There was trivial   regurgitation. - Left atrium: The atrium was mildly dilated. - Right ventricle: The cavity size was normal. Wall thickness was   normal. Systolic function was normal. - Tricuspid valve: There was mild regurgitation. - Pulmonary arteries: Systolic pressure was within the normal    range. PA peak pressure: 24 mm Hg (S).  CT Cardiac Scoring 05/18/2014  IMPRESSION: 1. Coronary calcium score of 0. This was 0 percentile for age and sex matched control.  2. 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 If clinically indicated, a dedicated chest CT should be considered.   ASSESSMENT AND PLAN:  1.  Pre-Operative Cardiac Clearance: Patient is scheduled for removal of cyst from lumbar spine per Brandon Simmons.   Chart reviewed as part of pre-operative protocol coverage. Given past medical history and time since last visit, based on ACC/AHA guidelines, Brandon Logon [employer]. would be at acceptable risk for the planned procedure without further cardiovascular testing. He may hold ASA for two days prior to surgery and resume after.   2. PAF: Continues on diltiazem prn for paroxysms but has not had any incidences for over 3 months. Remains in NSR on today's visit per EKG and ausculation. Continue flecainide as directed. Will need to repeat echo again at some point as last was completed in 2017.   Current medicines are reviewed at length with the patient today.    Labs/ tests ordered today include: None   Brandon Myron. Brandon Simmons, ANP, AACC   07/18/2017 9:15 AM    [facility] [address] [place], [place] [address] Phone: [phone]; Fax: [phone]

## 2017-07-18 ENCOUNTER — Encounter (HOSPITAL_COMMUNITY)
Admission: RE | Admit: 2017-07-18 | Discharge: 2017-07-18 | Disposition: A | Discharge status: Home / Self Care | Payer: Medicare Other | Source: Ambulatory Visit | Attending: Neurosurgery | Admitting: Neurosurgery

## 2017-07-18 ENCOUNTER — Ambulatory Visit (INDEPENDENT_AMBULATORY_CARE_PROVIDER_SITE_OTHER): Payer: Medicare Other | Admitting: Adult Health

## 2017-07-18 ENCOUNTER — Other Ambulatory Visit: Payer: Self-pay

## 2017-07-18 ENCOUNTER — Encounter (HOSPITAL_COMMUNITY): Payer: Self-pay

## 2017-07-18 ENCOUNTER — Encounter: Payer: Self-pay | Admitting: Adult Health

## 2017-07-18 VITALS — BP 116/68 | HR 65 | Ht 70.0 in | Wt 215.2 lb

## 2017-07-18 DIAGNOSIS — Z79899 Other long term (current) drug therapy: Secondary | ICD-10-CM | POA: Insufficient documentation

## 2017-07-18 DIAGNOSIS — I2510 Atherosclerotic heart disease of native coronary artery without angina pectoris: Secondary | ICD-10-CM | POA: Insufficient documentation

## 2017-07-18 DIAGNOSIS — Z8546 Personal history of malignant neoplasm of prostate: Secondary | ICD-10-CM | POA: Diagnosis not present

## 2017-07-18 DIAGNOSIS — Z87891 Personal history of nicotine dependence: Secondary | ICD-10-CM | POA: Diagnosis not present

## 2017-07-18 DIAGNOSIS — Z01812 Encounter for preprocedural laboratory examination: Secondary | ICD-10-CM | POA: Diagnosis not present

## 2017-07-18 DIAGNOSIS — E785 Hyperlipidemia, unspecified: Secondary | ICD-10-CM | POA: Diagnosis not present

## 2017-07-18 DIAGNOSIS — K219 Gastro-esophageal reflux disease without esophagitis: Secondary | ICD-10-CM | POA: Diagnosis not present

## 2017-07-18 DIAGNOSIS — Z7982 Long term (current) use of aspirin: Secondary | ICD-10-CM | POA: Insufficient documentation

## 2017-07-18 DIAGNOSIS — Z9889 Other specified postprocedural states: Secondary | ICD-10-CM | POA: Diagnosis not present

## 2017-07-18 DIAGNOSIS — I480 Paroxysmal atrial fibrillation: Secondary | ICD-10-CM | POA: Diagnosis not present

## 2017-07-18 DIAGNOSIS — J45909 Unspecified asthma, uncomplicated: Secondary | ICD-10-CM | POA: Diagnosis not present

## 2017-07-18 DIAGNOSIS — Z01810 Encounter for preprocedural cardiovascular examination: Secondary | ICD-10-CM

## 2017-07-18 HISTORY — DX: Unspecified rotator cuff tear or rupture of unspecified shoulder, not specified as traumatic: M75.100

## 2017-07-18 LAB — BASIC METABOLIC PANEL
Anion gap: 8 (ref 5–15)
BUN: 15 mg/dL (ref 6–20)
CO2: 28 mmol/L (ref 22–32)
Calcium: 9.2 mg/dL (ref 8.9–10.3)
Chloride: 105 mmol/L (ref 101–111)
Creatinine, Ser: 1 mg/dL (ref 0.61–1.24)
GFR calc Af Amer: >60 mL/min (ref >60)
GFR calc non Af Amer: >60 mL/min (ref >60)
Glucose, Bld: 116 mg/dL — ABNORMAL HIGH (ref 65–99)
Potassium: 3.9 mmol/L (ref 3.5–5.1)
Sodium: 141 mmol/L (ref 135–145)

## 2017-07-18 LAB — CBC
HCT: 40.8 % (ref 39.0–52.0)
Hemoglobin: 13.5 g/dL (ref 13.0–17.0)
MCH: 31.8 pg (ref 26.0–34.0)
MCHC: 33.1 g/dL (ref 30.0–36.0)
MCV: 96.2 fL (ref 78.0–100.0)
Platelets: 159 10*3/uL (ref 150–400)
RBC: 4.24 MIL/uL (ref 4.22–5.81)
RDW: 12.7 % (ref 11.5–15.5)
WBC: 9.8 10*3/uL (ref 4.0–10.5)

## 2017-07-18 LAB — SURGICAL PCR SCREEN
MRSA, PCR: NEGATIVE
Staphylococcus aureus: NEGATIVE

## 2017-07-18 NOTE — Progress Notes (Addendum)
PCP: Leanna Battles, MD  Cardiologist: Minus Breeding, MD  EKG: 03/07/17 in EPIC  Stress test: 02/08/15 in EPIC  ECHO: 01/28/2016 in EPIC  Cardiac Cath: 01/05/15 in EPIC  Chest x-ray: 05/29/17  Patient given a steroid injection on Monday for a torn rotator cuff, advised his [place] may be elevated

## 2017-07-18 NOTE — Patient Instructions (Addendum)
CLEARED FOR RIGHT LUMBAR 3- LUMBAR 4 FORAMINOTOMY WITH RESECTION OF SYNOVIAL CYST [facility] Erline Levine -HOLD ASPIRIN 2 DAYS PRIOR

## 2017-07-18 NOTE — Pre-Procedure Instructions (Signed)
Kham Oren Section.  07/18/2017      [id number] [address], [place] [address] Phone: [phone] Fax: [phone]    Your procedure is scheduled on Jul 26, 2017.  Report to [facility] at [employer] PM.  Call this number if you have problems the morning of surgery:  [phone]   Continue all medications as directed by your physician except follow these medication instructions before surgery below   Remember:  Do not eat food or drink liquids after midnight.  Take these medicines the morning of surgery with A SIP OF WATER  Tylenol-if needed Albuterol inhaler-if needed (bring inhaler with you) Diltiazem (cardizem) Flecainide (tambocor) Fluticasone (flonase) Hydrocodone-acetaminophen (norco)-if needed for pain Eye drops-if needed Omeprazole (prilosec)  Follow your doctor's instructions on when to begin holding aspirin.  7 days prior to surgery STOP taking any Aleve, Naproxen, Ibuprofen, Motrin, Advil, Goody's, BC's, all herbal medications, fish oil, and all vitamins    Contacts, dentures or bridgework may not be worn into surgery.  Leave your suitcase in the car.  After surgery it may be brought to your room.  For patients admitted to the hospital, discharge time will be determined by your treatment team.  Patients discharged the day of surgery will not be allowed to drive home.   [facility]- Preparing For Surgery  Before surgery, you can play an important role. Because skin is not sterile, your skin needs to be as free of germs as possible. You can reduce the number of germs on your skin by washing with CHG (chlorahexidine gluconate) Soap before surgery.  CHG is an antiseptic cleaner which kills germs and bonds with the skin to continue killing germs even after washing.  Please do not use if you have an allergy to CHG or antibacterial soaps. If your skin becomes reddened/irritated stop using  the CHG.  Do not shave (including legs and underarms) for at least 48 hours prior to first CHG shower. It is OK to shave your face.  Please follow these instructions carefully.   1. Shower the NIGHT BEFORE SURGERY and the MORNING OF SURGERY with CHG.   2. If you chose to wash your hair, wash your hair first as usual with your normal shampoo.  3. After you shampoo, rinse your hair and body thoroughly to remove the shampoo.  4. Use CHG as you would any other liquid soap. You can apply CHG directly to the skin and wash gently with a scrungie or a clean washcloth.   5. Apply the CHG Soap to your body ONLY FROM THE NECK DOWN.  Do not use on open wounds or open sores. Avoid contact with your eyes, ears, mouth and genitals (private parts). Wash Face and genitals (private parts)  with your normal soap.  6. Wash thoroughly, paying special attention to the area where your surgery will be performed.  7. Thoroughly rinse your body with warm water from the neck down.  8. DO NOT shower/wash with your normal soap after using and rinsing off the CHG Soap.  9. Pat yourself dry with a CLEAN TOWEL.  10. Wear CLEAN PAJAMAS to bed the night before surgery, wear comfortable clothes the morning of surgery  11. Place CLEAN SHEETS on your bed the night of your first shower and DO NOT SLEEP WITH PETS.  Day of Surgery: Do not apply any deodorants/lotions. Please wear clean clothes to the hospital/surgery center.  Do not wear jewelry, make-up or nail polish.  Do not wear lotions, powders, or perfumes, or deodorant.  Men may shave face and neck.  Do not bring valuables to the hospital.  [facility] is not responsible for any belongings or valuables.  Please read over the following fact sheets that you were given. Pain Booklet, Coughing and Deep Breathing, MRSA Information and Surgical Site Infection Prevention

## 2017-07-19 ENCOUNTER — Encounter (HOSPITAL_COMMUNITY): Payer: Self-pay

## 2017-07-19 NOTE — Progress Notes (Addendum)
Anesthesia Chart Review:   Case:  [id number] Date/Time:  07/26/17 1400   Procedure:  RIGHT LUMBAR 3- LUMBAR 4 FORAMINOTOMY WITH RESECTION OF SYNOVIAL CYST (Right ) - RIGHT LUMBAR 3- LUMBAR 4 FORAMINOTOMY WITH RESECTION OF SYNOVIAL CYST   Anesthesia type:  General   Pre-op diagnosis:  DEGENERATIVE LUMBAR SPINAL STENOSIS   Location:  [facility] OR ROOM 21 / [facility]   Surgeon:  Erline Levine, MD      DISCUSSION: Pt is a 72 year old male with hx afib. Has cardiac clearance for surgery     VS: BP (!) 142/75   Pulse 61   Temp 36.6 C   Resp 20   Ht 5\' 10"  (1.778 m)   Wt 214 lb 1.6 oz (97.1 kg)   SpO2 99%   BMI 30.72 kg/m    PROVIDERS:  PCP is Leanna Battles, MD Cardiologist is Minus Breeding, MD. Pt cleared for surgery at acceptable risk at last office visit 07/18/17 with Jory Sims, NP   LABS: Labs reviewed: Acceptable for surgery. (all labs ordered are listed, but only abnormal results are displayed)  Labs Reviewed  BASIC METABOLIC PANEL - Abnormal; Notable for the following components:      Result Value   Glucose, Bld 116 (*)    All other components within normal limits  SURGICAL PCR SCREEN  CBC     IMAGES:  CXR 05/29/17: No acute pulmonary process identified.   EKG 07/18/17: NSR.  Nonspecific T wave abnormality.   CV:  Echo 01/28/16:  - Left ventricle: The cavity size was normal. There was mild concentric hypertrophy. Systolic function was normal. The estimated ejection fraction was in the range of 60% to 65%. Wall motion was normal; there were no regional wall motion abnormalities. Left ventricular diastolic function parameters were normal. - Aortic valve: Transvalvular velocity was within the normal range. There was no stenosis. There was mild regurgitation. - Mitral valve: Transvalvular velocity was within the normal range. There was no evidence for stenosis. There was trivial regurgitation. - Left atrium: The atrium was mildly dilated. - Right ventricle: The cavity  size was normal. Wall thickness was normal. Systolic function was normal. - Tricuspid valve: There was mild regurgitation. - Pulmonary arteries: Systolic pressure was within the normal range. PA peak pressure: 24 mm Hg (S).   Exercise tolerance test 02/08/15:   Blood pressure demonstrated a hypertensive response to exercise.  There was no ST segment deviation noted during stress.   Cardiac cath 01/05/15 (found in correspondence labeled "Diagnostic Cardiac Cath" 01/05/15 in media tab):  1. LM normal 2. LAD: 30% mid stenosis 3. CX normal 4. RCA. 20% distal stenosis   Past Medical History:  Diagnosis Date  . Arthritis   . Asthma   . Diverticulitis   . Diverticulosis   . GERD (gastroesophageal reflux disease)   . Hemorrhoids   . History of echocardiogram    a. Echo 10/16 [facility]):  EF 55%, trace MR, mild TR, mild to mod AI, mild dilated Ao root (41 mm)  . Hyperlipemia   . Organic erectile dysfunction   . PAF (paroxysmal atrial fibrillation) ([place])    a. admx to [facility] in [place], [place] 10/16 with AF with RVR >> converted to NSR with IV Dilt;  b. Flecainide started >> ETT neg for pro-arrhythmia   . Prostate cancer ([place]) 04/25/2006   Gleason 3+4=7  . Prostatitis   . S/P cardiac cath    a. LHC at [facility] 10/16:  LM ok, mLAD 30%, LCx ok, dRCA 20%  . S/P radiation therapy 01/19/2014 through 03/05/2014                                                      Prostate bed 6600 cGy in 33 sessions                          . Torn rotator cuff     Past Surgical History:  Procedure Laterality Date  . APPENDECTOMY     1967  . cataract surgery Left   . COLONOSCOPY    . KNEE ARTHROSCOPY     R  . LARYNX SURGERY     vocal cord lesion- benign  . POLYPECTOMY    . PROSTATECTOMY  04/25/2006    MEDICATIONS: . acetaminophen (TYLENOL) 500 MG tablet  . albuterol (PROVENTIL HFA;VENTOLIN HFA) 108 (90 BASE) MCG/ACT inhaler  . Alprostadil (PROSTAGLANDIN E1) POWD  . aspirin  81 MG tablet  . atorvastatin (LIPITOR) 20 MG tablet  . cholecalciferol (VITAMIN D) 1000 UNITS tablet  . diltiazem (CARDIZEM CD) 120 MG 24 hr capsule  . diltiazem (CARDIZEM) 30 MG tablet  . flecainide (TAMBOCOR) 150 MG tablet  . fluticasone (FLONASE) 50 MCG/ACT nasal spray  . gabapentin (NEURONTIN) 100 MG capsule  . Glucosamine-Chondroit-Vit C-Mn (GLUCOSAMINE 1500 COMPLEX) CAPS  . HYDROcodone-acetaminophen (NORCO) 5-325 MG per tablet  . L-Lysine 500 MG TABS  . Mometasone Furoate (ASMANEX HFA) 100 MCG/ACT AERO  . montelukast (SINGULAIR) 10 MG tablet  . Multiple Vitamin (MULTIVITAMIN) capsule  . Naphazoline-Glycerin (REDNESS RELIEF OP)  . omeprazole (PRILOSEC) 20 MG capsule  . Probiotic Product (ALIGN) 4 MG CAPS  . psyllium (METAMUCIL) 58.6 % powder  . vitamin C (ASCORBIC ACID) 500 MG tablet  . zolpidem (AMBIEN) 10 MG tablet   No current facility-administered medications for this encounter.    - Pt to stop ASA 2 days before surgery   If no changes, I anticipate pt can proceed with surgery as scheduled.   Willeen Cass, FNP-BC [facility]/Anesthesiology Phone: [phone] 07/19/2017 10:37 AM

## 2017-07-20 DIAGNOSIS — J301 Allergic rhinitis due to pollen: Secondary | ICD-10-CM | POA: Diagnosis not present

## 2017-07-20 DIAGNOSIS — J3089 Other allergic rhinitis: Secondary | ICD-10-CM | POA: Diagnosis not present

## 2017-07-26 ENCOUNTER — Other Ambulatory Visit: Payer: Self-pay

## 2017-07-26 ENCOUNTER — Inpatient Hospital Stay (HOSPITAL_COMMUNITY)
Admission: RE | Disposition: A | Discharge status: Home / Self Care | Payer: Self-pay | Source: Ambulatory Visit | Attending: Neurosurgery

## 2017-07-26 ENCOUNTER — Encounter (HOSPITAL_COMMUNITY): Payer: Self-pay | Admitting: *Deleted

## 2017-07-26 ENCOUNTER — Inpatient Hospital Stay (HOSPITAL_COMMUNITY): Payer: Medicare Other | Admitting: Vascular Surgery

## 2017-07-26 ENCOUNTER — Inpatient Hospital Stay (HOSPITAL_COMMUNITY)
Admission: RE | Admit: 2017-07-26 | Discharge: 2017-07-27 | DRG: 517 | Disposition: A | Discharge status: Home / Self Care | Payer: Medicare Other | Source: Ambulatory Visit | Attending: Neurosurgery | Admitting: Neurosurgery

## 2017-07-26 ENCOUNTER — Inpatient Hospital Stay (HOSPITAL_COMMUNITY): Payer: Medicare Other | Admitting: Anesthesiology

## 2017-07-26 ENCOUNTER — Inpatient Hospital Stay (HOSPITAL_COMMUNITY): Payer: Medicare Other

## 2017-07-26 DIAGNOSIS — Z8261 Family history of arthritis: Secondary | ICD-10-CM | POA: Diagnosis not present

## 2017-07-26 DIAGNOSIS — M7138 Other bursal cyst, other site: Principal | ICD-10-CM | POA: Diagnosis present

## 2017-07-26 DIAGNOSIS — Z808 Family history of malignant neoplasm of other organs or systems: Secondary | ICD-10-CM | POA: Diagnosis not present

## 2017-07-26 DIAGNOSIS — R030 Elevated blood-pressure reading, without diagnosis of hypertension: Secondary | ICD-10-CM | POA: Diagnosis present

## 2017-07-26 DIAGNOSIS — Z9079 Acquired absence of other genital organ(s): Secondary | ICD-10-CM

## 2017-07-26 DIAGNOSIS — R531 Weakness: Secondary | ICD-10-CM | POA: Diagnosis present

## 2017-07-26 DIAGNOSIS — M1990 Unspecified osteoarthritis, unspecified site: Secondary | ICD-10-CM | POA: Diagnosis present

## 2017-07-26 DIAGNOSIS — I10 Essential (primary) hypertension: Secondary | ICD-10-CM | POA: Diagnosis not present

## 2017-07-26 DIAGNOSIS — M25511 Pain in right shoulder: Secondary | ICD-10-CM | POA: Diagnosis present

## 2017-07-26 DIAGNOSIS — Z8546 Personal history of malignant neoplasm of prostate: Secondary | ICD-10-CM | POA: Diagnosis not present

## 2017-07-26 DIAGNOSIS — M5416 Radiculopathy, lumbar region: Secondary | ICD-10-CM | POA: Diagnosis present

## 2017-07-26 DIAGNOSIS — J45909 Unspecified asthma, uncomplicated: Secondary | ICD-10-CM | POA: Diagnosis present

## 2017-07-26 DIAGNOSIS — M4316 Spondylolisthesis, lumbar region: Secondary | ICD-10-CM | POA: Diagnosis present

## 2017-07-26 DIAGNOSIS — Z981 Arthrodesis status: Secondary | ICD-10-CM | POA: Diagnosis not present

## 2017-07-26 DIAGNOSIS — M48061 Spinal stenosis, lumbar region without neurogenic claudication: Secondary | ICD-10-CM | POA: Diagnosis present

## 2017-07-26 DIAGNOSIS — Z419 Encounter for procedure for purposes other than remedying health state, unspecified: Secondary | ICD-10-CM

## 2017-07-26 HISTORY — PX: LUMBAR LAMINECTOMY/DECOMPRESSION MICRODISCECTOMY: SHX5026

## 2017-07-26 SURGERY — LUMBAR LAMINECTOMY/DECOMPRESSION MICRODISCECTOMY 1 LEVEL
Anesthesia: General | Site: Back | Laterality: Right

## 2017-07-26 MED ORDER — LIDOCAINE 2% (20 MG/ML) 5 ML SYRINGE
INTRAMUSCULAR | Status: DC | PRN
  Administered 2017-07-26: 100 mg via INTRAVENOUS

## 2017-07-26 MED ORDER — NEOSTIGMINE METHYLSULFATE 5 MG/5ML IV SOSY
PREFILLED_SYRINGE | INTRAVENOUS | Status: AC
  Filled 2017-07-26: qty 5

## 2017-07-26 MED ORDER — RISAQUAD PO CAPS
2.0000 | ORAL_CAPSULE | Freq: Every day | ORAL | Status: DC
  Filled 2017-07-26: qty 2

## 2017-07-26 MED ORDER — FENTANYL CITRATE (PF) 100 MCG/2ML IJ SOLN
INTRAMUSCULAR | Status: AC
  Filled 2017-07-26: qty 2

## 2017-07-26 MED ORDER — FENTANYL CITRATE (PF) 100 MCG/2ML IJ SOLN
INTRAMUSCULAR | Status: DC | PRN
  Administered 2017-07-26: 100 ug via INTRAVENOUS

## 2017-07-26 MED ORDER — GABAPENTIN 100 MG PO CAPS: 100.0000 mg | ORAL_CAPSULE | Freq: Every day | ORAL | Status: DC

## 2017-07-26 MED ORDER — ONDANSETRON HCL 4 MG/2ML IJ SOLN
INTRAMUSCULAR | Status: DC | PRN
  Administered 2017-07-26 (×2): 4 mg via INTRAVENOUS

## 2017-07-26 MED ORDER — PSYLLIUM 95 % PO PACK
1.0000 | PACK | Freq: Two times a day (BID) | ORAL | Status: DC
  Administered 2017-07-26: 1 via ORAL
  Filled 2017-07-26 (×2): qty 1

## 2017-07-26 MED ORDER — SODIUM CHLORIDE 0.9 % IV SOLN: 250.0000 mL | INTRAVENOUS | Status: DC

## 2017-07-26 MED ORDER — METHOCARBAMOL 1000 MG/10ML IJ SOLN
500.0000 mg | Freq: Four times a day (QID) | INTRAMUSCULAR | Status: DC | PRN
  Filled 2017-07-26: qty 5

## 2017-07-26 MED ORDER — DEXAMETHASONE SODIUM PHOSPHATE 10 MG/ML IJ SOLN
INTRAMUSCULAR | Status: AC
  Filled 2017-07-26: qty 1

## 2017-07-26 MED ORDER — SODIUM CHLORIDE 0.9% FLUSH: 3.0000 mL | INTRAVENOUS | Status: DC | PRN

## 2017-07-26 MED ORDER — FENTANYL CITRATE (PF) 100 MCG/2ML IJ SOLN
25.0000 ug | INTRAMUSCULAR | Status: DC | PRN
  Administered 2017-07-26 (×3): 50 ug via INTRAVENOUS

## 2017-07-26 MED ORDER — LIDOCAINE-EPINEPHRINE 1 %-1:100000 IJ SOLN
INTRAMUSCULAR | Status: AC
  Filled 2017-07-26: qty 1

## 2017-07-26 MED ORDER — MULTIVITAMINS PO CAPS: 1.0000 | ORAL_CAPSULE | Freq: Every day | ORAL | Status: DC

## 2017-07-26 MED ORDER — SENNOSIDES-DOCUSATE SODIUM 8.6-50 MG PO TABS: 1.0000 | ORAL_TABLET | Freq: Every evening | ORAL | Status: DC | PRN

## 2017-07-26 MED ORDER — METHOCARBAMOL 500 MG PO TABS
500.0000 mg | ORAL_TABLET | Freq: Four times a day (QID) | ORAL | Status: DC | PRN
  Administered 2017-07-26 – 2017-07-27 (×3): 500 mg via ORAL
  Filled 2017-07-26 (×2): qty 1

## 2017-07-26 MED ORDER — BUPIVACAINE HCL (PF) 0.5 % IJ SOLN
INTRAMUSCULAR | Status: DC | PRN
  Administered 2017-07-26: 5 mL

## 2017-07-26 MED ORDER — FLUTICASONE PROPIONATE 50 MCG/ACT NA SUSP
1.0000 | Freq: Every day | NASAL | Status: DC
  Filled 2017-07-26: qty 16

## 2017-07-26 MED ORDER — MENTHOL 3 MG MT LOZG: 1.0000 | LOZENGE | OROMUCOSAL | Status: DC | PRN

## 2017-07-26 MED ORDER — ACETAMINOPHEN 650 MG RE SUPP
650.0000 mg | RECTAL | Status: DC | PRN
Start: 2017-07-26 — End: 2017-07-27

## 2017-07-26 MED ORDER — BISACODYL 10 MG RE SUPP: 10.0000 mg | Freq: Every day | RECTAL | Status: DC | PRN

## 2017-07-26 MED ORDER — ALUM & MAG HYDROXIDE-SIMETH 200-200-20 MG/5ML PO SUSP: 30.0000 mL | Freq: Four times a day (QID) | ORAL | Status: DC | PRN

## 2017-07-26 MED ORDER — ZOLPIDEM TARTRATE 5 MG PO TABS
5.0000 mg | ORAL_TABLET | Freq: Every evening | ORAL | Status: DC | PRN
Start: 2017-07-26 — End: 2017-07-27

## 2017-07-26 MED ORDER — THROMBIN 5000 UNITS EX SOLR
CUTANEOUS | Status: AC
  Filled 2017-07-26: qty 5000

## 2017-07-26 MED ORDER — GABAPENTIN 100 MG PO CAPS: 100.0000 mg | ORAL_CAPSULE | ORAL | Status: DC

## 2017-07-26 MED ORDER — FAMOTIDINE IN NACL 20-0.9 MG/50ML-% IV SOLN: 20.0000 mg | Freq: Two times a day (BID) | INTRAVENOUS | Status: DC

## 2017-07-26 MED ORDER — ONDANSETRON HCL 4 MG/2ML IJ SOLN
INTRAMUSCULAR | Status: AC
  Filled 2017-07-26: qty 2

## 2017-07-26 MED ORDER — DOCUSATE SODIUM 100 MG PO CAPS
100.0000 mg | ORAL_CAPSULE | Freq: Two times a day (BID) | ORAL | Status: DC
  Administered 2017-07-26: 100 mg via ORAL
  Filled 2017-07-26: qty 1

## 2017-07-26 MED ORDER — PROPOFOL 10 MG/ML IV BOLUS
INTRAVENOUS | Status: DC | PRN
  Administered 2017-07-26: 150 mg via INTRAVENOUS

## 2017-07-26 MED ORDER — CHLORHEXIDINE GLUCONATE CLOTH 2 % EX PADS: 6.0000 | MEDICATED_PAD | Freq: Once | CUTANEOUS | Status: DC

## 2017-07-26 MED ORDER — GABAPENTIN 400 MG PO CAPS
400.0000 mg | ORAL_CAPSULE | Freq: Every day | ORAL | Status: DC
  Administered 2017-07-26: 400 mg via ORAL
  Filled 2017-07-26: qty 1

## 2017-07-26 MED ORDER — PROSTAGLANDIN E1 POWD: 1.0000 mL | Status: DC | PRN

## 2017-07-26 MED ORDER — FENTANYL CITRATE (PF) 250 MCG/5ML IJ SOLN
INTRAMUSCULAR | Status: AC
  Filled 2017-07-26: qty 5

## 2017-07-26 MED ORDER — BUDESONIDE 0.25 MG/2ML IN SUSP
0.2500 mg | Freq: Two times a day (BID) | RESPIRATORY_TRACT | Status: DC
  Filled 2017-07-26 (×2): qty 2

## 2017-07-26 MED ORDER — MEPERIDINE HCL 50 MG/ML IJ SOLN: 6.2500 mg | INTRAMUSCULAR | Status: DC | PRN

## 2017-07-26 MED ORDER — PROPOFOL 10 MG/ML IV BOLUS
INTRAVENOUS | Status: AC
  Filled 2017-07-26: qty 20

## 2017-07-26 MED ORDER — ACETAMINOPHEN 325 MG PO TABS
650.0000 mg | ORAL_TABLET | ORAL | Status: DC | PRN
  Administered 2017-07-27: 650 mg via ORAL
  Filled 2017-07-26: qty 2

## 2017-07-26 MED ORDER — CEFAZOLIN SODIUM-DEXTROSE 2-4 GM/100ML-% IV SOLN
2.0000 g | Freq: Three times a day (TID) | INTRAVENOUS | Status: AC
  Administered 2017-07-26 – 2017-07-27 (×2): 2 g via INTRAVENOUS
  Filled 2017-07-26 (×3): qty 100

## 2017-07-26 MED ORDER — VITAMIN C 500 MG PO TABS
500.0000 mg | ORAL_TABLET | Freq: Every day | ORAL | Status: DC
  Administered 2017-07-26: 500 mg via ORAL
  Filled 2017-07-26: qty 1

## 2017-07-26 MED ORDER — ONDANSETRON HCL 4 MG/2ML IJ SOLN: 4.0000 mg | Freq: Four times a day (QID) | INTRAMUSCULAR | Status: DC | PRN

## 2017-07-26 MED ORDER — 0.9 % SODIUM CHLORIDE (POUR BTL) OPTIME
TOPICAL | Status: DC | PRN
  Administered 2017-07-26: 1000 mL

## 2017-07-26 MED ORDER — FLECAINIDE ACETATE 50 MG PO TABS
75.0000 mg | ORAL_TABLET | Freq: Two times a day (BID) | ORAL | Status: DC
  Administered 2017-07-26: 75 mg via ORAL
  Filled 2017-07-26 (×2): qty 2

## 2017-07-26 MED ORDER — VITAMIN D 1000 UNITS PO TABS
1000.0000 [IU] | ORAL_TABLET | Freq: Every day | ORAL | Status: DC
  Administered 2017-07-26: 1000 [IU] via ORAL
  Filled 2017-07-26: qty 1

## 2017-07-26 MED ORDER — OXYCODONE HCL 5 MG PO TABS: 10.0000 mg | ORAL_TABLET | ORAL | Status: DC | PRN

## 2017-07-26 MED ORDER — KCL IN DEXTROSE-NACL 20-5-0.45 MEQ/L-%-% IV SOLN: INTRAVENOUS | Status: DC

## 2017-07-26 MED ORDER — CEFAZOLIN SODIUM-DEXTROSE 2-4 GM/100ML-% IV SOLN
INTRAVENOUS | Status: AC
  Filled 2017-07-26: qty 100

## 2017-07-26 MED ORDER — BUPIVACAINE HCL (PF) 0.5 % IJ SOLN
INTRAMUSCULAR | Status: AC
  Filled 2017-07-26: qty 30

## 2017-07-26 MED ORDER — DEXAMETHASONE SODIUM PHOSPHATE 10 MG/ML IJ SOLN
INTRAMUSCULAR | Status: DC | PRN
  Administered 2017-07-26: 10 mg via INTRAVENOUS

## 2017-07-26 MED ORDER — CEFAZOLIN SODIUM-DEXTROSE 2-4 GM/100ML-% IV SOLN
2.0000 g | INTRAVENOUS | Status: AC
  Administered 2017-07-26: 2 g via INTRAVENOUS

## 2017-07-26 MED ORDER — NEOSTIGMINE METHYLSULFATE 10 MG/10ML IV SOLN
INTRAVENOUS | Status: DC | PRN
  Administered 2017-07-26: 4 mg via INTRAVENOUS

## 2017-07-26 MED ORDER — LACTATED RINGERS IV SOLN
INTRAVENOUS | Status: DC
  Administered 2017-07-26: 13:00:00 via INTRAVENOUS

## 2017-07-26 MED ORDER — ROCURONIUM BROMIDE 10 MG/ML (PF) SYRINGE
PREFILLED_SYRINGE | INTRAVENOUS | Status: DC | PRN
  Administered 2017-07-26: 50 mg via INTRAVENOUS

## 2017-07-26 MED ORDER — ASPIRIN EC 81 MG PO TBEC
81.0000 mg | DELAYED_RELEASE_TABLET | Freq: Every day | ORAL | Status: DC
  Administered 2017-07-26: 81 mg via ORAL
  Filled 2017-07-26: qty 1

## 2017-07-26 MED ORDER — FAMOTIDINE 20 MG PO TABS
20.0000 mg | ORAL_TABLET | Freq: Two times a day (BID) | ORAL | Status: DC
  Administered 2017-07-26: 20 mg via ORAL
  Filled 2017-07-26: qty 1

## 2017-07-26 MED ORDER — L-LYSINE 500 MG PO TABS: 500.0000 mg | ORAL_TABLET | Freq: Every day | ORAL | Status: DC

## 2017-07-26 MED ORDER — ALBUTEROL SULFATE (2.5 MG/3ML) 0.083% IN NEBU: 2.5000 mg | INHALATION_SOLUTION | Freq: Four times a day (QID) | RESPIRATORY_TRACT | Status: DC | PRN

## 2017-07-26 MED ORDER — LIDOCAINE-EPINEPHRINE 1 %-1:100000 IJ SOLN
INTRAMUSCULAR | Status: DC | PRN
  Administered 2017-07-26: 5 mL

## 2017-07-26 MED ORDER — DILTIAZEM HCL 30 MG PO TABS: 30.0000 mg | ORAL_TABLET | ORAL | Status: DC | PRN

## 2017-07-26 MED ORDER — METHYLPREDNISOLONE ACETATE 80 MG/ML IJ SUSP
INTRAMUSCULAR | Status: DC | PRN
  Administered 2017-07-26: 80 mg

## 2017-07-26 MED ORDER — MORPHINE SULFATE (PF) 4 MG/ML IV SOLN: 2.0000 mg | INTRAVENOUS | Status: DC | PRN

## 2017-07-26 MED ORDER — METHOCARBAMOL 500 MG PO TABS
ORAL_TABLET | ORAL | Status: AC
  Filled 2017-07-26: qty 1

## 2017-07-26 MED ORDER — THROMBIN (RECOMBINANT) 5000 UNITS EX SOLR
OROMUCOSAL | Status: DC | PRN
  Administered 2017-07-26: 16:00:00 via TOPICAL

## 2017-07-26 MED ORDER — ZOLPIDEM TARTRATE 5 MG PO TABS: 5.0000 mg | ORAL_TABLET | Freq: Every evening | ORAL | Status: DC | PRN

## 2017-07-26 MED ORDER — PANTOPRAZOLE SODIUM 40 MG PO TBEC
40.0000 mg | DELAYED_RELEASE_TABLET | Freq: Every day | ORAL | Status: DC
Start: 2017-07-27 — End: 2017-07-27
  Filled 2017-07-26: qty 1

## 2017-07-26 MED ORDER — FENTANYL CITRATE (PF) 250 MCG/5ML IJ SOLN
INTRAMUSCULAR | Status: DC | PRN
  Administered 2017-07-26: 100 ug via INTRAVENOUS
  Administered 2017-07-26: 50 ug via INTRAVENOUS

## 2017-07-26 MED ORDER — ADULT MULTIVITAMIN W/MINERALS CH
1.0000 | ORAL_TABLET | Freq: Every day | ORAL | Status: DC
  Administered 2017-07-26: 1 via ORAL
  Filled 2017-07-26: qty 1

## 2017-07-26 MED ORDER — OXYCODONE HCL 5 MG PO TABS
5.0000 mg | ORAL_TABLET | ORAL | Status: DC | PRN
  Administered 2017-07-26 – 2017-07-27 (×2): 5 mg via ORAL
  Filled 2017-07-26 (×3): qty 1

## 2017-07-26 MED ORDER — MONTELUKAST SODIUM 10 MG PO TABS
10.0000 mg | ORAL_TABLET | Freq: Every day | ORAL | Status: DC
  Administered 2017-07-26: 10 mg via ORAL
  Filled 2017-07-26: qty 1

## 2017-07-26 MED ORDER — ACETAMINOPHEN 500 MG PO TABS: 500.0000 mg | ORAL_TABLET | Freq: Four times a day (QID) | ORAL | Status: DC | PRN

## 2017-07-26 MED ORDER — SODIUM CHLORIDE 0.9% FLUSH
3.0000 mL | Freq: Two times a day (BID) | INTRAVENOUS | Status: DC
  Administered 2017-07-26: 3 mL via INTRAVENOUS

## 2017-07-26 MED ORDER — GLYCOPYRROLATE 0.2 MG/ML IJ SOLN
INTRAMUSCULAR | Status: DC | PRN
  Administered 2017-07-26: 0.6 mg via INTRAVENOUS

## 2017-07-26 MED ORDER — FLEET ENEMA 7-19 GM/118ML RE ENEM: 1.0000 | ENEMA | Freq: Once | RECTAL | Status: DC | PRN

## 2017-07-26 MED ORDER — ALIGN 4 MG PO CAPS: 4.0000 mg | ORAL_CAPSULE | Freq: Every day | ORAL | Status: DC

## 2017-07-26 MED ORDER — ONDANSETRON HCL 4 MG PO TABS: 4.0000 mg | ORAL_TABLET | Freq: Four times a day (QID) | ORAL | Status: DC | PRN

## 2017-07-26 MED ORDER — NAPHAZOLINE-GLYCERIN 0.012-0.2 % OP SOLN: Freq: Four times a day (QID) | OPHTHALMIC | Status: DC | PRN

## 2017-07-26 MED ORDER — ATORVASTATIN CALCIUM 20 MG PO TABS
20.0000 mg | ORAL_TABLET | Freq: Every day | ORAL | Status: DC
  Administered 2017-07-26: 20 mg via ORAL
  Filled 2017-07-26: qty 1

## 2017-07-26 MED ORDER — DILTIAZEM HCL ER COATED BEADS 120 MG PO CP24
120.0000 mg | ORAL_CAPSULE | Freq: Every day | ORAL | Status: DC
  Administered 2017-07-26: 120 mg via ORAL
  Filled 2017-07-26: qty 1

## 2017-07-26 MED ORDER — METHYLPREDNISOLONE ACETATE 80 MG/ML IJ SUSP
INTRAMUSCULAR | Status: AC
  Filled 2017-07-26: qty 1

## 2017-07-26 MED ORDER — PHENOL 1.4 % MT LIQD: 1.0000 | OROMUCOSAL | Status: DC | PRN

## 2017-07-26 MED ORDER — HYDROCODONE-ACETAMINOPHEN 5-325 MG PO TABS
1.0000 | ORAL_TABLET | Freq: Four times a day (QID) | ORAL | Status: DC | PRN
  Administered 2017-07-26 – 2017-07-27 (×2): 1 via ORAL
  Filled 2017-07-26 (×2): qty 1

## 2017-07-26 SURGICAL SUPPLY — 54 items
ADH SKN CLS APL DERMABOND .7 (GAUZE/BANDAGES/DRESSINGS) ×1
BLADE CLIPPER SURG (BLADE) IMPLANT
BUR MATCHSTICK NEURO 3.0 LAGG (BURR) ×2 IMPLANT
BUR ROUND FLUTED 5 RND (BURR) ×2 IMPLANT
CANISTER SUCT 3000ML PPV (MISCELLANEOUS) ×2 IMPLANT
CARTRIDGE OIL MAESTRO DRILL (MISCELLANEOUS) ×1 IMPLANT
DECANTER SPIKE VIAL GLASS SM (MISCELLANEOUS) ×2 IMPLANT
DERMABOND ADVANCED (GAUZE/BANDAGES/DRESSINGS) ×1
DERMABOND ADVANCED .7 DNX12 (GAUZE/BANDAGES/DRESSINGS) ×1 IMPLANT
DIFFUSER DRILL AIR PNEUMATIC (MISCELLANEOUS) ×2 IMPLANT
DRAPE LAPAROTOMY 100X72X124 (DRAPES) ×2 IMPLANT
DRAPE MICROSCOPE LEICA (MISCELLANEOUS) ×2 IMPLANT
DRAPE SURG 17X23 STRL (DRAPES) ×2 IMPLANT
DRSG OPSITE POSTOP 3X4 (GAUZE/BANDAGES/DRESSINGS) ×1 IMPLANT
DURAPREP 26ML APPLICATOR (WOUND CARE) ×2 IMPLANT
ELECT REM PT RETURN 9FT ADLT (ELECTROSURGICAL) ×2
ELECTRODE REM PT RTRN 9FT ADLT (ELECTROSURGICAL) ×1 IMPLANT
GAUZE SPONGE 4X4 12PLY STRL (GAUZE/BANDAGES/DRESSINGS) IMPLANT
GAUZE SPONGE 4X4 16PLY XRAY LF (GAUZE/BANDAGES/DRESSINGS) IMPLANT
GLOVE BIO SURGEON STRL SZ8 (GLOVE) ×2 IMPLANT
GLOVE BIOGEL PI IND STRL 8 (GLOVE) ×1 IMPLANT
GLOVE BIOGEL PI IND STRL 8.5 (GLOVE) ×1 IMPLANT
GLOVE BIOGEL PI INDICATOR 8 (GLOVE) ×1
GLOVE BIOGEL PI INDICATOR 8.5 (GLOVE) ×1
GLOVE ECLIPSE 8.0 STRL XLNG CF (GLOVE) ×2 IMPLANT
GLOVE EXAM NITRILE LRG STRL (GLOVE) IMPLANT
GLOVE EXAM NITRILE XL STR (GLOVE) IMPLANT
GLOVE EXAM NITRILE XS STR PU (GLOVE) IMPLANT
GOWN STRL REUS W/ TWL LRG LVL3 (GOWN DISPOSABLE) IMPLANT
GOWN STRL REUS W/ TWL XL LVL3 (GOWN DISPOSABLE) ×1 IMPLANT
GOWN STRL REUS W/TWL 2XL LVL3 (GOWN DISPOSABLE) ×2 IMPLANT
GOWN STRL REUS W/TWL LRG LVL3 (GOWN DISPOSABLE)
GOWN STRL REUS W/TWL XL LVL3 (GOWN DISPOSABLE) ×2
HEMOSTAT POWDER KIT SURGIFOAM (HEMOSTASIS) ×2 IMPLANT
KIT BASIN OR (CUSTOM PROCEDURE TRAY) ×2 IMPLANT
KIT TURNOVER KIT B (KITS) ×2 IMPLANT
NDL HYPO 18GX1.5 BLUNT FILL (NEEDLE) IMPLANT
NDL HYPO 25X1 1.5 SAFETY (NEEDLE) ×1 IMPLANT
NEEDLE HYPO 18GX1.5 BLUNT FILL (NEEDLE) IMPLANT
NEEDLE HYPO 25X1 1.5 SAFETY (NEEDLE) ×2 IMPLANT
NS IRRIG 1000ML POUR BTL (IV SOLUTION) ×2 IMPLANT
OIL CARTRIDGE MAESTRO DRILL (MISCELLANEOUS) ×2
PACK LAMINECTOMY NEURO (CUSTOM PROCEDURE TRAY) ×2 IMPLANT
PAD ARMBOARD 7.5X6 YLW CONV (MISCELLANEOUS) ×6 IMPLANT
RUBBERBAND STERILE (MISCELLANEOUS) ×4 IMPLANT
SPONGE SURGIFOAM ABS GEL SZ50 (HEMOSTASIS) IMPLANT
SUT VIC AB 0 CT1 18XCR BRD8 (SUTURE) ×1 IMPLANT
SUT VIC AB 0 CT1 8-18 (SUTURE) ×2
SUT VIC AB 2-0 CT1 18 (SUTURE) ×2 IMPLANT
SUT VIC AB 3-0 SH 8-18 (SUTURE) ×2 IMPLANT
SYR 5ML LL (SYRINGE) IMPLANT
TOWEL GREEN STERILE (TOWEL DISPOSABLE) ×2 IMPLANT
TOWEL GREEN STERILE FF (TOWEL DISPOSABLE) ×2 IMPLANT
WATER STERILE IRR 1000ML POUR (IV SOLUTION) ×2 IMPLANT

## 2017-07-26 NOTE — Progress Notes (Signed)
Awake, alert, conversant.  Back is sore.  Full strength both legs.  Doing well.

## 2017-07-26 NOTE — Interval H&P Note (Signed)
History and Physical Interval Note:  07/26/2017 1:52 PM  Brandon Simmons Section.  has presented today for surgery, with the diagnosis of DEGENERATIVE LUMBAR SPINAL STENOSIS  The various methods of treatment have been discussed with the patient and family. After consideration of risks, benefits and other options for treatment, the patient has consented to  Procedure(s) with comments: RIGHT LUMBAR 3- LUMBAR 4 FORAMINOTOMY WITH RESECTION OF SYNOVIAL CYST (Right) - RIGHT LUMBAR 3- LUMBAR 4 FORAMINOTOMY WITH RESECTION OF SYNOVIAL CYST as a surgical intervention .  The patient's history has been reviewed, patient examined, no change in status, stable for surgery.  I have reviewed the patient's chart and labs.  Questions were answered to the patient's satisfaction.     Dawn Convery D

## 2017-07-26 NOTE — Plan of Care (Signed)
  Problem: Activity: Goal: Ability to avoid complications of mobility impairment will improve Outcome: Progressing Goal: Ability to tolerate increased activity will improve Outcome: Progressing Goal: Will remain free from falls Outcome: Progressing   Problem: Bowel/Gastric: Goal: Gastrointestinal status for postoperative course will improve Outcome: Progressing   Problem: Education: Goal: Ability to verbalize activity precautions or restrictions will improve Outcome: Progressing Goal: Knowledge of the prescribed therapeutic regimen will improve Outcome: Progressing Goal: Understanding of discharge needs will improve Outcome: Progressing   Problem: Physical Regulation: Goal: Ability to maintain clinical measurements within normal limits will improve Outcome: Progressing Goal: Postoperative complications will be avoided or minimized Outcome: Progressing Goal: Diagnostic test results will improve Outcome: Progressing   Problem: Pain Management: Goal: Pain level will decrease Outcome: Progressing   Problem: Skin Integrity: Goal: Signs of wound healing will improve Outcome: Progressing   Problem: Health Behavior/Discharge Planning: Goal: Identification of resources available to assist in meeting health care needs will improve Outcome: Progressing   Problem: Bladder/Genitourinary: Goal: Urinary functional status for postoperative course will improve Outcome: Progressing   Problem: Education: Goal: Knowledge of General Education information will improve Outcome: Progressing   Problem: Health Behavior/Discharge Planning: Goal: Ability to manage health-related needs will improve Outcome: Progressing   Problem: Clinical Measurements: Goal: Ability to maintain clinical measurements within normal limits will improve Outcome: Progressing Goal: Will remain free from infection Outcome: Progressing Goal: Diagnostic test results will improve Outcome: Progressing Goal:  Respiratory complications will improve Outcome: Progressing Goal: Cardiovascular complication will be avoided Outcome: Progressing   Problem: Activity: Goal: Risk for activity intolerance will decrease Outcome: Progressing   Problem: Nutrition: Goal: Adequate nutrition will be maintained Outcome: Progressing   Problem: Coping: Goal: Level of anxiety will decrease Outcome: Progressing   Problem: Elimination: Goal: Will not experience complications related to bowel motility Outcome: Progressing Goal: Will not experience complications related to urinary retention Outcome: Progressing   Problem: Pain Managment: Goal: General experience of comfort will improve Outcome: Progressing   Problem: Safety: Goal: Ability to remain free from injury will improve Outcome: Progressing   Problem: Skin Integrity: Goal: Risk for impaired skin integrity will decrease Outcome: Progressing

## 2017-07-26 NOTE — Brief Op Note (Signed)
07/26/2017  3:40 PM  PATIENT:  Brandon Simmons.  72 y.o. 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 Right  PROCEDURE:  Procedure(s) with comments: RIGHT LUMBAR 3- LUMBAR 4 FORAMINOTOMY WITH RESECTION OF SYNOVIAL CYST (Right) - RIGHT LUMBAR 3- LUMBAR 4 FORAMINOTOMY WITH RESECTION OF SYNOVIAL CYST with microdissection  SURGEON:  Surgeon(s) and Role:    Erline Levine, MD - Primary  PHYSICIAN ASSISTANT:   ASSISTANTS: Poteat, RN   ANESTHESIA:   general  EBL:  40 mL   BLOOD ADMINISTERED:none  DRAINS: none   LOCAL MEDICATIONS USED:  MARCAINE    and LIDOCAINE   SPECIMEN:  No Specimen  DISPOSITION OF SPECIMEN:  N/A  COUNTS:  YES  TOURNIQUET:  * No tourniquets in log *  DICTATION:  DICTATION: Patient has a synovial cyst at L 34  on the right with Grade I spondylolisthesis L 34. It was elected to take him to surgery for right L 34 resection of synovial cyst.  Procedure: Patient was brought to the operating room and following the smooth and uncomplicated induction of general endotracheal anesthesia he was placed in a prone position on the Wilson frame.  Low back was prepped and draped in the usual sterile fashion with betadine scrub and DuraPrep. Preoperative localizing radiograph was obtained with a spinal needle .  Area of planned incision was infiltrated with local lidocaine. Incision was made in the midline and carried to the lumbodorsal fascia which was incised on the right side of midline. Subperiosteal dissection was performed exposing what was felt to be L 34 level. Intraoperative x-ray demonstrated marker probe at L 34 level.  A laminotomy of L 3 was performed as well as a foraminotomy overlying L 4.Ligamentum was mobilized and detached, exposing the common dural tube and L 4 nerve root.  Using  microdissection, the neural elements were mobilized medially. The microscope was brought into the field and the L4 nerve root was mobilized medially and carefully dissected from the synovial cyst which was adherent to the dura.  Using microdissection, the cyst was removed and neural elements thoroughly decompressed. There was no violation of the dura. Foraminotomy was carried cephalad and distal with decompression of all neural elements.    Hemostasis was assured with bipolar electrocautery and the interspace was irrigated with Depo-Medrol and fentanyl. The lumbodorsal fascia was closed with 0 Vicryl sutures the subcutaneous tissues reapproximated 2-0 Vicryl inverted sutures and the skin edges were reapproximated with 3-0 Vicryl subcuticular stitch. The wound is dressed with Dermabond and an occlusive dressing. Patient was extubated in the operating room and taken to recovery in stable and satisfactory condition having tolerated his operation well counts were correct at the end of the case.  PLAN OF CARE: Admit to inpatient   PATIENT DISPOSITION:  PACU - hemodynamically stable.   Delay start of Pharmacological VTE agent (>24hrs) due to surgical blood loss or risk of bleeding: yes

## 2017-07-26 NOTE — Op Note (Signed)
07/26/2017  3:40 PM  PATIENT:  Brandon Simmons.  72 y.o. 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 Right  PROCEDURE:  Procedure(s) with comments: RIGHT LUMBAR 3- LUMBAR 4 FORAMINOTOMY WITH RESECTION OF SYNOVIAL CYST (Right) - RIGHT LUMBAR 3- LUMBAR 4 FORAMINOTOMY WITH RESECTION OF SYNOVIAL CYST with microdissection  SURGEON:  Surgeon(s) and Role:    Erline Levine, MD - Primary  PHYSICIAN ASSISTANT:   ASSISTANTS: Poteat, RN   ANESTHESIA:   general  EBL:  40 mL   BLOOD ADMINISTERED:none  DRAINS: none   LOCAL MEDICATIONS USED:  MARCAINE    and LIDOCAINE   SPECIMEN:  No Specimen  DISPOSITION OF SPECIMEN:  N/A  COUNTS:  YES  TOURNIQUET:  * No tourniquets in log *  DICTATION:  DICTATION: Patient has a synovial cyst at L 34  on the right with Grade I spondylolisthesis L 34. It was elected to take him to surgery for right L 34 resection of synovial cyst.  Procedure: Patient was brought to the operating room and following the smooth and uncomplicated induction of general endotracheal anesthesia he was placed in a prone position on the Wilson frame.  Low back was prepped and draped in the usual sterile fashion with betadine scrub and DuraPrep. Preoperative localizing radiograph was obtained with a spinal needle .  Area of planned incision was infiltrated with local lidocaine. Incision was made in the midline and carried to the lumbodorsal fascia which was incised on the right side of midline. Subperiosteal dissection was performed exposing what was felt to be L 34 level. Intraoperative x-ray demonstrated marker probe at L 34 level.  A laminotomy of L 3 was performed as well as a foraminotomy overlying L 4.Ligamentum was mobilized and detached, exposing the common dural tube and L 4 nerve root.  Using  microdissection, the neural elements were mobilized medially. The microscope was brought into the field and the L4 nerve root was mobilized medially and carefully dissected from the synovial cyst which was adherent to the dura.  Using microdissection, the cyst was removed and neural elements thoroughly decompressed. There was no violation of the dura. Foraminotomy was carried cephalad and distal with decompression of all neural elements.    Hemostasis was assured with bipolar electrocautery and the interspace was irrigated with Depo-Medrol and fentanyl. The lumbodorsal fascia was closed with 0 Vicryl sutures the subcutaneous tissues reapproximated 2-0 Vicryl inverted sutures and the skin edges were reapproximated with 3-0 Vicryl subcuticular stitch. The wound is dressed with Dermabond and an occlusive dressing. Patient was extubated in the operating room and taken to recovery in stable and satisfactory condition having tolerated his operation well counts were correct at the end of the case.  PLAN OF CARE: Admit to inpatient   PATIENT DISPOSITION:  PACU - hemodynamically stable.   Delay start of Pharmacological VTE agent (>24hrs) due to surgical blood loss or risk of bleeding: yes

## 2017-07-26 NOTE — Transfer of Care (Signed)
Immediate Anesthesia Transfer of Care Note  Patient: Brandon Simmons.  Procedure(s) Performed: RIGHT LUMBAR 3- LUMBAR 4 FORAMINOTOMY WITH RESECTION OF SYNOVIAL CYST (Right Back)  Patient Location: PACU  Anesthesia Type:General  Level of Consciousness: awake, alert , oriented and patient cooperative  Airway & Oxygen Therapy: Patient Spontanous Breathing and Patient connected to nasal cannula oxygen  Post-op Assessment: Report given to RN and Post -op Vital signs reviewed and stable  Post vital signs: Reviewed and stable  Last Vitals:  Vitals Value Taken Time  BP 123/68 07/26/2017  3:40 PM  Temp    Pulse 63 07/26/2017  3:41 PM  Resp 12 07/26/2017  3:41 PM  SpO2 100 % 07/26/2017  3:41 PM  Vitals shown include unvalidated device data.  Last Pain:  Vitals:   07/26/17 1248  TempSrc:   PainSc: 0-No pain         Complications: No apparent anesthesia complications

## 2017-07-26 NOTE — Anesthesia Procedure Notes (Signed)
Procedure Name: Intubation Date/Time: 07/26/2017 2:16 PM Performed by: Myna Bright, CRNA Pre-anesthesia Checklist: Patient identified, Suction available, Emergency Drugs available and Patient being monitored Patient Re-evaluated:Patient Re-evaluated prior to induction Oxygen Delivery Method: Circle system utilized Preoxygenation: Pre-oxygenation with 100% oxygen Induction Type: IV induction Ventilation: Mask ventilation without difficulty Laryngoscope Size: Mac and 4 Grade View: Grade I Tube type: Oral Tube size: 7.5 mm Number of attempts: 1 Airway Equipment and Method: Stylet Placement Confirmation: ETT inserted through vocal cords under direct vision,  positive ETCO2 and breath sounds checked- equal and bilateral Secured at: 22 cm Tube secured with: Tape Dental Injury: Teeth and Oropharynx as per pre-operative assessment

## 2017-07-26 NOTE — Anesthesia Preprocedure Evaluation (Addendum)
Anesthesia Evaluation  Patient identified by MRN, date of birth, ID band Patient awake    Reviewed: Allergy & Precautions, H&P , NPO status , Patient's Chart, lab work & pertinent test results, reviewed documented beta blocker date and time   Airway Mallampati: II  TM Distance: >3 FB Neck ROM: full    Dental no notable dental hx.    Pulmonary neg pulmonary ROS, asthma , former smoker,    Pulmonary exam normal breath sounds clear to auscultation       Cardiovascular Exercise Tolerance: Good negative cardio ROS  + dysrhythmias Atrial Fibrillation  Rhythm:regular Rate:Normal  Echo 01/28/16:  - Left ventricle: The cavity size was normal. There was mild concentric hypertrophy. Systolic function was normal. The estimated ejection fraction was in the range of 60% to 65%. Wallmotion was normal; there were no regional wall motionabnormalities. Left ventricular diastolic function parameterswere normal. - Aortic valve: Transvalvular velocity was within the normal range. There was no stenosis. There was mild regurgitation. - Mitral valve: Transvalvular velocity was within the normal range. There was no evidence for stenosis. There was trivial regurgitation. - Left atrium: The atrium was mildly dilated. - Right ventricle: The cavity size was normal. Wall thickness was normal. Systolic function was normal. - Tricuspid valve: There was mild regurgitation. - Pulmonary arteries: Systolic pressure was within the normal range. PA peak pressure: 24 mm Hg (S).  Exercise tolerance test 02/08/15:   Blood pressure demonstrated a hypertensive response to exercise.  There was no ST segment deviation noted during stress.  Cardiac cath 01/05/15   1. LM normal 2. LAD: 30% mid stenosis 3. CX normal 4. RCA. 20% distal stenosis     Neuro/Psych negative neurological ROS  negative psych ROS   GI/Hepatic negative GI ROS, Neg liver ROS,    Endo/Other  negative endocrine ROS  Renal/GU negative Renal ROS  negative genitourinary   Musculoskeletal  (+) Arthritis , Osteoarthritis,    Abdominal   Peds  Hematology negative hematology ROS (+)   Anesthesia Other Findings   Reproductive/Obstetrics negative OB ROS                            Anesthesia Physical Anesthesia Plan  ASA: III  Anesthesia Plan: General   Post-op Pain Management:    Induction: Intravenous  PONV Risk Score and Plan: 2 and Ondansetron  Airway Management Planned: Oral ETT  Additional Equipment:   Intra-op Plan:   Post-operative Plan: Extubation in OR  Informed Consent: I have reviewed the patients History and Physical, chart, labs and discussed the procedure including the risks, benefits and alternatives for the proposed anesthesia with the patient or authorized representative who has indicated his/her understanding and acceptance.   Dental Advisory Given  Plan Discussed with: CRNA, Anesthesiologist and Surgeon  Anesthesia Plan Comments: (  )        Anesthesia Quick Evaluation

## 2017-07-26 NOTE — Progress Notes (Signed)
PHARMACIST - PHYSICIAN ORDER COMMUNICATION  CONCERNING: P&T Medication Policy on Herbal Medications  DESCRIPTION:  This patient's order for:  L-Lysine 500 mg  has been noted.  This product(s) is classified as an "herbal" or natural product. Due to a lack of definitive safety studies or FDA approval, nonstandard manufacturing practices, plus the potential risk of unknown drug-drug interactions while on inpatient medications, the Pharmacy and Therapeutics Committee does not permit the use of "herbal" or natural products of this type within [facility].   ACTION TAKEN: The pharmacy department is unable to verify this order at this time and your patient has been informed of this safety policy. Please reevaluate patient's clinical condition at discharge and address if the herbal or natural product(s) should be resumed at that time.

## 2017-07-26 NOTE — Anesthesia Postprocedure Evaluation (Signed)
Anesthesia Post Note  Patient: Brandon Simmons.  Procedure(s) Performed: RIGHT LUMBAR 3- LUMBAR 4 FORAMINOTOMY WITH RESECTION OF SYNOVIAL CYST (Right Back)     Patient location during evaluation: PACU Anesthesia Type: General Level of consciousness: awake and alert Pain management: pain level controlled Vital Signs Assessment: post-procedure vital signs reviewed and stable Respiratory status: spontaneous breathing, nonlabored ventilation, respiratory function stable and patient connected to nasal cannula oxygen Cardiovascular status: blood pressure returned to baseline and stable Postop Assessment: no apparent nausea or vomiting Anesthetic complications: no    Last Vitals:  Vitals:   07/26/17 1625 07/26/17 1640  BP: 139/74 133/69  Pulse: 63 65  Resp: 15 20  Temp:    SpO2: 100% 99%    Last Pain:  Vitals:   07/26/17 1540  TempSrc:   PainSc: 6                  Papa Piercefield

## 2017-07-27 ENCOUNTER — Encounter (HOSPITAL_COMMUNITY): Payer: Self-pay | Admitting: Neurosurgery

## 2017-07-27 MED ORDER — HYDROCODONE-ACETAMINOPHEN 5-325 MG PO TABS: 1.0000 | ORAL_TABLET | Freq: Four times a day (QID) | ORAL | 0 refills | Status: DC | PRN

## 2017-07-27 MED ORDER — METHOCARBAMOL 500 MG PO TABS: 500.0000 mg | ORAL_TABLET | Freq: Four times a day (QID) | ORAL | 1 refills | Status: DC | PRN

## 2017-07-27 MED FILL — Thrombin For Soln 5000 Unit: CUTANEOUS | Qty: 5000 | Status: AC

## 2017-07-27 NOTE — Progress Notes (Addendum)
Subjective: Patient reports "My leg is not hurting at all"  Objective: Vital signs in last 24 hours: Temp:  [97.1 F (36.2 C)-98.2 F (36.8 C)] 97.8 F (36.6 C) (05/10 0425) Pulse Rate:  [55-81] 70 (05/10 0425) Resp:  [10-20] 18 (05/10 0425) BP: (116-143)/(61-84) 116/61 (05/10 0425) SpO2:  [95 %-100 %] 97 % (05/10 0425) Weight:  [97.1 kg (214 lb)] 97.1 kg (214 lb) (05/09 1741)  Intake/Output from previous day: 05/09 0701 - 05/10 0700 In: 803 [I.V.:703; IV Piggyback:100] Out: 40 [Blood:40] Intake/Output this shift: No intake/output data recorded.  Alert, conversant, without complaint of pain at present. Wife at bedside. Full strength BLE. Incision without erythema, swelling, or drainage beneath honeycomb and Dermabond. Ambulated in hallway overnight.  Lab Results: No results for input(s): WBC, HGB, HCT, PLT in the last 72 hours. BMET No results for input(s): NA, K, CL, CO2, GLUCOSE, BUN, CREATININE, CALCIUM in the last 72 hours.  Studies/Results: Dg Lumbar Spine 2-3 Views  Result Date: 07/26/2017 CLINICAL DATA:  L3-4 foraminotomy. EXAM: LUMBAR SPINE - 2-3 VIEW COMPARISON:  Radiographs of May 07, 2017. MRI of December 25, 2016. FINDINGS: Three intraoperative cross-table lateral projections were obtained of the lumbar spine. The first image demonstrates surgical probe directed toward the posterior spinous process of L3. The second demonstrates surgical probe directed toward posterior interspinous space of L3-4. The third demonstrates surgical probe directed toward the posterior portion of L3-4 disc space. IMPRESSION: Surgical localization as described above. Electronically Signed   By: Marijo Conception, M.D.   On: 07/26/2017 16:56    Assessment/Plan: Improving  LOS: 1 day  Per DrStern, d/c IV, d/c to home. Pt verbalizes understanding of d/c instructions. He has f/u appt scheduled in office. Rx's for Norco 5/325 and Robaxin 500mg  will be eRx'ed to his pharmacy for prn home  use.   Verdis Prime 07/27/2017, 7:13 AM  Patient is doing well.  Discharge home.

## 2017-07-27 NOTE — Evaluation (Signed)
Occupational Therapy Evaluation Patient Details Name: Brandon Simmons. MRN: [id number] DOB: 1946/02/27 Today's Date: 07/27/2017    History of Present Illness 72 yo male RIGHT LUMBAR 3- LUMBAR 4 FORAMINOTOMY WITH RESECTION OF SYNOVIAL CYST with microdissection   Clinical Impression   Patient evaluated by Occupational Therapy with no further acute OT needs identified. All education has been completed and the patient has no further questions. See below for any follow-up Occupational Therapy or equipment needs. OT to sign off. Thank you for referral.      Follow Up Recommendations  No OT follow up    Equipment Recommendations  None recommended by OT    Recommendations for Other Services       Precautions / Restrictions Precautions Precautions: Back Precaution Comments: back handout provided and reviewed for adls Restrictions Weight Bearing Restrictions: No      Mobility Bed Mobility Overal bed mobility: Independent                Transfers Overall transfer level: Independent                    Balance                                           ADL either performed or assessed with clinical judgement   ADL Overall ADL's : Independent                                       General ADL Comments: able to dress for home, demonstrate bed mobility x2, shower transfer discussed and basic transfer demonstrated. Discussed elevated bed height  Back handout provided and reviewed adls in detail. Pt educated on:  avoid sitting for long periods of time, correct bed positioning for sleeping, correct sequence for bed mobility, avoiding lifting more than 5 pounds and never wash directly over incision. All education is complete and patient indicates understanding.     Vision Baseline Vision/History: Wears glasses Wears Glasses: At all times Patient Visual Report: No change from baseline       Perception     Praxis       Pertinent Vitals/Pain Pain Assessment: Faces Faces Pain Scale: Hurts a little bit Pain Location: back Pain Descriptors / Indicators: Operative site guarding Pain Intervention(s): Repositioned;Premedicated before session;Monitored during session     Hand Dominance Right   Extremity/Trunk Assessment Upper Extremity Assessment Upper Extremity Assessment: Overall WFL for tasks assessed   Lower Extremity Assessment Lower Extremity Assessment: Defer to PT evaluation   Cervical / Trunk Assessment Cervical / Trunk Assessment: Other exceptions Cervical / Trunk Exceptions: s/p surgery   Communication Communication Communication: No difficulties   Cognition Arousal/Alertness: Awake/alert Behavior During Therapy: WFL for tasks assessed/performed Overall Cognitive Status: Within Functional Limits for tasks assessed                                 General Comments: w   General Comments  dressing dry and intact. wife requesting RN to remove dressing prior to home    Exercises     Shoulder Instructions      Home Living Family/patient expects to be discharged to:: Private residence Living Arrangements: Spouse/significant other Available Help at Discharge: Family Type  of Home: House Home Access: Level entry     Home Layout: One level     Bathroom Shower/Tub: Occupational psychologist: Standard     Home Equipment: None   Additional Comments: plans to fly to [place] in 2.5 weeks      Prior Functioning/Environment Level of Independence: Independent                 OT Problem List:        OT Treatment/Interventions:      OT Goals(Current goals can be found in the care plan section) Acute Rehab OT Goals Patient Stated Goal: to fly to [place]  OT Frequency:     Barriers to D/C:            Co-evaluation              AM-PAC PT "6 Clicks" Daily Activity     Outcome Measure Help from another person eating meals?: None Help from  another person taking care of personal grooming?: None Help from another person toileting, which includes using toliet, bedpan, or urinal?: None Help from another person bathing (including washing, rinsing, drying)?: None Help from another person to put on and taking off regular upper body clothing?: None Help from another person to put on and taking off regular lower body clothing?: None 6 Click Score: 24   End of Session Equipment Utilized During Treatment: Gait belt Nurse Communication: Mobility status;Precautions  Activity Tolerance: Patient tolerated treatment well Patient left: in chair;with call bell/phone within reach;with family/visitor present  OT Visit Diagnosis: Unsteadiness on feet (R26.81)                Time: 3335-4562 OT Time Calculation (min): 26 min Charges:  OT General Charges $OT Visit: 1 Visit OT Evaluation $OT Eval Moderate Complexity: 1 Mod G-Codes:      Jeri Modena   OTR/L Pager: [phone] Office: [phone] .   Parke Poisson B 07/27/2017, 8:02 AM

## 2017-07-27 NOTE — Progress Notes (Signed)
Patient is discharged from room 3C02 at this time. Alert and in stable condition. IV site d/c'd and instructions read to patient and spouse with understanding verbalized. Left unit via wheelchair with all belongings at side 

## 2017-07-27 NOTE — Evaluation (Signed)
Physical Therapy Evaluation and Discharge Patient Details Name: Brandon Simmons. MRN: [id number] DOB: June 29, 1945 Today's Date: 07/27/2017   History of Present Illness  Pt is a 72 y/o male who presents s/p L3-L4 foraminotomy with resection of synovial cyst on 07/26/17.   Clinical Impression  Patient evaluated by Physical Therapy with no further acute PT needs identified. All education has been completed and the patient has no further questions. At the time of PT eval pt was able to perform transfers and ambulation with gross mod I to independence without an AD. Pt was educated on stair negotiation, car transfer, precautions, and activity progression. See below for any follow-up Physical Therapy or equipment needs. PT is signing off. Thank you for this referral.     Follow Up Recommendations No PT follow up;Supervision - Intermittent    Equipment Recommendations  None recommended by PT    Recommendations for Other Services       Precautions / Restrictions Precautions Precautions: Back Precaution Booklet Issued: Yes (comment) Precaution Comments: Reviewed in detail and pt was cued for precautions during functional mobility.  Restrictions Weight Bearing Restrictions: No      Mobility  Bed Mobility Overal bed mobility: Independent             General bed mobility comments: Practiced with bed height elevated to simulate home environment  Transfers Overall transfer level: Independent Equipment used: None             General transfer comment: No difficulty to transfer sit<>stand. Pt demonstrated proper hand placement on seated surface for safety.   Ambulation/Gait Ambulation/Gait assistance: Modified independent (Device/Increase time) Ambulation Distance (Feet): 400 Feet Assistive device: None Gait Pattern/deviations: WFL(Within Functional Limits)   Gait velocity interpretation: >2.62 ft/sec, indicative of community ambulatory General Gait Details: Slightly guarded  due to pain but overall ambulating well without difficulty   Stairs Stairs: Yes Stairs assistance: Modified independent (Device/Increase time) Stair Management: One rail Right;Step to pattern;Alternating pattern;Forwards Number of Stairs: 10 General stair comments: Pt demonstrated good technique with stair negotiation. No unsteadiness or LOB noted.   Wheelchair Mobility    Modified Rankin (Stroke Patients Only)       Balance Overall balance assessment: Modified Independent                                           Pertinent Vitals/Pain Pain Assessment: Faces Faces Pain Scale: Hurts a little bit Pain Location: back Pain Descriptors / Indicators: Operative site guarding Pain Intervention(s): Monitored during session    Home Living Family/patient expects to be discharged to:: Private residence Living Arrangements: Spouse/significant other Available Help at Discharge: Family Type of Home: House Home Access: Level entry     Home Layout: One level Home Equipment: None Additional Comments: plans to fly to [place] in 2.5 weeks    Prior Function Level of Independence: Independent               Hand Dominance   Dominant Hand: Right    Extremity/Trunk Assessment   Upper Extremity Assessment Upper Extremity Assessment: Overall WFL for tasks assessed    Lower Extremity Assessment Lower Extremity Assessment: Overall WFL for tasks assessed    Cervical / Trunk Assessment Cervical / Trunk Assessment: Other exceptions Cervical / Trunk Exceptions: s/p surgery  Communication   Communication: No difficulties  Cognition Arousal/Alertness: Awake/alert Behavior During Therapy: WFL for tasks assessed/performed  Overall Cognitive Status: Within Functional Limits for tasks assessed                                 General Comments: w      General Comments General comments (skin integrity, edema, etc.): dressing dry and intact. wife  requesting RN to remove dressing prior to home    Exercises     Assessment/Plan    PT Assessment Patent does not need any further PT services  PT Problem List         PT Treatment Interventions      PT Goals (Current goals can be found in the Care Plan section)  Acute Rehab PT Goals Patient Stated Goal: to fly to [place] PT Goal Formulation: All assessment and education complete, DC therapy    Frequency     Barriers to discharge        Co-evaluation               AM-PAC PT "6 Clicks" Daily Activity  Outcome Measure Difficulty turning over in bed (including adjusting bedclothes, sheets and blankets)?: None Difficulty moving from lying on back to sitting on the side of the bed? : None Difficulty sitting down on and standing up from a chair with arms (e.g., wheelchair, bedside commode, etc,.)?: None Help needed moving to and from a bed to chair (including a wheelchair)?: None Help needed walking in hospital room?: A Little Help needed climbing 3-5 steps with a railing? : A Little 6 Click Score: 22    End of Session Equipment Utilized During Treatment: Gait belt Activity Tolerance: Patient tolerated treatment well Patient left: in bed;with call bell/phone within reach;with family/visitor present Nurse Communication: Mobility status PT Visit Diagnosis: Pain;Other symptoms and signs involving the nervous system (R29.898) Pain - part of body: (back)    Time: 4166-0630 PT Time Calculation (min) (ACUTE ONLY): 15 min   Charges:   PT Evaluation $PT Eval Moderate Complexity: 1 Mod     PT G Codes:        Rolinda Roan, PT, DPT Acute Rehabilitation Services Pager: [phone]   Thelma Comp 07/27/2017, 9:29 AM

## 2017-07-27 NOTE — Discharge Summary (Signed)
Physician Discharge Summary  Patient ID: Brandon Simmons. MRN: [id number] DOB/AGE: [birth year] 72 y.o.  Admit date: 07/26/2017 Discharge date: 07/27/2017  Admission Diagnoses: DEGENERATIVE LUMBAR SPINAL STENOSIS, spondylolisthesis, synovial cyst L 34 Right    Discharge Diagnoses: DEGENERATIVE LUMBAR SPINAL STENOSIS, spondylolisthesis, synovial cyst L 34 Right s/p RIGHT LUMBAR 3- LUMBAR 4 FORAMINOTOMY WITH RESECTION OF SYNOVIAL CYST (Right) - RIGHT LUMBAR 3- LUMBAR 4 FORAMINOTOMY WITH RESECTION OF SYNOVIAL CYST with microdissection     Active Problems:   Synovial cyst of lumbar facet joint   Discharged Condition: good  Hospital Course: Brandon Simmons was admitted for surgery with dx spinal stenosis, synovial cyst, and radiculopathy. Following uncomplicated foraminotomy and resection of cyst, he recovered nicely and transferred to [facility] for nursing care. He has mobilized well. mobilized   Consults: None  Significant Diagnostic Studies: radiology: X-Ray: intra-op  Treatments: surgery: RIGHT LUMBAR 3- LUMBAR 4 FORAMINOTOMY WITH RESECTION OF SYNOVIAL CYST (Right) - RIGHT LUMBAR 3- LUMBAR 4 FORAMINOTOMY WITH RESECTION OF SYNOVIAL CYST with microdissection    Discharge Exam: Blood pressure 116/61, pulse 70, temperature 97.8 F (36.6 C), temperature source Oral, resp. rate 18, height 5\' 10"  (1.778 m), weight 97.1 kg (214 lb), SpO2 97 %. Alert, conversant, without complaint of pain at present. Wife at bedside. Full strength BLE. Incision without erythema, swelling, or drainage beneath honeycomb and Dermabond. Ambulated in hallway overnight.    Disposition: Discharge to home. Pt verbalizes understanding of d/c instructions. He has f/u appt scheduled in office. Rx's for Norco 5/325 and Robaxin 500mg  will be eRx'ed to his pharmacy for prn home use.       Allergies as of 07/27/2017   No Known Allergies     Medication List    TAKE these medications    acetaminophen 500 MG tablet Commonly known as:  TYLENOL Take 500 mg by mouth every 6 (six) hours as needed for mild pain.   albuterol 108 (90 Base) MCG/ACT inhaler Commonly known as:  PROVENTIL HFA;VENTOLIN HFA Inhale 2 puffs into the lungs every 6 (six) hours as needed for wheezing or shortness of breath.   ALIGN 4 MG Caps Take 4 mg by mouth daily.   ASMANEX HFA 100 MCG/ACT Aero Generic drug:  Mometasone Furoate Asmanex HFA 100 mcg/actuation aerosol inhaler   aspirin 81 MG tablet Take 81 mg by mouth daily.   atorvastatin 20 MG tablet Commonly known as:  LIPITOR Take 20 mg by mouth daily.   cholecalciferol 1000 units tablet Commonly known as:  VITAMIN D Take 1,000 Units by mouth daily.   diltiazem 120 MG 24 hr capsule Commonly known as:  CARDIZEM CD Take 1 capsule (120 mg total) by mouth daily.   diltiazem 30 MG tablet Commonly known as:  CARDIZEM Take 1 tablet every 4 hours AS NEEDED for afib rapid heart rate over 100 What changed:    how much to take  how to take this  when to take this  reasons to take this  additional instructions   flecainide 150 MG tablet Commonly known as:  TAMBOCOR Take 0.5 tablets (75 mg total) by mouth 2 (two) times daily.   fluticasone 50 MCG/ACT nasal spray Commonly known as:  FLONASE Place 1 spray into both nostrils daily.   gabapentin 100 MG capsule Commonly known as:  NEURONTIN Take 100-400 mg by mouth See admin instructions. Take 100 mg by mouth in the morning and take 400 mg by mouth at bedtime   GLUCOSAMINE 1500 COMPLEX Caps Take 1 capsule  by mouth daily.   HYDROcodone-acetaminophen 5-325 MG tablet Commonly known as:  NORCO/VICODIN Take 1-2 tablets by mouth every 6 (six) hours as needed for severe pain. What changed:  how much to take   L-Lysine 500 MG Tabs Take 500 mg by mouth daily.   methocarbamol 500 MG tablet Commonly known as:  ROBAXIN Take 1 tablet (500 mg total) by mouth every 6 (six) hours as needed  for muscle spasms.   montelukast 10 MG tablet Commonly known as:  SINGULAIR Take 10 mg by mouth at bedtime.   multivitamin capsule Take 1 capsule by mouth daily.   omeprazole 20 MG capsule Commonly known as:  PRILOSEC Take 20 mg by mouth 2 (two) times daily before a meal.   Prostaglandin E1 Powd 1 mL by Does not apply route as needed (FOR ERECTILE DYSFUNCTION).   psyllium 58.6 % powder Commonly known as:  METAMUCIL Take 1 packet by mouth 2 (two) times daily.   REDNESS RELIEF OP Place 2 drops into both eyes 4 (four) times daily as needed (for irritation).   vitamin C 500 MG tablet Commonly known as:  ASCORBIC ACID Take 500 mg by mouth daily.   zolpidem 10 MG tablet Commonly known as:  AMBIEN Take 10 mg by mouth at bedtime as needed for sleep.        Signed: Peggyann Shoals, MD 07/27/2017, 7:39 AM

## 2017-07-27 NOTE — Discharge Instructions (Signed)

## 2017-08-06 DIAGNOSIS — J3089 Other allergic rhinitis: Secondary | ICD-10-CM | POA: Diagnosis not present

## 2017-08-10 DIAGNOSIS — J3089 Other allergic rhinitis: Secondary | ICD-10-CM | POA: Diagnosis not present

## 2017-08-10 DIAGNOSIS — J301 Allergic rhinitis due to pollen: Secondary | ICD-10-CM | POA: Diagnosis not present

## 2017-08-14 DIAGNOSIS — J301 Allergic rhinitis due to pollen: Secondary | ICD-10-CM | POA: Diagnosis not present

## 2017-08-14 DIAGNOSIS — J3089 Other allergic rhinitis: Secondary | ICD-10-CM | POA: Diagnosis not present

## 2017-08-16 NOTE — Progress Notes (Signed)
Cardiology Office Note   Date:  08/17/2017   ID:  Brandon Alt., DOB Jun 13, 1945, MRN [id number]  PCP:  Leanna Battles, MD  Cardiologist:   Minus Breeding, MD   Chief Complaint  Patient presents with  . Atrial Fibrillation      History of Present Illness: Brandon Hamad Whyte. is a 72 y.o. male who presents for evaluation of atrial fib. He has been followed in the atrial fib clinic.  He had an echo which was normal with an EF of 65%.    Since I last saw him he had removal of a cyst from his spine.  Since I last saw him he has done well.  He is exercising routinely.  The patient denies any new symptoms such as neck or arm discomfort. There has been no new shortness of breath, PND or orthopnea. There have been no reported palpitations, presyncope or syncope.   He did have one episode of chest discomfort one night while watching TV.  Seem to radiate up from his epigastric area up to his chest and then went away.  He has not been able to reproduce this or has not had this with any of his activity.  He is never had this before.  He has had no symptomatic recurrence of this recently.     Past Medical History:  Diagnosis Date  . Arthritis   . Asthma   . Diverticulitis   . Diverticulosis   . GERD (gastroesophageal reflux disease)   . Hemorrhoids   . History of echocardiogram    a. Echo 10/16 [facility]):  EF 55%, trace MR, mild TR, mild to mod AI, mild dilated Ao root (41 mm)  . Hyperlipemia   . Organic erectile dysfunction   . PAF (paroxysmal atrial fibrillation) ([place])    a. admx to [facility] in [place], [place] 10/16 with AF with RVR >> converted to NSR with IV Dilt;  b. Flecainide started >> ETT neg for pro-arrhythmia   . Prostate cancer ([place]) 04/25/2006   Gleason 3+4=7  . Prostatitis   . S/P cardiac cath    a. LHC at [facility] 10/16:  LM ok, mLAD 30%, LCx ok, dRCA 20%  . S/P radiation therapy 01/19/2014 through 03/05/2014                                                       Prostate bed 6600 cGy in 33 sessions                          . Torn rotator cuff    right  40% tear    Past Surgical History:  Procedure Laterality Date  . APPENDECTOMY     1967  . cataract surgery Left   . COLONOSCOPY    . KNEE ARTHROSCOPY     R  . LARYNX SURGERY     vocal cord lesion- benign  . LUMBAR LAMINECTOMY/DECOMPRESSION MICRODISCECTOMY Right 07/26/2017   Procedure: RIGHT LUMBAR 3- LUMBAR 4 FORAMINOTOMY WITH RESECTION OF SYNOVIAL CYST;  Surgeon: Erline Levine, MD;  Location: [place];  Service: Neurosurgery;  Laterality: Right;  RIGHT LUMBAR 3- LUMBAR 4 FORAMINOTOMY WITH RESECTION OF SYNOVIAL CYST  . POLYPECTOMY    . PROSTATECTOMY  04/25/2006  Current Outpatient Medications  Medication Sig Dispense Refill  . acetaminophen (TYLENOL) 500 MG tablet Take 500 mg by mouth every 6 (six) hours as needed for mild pain.    Marland Kitchen albuterol (PROVENTIL HFA;VENTOLIN HFA) 108 (90 BASE) MCG/ACT inhaler Inhale 2 puffs into the lungs every 6 (six) hours as needed for wheezing or shortness of breath.     . Alprostadil (PROSTAGLANDIN E1) POWD 1 mL by Does not apply route as needed (FOR ERECTILE DYSFUNCTION).     Marland Kitchen atorvastatin (LIPITOR) 20 MG tablet Take 20 mg by mouth daily.     . cholecalciferol (VITAMIN D) 1000 UNITS tablet Take 1,000 Units by mouth daily.    Marland Kitchen diltiazem (CARDIZEM) 120 MG tablet Take 120 mg by mouth daily.    Marland Kitchen diltiazem (CARDIZEM) 30 MG tablet Take 1 tablet every 4 hours AS NEEDED for afib rapid heart rate over 100 (Patient taking differently: Take 30-60 mg by mouth every 4 (four) hours as needed (for AFIB for heart rate > 100). ) 45 tablet 2  . flecainide (TAMBOCOR) 150 MG tablet Take 0.5 tablets (75 mg total) by mouth 2 (two) times daily. 90 tablet 2  . fluticasone (FLONASE) 50 MCG/ACT nasal spray Place 1 spray into both nostrils daily.     Marland Kitchen gabapentin (NEURONTIN) 100 MG capsule Take 100-400 mg by mouth See admin instructions. Take 100 mg by mouth in the morning  and take 400 mg by mouth at bedtime    . Glucosamine-Chondroit-Vit C-Mn (GLUCOSAMINE 1500 COMPLEX) CAPS Take 1 capsule by mouth daily.    Marland Kitchen HYDROcodone-acetaminophen (NORCO/VICODIN) 5-325 MG tablet Take 1-2 tablets by mouth every 6 (six) hours as needed for severe pain. 60 tablet 0  . L-Lysine 500 MG TABS Take 500 mg by mouth daily.     . methocarbamol (ROBAXIN) 500 MG tablet Take 1 tablet (500 mg total) by mouth every 6 (six) hours as needed for muscle spasms. 60 tablet 1  . Mometasone Furoate (ASMANEX HFA) 100 MCG/ACT AERO Asmanex HFA 100 mcg/actuation aerosol inhaler    . montelukast (SINGULAIR) 10 MG tablet Take 10 mg by mouth at bedtime.    . Multiple Vitamin (MULTIVITAMIN) capsule Take 1 capsule by mouth daily.    . Naphazoline-Glycerin (REDNESS RELIEF OP) Place 2 drops into both eyes 4 (four) times daily as needed (for irritation).    Marland Kitchen omeprazole (PRILOSEC) 20 MG capsule Take 20 mg by mouth 2 (two) times daily before a meal.     . Probiotic Product (ALIGN) 4 MG CAPS Take 4 mg by mouth daily.     . psyllium (METAMUCIL) 58.6 % powder Take 1 packet by mouth 2 (two) times daily.    . vitamin C (ASCORBIC ACID) 500 MG tablet Take 500 mg by mouth daily.    Marland Kitchen zolpidem (AMBIEN) 10 MG tablet Take 10 mg by mouth at bedtime as needed for sleep.     No current facility-administered medications for this visit.     Allergies:   Patient has no known allergies.    ROS:  Please see the history of present illness.   Otherwise, review of systems are positive for none .  All other systems are reviewed and negative.    PHYSICAL EXAM: VS:  BP 134/75   Pulse (!) 56   Ht 5\' 10"  (1.778 m)   Wt 209 lb (94.8 kg)   SpO2 98%   BMI 29.99 kg/m  , BMI Body mass index is 29.99 kg/m.  GENERAL:  Well appearing  NECK:  No jugular venous distention, waveform within normal limits, carotid upstroke brisk and symmetric, no bruits, no thyromegaly LUNGS:  Clear to auscultation bilaterally CHEST:   Unremarkable HEART:  PMI not displaced or sustained,S1 and S2 within normal limits, no S3, no S4, no clicks, no rubs, no murmurs ABD:  Flat, positive bowel sounds normal in frequency in pitch, no bruits, no rebound, no guarding, no midline pulsatile mass, no hepatomegaly, no splenomegaly EXT:  2 plus pulses throughout, no edema, no cyanosis no clubbing    GENERAL:  Well appearing NECK:  No jugular venous distention, waveform within normal limits, carotid upstroke brisk and symmetric, no bruits, no thyromegaly LUNGS:  Clear to auscultation bilaterally BACK:  No CVA tenderness CHEST:  Unremarkable HEART:  PMI not displaced or sustained,S1 and S2 within normal limits, no S3, no S4, no clicks, no rubs, no murmurs ABD:  Flat, positive bowel sounds normal in frequency in pitch, no bruits, no rebound, no guarding, no midline pulsatile mass, no hepatomegaly, no splenomegaly EXT:  2 plus pulses throughout, no edema, no cyanosis no clubbing  EKG:  NA  Recent Labs: 07/18/2017: BUN 15; Creatinine, Ser 1.00; Hemoglobin 13.5; Platelets 159; Potassium 3.9; Sodium 141    Lipid Panel No results found for: CHOL, TRIG, HDL, CHOLHDL, VLDL, LDLCALC, LDLDIRECT    Wt Readings from Last 3 Encounters:  08/17/17 209 lb (94.8 kg)  07/26/17 214 lb (97.1 kg)  07/18/17 214 lb 1.6 oz (97.1 kg)      Other studies Reviewed: Additional studies/ records that were reviewed today include:  None  Review of the above records demonstrates:      ASSESSMENT AND PLAN:  ATRIAL FIB:    Mr. Brynden Thune. has a CHA2DS2 - VASc score of 1.    No change in therapy is indicated.    RISK REDUCTION:  I do not see an indication for ASA.  He will stop this.     Current medicines are reviewed at length with the patient today.  The patient does not have concerns regarding medicines.  The following changes have been made:  As above.   Labs/ tests ordered today include: None     Disposition:   FU with me in 12  months.    Signed, Minus Breeding, MD  08/17/2017 4:37 PM    [facility]

## 2017-08-17 ENCOUNTER — Ambulatory Visit (INDEPENDENT_AMBULATORY_CARE_PROVIDER_SITE_OTHER): Payer: Medicare Other | Admitting: Cardiology

## 2017-08-17 ENCOUNTER — Encounter: Payer: Self-pay | Admitting: Cardiology

## 2017-08-17 VITALS — BP 134/75 | HR 56 | Ht 70.0 in | Wt 209.0 lb

## 2017-08-17 DIAGNOSIS — J301 Allergic rhinitis due to pollen: Secondary | ICD-10-CM | POA: Diagnosis not present

## 2017-08-17 DIAGNOSIS — J3089 Other allergic rhinitis: Secondary | ICD-10-CM | POA: Diagnosis not present

## 2017-08-17 DIAGNOSIS — I480 Paroxysmal atrial fibrillation: Secondary | ICD-10-CM

## 2017-08-17 NOTE — Patient Instructions (Signed)
Medication Instructions:  STOP- Aspirin  If you need a refill on your cardiac medications before your next appointment, please call your pharmacy.  Labwork: None Ordered    Testing/Procedures: None Ordered  Follow-Up: Your physician wants you to follow-up in: 1 Year. You should receive a reminder letter in the mail two months in advance. If you do not receive a letter, please call our office [phone].      Thank you for choosing [facility]!!       

## 2017-08-20 DIAGNOSIS — J301 Allergic rhinitis due to pollen: Secondary | ICD-10-CM | POA: Diagnosis not present

## 2017-08-20 DIAGNOSIS — J3089 Other allergic rhinitis: Secondary | ICD-10-CM | POA: Diagnosis not present

## 2017-08-27 ENCOUNTER — Encounter (HOSPITAL_COMMUNITY): Payer: Self-pay | Admitting: Nurse Practitioner

## 2017-08-27 ENCOUNTER — Ambulatory Visit (HOSPITAL_COMMUNITY)
Admission: RE | Admit: 2017-08-27 | Discharge: 2017-08-27 | Disposition: A | Discharge status: Home / Self Care | Payer: Medicare Other | Source: Ambulatory Visit | Attending: Nurse Practitioner | Admitting: Nurse Practitioner

## 2017-08-27 VITALS — BP 126/68 | HR 60 | Ht 70.0 in | Wt 214.0 lb

## 2017-08-27 DIAGNOSIS — I480 Paroxysmal atrial fibrillation: Secondary | ICD-10-CM

## 2017-08-27 DIAGNOSIS — Z8546 Personal history of malignant neoplasm of prostate: Secondary | ICD-10-CM | POA: Insufficient documentation

## 2017-08-27 DIAGNOSIS — J45909 Unspecified asthma, uncomplicated: Secondary | ICD-10-CM | POA: Diagnosis not present

## 2017-08-27 DIAGNOSIS — Z79899 Other long term (current) drug therapy: Secondary | ICD-10-CM | POA: Insufficient documentation

## 2017-08-27 DIAGNOSIS — K219 Gastro-esophageal reflux disease without esophagitis: Secondary | ICD-10-CM | POA: Diagnosis not present

## 2017-08-27 DIAGNOSIS — Z7951 Long term (current) use of inhaled steroids: Secondary | ICD-10-CM | POA: Diagnosis not present

## 2017-08-27 DIAGNOSIS — E785 Hyperlipidemia, unspecified: Secondary | ICD-10-CM | POA: Insufficient documentation

## 2017-08-27 NOTE — Progress Notes (Signed)
PCP: Leanna Battles, MD Primary Cardiologist: Dr Percival Spanish Primary EP: Dr Laney Pastor. is a 72 y.o. male who presents today for routine electrophysiology followup.  Since last being seen in our clinic, the patient reports doing very well.  He is very active.  He has afib about once every 3-6 months, typically lasting several hours.  He has palpitations and fatigue with his afib.  He has occasional edema and SOB with moderate activity.  He feels that this is stable.  He recently discussed with Dr Percival Spanish.  Conservative measures were advised.   Today, he denies symptoms of palpitations, chest pain, dizziness, presyncope, or syncope.  The patient is otherwise without complaint today.   Past Medical History:  Diagnosis Date  . Arthritis   . Asthma   . Diverticulitis   . Diverticulosis   . GERD (gastroesophageal reflux disease)   . Hemorrhoids   . History of echocardiogram    a. Echo 10/16 [facility]):  EF 55%, trace MR, mild TR, mild to mod AI, mild dilated Ao root (41 mm)  . Hyperlipemia   . Organic erectile dysfunction   . PAF (paroxysmal atrial fibrillation) ([place])    a. admx to [facility] in [place], [place] 10/16 with AF with RVR >> converted to NSR with IV Dilt;  b. Flecainide started >> ETT neg for pro-arrhythmia   . Prostate cancer ([place]) 04/25/2006   Gleason 3+4=7  . Prostatitis   . S/P cardiac cath    a. LHC at [facility] 10/16:  LM ok, mLAD 30%, LCx ok, dRCA 20%  . S/P radiation therapy 01/19/2014 through 03/05/2014                                                      Prostate bed 6600 cGy in 33 sessions                          . Torn rotator cuff    right  40% tear   Past Surgical History:  Procedure Laterality Date  . APPENDECTOMY     1967  . cataract surgery Left   . COLONOSCOPY    . KNEE ARTHROSCOPY     R  . LARYNX SURGERY     vocal cord lesion- benign  . LUMBAR LAMINECTOMY/DECOMPRESSION MICRODISCECTOMY Right 07/26/2017   Procedure:  RIGHT LUMBAR 3- LUMBAR 4 FORAMINOTOMY WITH RESECTION OF SYNOVIAL CYST;  Surgeon: Erline Levine, MD;  Location: [facility];  Service: Neurosurgery;  Laterality: Right;  RIGHT LUMBAR 3- LUMBAR 4 FORAMINOTOMY WITH RESECTION OF SYNOVIAL CYST  . POLYPECTOMY    . PROSTATECTOMY  04/25/2006    ROS- all systems are reviewed and negatives except as per HPI above  Current Outpatient Medications  Medication Sig Dispense Refill  . acetaminophen (TYLENOL) 500 MG tablet Take 500 mg by mouth every 6 (six) hours as needed for mild pain.    Marland Kitchen albuterol (PROVENTIL HFA;VENTOLIN HFA) 108 (90 BASE) MCG/ACT inhaler Inhale 2 puffs into the lungs every 6 (six) hours as needed for wheezing or shortness of breath.     . Alprostadil (PROSTAGLANDIN E1) POWD 1 mL by Does not apply route as needed (FOR ERECTILE DYSFUNCTION).     Marland Kitchen atorvastatin (LIPITOR) 20 MG tablet Take 20 mg by  mouth daily.     . cholecalciferol (VITAMIN D) 1000 UNITS tablet Take 1,000 Units by mouth daily.    Marland Kitchen diltiazem (CARDIZEM) 120 MG tablet Take 120 mg by mouth daily.    Marland Kitchen diltiazem (CARDIZEM) 30 MG tablet Take 1 tablet every 4 hours AS NEEDED for afib rapid heart rate over 100 (Patient taking differently: Take 30-60 mg by mouth every 4 (four) hours as needed (for AFIB for heart rate > 100). ) 45 tablet 2  . flecainide (TAMBOCOR) 150 MG tablet Take 0.5 tablets (75 mg total) by mouth 2 (two) times daily. 90 tablet 2  . fluticasone (FLONASE) 50 MCG/ACT nasal spray Place 1 spray into both nostrils daily.     Marland Kitchen gabapentin (NEURONTIN) 100 MG capsule Take 100-400 mg by mouth See admin instructions. Take 100 mg by mouth in the morning and take 400 mg by mouth at bedtime    . Glucosamine-Chondroit-Vit C-Mn (GLUCOSAMINE 1500 COMPLEX) CAPS Take 1 capsule by mouth daily.    Marland Kitchen HYDROcodone-acetaminophen (NORCO/VICODIN) 5-325 MG tablet Take 1-2 tablets by mouth every 6 (six) hours as needed for severe pain. 60 tablet 0  . L-Lysine 500 MG TABS Take 500 mg by mouth daily.      . methocarbamol (ROBAXIN) 500 MG tablet Take 1 tablet (500 mg total) by mouth every 6 (six) hours as needed for muscle spasms. 60 tablet 1  . Mometasone Furoate (ASMANEX HFA) 100 MCG/ACT AERO Asmanex HFA 100 mcg/actuation aerosol inhaler    . montelukast (SINGULAIR) 10 MG tablet Take 10 mg by mouth at bedtime.    . Multiple Vitamin (MULTIVITAMIN) capsule Take 1 capsule by mouth daily.    . Naphazoline-Glycerin (REDNESS RELIEF OP) Place 2 drops into both eyes 4 (four) times daily as needed (for irritation).    Marland Kitchen omeprazole (PRILOSEC) 20 MG capsule Take 20 mg by mouth 2 (two) times daily before a meal.     . Probiotic Product (ALIGN) 4 MG CAPS Take 4 mg by mouth daily.     . psyllium (METAMUCIL) 58.6 % powder Take 1 packet by mouth 2 (two) times daily.    . vitamin C (ASCORBIC ACID) 500 MG tablet Take 500 mg by mouth daily.    Marland Kitchen zolpidem (AMBIEN) 10 MG tablet Take 10 mg by mouth at bedtime as needed for sleep.     No current facility-administered medications for this encounter.     Physical Exam: Vitals:   08/27/17 1154  Weight: 214 lb (97.1 kg)  Height: 5\' 10"  (1.778 m)    GEN- The patient is well appearing, alert and oriented x 3 today.   Head- normocephalic, atraumatic Eyes-  Sclera clear, conjunctiva pink Ears- hearing intact Oropharynx- clear Lungs- Clear to ausculation bilaterally, normal work of breathing Heart- Regular rate and rhythm, no murmurs, rubs or gallops, PMI not laterally displaced GI- soft, NT, ND, + BS Extremities- no clubbing, cyanosis, or edema  Wt Readings from Last 3 Encounters:  08/27/17 214 lb (97.1 kg)  08/17/17 209 lb (94.8 kg)  07/26/17 214 lb (97.1 kg)     EKG tracing ordered today is personally reviewed and shows sinus rhythm 60 bpm, PR 186 msec, QRS 94 msec, Qtc 410 msec, LAD  Assessment and Plan:  1. paroxysmal atrial fibrillation Doing well with flecainide.   chads2vasc score is 1.  Not on anticoagulation We discussed ablation today.  He  is currently not interested but may consider if his afib progresses Given SOB and edema, will obtain echo  to evaluate for structural changes  Follow-up with Dr Percival Spanish as scheduled Return to AF clinic in 6 months  [name] MD, [facility] 08/27/2017 11:57 AM

## 2017-08-28 DIAGNOSIS — M75111 Incomplete rotator cuff tear or rupture of right shoulder, not specified as traumatic: Secondary | ICD-10-CM | POA: Diagnosis not present

## 2017-08-28 DIAGNOSIS — M7541 Impingement syndrome of right shoulder: Secondary | ICD-10-CM | POA: Diagnosis not present

## 2017-08-28 DIAGNOSIS — M13811 Other specified arthritis, right shoulder: Secondary | ICD-10-CM | POA: Diagnosis not present

## 2017-08-28 DIAGNOSIS — M19011 Primary osteoarthritis, right shoulder: Secondary | ICD-10-CM | POA: Diagnosis not present

## 2017-09-03 DIAGNOSIS — J301 Allergic rhinitis due to pollen: Secondary | ICD-10-CM | POA: Diagnosis not present

## 2017-09-03 DIAGNOSIS — J3089 Other allergic rhinitis: Secondary | ICD-10-CM | POA: Diagnosis not present

## 2017-09-14 DIAGNOSIS — C61 Malignant neoplasm of prostate: Secondary | ICD-10-CM | POA: Diagnosis not present

## 2017-09-24 DIAGNOSIS — M25511 Pain in right shoulder: Secondary | ICD-10-CM | POA: Diagnosis not present

## 2017-09-24 DIAGNOSIS — J3089 Other allergic rhinitis: Secondary | ICD-10-CM | POA: Diagnosis not present

## 2017-09-24 DIAGNOSIS — J301 Allergic rhinitis due to pollen: Secondary | ICD-10-CM | POA: Diagnosis not present

## 2017-09-25 ENCOUNTER — Ambulatory Visit (HOSPITAL_COMMUNITY)
Admission: RE | Admit: 2017-09-25 | Discharge: 2017-09-25 | Disposition: A | Discharge status: Home / Self Care | Payer: Medicare Other | Source: Ambulatory Visit | Attending: Internal Medicine | Admitting: Internal Medicine

## 2017-09-25 DIAGNOSIS — I351 Nonrheumatic aortic (valve) insufficiency: Secondary | ICD-10-CM | POA: Insufficient documentation

## 2017-09-25 DIAGNOSIS — I480 Paroxysmal atrial fibrillation: Secondary | ICD-10-CM | POA: Diagnosis not present

## 2017-09-25 DIAGNOSIS — E785 Hyperlipidemia, unspecified: Secondary | ICD-10-CM | POA: Diagnosis not present

## 2017-09-25 NOTE — Progress Notes (Signed)
  Echocardiogram 2D Echocardiogram has been performed.  Jennette Dubin 09/25/2017, 11:40 AM

## 2017-09-26 DIAGNOSIS — C61 Malignant neoplasm of prostate: Secondary | ICD-10-CM | POA: Diagnosis not present

## 2017-09-26 DIAGNOSIS — N5201 Erectile dysfunction due to arterial insufficiency: Secondary | ICD-10-CM | POA: Diagnosis not present

## 2017-09-27 DIAGNOSIS — M79601 Pain in right arm: Secondary | ICD-10-CM | POA: Diagnosis not present

## 2017-10-01 DIAGNOSIS — M79601 Pain in right arm: Secondary | ICD-10-CM | POA: Diagnosis not present

## 2017-10-03 DIAGNOSIS — M79601 Pain in right arm: Secondary | ICD-10-CM | POA: Diagnosis not present

## 2017-10-08 DIAGNOSIS — J3089 Other allergic rhinitis: Secondary | ICD-10-CM | POA: Diagnosis not present

## 2017-10-08 DIAGNOSIS — J301 Allergic rhinitis due to pollen: Secondary | ICD-10-CM | POA: Diagnosis not present

## 2017-10-08 DIAGNOSIS — M79601 Pain in right arm: Secondary | ICD-10-CM | POA: Diagnosis not present

## 2017-10-11 DIAGNOSIS — M7541 Impingement syndrome of right shoulder: Secondary | ICD-10-CM | POA: Diagnosis not present

## 2017-10-15 ENCOUNTER — Other Ambulatory Visit: Payer: Self-pay

## 2017-10-15 DIAGNOSIS — I824Z9 Acute embolism and thrombosis of unspecified deep veins of unspecified distal lower extremity: Secondary | ICD-10-CM

## 2017-10-15 DIAGNOSIS — M79601 Pain in right arm: Secondary | ICD-10-CM | POA: Diagnosis not present

## 2017-10-16 ENCOUNTER — Ambulatory Visit (HOSPITAL_COMMUNITY)
Admission: RE | Admit: 2017-10-16 | Discharge: 2017-10-16 | Disposition: A | Discharge status: Home / Self Care | Payer: Medicare Other | Source: Ambulatory Visit | Attending: Vascular Surgery | Admitting: Vascular Surgery

## 2017-10-16 DIAGNOSIS — I82401 Acute embolism and thrombosis of unspecified deep veins of right lower extremity: Secondary | ICD-10-CM | POA: Diagnosis not present

## 2017-10-16 DIAGNOSIS — I824Z9 Acute embolism and thrombosis of unspecified deep veins of unspecified distal lower extremity: Secondary | ICD-10-CM | POA: Diagnosis not present

## 2017-10-16 DIAGNOSIS — I4891 Unspecified atrial fibrillation: Secondary | ICD-10-CM | POA: Diagnosis not present

## 2017-10-16 DIAGNOSIS — N182 Chronic kidney disease, stage 2 (mild): Secondary | ICD-10-CM | POA: Diagnosis not present

## 2017-10-16 DIAGNOSIS — Z7901 Long term (current) use of anticoagulants: Secondary | ICD-10-CM | POA: Diagnosis not present

## 2017-10-16 DIAGNOSIS — R600 Localized edema: Secondary | ICD-10-CM | POA: Diagnosis not present

## 2017-10-16 DIAGNOSIS — Z6831 Body mass index (BMI) 31.0-31.9, adult: Secondary | ICD-10-CM | POA: Diagnosis not present

## 2017-10-17 ENCOUNTER — Telehealth (HOSPITAL_COMMUNITY): Payer: Self-pay | Admitting: *Deleted

## 2017-10-17 NOTE — Telephone Encounter (Signed)
Pt cld to inform us that the leg pain he was having the last time he was in the clinic and saw Dr. Rayann Heman had continued to get worse.  He went back for follow up to his back surgeon and they ordered dopplers.  Pt was found to have a DVT and was started on Eliquis 10 mg bid x 7 days, then 5 mg bid for the next 6-8 months.  This was added to medication list.

## 2017-10-18 DIAGNOSIS — M79601 Pain in right arm: Secondary | ICD-10-CM | POA: Diagnosis not present

## 2017-10-25 DIAGNOSIS — I824Z1 Acute embolism and thrombosis of unspecified deep veins of right distal lower extremity: Secondary | ICD-10-CM | POA: Diagnosis not present

## 2017-10-25 DIAGNOSIS — R600 Localized edema: Secondary | ICD-10-CM | POA: Diagnosis not present

## 2017-10-29 DIAGNOSIS — M7541 Impingement syndrome of right shoulder: Secondary | ICD-10-CM | POA: Diagnosis not present

## 2017-10-29 DIAGNOSIS — M5416 Radiculopathy, lumbar region: Secondary | ICD-10-CM | POA: Diagnosis not present

## 2017-10-29 DIAGNOSIS — R2689 Other abnormalities of gait and mobility: Secondary | ICD-10-CM | POA: Diagnosis not present

## 2017-10-29 DIAGNOSIS — Z7901 Long term (current) use of anticoagulants: Secondary | ICD-10-CM | POA: Diagnosis not present

## 2017-10-29 DIAGNOSIS — S43431D Superior glenoid labrum lesion of right shoulder, subsequent encounter: Secondary | ICD-10-CM | POA: Diagnosis not present

## 2017-10-29 DIAGNOSIS — M13819 Other specified arthritis, unspecified shoulder: Secondary | ICD-10-CM | POA: Diagnosis not present

## 2017-10-29 DIAGNOSIS — M21371 Foot drop, right foot: Secondary | ICD-10-CM | POA: Diagnosis not present

## 2017-10-29 DIAGNOSIS — J3089 Other allergic rhinitis: Secondary | ICD-10-CM | POA: Diagnosis not present

## 2017-10-29 DIAGNOSIS — I82401 Acute embolism and thrombosis of unspecified deep veins of right lower extremity: Secondary | ICD-10-CM | POA: Diagnosis not present

## 2017-10-29 DIAGNOSIS — M75101 Unspecified rotator cuff tear or rupture of right shoulder, not specified as traumatic: Secondary | ICD-10-CM | POA: Diagnosis not present

## 2017-10-29 DIAGNOSIS — I4891 Unspecified atrial fibrillation: Secondary | ICD-10-CM | POA: Diagnosis not present

## 2017-11-07 DIAGNOSIS — M47816 Spondylosis without myelopathy or radiculopathy, lumbar region: Secondary | ICD-10-CM | POA: Diagnosis not present

## 2017-11-07 DIAGNOSIS — M5416 Radiculopathy, lumbar region: Secondary | ICD-10-CM | POA: Diagnosis not present

## 2017-11-09 ENCOUNTER — Telehealth (INDEPENDENT_AMBULATORY_CARE_PROVIDER_SITE_OTHER): Payer: Self-pay | Admitting: Physical Medicine and Rehabilitation

## 2017-11-09 ENCOUNTER — Telehealth (INDEPENDENT_AMBULATORY_CARE_PROVIDER_SITE_OTHER): Payer: Self-pay

## 2017-11-09 NOTE — Telephone Encounter (Signed)
ERROR

## 2017-11-09 NOTE — Telephone Encounter (Signed)
Called patient to check availability. He will call me back when he can check his schedule.

## 2017-11-09 NOTE — Telephone Encounter (Signed)
Lookout for right L4 or L5 TF esi, ok on Eliquis, yes need to see if pre-auth, try to work in this coming week or see what is timing is. Had post surgery MRI, should be returning to surgeon  for issues with foot drop?

## 2017-11-09 NOTE — Telephone Encounter (Signed)
Patient missed your call earlier. He would like to set up appt with Dr Ernestina Patches. Please call him back. Thank you  CB  [phone]

## 2017-11-12 DIAGNOSIS — M7989 Other specified soft tissue disorders: Secondary | ICD-10-CM | POA: Diagnosis not present

## 2017-11-12 DIAGNOSIS — M5416 Radiculopathy, lumbar region: Secondary | ICD-10-CM | POA: Diagnosis not present

## 2017-11-12 DIAGNOSIS — R030 Elevated blood-pressure reading, without diagnosis of hypertension: Secondary | ICD-10-CM | POA: Diagnosis not present

## 2017-11-12 DIAGNOSIS — Z6830 Body mass index (BMI) 30.0-30.9, adult: Secondary | ICD-10-CM | POA: Diagnosis not present

## 2017-11-12 DIAGNOSIS — M7138 Other bursal cyst, other site: Secondary | ICD-10-CM | POA: Diagnosis not present

## 2017-11-12 DIAGNOSIS — I10 Essential (primary) hypertension: Secondary | ICD-10-CM | POA: Diagnosis not present

## 2017-11-12 DIAGNOSIS — M48061 Spinal stenosis, lumbar region without neurogenic claudication: Secondary | ICD-10-CM | POA: Diagnosis not present

## 2017-11-12 DIAGNOSIS — M545 Low back pain: Secondary | ICD-10-CM | POA: Diagnosis not present

## 2017-11-12 NOTE — Telephone Encounter (Signed)
Scheduled for 11/26/17 at 1345 with driver.

## 2017-11-13 DIAGNOSIS — J3089 Other allergic rhinitis: Secondary | ICD-10-CM | POA: Diagnosis not present

## 2017-11-13 DIAGNOSIS — J301 Allergic rhinitis due to pollen: Secondary | ICD-10-CM | POA: Diagnosis not present

## 2017-11-14 DIAGNOSIS — Z7901 Long term (current) use of anticoagulants: Secondary | ICD-10-CM | POA: Diagnosis not present

## 2017-11-14 DIAGNOSIS — I82401 Acute embolism and thrombosis of unspecified deep veins of right lower extremity: Secondary | ICD-10-CM | POA: Diagnosis not present

## 2017-11-14 DIAGNOSIS — I4891 Unspecified atrial fibrillation: Secondary | ICD-10-CM | POA: Diagnosis not present

## 2017-11-14 DIAGNOSIS — Z6832 Body mass index (BMI) 32.0-32.9, adult: Secondary | ICD-10-CM | POA: Diagnosis not present

## 2017-11-26 ENCOUNTER — Encounter (INDEPENDENT_AMBULATORY_CARE_PROVIDER_SITE_OTHER): Payer: Self-pay | Admitting: Physical Medicine and Rehabilitation

## 2017-11-27 DIAGNOSIS — J301 Allergic rhinitis due to pollen: Secondary | ICD-10-CM | POA: Diagnosis not present

## 2017-11-27 DIAGNOSIS — J3089 Other allergic rhinitis: Secondary | ICD-10-CM | POA: Diagnosis not present

## 2017-12-17 DIAGNOSIS — M48061 Spinal stenosis, lumbar region without neurogenic claudication: Secondary | ICD-10-CM | POA: Diagnosis not present

## 2017-12-17 DIAGNOSIS — M7989 Other specified soft tissue disorders: Secondary | ICD-10-CM | POA: Diagnosis not present

## 2017-12-17 DIAGNOSIS — J3089 Other allergic rhinitis: Secondary | ICD-10-CM | POA: Diagnosis not present

## 2017-12-17 DIAGNOSIS — M545 Low back pain: Secondary | ICD-10-CM | POA: Diagnosis not present

## 2017-12-17 DIAGNOSIS — M5416 Radiculopathy, lumbar region: Secondary | ICD-10-CM | POA: Diagnosis not present

## 2017-12-17 DIAGNOSIS — M7138 Other bursal cyst, other site: Secondary | ICD-10-CM | POA: Diagnosis not present

## 2017-12-17 DIAGNOSIS — J301 Allergic rhinitis due to pollen: Secondary | ICD-10-CM | POA: Diagnosis not present

## 2017-12-17 DIAGNOSIS — Z6830 Body mass index (BMI) 30.0-30.9, adult: Secondary | ICD-10-CM | POA: Diagnosis not present

## 2017-12-18 DIAGNOSIS — Z23 Encounter for immunization: Secondary | ICD-10-CM | POA: Diagnosis not present

## 2018-01-07 DIAGNOSIS — M5416 Radiculopathy, lumbar region: Secondary | ICD-10-CM | POA: Diagnosis not present

## 2018-01-08 DIAGNOSIS — M5416 Radiculopathy, lumbar region: Secondary | ICD-10-CM | POA: Diagnosis not present

## 2018-01-08 DIAGNOSIS — J301 Allergic rhinitis due to pollen: Secondary | ICD-10-CM | POA: Diagnosis not present

## 2018-01-08 DIAGNOSIS — M21371 Foot drop, right foot: Secondary | ICD-10-CM | POA: Diagnosis not present

## 2018-01-08 DIAGNOSIS — I4891 Unspecified atrial fibrillation: Secondary | ICD-10-CM | POA: Diagnosis not present

## 2018-01-08 DIAGNOSIS — I82401 Acute embolism and thrombosis of unspecified deep veins of right lower extremity: Secondary | ICD-10-CM | POA: Diagnosis not present

## 2018-01-08 DIAGNOSIS — J3089 Other allergic rhinitis: Secondary | ICD-10-CM | POA: Diagnosis not present

## 2018-01-08 DIAGNOSIS — Z6832 Body mass index (BMI) 32.0-32.9, adult: Secondary | ICD-10-CM | POA: Diagnosis not present

## 2018-01-12 ENCOUNTER — Other Ambulatory Visit: Payer: Self-pay | Admitting: Cardiology

## 2018-01-18 DIAGNOSIS — M5416 Radiculopathy, lumbar region: Secondary | ICD-10-CM | POA: Diagnosis not present

## 2018-01-21 DIAGNOSIS — R05 Cough: Secondary | ICD-10-CM | POA: Diagnosis not present

## 2018-01-21 DIAGNOSIS — R062 Wheezing: Secondary | ICD-10-CM | POA: Diagnosis not present

## 2018-01-21 DIAGNOSIS — J181 Lobar pneumonia, unspecified organism: Secondary | ICD-10-CM | POA: Diagnosis not present

## 2018-01-21 DIAGNOSIS — R21 Rash and other nonspecific skin eruption: Secondary | ICD-10-CM | POA: Diagnosis not present

## 2018-01-21 DIAGNOSIS — Z6831 Body mass index (BMI) 31.0-31.9, adult: Secondary | ICD-10-CM | POA: Diagnosis not present

## 2018-01-21 DIAGNOSIS — I4891 Unspecified atrial fibrillation: Secondary | ICD-10-CM | POA: Diagnosis not present

## 2018-01-28 DIAGNOSIS — J3089 Other allergic rhinitis: Secondary | ICD-10-CM | POA: Diagnosis not present

## 2018-01-28 DIAGNOSIS — J301 Allergic rhinitis due to pollen: Secondary | ICD-10-CM | POA: Diagnosis not present

## 2018-01-30 DIAGNOSIS — M5416 Radiculopathy, lumbar region: Secondary | ICD-10-CM | POA: Diagnosis not present

## 2018-02-04 DIAGNOSIS — Z961 Presence of intraocular lens: Secondary | ICD-10-CM | POA: Diagnosis not present

## 2018-02-04 DIAGNOSIS — H26492 Other secondary cataract, left eye: Secondary | ICD-10-CM | POA: Diagnosis not present

## 2018-02-04 DIAGNOSIS — H52203 Unspecified astigmatism, bilateral: Secondary | ICD-10-CM | POA: Diagnosis not present

## 2018-02-04 DIAGNOSIS — H43813 Vitreous degeneration, bilateral: Secondary | ICD-10-CM | POA: Diagnosis not present

## 2018-02-06 DIAGNOSIS — M5416 Radiculopathy, lumbar region: Secondary | ICD-10-CM | POA: Diagnosis not present

## 2018-02-11 ENCOUNTER — Telehealth: Payer: Self-pay | Admitting: Physician Assistant

## 2018-02-11 ENCOUNTER — Emergency Department (HOSPITAL_COMMUNITY)
Admission: EM | Admit: 2018-02-11 | Discharge: 2018-02-11 | Disposition: A | Discharge status: Home / Self Care | Payer: Medicare Other | Attending: Emergency Medicine | Admitting: Emergency Medicine

## 2018-02-11 ENCOUNTER — Other Ambulatory Visit: Payer: Self-pay

## 2018-02-11 ENCOUNTER — Emergency Department (HOSPITAL_COMMUNITY): Payer: Medicare Other

## 2018-02-11 ENCOUNTER — Telehealth (HOSPITAL_COMMUNITY): Payer: Self-pay | Admitting: *Deleted

## 2018-02-11 ENCOUNTER — Encounter (HOSPITAL_COMMUNITY): Payer: Self-pay

## 2018-02-11 DIAGNOSIS — Z7901 Long term (current) use of anticoagulants: Secondary | ICD-10-CM | POA: Diagnosis not present

## 2018-02-11 DIAGNOSIS — R079 Chest pain, unspecified: Secondary | ICD-10-CM | POA: Diagnosis not present

## 2018-02-11 DIAGNOSIS — Z87891 Personal history of nicotine dependence: Secondary | ICD-10-CM | POA: Diagnosis not present

## 2018-02-11 DIAGNOSIS — J45909 Unspecified asthma, uncomplicated: Secondary | ICD-10-CM | POA: Insufficient documentation

## 2018-02-11 DIAGNOSIS — Z79899 Other long term (current) drug therapy: Secondary | ICD-10-CM | POA: Diagnosis not present

## 2018-02-11 DIAGNOSIS — Z0189 Encounter for other specified special examinations: Secondary | ICD-10-CM | POA: Diagnosis not present

## 2018-02-11 DIAGNOSIS — I480 Paroxysmal atrial fibrillation: Secondary | ICD-10-CM

## 2018-02-11 DIAGNOSIS — Z8546 Personal history of malignant neoplasm of prostate: Secondary | ICD-10-CM | POA: Diagnosis not present

## 2018-02-11 LAB — CBC WITH DIFFERENTIAL/PLATELET
Abs Immature Granulocytes: 0.01 10*3/uL (ref 0.00–0.07)
Basophils Absolute: 0 10*3/uL (ref 0.0–0.1)
Basophils Relative: 0 %
Eosinophils Absolute: 0.1 10*3/uL (ref 0.0–0.5)
Eosinophils Relative: 2 %
HCT: 51.5 % (ref 39.0–52.0)
Hemoglobin: 16.3 g/dL (ref 13.0–17.0)
Immature Granulocytes: 0 %
Lymphocytes Relative: 43 %
Lymphs Abs: 2.9 10*3/uL (ref 0.7–4.0)
MCH: 30.3 pg (ref 26.0–34.0)
MCHC: 31.7 g/dL (ref 30.0–36.0)
MCV: 95.7 fL (ref 80.0–100.0)
Monocytes Absolute: 0.6 10*3/uL (ref 0.1–1.0)
Monocytes Relative: 9 %
Neutro Abs: 3.2 10*3/uL (ref 1.7–7.7)
Neutrophils Relative %: 46 %
Platelets: 182 10*3/uL (ref 150–400)
RBC: 5.38 MIL/uL (ref 4.22–5.81)
RDW: 12.2 % (ref 11.5–15.5)
WBC: 6.9 10*3/uL (ref 4.0–10.5)
nRBC: 0 % (ref 0.0–0.2)

## 2018-02-11 LAB — COMPREHENSIVE METABOLIC PANEL
ALT: 26 U/L (ref 0–44)
AST: 32 U/L (ref 15–41)
Albumin: 4 g/dL (ref 3.5–5.0)
Alkaline Phosphatase: 58 U/L (ref 38–126)
Anion gap: 8 (ref 5–15)
BUN: 17 mg/dL (ref 8–23)
CO2: 26 mmol/L (ref 22–32)
Calcium: 9.3 mg/dL (ref 8.9–10.3)
Chloride: 104 mmol/L (ref 98–111)
Creatinine, Ser: 1.19 mg/dL (ref 0.61–1.24)
GFR calc Af Amer: >60 mL/min (ref >60)
GFR calc non Af Amer: 59 mL/min — ABNORMAL LOW (ref >60)
Glucose, Bld: 137 mg/dL — ABNORMAL HIGH (ref 70–99)
Potassium: 3.4 mmol/L — ABNORMAL LOW (ref 3.5–5.1)
Sodium: 138 mmol/L (ref 135–145)
Total Bilirubin: 0.8 mg/dL (ref 0.3–1.2)
Total Protein: 7.1 g/dL (ref 6.5–8.1)

## 2018-02-11 LAB — URINALYSIS, ROUTINE W REFLEX MICROSCOPIC
Bilirubin Urine: NEGATIVE
Glucose, UA: NEGATIVE mg/dL
Hgb urine dipstick: NEGATIVE
Ketones, ur: NEGATIVE mg/dL
Leukocytes, UA: NEGATIVE
Nitrite: NEGATIVE
Protein, ur: NEGATIVE mg/dL
Specific Gravity, Urine: 1.006 (ref 1.005–1.030)
pH: 5 (ref 5.0–8.0)

## 2018-02-11 LAB — PROTIME-INR
INR: 1.04
Prothrombin Time: 13.5 seconds (ref 11.4–15.2)

## 2018-02-11 LAB — I-STAT TROPONIN, ED: Troponin i, poc: 0.02 ng/mL (ref 0.00–0.08)

## 2018-02-11 LAB — MAGNESIUM: Magnesium: 2.2 mg/dL (ref 1.7–2.4)

## 2018-02-11 MED ORDER — DILTIAZEM HCL-DEXTROSE 100-5 MG/100ML-% IV SOLN (PREMIX)
5.0000 mg/h | INTRAVENOUS | Status: DC
  Administered 2018-02-11: 5 mg/h via INTRAVENOUS
  Filled 2018-02-11: qty 100

## 2018-02-11 MED ORDER — DILTIAZEM HCL ER COATED BEADS 180 MG PO CP24: 180.0000 mg | ORAL_CAPSULE | Freq: Every day | ORAL | 0 refills | Status: DC

## 2018-02-11 MED ORDER — ETOMIDATE 2 MG/ML IV SOLN
0.2000 mg/kg | Freq: Once | INTRAVENOUS | Status: AC
  Administered 2018-02-11: 18 mg via INTRAVENOUS
  Filled 2018-02-11: qty 10

## 2018-02-11 MED ORDER — DILTIAZEM LOAD VIA INFUSION
20.0000 mg | Freq: Once | INTRAVENOUS | Status: AC
  Administered 2018-02-11: 20 mg via INTRAVENOUS
  Filled 2018-02-11: qty 20

## 2018-02-11 NOTE — Telephone Encounter (Signed)
Paged by answering service.  Patient has a history of paroxysmal atrial fibrillation currently on Cardizem CD as well as flecainide 75 mg twice daily.  Previously seen by Dr. Rayann Heman and recommended ablation however patient declined.  Patient went into atrial fibrillation yesterday at 2 PM.  He did not break out from A. fib despite conservative management with extra short acting Cardizem.  This morning he is feeling worse.  He is symptomatic with atrial fibrillation.  Blood pressure 135/89 pulse of 75.  He says he is compliant with his Eliquis.  Recommended to come to ER for possible cardioversion and may discuss with EP at that time for ablation versus changing antiarrhythmic.  Otherwise, he will call atrial fibrillation clinic this morning for further recommendation.

## 2018-02-11 NOTE — ED Provider Notes (Signed)
[facility] EMERGENCY DEPARTMENT Provider Note   CSN: [id number] Arrival date & time: 02/11/18  0750     History   Chief Complaint No chief complaint on file.   HPI Brandon Simmons. is a 72 y.o. male.  72 y.o male with a PMH of Paroxysmal Afib, Cardiac cath on eliquis presents to the ED with a chief complaint of Afib and chest pain x yesterday. Patient reports he usually has episodes of PAF every three months but lately these episodes are more frequent with his last episode recorded in September.He reports this episode started yesterday at 1 pm, he then took his diltiazem 30 mg with no relieve, he then took a second dose 30 mg. Patient called his cardiologist who advised him to be seen in the ED. Patient also reports a dull ache chest pain along with a lump on his throat and feeling unwell. He is currently followed by Dr. Milus Banister. He denies any fever, cough, abodminal pain or shortness of breath.     Past Medical History:  Diagnosis Date  . Arthritis   . Asthma   . Diverticulitis   . Diverticulosis   . GERD (gastroesophageal reflux disease)   . Hemorrhoids   . History of echocardiogram    a. Echo 10/16 [facility]):  EF 55%, trace MR, mild TR, mild to mod AI, mild dilated Ao root (41 mm)  . Hyperlipemia   . Organic erectile dysfunction   . PAF (paroxysmal atrial fibrillation) ([place])    a. admx to [facility] in [place], [place] 10/16 with AF with RVR >> converted to NSR with IV Dilt;  b. Flecainide started >> ETT neg for pro-arrhythmia   . Prostate cancer ([place]) 04/25/2006   Gleason 3+4=7  . Prostatitis   . S/P cardiac cath    a. LHC at [facility] 10/16:  LM ok, mLAD 30%, LCx ok, dRCA 20%  . S/P radiation therapy 01/19/2014 through 03/05/2014                                                      Prostate bed 6600 cGy in 33 sessions                          . Torn rotator cuff    right  40% tear    Patient Active Problem List   Diagnosis Date  Noted  . Synovial cyst of lumbar facet joint 07/26/2017  . PAF (paroxysmal atrial fibrillation) ([place]) 02/26/2015  . Malignant neoplasm of prostate ([place]) 10/16/2013    Past Surgical History:  Procedure Laterality Date  . APPENDECTOMY     1967  . cataract surgery Left   . COLONOSCOPY    . KNEE ARTHROSCOPY     R  . LARYNX SURGERY     vocal cord lesion- benign  . LUMBAR LAMINECTOMY/DECOMPRESSION MICRODISCECTOMY Right 07/26/2017   Procedure: RIGHT LUMBAR 3- LUMBAR 4 FORAMINOTOMY WITH RESECTION OF SYNOVIAL CYST;  Surgeon: Erline Levine, MD;  Location: [facility];  Service: Neurosurgery;  Laterality: Right;  RIGHT LUMBAR 3- LUMBAR 4 FORAMINOTOMY WITH RESECTION OF SYNOVIAL CYST  . POLYPECTOMY    . PROSTATECTOMY  04/25/2006        Home Medications    Prior to Admission medications   Medication  Sig Start Date End Date Taking? Authorizing Provider  acetaminophen (TYLENOL) 500 MG tablet Take 500 mg by mouth every 6 (six) hours as needed for mild pain.   Yes [provider]  albuterol (PROVENTIL HFA;VENTOLIN HFA) 108 (90 BASE) MCG/ACT inhaler Inhale 2 puffs into the lungs every 6 (six) hours as needed for wheezing or shortness of breath.    Yes [provider]  Alprostadil (PROSTAGLANDIN E1) POWD 1 mL by Does not apply route as needed (FOR ERECTILE DYSFUNCTION).    Yes [provider]  apixaban (ELIQUIS) 5 MG TABS tablet Take 10 mg bid x 7 days; then take 5 mg bid thereafter; DVT treatment   Yes [provider]  atorvastatin (LIPITOR) 20 MG tablet Take 20 mg by mouth daily.    Yes [provider]  cholecalciferol (VITAMIN D) 1000 UNITS tablet Take 1,000 Units by mouth daily.   Yes [provider]  diltiazem (CARDIZEM) 30 MG tablet Take 1 tablet every 4 hours AS NEEDED for afib rapid heart rate over 100 Patient taking differently: Take 30-60 mg by mouth every 4 (four) hours as needed (for AFIB for heart rate > 100).  12/04/16  Yes Sherran Needs, NP   flecainide (TAMBOCOR) 150 MG tablet Take 0.5 tablets (75 mg total) by mouth 2 (two) times daily. 05/28/17  Yes Sherran Needs, NP  fluticasone (FLONASE) 50 MCG/ACT nasal spray Place 1 spray into both nostrils daily.  09/04/13  Yes [provider]  Glucosamine-Chondroit-Vit C-Mn (GLUCOSAMINE 1500 COMPLEX) CAPS Take 1 capsule by mouth daily.   Yes [provider]  HYDROcodone-acetaminophen (NORCO/VICODIN) 5-325 MG tablet Take 1-2 tablets by mouth every 6 (six) hours as needed for severe pain. 07/27/17  Yes Erline Levine, MD  L-Lysine 500 MG TABS Take 500 mg by mouth daily.    Yes [provider]  methocarbamol (ROBAXIN) 500 MG tablet Take 1 tablet (500 mg total) by mouth every 6 (six) hours as needed for muscle spasms. 07/27/17  Yes Erline Levine, MD  Mometasone Furoate [facility] HFA) 100 MCG/ACT AERO Inhale 1 puff into the lungs daily.    Yes [provider]  montelukast (SINGULAIR) 10 MG tablet Take 10 mg by mouth at bedtime.   Yes [provider]  Multiple Vitamin (MULTIVITAMIN) capsule Take 1 capsule by mouth daily.   Yes [provider]  Naphazoline-Glycerin (REDNESS RELIEF OP) Place 2 drops into both eyes 4 (four) times daily as needed (for irritation).   Yes [provider]  omeprazole (PRILOSEC) 20 MG capsule Take 20 mg by mouth 2 (two) times daily before a meal.    Yes [provider]  predniSONE (DELTASONE) 20 MG tablet Take 10-20 mg by mouth as directed. Uses for asthma flare up 11/05/17  Yes [provider]  Probiotic Product (ALIGN) 4 MG CAPS Take 4 mg by mouth daily.    Yes [provider]  psyllium (METAMUCIL) 58.6 % powder Take 1 packet by mouth 2 (two) times daily.   Yes [provider]  vitamin C (ASCORBIC ACID) 500 MG tablet Take 500 mg by mouth daily.   Yes [provider]  zolpidem (AMBIEN) 10 MG tablet Take 10 mg by mouth at bedtime as needed for sleep.   Yes [provider]  diltiazem (CARDIZEM CD) 180 MG 24 hr capsule Take 1 capsule (180 mg total) by mouth daily. 02/11/18 03/13/18  Janeece Fitting, PA-C    Family History Family History  Problem Relation Age  of Onset  . Emphysema Father   . Lung cancer Father   . Hypertension Mother   . Diabetes Maternal Grandmother   . Heart disease Maternal Grandfather        Died age 34  . Liver cancer Paternal Grandmother   . Heart disease Paternal Grandfather        Died age 74  . Liver cancer Unknown   . Lung cancer Unknown   . Atrial fibrillation Son     Social History Social History   Tobacco Use  . Smoking status: Former Smoker    Packs/day: 1.00    Years: 10.00    Pack years: 10.00    Types: Cigarettes  . Smokeless tobacco: Former Systems developer  . Tobacco comment: 1972  Substance Use Topics  . Alcohol use: Yes    Alcohol/week: 7.0 standard drinks    Types: 7 Glasses of wine per week    Comment: 1 glass wine or beer daily  . Drug use: No     Allergies   Patient has no known allergies.   Review of Systems Review of Systems  Constitutional: Negative for chills.  HENT: Negative for sore throat.   Respiratory: Negative for shortness of breath.   Cardiovascular: Positive for chest pain and palpitations.  Gastrointestinal: Negative for abdominal pain.  Genitourinary: Negative for dysuria and flank pain.  Musculoskeletal: Negative for back pain.  Skin: Negative for pallor and wound.  Neurological: Negative for syncope, light-headedness and headaches.     Physical Exam Updated Vital Signs BP 116/82   Pulse 64   Temp 97.8 F (36.6 C) (Oral)   Resp 17   Ht 5\' 10"  (1.778 m)   Wt 95.3 kg   SpO2 99%   BMI 30.13 kg/m   Physical Exam  Constitutional: He is oriented to person, place, and time. He appears well-developed and well-nourished. No distress.  HENT:  Head: Normocephalic and atraumatic.  Mouth/Throat: Oropharynx is clear and moist.  Eyes: Pupils are equal, round, and  reactive to light. No scleral icterus.  Neck: Normal range of motion.  Cardiovascular: An irregularly irregular rhythm present.  Pulmonary/Chest: Effort normal and breath sounds normal. He has no wheezes. He exhibits no tenderness.  Abdominal: Soft. Bowel sounds are normal. He exhibits no distension. There is no tenderness.  Musculoskeletal: He exhibits no tenderness or deformity.  Neurological: He is alert and oriented to person, place, and time.  Skin: Skin is warm and dry. He is not diaphoretic.  Nursing note and vitals reviewed.    ED Treatments / Results  Labs (all labs ordered are listed, but only abnormal results are displayed) Labs Reviewed  COMPREHENSIVE METABOLIC PANEL - Abnormal; Notable for the following components:      Result Value   Potassium 3.4 (*)    Glucose, Bld 137 (*)    GFR calc non Af Amer 59 (*)    All other components within normal limits  CBC WITH DIFFERENTIAL/PLATELET  PROTIME-INR  URINALYSIS, ROUTINE W REFLEX MICROSCOPIC  MAGNESIUM  I-STAT TROPONIN, ED    EKG EKG Interpretation  Date/Time:  Monday February 11 2018 07:57:27 EST Ventricular Rate:  115 PR Interval:    QRS Duration: 87 QT Interval:  264 QTC Calculation: 365 R Axis:   23 Text Interpretation:  Atrial fibrillation Abnormal R-wave progression, early transition Repol abnrm, severe global ischemia (LM/MVD) afib new since Aug 27 2017 Confirmed by Jola Schmidt [id number]) on 02/11/2018 8:12:38 AM   Radiology Dg Chest 2 View  Result  Date: 02/11/2018 CLINICAL DATA:  Onset of chest pain since 2 last night. History of atrial fibrillation, asthma, prostate malignancy, former smoker. EXAM: CHEST - 2 VIEW COMPARISON:  Chest x-ray of May 29, 2017 FINDINGS: The lungs are mildly hyperinflated. There is no focal infiltrate. There is no pleural effusion. The heart and pulmonary vascularity are normal. The mediastinum is normal in width. The bony thorax exhibits no acute abnormality. IMPRESSION: Mild  chronic bronchitic changes, stable. No pneumonia, CHF, nor other acute cardiopulmonary disease. Electronically Signed   By: David  Martinique M.D.   On: 02/11/2018 09:06    Procedures .Cardioversion Date/Time: 02/11/2018 12:59 PM Performed by: Janeece Fitting, PA-C Authorized by: Janeece Fitting, PA-C   Consent:    Consent obtained:  Verbal and written   Consent given by:  Patient   Risks discussed:  Cutaneous burn, induced arrhythmia, pain and death   Alternatives discussed:  No treatment and rate-control medication Pre-procedure details:    Cardioversion basis:  Elective   Rhythm:  Atrial fibrillation   Electrode placement:  Anterior-posterior Attempt one:    Cardioversion mode:  Synchronous   Waveform:  Biphasic   Shock (Joules):  200   Shock outcome:  Conversion to normal sinus rhythm Post-procedure details:    Patient status:  Awake   Patient tolerance of procedure:  Tolerated well, no immediate complications   (including critical care time)  Medications Ordered in ED Medications  diltiazem (CARDIZEM) 1 mg/mL load via infusion 20 mg (20 mg Intravenous Bolus from Bag 02/11/18 0907)    And  diltiazem (CARDIZEM) 100 mg in dextrose 5% 114mL (1 mg/mL) infusion (0 mg/hr Intravenous Stopped 02/11/18 1153)  etomidate (AMIDATE) injection 19.06 mg (18 mg Intravenous Given 02/11/18 1155)     Initial Impression / Assessment and Plan / ED Course  I have reviewed the triage vital signs and the nursing notes.  Pertinent labs & imaging results that were available during my care of the patient were reviewed by me and considered in my medical decision making (see chart for details).    Patient with a previous history of paroxysmal A. fib presents to the ED with recurrent symptoms more frequently than usual.  Episode began yesterday at 1 PM he has tried 30 mg of diltiazem x2 but reports no relieving symptoms.  Rival patient's heart rate is in the 100s he is in no distress and well-appearing.   Start patient on a diltiazem drip, he is currently on Eliquis and reports not missing any doses and being compliant with his medication.  MP showed no electrolyte abnormality, creatinine is stable.  CBC showed no leukocytosis, patient is afebrile.  PT/INR within normal limits, patient currently on Eliquis.  Stat troponin was negative at 0.02.  9:56 AM he reports feeling slightly better with the drip as his heart rate is now in the 70s.  I have discussed this patient with Dr. Venora Maples who has also seen this patient, a couple of consults to cardiology have been placed the advice for [place] to cardiovert her at this time.  Cardioversion to be done with myself and Dr. Venora Maples at the bedside.  12:55 PM patient was successfully sedated with 0.2 of etomidate and went under synchronized cardioversion. He is now in NSR, has been monitor for 1 hour. He will follow up with his established cardiologist for a possible ablation in the future.  Vitals stable during ED visit, patient stable for discharge.  Final Clinical Impressions(s) / ED Diagnoses   Final diagnoses:  Paroxysmal  atrial fibrillation [facility])  Encounter for cardioversion procedure    ED Discharge Orders         Ordered    diltiazem (CARDIZEM CD) 180 MG 24 hr capsule  Daily     02/11/18 1303           Janeece Fitting, PA-C 02/11/18 1303    Jola Schmidt, MD 02/11/18 1644

## 2018-02-11 NOTE — Discharge Instructions (Addendum)
I have prescribed Cardizem 180mg , please discontinue your 120 mg Cardizem. Please follow up with cardiologist Dr. Percival Spanish at your earliest convenience. If you experience any chest pain, shortness of breath or worsening symptoms please return to the ED for reevaluation.

## 2018-02-11 NOTE — ED Notes (Signed)
Patient transported to X-ray 

## 2018-02-11 NOTE — Telephone Encounter (Signed)
Patient called in stating he is ready to further discuss ablation with Dr. Rayann Heman. He was in the ER for 22 hours which ended in cardioversion and this is second episode of breakthrough in the last 2 weeks. Appointment requested with Dr. Rayann Heman in for further discussion - pt has follow up mid-December here in the interim if needed. Pt in agreement.

## 2018-02-11 NOTE — ED Notes (Signed)
Pt transported to Xray. 

## 2018-02-11 NOTE — ED Triage Notes (Signed)
Pt reports that he goes in and out of a-fib. Pt reports that normally he takes diltiazem at home and he goes back into normal rhythm. Pt reports that he has been in a-fib since 1350 yesterday and has a dull ache in his chest. Pt states that his cardiologist recommended he come to the ED for evaluation.

## 2018-02-13 DIAGNOSIS — M5416 Radiculopathy, lumbar region: Secondary | ICD-10-CM | POA: Diagnosis not present

## 2018-02-18 ENCOUNTER — Encounter: Payer: Self-pay | Admitting: Internal Medicine

## 2018-02-18 ENCOUNTER — Ambulatory Visit (INDEPENDENT_AMBULATORY_CARE_PROVIDER_SITE_OTHER): Payer: Medicare Other | Admitting: Internal Medicine

## 2018-02-18 VITALS — BP 136/80 | HR 61 | Ht 70.0 in | Wt 214.2 lb

## 2018-02-18 DIAGNOSIS — J3089 Other allergic rhinitis: Secondary | ICD-10-CM | POA: Diagnosis not present

## 2018-02-18 DIAGNOSIS — Z01812 Encounter for preprocedural laboratory examination: Secondary | ICD-10-CM

## 2018-02-18 DIAGNOSIS — I480 Paroxysmal atrial fibrillation: Secondary | ICD-10-CM

## 2018-02-18 DIAGNOSIS — J301 Allergic rhinitis due to pollen: Secondary | ICD-10-CM | POA: Diagnosis not present

## 2018-02-18 MED ORDER — FLECAINIDE ACETATE 100 MG PO TABS: 100.0000 mg | ORAL_TABLET | Freq: Two times a day (BID) | ORAL | 3 refills | Status: DC

## 2018-02-18 NOTE — Patient Instructions (Addendum)
Medication Instructions:  Your physician has recommended you make the following change in your medication:  1. INCREASE Flecainide -- take 100 mg twice daily  If you need a refill on your cardiac medications before your next appointment, please call your pharmacy.   Lab work: Your physician recommends that you return for pre procedure lab work between: 12/11 - 12/31 If you have labs (blood work) drawn today and your tests are completely normal, you will receive your results only by: Marland Kitchen MyChart Message (if you have MyChart) OR . A paper copy in the mail If you have any lab test that is abnormal or we need to change your treatment, we will call you to review the results.  Testing/Procedures: Your physician has requested that you have cardiac CT within 7 days PRIOR to your ablation. Cardiac computed tomography (CT) is a painless test that uses an x-ray machine to take clear, detailed pictures of your heart. For further information please visit [email or web address].  Please see instructions below located under special instructions area.  The office will call you to arrange this test  Your physician has recommended that you have an ablation. Catheter ablation is a medical procedure used to treat some cardiac arrhythmias (irregular heartbeats). During catheter ablation, a long, thin, flexible tube is put into a blood vessel in your groin (upper thigh), or neck. This tube is called an ablation catheter. It is then guided to your heart through the blood vessel. Radio frequency waves destroy small areas of heart tissue where abnormal heartbeats may cause an arrhythmia to start. Please see instructions below located under special instructions area.   Follow-Up: Your physician recommends that you schedule a follow-up appointment in: 4 weeks, after your procedure on 03/28/2018, with Roderic Palau NP in the AFib clinic.  Your physician recommends that you schedule a follow up appointment in: 3 months, after  your procedure on 03/28/2018, with Dr. Rayann Heman.  *Please note that any paperwork needing to be filled out by the provider will need to be addressed at the front desk prior to seeing the provider. Please note that any FMLA, disability or other documents regarding health condition is subject to a $25.00 charge that must be received prior to completion of paperwork in the form of a money order or check.  Thank you for choosing [facility]!! [phone]   Any Other Special Instructions Will Be Listed Below  CARDIAC CT INSTRUCTIONS:  Please arrive at the [facility] main entrance of [facility] at ______ AM (30-45 minutes prior to test start time) [facility] [place], [place] [address] [phone]  Proceed to the [facility] Radiology Department (First Floor).  Please follow these instructions carefully (unless otherwise directed):  Hold all erectile dysfunction medications at least 48 hours prior to test.  On the Night Before the Test: . Drink plenty of water. . Do not consume any caffeinated/decaffeinated beverages or chocolate 12 hours prior to your test. . Do not take any antihistamines 12 hours prior to your test.  On the Day of the Test: . Drink plenty of water. Do not drink any water within one hour of the test. . Do not eat any food 4 hours prior to the test. . You may take your regular medications prior to the test. . MAKE SURE TO TAKE YOUR DILTIAZEM 2 HOURS PRIOR TO THIS TEST  . HOLD Furosemide morning of the test.  After the Test: . Drink plenty of water. . After receiving  IV contrast, you may experience a mild flushed feeling. This is normal. . On occasion, you may experience a mild rash up to 24 hours after the test. This is not dangerous. If this occurs, you can take Benadryl 25 mg and increase your fluid intake. . If you experience trouble breathing, this can be serious. If it is severe call 911 IMMEDIATELY. If it is mild,  please call our office.    Instructions for your ablation: 1. Please arrive at the [facility], Main Entrance "A", of [facility] at 7:30 A.M. on 03/28/2018. 2. Do not eat or drink after midnight the night prior to the procedure. 3. Do not miss any doses of ELIQUIS prior to the morning of the procedure.  4. Do not take any medications the morning of the procedure. 5. Both of your groins will need to be shaved for this procedure (if needed). We ask that you do this yourself at home 1-2 days prior to the procedure.  If you are unable/uncomfortable to do yourself, the hospital staff will shave you the day of your procedure (if needed). 6. Plan for an overnight stay in the hospital. 7. You will need someone to drive you home at discharge.    Cardiac Ablation Cardiac ablation is a procedure to disable (ablate) a small amount of heart tissue in very specific places. The heart has many electrical connections. Sometimes these connections are abnormal and can cause the heart to beat very fast or irregularly. Ablating some of the problem areas can improve the heart rhythm or return it to normal. Ablation may be done for people who:  Have Wolff-Parkinson-White syndrome.  Have fast heart rhythms (tachycardia).  Have taken medicines for an abnormal heart rhythm (arrhythmia) that were not effective or caused side effects.  Have a high-risk heartbeat that may be life-threatening.  During the procedure, a small incision is made in the neck or the groin, and a long, thin, flexible tube (catheter) is inserted into the incision and moved to the heart. Small devices (electrodes) on the tip of the catheter will send out electrical currents. A type of X-ray (fluoroscopy) will be used to help guide the catheter and to provide images of the heart. Tell a health care provider about:  Any allergies you have.  All medicines you are taking, including vitamins, herbs, eye drops, creams, and over-the-counter  medicines.  Any problems you or family members have had with anesthetic medicines.  Any blood disorders you have.  Any surgeries you have had.  Any medical conditions you have, such as kidney failure.  Whether you are pregnant or may be pregnant. What are the risks? Generally, this is a safe procedure. However, problems may occur, including:  Infection.  Bruising and bleeding at the catheter insertion site.  Bleeding into the chest, especially into the sac that surrounds the heart. This is a serious complication.  Stroke or blood clots.  Damage to other structures or organs.  Allergic reaction to medicines or dyes.  Need for a permanent pacemaker if the normal electrical system is damaged. A pacemaker is a small computer that sends electrical signals to the heart and helps your heart beat normally.  The procedure not being fully effective. This may not be recognized until months later. Repeat ablation procedures are sometimes required.  What happens before the procedure?  Follow instructions from your health care provider about eating or drinking restrictions.  Ask your health care provider about: ? Changing or stopping your regular medicines.  This is especially important if you are taking diabetes medicines or blood thinners. ? Taking medicines such as aspirin and ibuprofen. These medicines can thin your blood. Do not take these medicines before your procedure if your health care provider instructs you not to.  Plan to have someone take you home from the hospital or clinic.  If you will be going home right after the procedure, plan to have someone with you for 24 hours. What happens during the procedure?  To lower your risk of infection: ? Your health care team will wash or sanitize their hands. ? Your skin will be washed with soap. ? Hair may be removed from the incision area.  An IV tube will be inserted into one of your veins.  You will be given a medicine to  help you relax (sedative).  The skin on your neck or groin will be numbed.  An incision will be made in your neck or your groin.  A needle will be inserted through the incision and into a large vein in your neck or groin.  A catheter will be inserted into the needle and moved to your heart.  Dye may be injected through the catheter to help your surgeon see the area of the heart that needs treatment.  Electrical currents will be sent from the catheter to ablate heart tissue in desired areas. There are three types of energy that may be used to ablate heart tissue: ? Heat (radiofrequency energy). ? Laser energy. ? Extreme cold (cryoablation).  When the necessary tissue has been ablated, the catheter will be removed.  Pressure will be held on the catheter insertion area to prevent excessive bleeding.  A bandage (dressing) will be placed over the catheter insertion area. The procedure may vary among health care providers and hospitals. What happens after the procedure?  Your blood pressure, heart rate, breathing rate, and blood oxygen level will be monitored until the medicines you were given have worn off.  Your catheter insertion area will be monitored for bleeding. You will need to lie still for a few hours to ensure that you do not bleed from the catheter insertion area.  Do not drive for 24 hours or as long as directed by your health care provider. Summary  Cardiac ablation is a procedure to disable (ablate) a small amount of heart tissue in very specific places. Ablating some of the problem areas can improve the heart rhythm or return it to normal.  During the procedure, electrical currents will be sent from the catheter to ablate heart tissue in desired areas. This information is not intended to replace advice given to you by your health care provider. Make sure you discuss any questions you have with your health care provider. Document Released: 07/23/2008 Document Revised:  01/24/2016 Document Reviewed: 01/24/2016 Elsevier Interactive Patient Education  [employer].

## 2018-02-18 NOTE — Progress Notes (Signed)
PCP: Leanna Battles, MD Primary Cardiologist: Dr Percival Spanish Primary EP: Dr Laney Pastor. is a 72 y.o. male who presents today for routine electrophysiology followup.  Since last being seen in our clinic, the patient reports doing reasonably well. He has had more afib, resulting in a prolonged ER visit in November.  He presents for further discussions.  He feels that he has had increasing frequency and duration of atrial fibrillation.  Today, he denies symptoms of palpitations, chest pain, shortness of breath,  lower extremity edema, dizziness, presyncope, or syncope.  The patient is otherwise without complaint today.   Past Medical History:  Diagnosis Date  . Arthritis   . Asthma   . Diverticulitis   . Diverticulosis   . GERD (gastroesophageal reflux disease)   . Hemorrhoids   . History of echocardiogram    a. Echo 10/16 [facility]):  EF 55%, trace MR, mild TR, mild to mod AI, mild dilated Ao root (41 mm)  . Hyperlipemia   . Organic erectile dysfunction   . PAF (paroxysmal atrial fibrillation) ([place])    a. admx to [facility] in [place], [place] 10/16 with AF with RVR >> converted to NSR with IV Dilt;  b. Flecainide started >> ETT neg for pro-arrhythmia   . Prostate cancer ([place]) 04/25/2006   Gleason 3+4=7  . Prostatitis   . S/P cardiac cath    a. LHC at [facility] 10/16:  LM ok, mLAD 30%, LCx ok, dRCA 20%  . S/P radiation therapy 01/19/2014 through 03/05/2014                                                      Prostate bed 6600 cGy in 33 sessions                          . Torn rotator cuff    right  40% tear   Past Surgical History:  Procedure Laterality Date  . APPENDECTOMY     1967  . cataract surgery Left   . COLONOSCOPY    . KNEE ARTHROSCOPY     R  . LARYNX SURGERY     vocal cord lesion- benign  . LUMBAR LAMINECTOMY/DECOMPRESSION MICRODISCECTOMY Right 07/26/2017   Procedure: RIGHT LUMBAR 3- LUMBAR 4 FORAMINOTOMY WITH RESECTION OF SYNOVIAL CYST;   Surgeon: Erline Levine, MD;  Location: [facility];  Service: Neurosurgery;  Laterality: Right;  RIGHT LUMBAR 3- LUMBAR 4 FORAMINOTOMY WITH RESECTION OF SYNOVIAL CYST  . POLYPECTOMY    . PROSTATECTOMY  04/25/2006    ROS- all systems are reviewed and negatives except as per HPI above  Current Outpatient Medications  Medication Sig Dispense Refill  . acetaminophen (TYLENOL) 500 MG tablet Take 500 mg by mouth every 6 (six) hours as needed for mild pain.    Marland Kitchen albuterol (PROVENTIL HFA;VENTOLIN HFA) 108 (90 BASE) MCG/ACT inhaler Inhale 2 puffs into the lungs every 6 (six) hours as needed for wheezing or shortness of breath.     . Alprostadil (PROSTAGLANDIN E1) POWD 1 mL by Does not apply route as needed (FOR ERECTILE DYSFUNCTION).     Marland Kitchen apixaban (ELIQUIS) 5 MG TABS tablet Take 10 mg bid x 7 days; then take 5 mg bid thereafter; DVT treatment    . atorvastatin (  LIPITOR) 20 MG tablet Take 20 mg by mouth daily.     . cholecalciferol (VITAMIN D) 1000 UNITS tablet Take 1,000 Units by mouth daily.    Marland Kitchen diltiazem (CARDIZEM CD) 180 MG 24 hr capsule Take 1 capsule (180 mg total) by mouth daily. 30 capsule 0  . diltiazem (CARDIZEM) 30 MG tablet Take 1 tablet every 4 hours AS NEEDED for afib rapid heart rate over 100 (Patient taking differently: Take 30-60 mg by mouth every 4 (four) hours as needed (for AFIB for heart rate > 100). ) 45 tablet 2  . flecainide (TAMBOCOR) 150 MG tablet Take 0.5 tablets (75 mg total) by mouth 2 (two) times daily. 90 tablet 2  . fluticasone (FLONASE) 50 MCG/ACT nasal spray Place 1 spray into both nostrils daily.     . Glucosamine-Chondroit-Vit C-Mn (GLUCOSAMINE 1500 COMPLEX) CAPS Take 1 capsule by mouth daily.    Marland Kitchen HYDROcodone-acetaminophen (NORCO/VICODIN) 5-325 MG tablet Take 1-2 tablets by mouth every 6 (six) hours as needed for severe pain. 60 tablet 0  . L-Lysine 500 MG TABS Take 500 mg by mouth daily.     . methocarbamol (ROBAXIN) 500 MG tablet Take 1 tablet (500 mg total) by mouth  every 6 (six) hours as needed for muscle spasms. 60 tablet 1  . Mometasone Furoate (ASMANEX HFA) 100 MCG/ACT AERO Inhale 1 puff into the lungs daily.     . montelukast (SINGULAIR) 10 MG tablet Take 10 mg by mouth at bedtime.    . Multiple Vitamin (MULTIVITAMIN) capsule Take 1 capsule by mouth daily.    . Naphazoline-Glycerin (REDNESS RELIEF OP) Place 2 drops into both eyes 4 (four) times daily as needed (for irritation).    Marland Kitchen omeprazole (PRILOSEC) 20 MG capsule Take 20 mg by mouth 2 (two) times daily before a meal.     . predniSONE (DELTASONE) 20 MG tablet Take 10-20 mg by mouth as directed. Uses for asthma flare up  0  . Probiotic Product (ALIGN) 4 MG CAPS Take 4 mg by mouth daily.     . psyllium (METAMUCIL) 58.6 % powder Take 1 packet by mouth 2 (two) times daily.    . vitamin C (ASCORBIC ACID) 500 MG tablet Take 500 mg by mouth daily.    Marland Kitchen zolpidem (AMBIEN) 10 MG tablet Take 10 mg by mouth at bedtime as needed for sleep.     No current facility-administered medications for this visit.     Physical Exam: Vitals:   02/18/18 1104  BP: 136/80  Pulse: 61  SpO2: 98%  Weight: 214 lb 3.2 oz (97.2 kg)  Height: 5\' 10"  (1.778 m)    GEN- The patient is well appearing, alert and oriented x 3 today.   Head- normocephalic, atraumatic Eyes-  Sclera clear, conjunctiva pink Ears- hearing intact Oropharynx- clear Lungs- Clear to ausculation bilaterally, normal work of breathing Heart- Regular rate and rhythm, no murmurs, rubs or gallops, PMI not laterally displaced GI- soft, NT, ND, + BS Extremities- no clubbing, cyanosis, or edema  Wt Readings from Last 3 Encounters:  02/18/18 214 lb 3.2 oz (97.2 kg)  02/11/18 210 lb (95.3 kg)  08/27/17 214 lb (97.1 kg)    EKG tracing ordered today is personally reviewed and shows sinus rhythm 61 bpm, PR 192 msec, QRS 96 msec, Qtc 398 msec  Echo 09/25/17 reveals EF 50%, mild AI, mild aortic enlargement, normal LA size  Assessment and Plan:  1.  Paroxysmal atrial fibrillation The patient has symptomatic, recurrent paroxysmal atrial fibrillation.  he has failed medical therapy with flecainide. Chads2vasc score is 1.  he is anticoagulated with eliquis . Therapeutic strategies for afib including medicine and ablation were discussed in detail with the patient today. Risk, benefits, and alternatives to EP study and radiofrequency ablation for afib were also discussed in detail today. These risks include but are not limited to stroke, bleeding, vascular damage, tamponade, perforation, damage to the esophagus, lungs, and other structures, pulmonary vein stenosis, worsening renal function, and death. The patient understands these risk and wishes to proceed.  We will therefore proceed with catheter ablation at the next available time.  Carto, ICE, anesthesia are requested for the procedure.  Will also obtain cardiac CT prior to the procedure to exclude LAA thrombus and further evaluate atrial anatomy.  Increase flecainide to 100mg  BID in the interim.   [name] MD, [facility] 02/18/2018 11:10 AM

## 2018-02-19 NOTE — Addendum Note (Signed)
Addended by: Rose Phi on: 02/19/2018 04:19 PM   Modules accepted: Orders

## 2018-02-20 DIAGNOSIS — M5416 Radiculopathy, lumbar region: Secondary | ICD-10-CM | POA: Diagnosis not present

## 2018-02-21 ENCOUNTER — Other Ambulatory Visit (HOSPITAL_COMMUNITY): Payer: Self-pay | Admitting: *Deleted

## 2018-02-22 ENCOUNTER — Other Ambulatory Visit (HOSPITAL_COMMUNITY): Payer: Self-pay | Admitting: Nurse Practitioner

## 2018-02-22 DIAGNOSIS — I480 Paroxysmal atrial fibrillation: Secondary | ICD-10-CM

## 2018-02-27 DIAGNOSIS — M5416 Radiculopathy, lumbar region: Secondary | ICD-10-CM | POA: Diagnosis not present

## 2018-03-05 ENCOUNTER — Other Ambulatory Visit: Payer: Medicare Other

## 2018-03-05 ENCOUNTER — Other Ambulatory Visit: Payer: Self-pay | Admitting: Internal Medicine

## 2018-03-05 DIAGNOSIS — Z01812 Encounter for preprocedural laboratory examination: Secondary | ICD-10-CM | POA: Diagnosis not present

## 2018-03-05 DIAGNOSIS — J301 Allergic rhinitis due to pollen: Secondary | ICD-10-CM | POA: Diagnosis not present

## 2018-03-05 DIAGNOSIS — J3089 Other allergic rhinitis: Secondary | ICD-10-CM | POA: Diagnosis not present

## 2018-03-05 DIAGNOSIS — I480 Paroxysmal atrial fibrillation: Secondary | ICD-10-CM | POA: Diagnosis not present

## 2018-03-05 MED ORDER — DILTIAZEM HCL ER COATED BEADS 180 MG PO CP24: 180.0000 mg | ORAL_CAPSULE | Freq: Every day | ORAL | 3 refills | Status: DC

## 2018-03-06 ENCOUNTER — Ambulatory Visit (HOSPITAL_COMMUNITY): Payer: Self-pay | Admitting: Nurse Practitioner

## 2018-03-06 DIAGNOSIS — M5416 Radiculopathy, lumbar region: Secondary | ICD-10-CM | POA: Diagnosis not present

## 2018-03-06 DIAGNOSIS — M48061 Spinal stenosis, lumbar region without neurogenic claudication: Secondary | ICD-10-CM | POA: Diagnosis not present

## 2018-03-06 DIAGNOSIS — M7138 Other bursal cyst, other site: Secondary | ICD-10-CM | POA: Diagnosis not present

## 2018-03-06 DIAGNOSIS — M545 Low back pain: Secondary | ICD-10-CM | POA: Diagnosis not present

## 2018-03-06 LAB — BASIC METABOLIC PANEL
BUN/Creatinine Ratio: 15 (ref 10–24)
BUN: 17 mg/dL (ref 8–27)
CO2: 24 mmol/L (ref 20–29)
Calcium: 9.4 mg/dL (ref 8.6–10.2)
Chloride: 105 mmol/L (ref 96–106)
Creatinine, Ser: 1.13 mg/dL (ref 0.76–1.27)
GFR calc Af Amer: 74 mL/min/{1.73_m2} (ref >59)
GFR calc non Af Amer: 64 mL/min/{1.73_m2} (ref >59)
Glucose: 105 mg/dL — ABNORMAL HIGH (ref 65–99)
Potassium: 3.7 mmol/L (ref 3.5–5.2)
Sodium: 142 mmol/L (ref 134–144)

## 2018-03-06 LAB — CBC
Hematocrit: 39.2 % (ref 37.5–51.0)
Hemoglobin: 13.7 g/dL (ref 13.0–17.7)
MCH: 32.2 pg (ref 26.6–33.0)
MCHC: 34.9 g/dL (ref 31.5–35.7)
MCV: 92 fL (ref 79–97)
Platelets: 170 10*3/uL (ref 150–450)
RBC: 4.26 x10E6/uL (ref 4.14–5.80)
RDW: 12.4 % (ref 12.3–15.4)
WBC: 5.5 10*3/uL (ref 3.4–10.8)

## 2018-03-14 ENCOUNTER — Encounter (HOSPITAL_COMMUNITY): Payer: Self-pay

## 2018-03-14 ENCOUNTER — Telehealth (HOSPITAL_COMMUNITY): Payer: Self-pay | Admitting: *Deleted

## 2018-03-14 ENCOUNTER — Emergency Department (HOSPITAL_COMMUNITY)
Admission: EM | Admit: 2018-03-14 | Discharge: 2018-03-14 | Disposition: A | Discharge status: Home / Self Care | Payer: Medicare Other | Attending: Emergency Medicine | Admitting: Emergency Medicine

## 2018-03-14 ENCOUNTER — Other Ambulatory Visit: Payer: Self-pay

## 2018-03-14 DIAGNOSIS — Z86718 Personal history of other venous thrombosis and embolism: Secondary | ICD-10-CM | POA: Insufficient documentation

## 2018-03-14 DIAGNOSIS — R002 Palpitations: Secondary | ICD-10-CM

## 2018-03-14 DIAGNOSIS — Z8546 Personal history of malignant neoplasm of prostate: Secondary | ICD-10-CM | POA: Diagnosis not present

## 2018-03-14 DIAGNOSIS — R0789 Other chest pain: Secondary | ICD-10-CM | POA: Diagnosis not present

## 2018-03-14 DIAGNOSIS — R0602 Shortness of breath: Secondary | ICD-10-CM | POA: Insufficient documentation

## 2018-03-14 DIAGNOSIS — R42 Dizziness and giddiness: Secondary | ICD-10-CM | POA: Insufficient documentation

## 2018-03-14 DIAGNOSIS — Z7901 Long term (current) use of anticoagulants: Secondary | ICD-10-CM | POA: Insufficient documentation

## 2018-03-14 LAB — BASIC METABOLIC PANEL
Anion gap: 6 (ref 5–15)
BUN: 13 mg/dL (ref 8–23)
CO2: 25 mmol/L (ref 22–32)
Calcium: 9.1 mg/dL (ref 8.9–10.3)
Chloride: 110 mmol/L (ref 98–111)
Creatinine, Ser: 1.04 mg/dL (ref 0.61–1.24)
GFR calc Af Amer: >60 mL/min (ref >60)
GFR calc non Af Amer: >60 mL/min (ref >60)
Glucose, Bld: 125 mg/dL — ABNORMAL HIGH (ref 70–99)
Potassium: 3.6 mmol/L (ref 3.5–5.1)
Sodium: 141 mmol/L (ref 135–145)

## 2018-03-14 LAB — CBC
HCT: 45.3 % (ref 39.0–52.0)
Hemoglobin: 15.1 g/dL (ref 13.0–17.0)
MCH: 31.7 pg (ref 26.0–34.0)
MCHC: 33.3 g/dL (ref 30.0–36.0)
MCV: 95 fL (ref 80.0–100.0)
Platelets: 170 10*3/uL (ref 150–400)
RBC: 4.77 MIL/uL (ref 4.22–5.81)
RDW: 12.2 % (ref 11.5–15.5)
WBC: 5.8 10*3/uL (ref 4.0–10.5)
nRBC: 0 % (ref 0.0–0.2)

## 2018-03-14 LAB — I-STAT TROPONIN, ED: Troponin i, poc: 0 ng/mL (ref 0.00–0.08)

## 2018-03-14 NOTE — ED Provider Notes (Signed)
Emergency Department Provider Note   I have reviewed the triage vital signs and the nursing notes.   HISTORY  Chief Complaint Chest Pain   HPI Brandon Simmons. is a 72 y.o. male with PMH of asthma, GERD, HLD, PAF and DVT on Eliquis, and prostate cancer presents to the emergency department for evaluation of heart palpitations with associated chest pain, lightheadedness, shortness of breath.  Symptoms feel typical to his A. fib episodes in the past.  He has been compliant with his anticoagulation, flecainide, and took Cardizem last night when symptoms were more severe and persistent.  When he awoke this morning he was no longer having chest pain and symptoms had decreased along with his heart rate at home.  Like he may still be in A. fib so presented to the emergency department.  He denies any chest pain, shortness of breath, palpitations at the time of my evaluation.  No flulike symptoms.  No radiation of symptoms or modifying factors.  Past Medical History:  Diagnosis Date  . Arthritis   . Asthma   . Diverticulitis   . Diverticulosis   . GERD (gastroesophageal reflux disease)   . Hemorrhoids   . History of echocardiogram    a. Echo 10/16 [facility]):  EF 55%, trace MR, mild TR, mild to mod AI, mild dilated Ao root (41 mm)  . Hyperlipemia   . Organic erectile dysfunction   . PAF (paroxysmal atrial fibrillation) ([place])    a. admx to [facility] in [place], [place] 10/16 with AF with RVR >> converted to NSR with IV Dilt;  b. Flecainide started >> ETT neg for pro-arrhythmia   . Prostate cancer ([place]) 04/25/2006   Gleason 3+4=7  . Prostatitis   . S/P cardiac cath    a. LHC at [facility] 10/16:  LM ok, mLAD 30%, LCx ok, dRCA 20%  . S/P radiation therapy 01/19/2014 through 03/05/2014                                                      Prostate bed 6600 cGy in 33 sessions                          . Torn rotator cuff    right  40% tear    Patient Active Problem List   Diagnosis Date Noted  . Synovial cyst of lumbar facet joint 07/26/2017  . PAF (paroxysmal atrial fibrillation) ([place]) 02/26/2015  . Malignant neoplasm of prostate ([place]) 10/16/2013    Past Surgical History:  Procedure Laterality Date  . APPENDECTOMY     1967  . cataract surgery Left   . COLONOSCOPY    . KNEE ARTHROSCOPY     R  . LARYNX SURGERY     vocal cord lesion- benign  . LUMBAR LAMINECTOMY/DECOMPRESSION MICRODISCECTOMY Right 07/26/2017   Procedure: RIGHT LUMBAR 3- LUMBAR 4 FORAMINOTOMY WITH RESECTION OF SYNOVIAL CYST;  Surgeon: Erline Levine, MD;  Location: [place];  Service: Neurosurgery;  Laterality: Right;  RIGHT LUMBAR 3- LUMBAR 4 FORAMINOTOMY WITH RESECTION OF SYNOVIAL CYST  . POLYPECTOMY    . PROSTATECTOMY  04/25/2006   Allergies Patient has no known allergies.  Family History  Problem Relation Age of Onset  . Emphysema Father   . Lung cancer Father   .  Hypertension Mother   . Diabetes Maternal Grandmother   . Heart disease Maternal Grandfather        Died age 12  . Liver cancer Paternal Grandmother   . Heart disease Paternal Grandfather        Died age 21  . Liver cancer Other   . Lung cancer Other   . Atrial fibrillation Son     Social History Social History   Tobacco Use  . Smoking status: Former Smoker    Packs/day: 1.00    Years: 10.00    Pack years: 10.00    Types: Cigarettes  . Smokeless tobacco: Former Systems developer  . Tobacco comment: 1972  Substance Use Topics  . Alcohol use: Yes    Alcohol/week: 7.0 standard drinks    Types: 7 Glasses of wine per week    Comment: 1 glass wine or beer daily  . Drug use: No    Review of Systems  Constitutional: No fever/chills. Positive lightheadedness.  Eyes: No visual changes. ENT: No sore throat. Cardiovascular: Positive chest pain and palpitations.  Respiratory: Positive shortness of breath. Gastrointestinal: No abdominal pain.  No nausea, no vomiting.  No diarrhea.  No constipation. Genitourinary: Negative  for dysuria. Musculoskeletal: Negative for back pain. Skin: Negative for rash. Neurological: Negative for headaches, focal weakness or numbness.  10-point ROS otherwise negative.  ____________________________________________   PHYSICAL EXAM:  VITAL SIGNS: ED Triage Vitals  Enc Vitals Group     BP 03/14/18 0932 128/68     Pulse Rate 03/14/18 0932 (!) 51     Resp 03/14/18 0932 15     Temp 03/14/18 0932 98.3 F (36.8 C)     Temp Source 03/14/18 0932 Oral     SpO2 03/14/18 0932 98 %   Constitutional: Alert and oriented. Well appearing and in no acute distress. Eyes: Conjunctivae are normal. . Head: Atraumatic. Nose: No congestion/rhinnorhea. Mouth/Throat: Mucous membranes are moist.  Neck: No stridor.   Cardiovascular: Sinus bradycardia. Good peripheral circulation. Grossly normal heart sounds.   Respiratory: Normal respiratory effort.  No retractions. Lungs CTAB. Gastrointestinal: Soft and nontender. No distention.  Musculoskeletal: No lower extremity tenderness nor edema. No gross deformities of extremities. Neurologic:  Normal speech and language. No gross focal neurologic deficits are appreciated.  Skin:  Skin is warm, dry and intact. No rash noted.  ____________________________________________   LABS (all labs ordered are listed, but only abnormal results are displayed)  Labs Reviewed  BASIC METABOLIC PANEL - Abnormal; Notable for the following components:      Result Value   Glucose, Bld 125 (*)    All other components within normal limits  CBC  I-STAT TROPONIN, ED   ____________________________________________  EKG   EKG Interpretation  Date/Time:  Thursday March 14 2018 09:31:34 EST Ventricular Rate:  55 PR Interval:    QRS Duration: 103 QT Interval:  417 QTC Calculation: 399 R Axis:   5 Text Interpretation:  Sinus rhythm No STEMI.  Confirmed by Nanda Quinton [id number] on 03/14/2018 9:40:38 AM        ____________________________________________  RADIOLOGY  CXR not obtained  ____________________________________________   PROCEDURES  Procedure(s) performed:   Procedures  None ____________________________________________   INITIAL IMPRESSION / ASSESSMENT AND PLAN / ED COURSE  Pertinent labs & imaging results that were available during my care of the patient were reviewed by me and considered in my medical decision making (see chart for details).  Patient arrives to the emergency department with sensation of atrial fibrillation which  began last night.  Symptoms improved significantly this morning.  Patient did have some chest discomfort yesterday evening but none this morning.  States that he has had chest discomfort with A. fib in the past.  He is sinus bradycardia here.  Normal blood pressure.  Awake and alert.  Plan for screening labs and chest x-ray given his chest pain.  No ischemic changes on EKG.  Patient will require ongoing outpatient cardiology follow-up and is due for an ablation procedure in early January.   12:40 PM Patient has not gone for CXR. Called radiology and they say that he must been overlooked by transport. Updated patient on normal labs. He remains in sinus rhythm here. He states that he is ready to go and would like to be discharged. Discussed reasoning behind ordering CXR. Patient verbalizes understanding but is requesting discharge. Has close Cardiology follow up in place. Discussed ED return precautions in detail. Will discharge at this time.  ____________________________________________  FINAL CLINICAL IMPRESSION(S) / ED DIAGNOSES  Final diagnoses:  Palpitations    Note:  This document was prepared using Dragon voice recognition software and may include unintentional dictation errors.  Nanda Quinton, MD Emergency Medicine    Long, [facility], MD 03/14/18 [id number]

## 2018-03-14 NOTE — Discharge Instructions (Signed)
Call your Cardiologist today to schedule a follow up appointment. Return to the ED with any chest pain, shortness of breath, passing out, or other concerning symptoms.

## 2018-03-14 NOTE — ED Triage Notes (Signed)
Per pt: Pt states "I started with a fib last night, I felt my heart jump in my chest. I have a cuff at home that measured it". I tried diltizam and last night and again at 1 am and it didn't work and it used to work for me. Pt endorses "a little pain in my chest and last night I was a little short of breath". Pt states that his last a fib episode was 02/11/18.

## 2018-03-14 NOTE — Telephone Encounter (Signed)
Pt cld reporting going into afib at midnight.  He has taken 2 doses of his diltiazem and even though his BP is not very low, he is reporting dizziness from the afib with HR between 89 to 113.  Pt states he feels terrible and was asking if he should proceed for dccv to ED. Per Pt has not missed any doses of his Eliquis, and has had nothing to eat today.  Pt not comfortable with increasing his dilt.  After checking with MD, if pt not comfortable increasing his breakthrough dilt to 60 mg since he is dizzy and feeling bad already, if he has not missed blood thinner, he can proceed to ED for dccv. Pt understood.  Pt has ablation scheduled 03/28/2017

## 2018-03-22 ENCOUNTER — Telehealth (HOSPITAL_COMMUNITY): Payer: Self-pay | Admitting: Emergency Medicine

## 2018-03-22 NOTE — Telephone Encounter (Signed)
Reaching out to patient to offer assistance regarding upcoming cardiac imaging study; pt verbalizes understanding of appt date/time, parking situation and where to check in, pre-test NPO status and medications ordered, and verified current allergies; name and call back number provided for further questions should they arise Pt states he takes his 180mg  diltiazem daily at night before bed. I instructed him to check his heart rate in the morning before his scan to make sure he doesn't need to take his breakthrough dose of diltiazem 30-60mg . I told him the goal was to have his HR around 60bpm which he states is his baseline Marchia Bond RN Navigator Cardiac Imaging [phone]

## 2018-03-26 ENCOUNTER — Ambulatory Visit (HOSPITAL_COMMUNITY): Admission: RE | Admit: 2018-03-26 | Payer: Medicare Other | Source: Ambulatory Visit

## 2018-03-26 ENCOUNTER — Ambulatory Visit (HOSPITAL_COMMUNITY)
Admission: RE | Admit: 2018-03-26 | Discharge: 2018-03-26 | Disposition: A | Discharge status: Home / Self Care | Payer: Medicare Other | Source: Ambulatory Visit | Attending: Internal Medicine | Admitting: Internal Medicine

## 2018-03-26 DIAGNOSIS — I480 Paroxysmal atrial fibrillation: Secondary | ICD-10-CM

## 2018-03-26 DIAGNOSIS — J3089 Other allergic rhinitis: Secondary | ICD-10-CM | POA: Diagnosis not present

## 2018-03-26 DIAGNOSIS — J301 Allergic rhinitis due to pollen: Secondary | ICD-10-CM | POA: Diagnosis not present

## 2018-03-26 MED ORDER — IOPAMIDOL (ISOVUE-370) INJECTION 76%
80.0000 mL | Freq: Once | INTRAVENOUS | Status: AC | PRN
  Administered 2018-03-26: 80 mL via INTRAVENOUS

## 2018-03-28 ENCOUNTER — Ambulatory Visit (HOSPITAL_COMMUNITY)
Admission: RE | Disposition: A | Discharge status: Home / Self Care | Payer: Self-pay | Source: Home / Self Care | Attending: Internal Medicine

## 2018-03-28 ENCOUNTER — Other Ambulatory Visit: Payer: Self-pay

## 2018-03-28 ENCOUNTER — Ambulatory Visit (HOSPITAL_COMMUNITY): Payer: Medicare Other | Admitting: Certified Registered Nurse Anesthetist

## 2018-03-28 ENCOUNTER — Encounter (HOSPITAL_COMMUNITY): Payer: Self-pay | Admitting: General Practice

## 2018-03-28 ENCOUNTER — Ambulatory Visit (HOSPITAL_COMMUNITY)
Admission: RE | Admit: 2018-03-28 | Discharge: 2018-03-29 | Disposition: A | Discharge status: Home / Self Care | Payer: Medicare Other | Attending: Internal Medicine | Admitting: Internal Medicine

## 2018-03-28 DIAGNOSIS — E785 Hyperlipidemia, unspecified: Secondary | ICD-10-CM | POA: Diagnosis not present

## 2018-03-28 DIAGNOSIS — M1990 Unspecified osteoarthritis, unspecified site: Secondary | ICD-10-CM | POA: Diagnosis not present

## 2018-03-28 DIAGNOSIS — K219 Gastro-esophageal reflux disease without esophagitis: Secondary | ICD-10-CM | POA: Diagnosis not present

## 2018-03-28 DIAGNOSIS — Z7901 Long term (current) use of anticoagulants: Secondary | ICD-10-CM | POA: Insufficient documentation

## 2018-03-28 DIAGNOSIS — I480 Paroxysmal atrial fibrillation: Secondary | ICD-10-CM | POA: Diagnosis present

## 2018-03-28 DIAGNOSIS — Z79899 Other long term (current) drug therapy: Secondary | ICD-10-CM | POA: Insufficient documentation

## 2018-03-28 DIAGNOSIS — I4891 Unspecified atrial fibrillation: Secondary | ICD-10-CM | POA: Diagnosis not present

## 2018-03-28 DIAGNOSIS — Z7951 Long term (current) use of inhaled steroids: Secondary | ICD-10-CM | POA: Insufficient documentation

## 2018-03-28 HISTORY — PX: ATRIAL FIBRILLATION ABLATION: EP1191

## 2018-03-28 HISTORY — PX: ABLATION OF DYSRHYTHMIC FOCUS: SHX254

## 2018-03-28 LAB — POCT ACTIVATED CLOTTING TIME
Activated Clotting Time: 230 seconds
Activated Clotting Time: 263 seconds
Activated Clotting Time: 312 seconds
Activated Clotting Time: 378 seconds
Activated Clotting Time: 208 seconds
Activated Clotting Time: 224 seconds
Activated Clotting Time: 191 seconds

## 2018-03-28 SURGERY — ATRIAL FIBRILLATION ABLATION
Anesthesia: General

## 2018-03-28 MED ORDER — ZOLPIDEM TARTRATE 5 MG PO TABS
5.0000 mg | ORAL_TABLET | Freq: Every evening | ORAL | Status: DC | PRN
  Administered 2018-03-28: 5 mg via ORAL
  Filled 2018-03-28: qty 1

## 2018-03-28 MED ORDER — SODIUM CHLORIDE 0.9% FLUSH: 3.0000 mL | INTRAVENOUS | Status: DC | PRN

## 2018-03-28 MED ORDER — EPHEDRINE SULFATE-NACL 50-0.9 MG/10ML-% IV SOSY
PREFILLED_SYRINGE | INTRAVENOUS | Status: DC | PRN
  Administered 2018-03-28 (×3): 5 mg via INTRAVENOUS

## 2018-03-28 MED ORDER — ALBUTEROL SULFATE (2.5 MG/3ML) 0.083% IN NEBU: 2.5000 mg | INHALATION_SOLUTION | Freq: Four times a day (QID) | RESPIRATORY_TRACT | Status: DC | PRN

## 2018-03-28 MED ORDER — SODIUM CHLORIDE 0.9 % IV SOLN: 250.0000 mL | INTRAVENOUS | Status: DC | PRN

## 2018-03-28 MED ORDER — DEXAMETHASONE SODIUM PHOSPHATE 10 MG/ML IJ SOLN
INTRAMUSCULAR | Status: DC | PRN
  Administered 2018-03-28: 10 mg via INTRAVENOUS

## 2018-03-28 MED ORDER — HYDROCODONE-ACETAMINOPHEN 5-325 MG PO TABS: 1.0000 | ORAL_TABLET | Freq: Four times a day (QID) | ORAL | Status: DC | PRN

## 2018-03-28 MED ORDER — METHOCARBAMOL 500 MG PO TABS: 500.0000 mg | ORAL_TABLET | Freq: Four times a day (QID) | ORAL | Status: DC | PRN

## 2018-03-28 MED ORDER — NITROGLYCERIN 0.4 MG SL SUBL
SUBLINGUAL_TABLET | SUBLINGUAL | Status: AC
  Filled 2018-03-28: qty 1

## 2018-03-28 MED ORDER — PROPOFOL 10 MG/ML IV BOLUS
INTRAVENOUS | Status: DC | PRN
  Administered 2018-03-28: 10 mg via INTRAVENOUS
  Administered 2018-03-28: 150 mg via INTRAVENOUS

## 2018-03-28 MED ORDER — APIXABAN 5 MG PO TABS
5.0000 mg | ORAL_TABLET | Freq: Two times a day (BID) | ORAL | Status: DC
  Administered 2018-03-28 – 2018-03-29 (×2): 5 mg via ORAL
  Filled 2018-03-28 (×2): qty 1

## 2018-03-28 MED ORDER — NEOSTIGMINE METHYLSULFATE 5 MG/5ML IV SOSY
PREFILLED_SYRINGE | INTRAVENOUS | Status: DC | PRN
  Administered 2018-03-28: 3 mg via INTRAVENOUS

## 2018-03-28 MED ORDER — BUPIVACAINE HCL (PF) 0.25 % IJ SOLN
INTRAMUSCULAR | Status: DC | PRN
  Administered 2018-03-28: 30 mL

## 2018-03-28 MED ORDER — ACETAMINOPHEN 325 MG PO TABS
650.0000 mg | ORAL_TABLET | ORAL | Status: DC | PRN
  Administered 2018-03-28: 650 mg via ORAL

## 2018-03-28 MED ORDER — HEPARIN SODIUM (PORCINE) 1000 UNIT/ML IJ SOLN
INTRAMUSCULAR | Status: AC
  Filled 2018-03-28: qty 1

## 2018-03-28 MED ORDER — HEPARIN SODIUM (PORCINE) 1000 UNIT/ML IJ SOLN
INTRAMUSCULAR | Status: DC | PRN
  Administered 2018-03-28 (×3): 5000 [IU] via INTRAVENOUS

## 2018-03-28 MED ORDER — ACETAMINOPHEN 325 MG PO TABS
ORAL_TABLET | ORAL | Status: AC
  Filled 2018-03-28: qty 2

## 2018-03-28 MED ORDER — ROCURONIUM BROMIDE 10 MG/ML (PF) SYRINGE
PREFILLED_SYRINGE | INTRAVENOUS | Status: DC | PRN
  Administered 2018-03-28: 50 mg via INTRAVENOUS

## 2018-03-28 MED ORDER — BUPIVACAINE HCL (PF) 0.25 % IJ SOLN
INTRAMUSCULAR | Status: AC
  Filled 2018-03-28: qty 30

## 2018-03-28 MED ORDER — GLYCOPYRROLATE PF 0.2 MG/ML IJ SOSY
PREFILLED_SYRINGE | INTRAMUSCULAR | Status: DC | PRN
  Administered 2018-03-28: 0.4 mg via INTRAVENOUS

## 2018-03-28 MED ORDER — SODIUM CHLORIDE 0.9 % IV SOLN
INTRAVENOUS | Status: DC | PRN
  Administered 2018-03-28: 20 ug/min via INTRAVENOUS

## 2018-03-28 MED ORDER — ONDANSETRON HCL 4 MG/2ML IJ SOLN: 4.0000 mg | Freq: Four times a day (QID) | INTRAMUSCULAR | Status: DC | PRN

## 2018-03-28 MED ORDER — PANTOPRAZOLE SODIUM 40 MG PO TBEC
40.0000 mg | DELAYED_RELEASE_TABLET | Freq: Every day | ORAL | Status: DC
  Administered 2018-03-28 – 2018-03-29 (×2): 40 mg via ORAL
  Filled 2018-03-28 (×3): qty 1

## 2018-03-28 MED ORDER — HEPARIN (PORCINE) IN NACL 1000-0.9 UT/500ML-% IV SOLN
INTRAVENOUS | Status: AC
  Filled 2018-03-28: qty 500

## 2018-03-28 MED ORDER — FENTANYL CITRATE (PF) 100 MCG/2ML IJ SOLN
INTRAMUSCULAR | Status: DC | PRN
  Administered 2018-03-28 (×2): 25 ug via INTRAVENOUS
  Administered 2018-03-28: 50 ug via INTRAVENOUS

## 2018-03-28 MED ORDER — HYDROCODONE-ACETAMINOPHEN 5-325 MG PO TABS
1.0000 | ORAL_TABLET | ORAL | Status: DC | PRN
  Administered 2018-03-28: 2 via ORAL
  Filled 2018-03-28: qty 2

## 2018-03-28 MED ORDER — FENTANYL CITRATE (PF) 100 MCG/2ML IJ SOLN
12.5000 ug | Freq: Once | INTRAMUSCULAR | Status: AC
  Administered 2018-03-28: 12.5 ug via INTRAVENOUS

## 2018-03-28 MED ORDER — FENTANYL CITRATE (PF) 100 MCG/2ML IJ SOLN
INTRAMUSCULAR | Status: AC
  Filled 2018-03-28: qty 2

## 2018-03-28 MED ORDER — PROTAMINE SULFATE 10 MG/ML IV SOLN
INTRAVENOUS | Status: DC | PRN
  Administered 2018-03-28: 30 mg via INTRAVENOUS

## 2018-03-28 MED ORDER — ISOPROTERENOL HCL 0.2 MG/ML IJ SOLN
INTRAMUSCULAR | Status: AC
  Filled 2018-03-28: qty 5

## 2018-03-28 MED ORDER — MIDAZOLAM HCL 5 MG/5ML IJ SOLN
INTRAMUSCULAR | Status: DC | PRN
  Administered 2018-03-28 (×2): 1 mg via INTRAVENOUS

## 2018-03-28 MED ORDER — HEPARIN (PORCINE) IN NACL 1000-0.9 UT/500ML-% IV SOLN
INTRAVENOUS | Status: DC | PRN
  Administered 2018-03-28 (×2): 500 mL

## 2018-03-28 MED ORDER — LIDOCAINE 2% (20 MG/ML) 5 ML SYRINGE
INTRAMUSCULAR | Status: DC | PRN
  Administered 2018-03-28: 40 mg via INTRAVENOUS

## 2018-03-28 MED ORDER — SODIUM CHLORIDE 0.9 % IV SOLN
INTRAVENOUS | Status: DC
  Administered 2018-03-28 (×2): via INTRAVENOUS

## 2018-03-28 MED ORDER — ZOLPIDEM TARTRATE 5 MG PO TABS: 10.0000 mg | ORAL_TABLET | Freq: Every evening | ORAL | Status: DC | PRN

## 2018-03-28 MED ORDER — SODIUM CHLORIDE 0.9% FLUSH
3.0000 mL | Freq: Two times a day (BID) | INTRAVENOUS | Status: DC
  Administered 2018-03-28: 3 mL via INTRAVENOUS

## 2018-03-28 MED ORDER — HEPARIN SODIUM (PORCINE) 1000 UNIT/ML IJ SOLN
INTRAMUSCULAR | Status: DC | PRN
  Administered 2018-03-28: 1000 [IU] via INTRAVENOUS
  Administered 2018-03-28: 12000 [IU] via INTRAVENOUS
  Administered 2018-03-28: 1000 [IU] via INTRAVENOUS

## 2018-03-28 MED ORDER — ONDANSETRON HCL 4 MG/2ML IJ SOLN
INTRAMUSCULAR | Status: DC | PRN
  Administered 2018-03-28: 4 mg via INTRAVENOUS

## 2018-03-28 MED ORDER — PHENYLEPHRINE HCL 10 MG/ML IJ SOLN
INTRAMUSCULAR | Status: DC | PRN
  Administered 2018-03-28: 80 ug via INTRAVENOUS

## 2018-03-28 MED ORDER — NITROGLYCERIN 0.4 MG SL SUBL
0.4000 mg | SUBLINGUAL_TABLET | SUBLINGUAL | Status: DC | PRN
  Administered 2018-03-28: 0.4 mg via SUBLINGUAL

## 2018-03-28 MED ORDER — ISOPROTERENOL HCL 0.2 MG/ML IJ SOLN
INTRAVENOUS | Status: DC | PRN
  Administered 2018-03-28: 20 ug/min via INTRAVENOUS

## 2018-03-28 MED ORDER — MONTELUKAST SODIUM 10 MG PO TABS
10.0000 mg | ORAL_TABLET | Freq: Every day | ORAL | Status: DC
  Administered 2018-03-28: 10 mg via ORAL
  Filled 2018-03-28: qty 1

## 2018-03-28 MED ORDER — ALBUTEROL SULFATE HFA 108 (90 BASE) MCG/ACT IN AERS: 2.0000 | INHALATION_SPRAY | Freq: Four times a day (QID) | RESPIRATORY_TRACT | Status: DC | PRN

## 2018-03-28 SURGICAL SUPPLY — 17 items
BLANKET WARM UNDERBOD FULL ACC (MISCELLANEOUS) ×1 IMPLANT
CATH MAPPNG PENTARAY F 2-6-2MM (CATHETERS) IMPLANT
CATH NAVISTAR SMARTTOUCH DF (ABLATOR) ×1 IMPLANT
CATH SOUNDSTAR ECO 8FR (CATHETERS) ×1 IMPLANT
CATH WEBSTER BI DIR CS D-F CRV (CATHETERS) ×1 IMPLANT
COVER SWIFTLINK CONNECTOR (BAG) ×1 IMPLANT
PACK EP LATEX FREE (CUSTOM PROCEDURE TRAY) ×2
PACK EP LF (CUSTOM PROCEDURE TRAY) ×1 IMPLANT
PAD PRO RADIOLUCENT 2001M-C (PAD) ×2 IMPLANT
PATCH CARTO3 (PAD) ×1 IMPLANT
PENTARAY F 2-6-2MM (CATHETERS) ×2
SHEATH AVANTI 11F 11CM (SHEATH) IMPLANT
SHEATH BAYLIS TRANSSEPTAL 98CM (NEEDLE) ×1 IMPLANT
SHEATH PINNACLE 7F 10CM (SHEATH) ×2 IMPLANT
SHEATH PINNACLE 8F 10CM (SHEATH) ×1 IMPLANT
SHEATH PINNACLE 9F 10CM (SHEATH) ×1 IMPLANT
TUBING SMART ABLATE COOLFLOW (TUBING) ×1 IMPLANT

## 2018-03-28 NOTE — Anesthesia Postprocedure Evaluation (Signed)
Anesthesia Post Note  Patient: Brandon Simmons.  Procedure(s) Performed: ATRIAL FIBRILLATION ABLATION (N/A )     Patient location during evaluation: Cath Lab Anesthesia Type: General Level of consciousness: awake and alert, patient cooperative and oriented Pain management: pain level controlled Vital Signs Assessment: post-procedure vital signs reviewed and stable Respiratory status: spontaneous breathing, nonlabored ventilation, respiratory function stable and patient connected to nasal cannula oxygen Cardiovascular status: blood pressure returned to baseline and stable : nausea resolved. Anesthetic complications: no    Last Vitals:  Vitals:   03/28/18 1525 03/28/18 1530  BP: 108/66 113/69  Pulse: 60 60  Resp: 11 14  Temp:    SpO2: 99% 100%    Last Pain:  Vitals:   03/28/18 1434  TempSrc: Temporal  PainSc:                  Melynda Krzywicki,E. Merdith Adan

## 2018-03-28 NOTE — Discharge Instructions (Signed)
Post procedure care instructions No driving for 4 days. No lifting over 5 lbs for 1 week. No vigorous or sexual activity for 1 week. You may return to work on 04/04/2018. Keep procedure site clean & dry. If you notice increased pain, swelling, bleeding or pus, call/return!  You may shower, but no soaking baths/hot tubs/pools for 1 week.   You have an appointment set up with the [facility].  Multiple studies have shown that being followed by a dedicated atrial fibrillation clinic in addition to the standard care you receive from your other physicians improves health. We believe that enrollment in the atrial fibrillation clinic will allow [place] to better care for you.   The phone number to the [facility] is [phone]. The clinic is staffed Monday through Friday from 8:30am to 5pm.  Parking Directions: The clinic is located in the Heart and Vascular Building connected to [facility]. 1)From [address] turn on to [address] and go to the 3rd entrance  (Heart and Vascular entrance) on the right. 2)Look to the right for Heart &Vascular Parking Garage. 3)A code for the entrance is required please call the clinic to receive this.   4)Take the elevators to the 1st floor. Registration is in the room with the glass walls at the end of the hallway.  If you have any trouble parking or locating the clinic, please dont hesitate to call [phone].

## 2018-03-28 NOTE — Progress Notes (Signed)
Site area: Right groin a 7, 8, and 11 french venous sheath was removed  Site Prior to Removal:  Level 0  Pressure Applied For 30+ MINUTES    Bedrest Beginning at 1715p  Manual:   Yes.    Patient Status During Pull:  stable  Post Pull Groin Site:  Level 0  Post Pull Instructions Given:  Yes.    Post Pull Pulses Present:  Yes.    Dressing Applied:  Yes.    Comments:  VS remain stable

## 2018-03-28 NOTE — H&P (Signed)
PCP: Leanna Battles, MD Primary Cardiologist: Dr Percival Spanish Primary EP: Dr Laney Pastor. is a 73 y.o. male who presents today for routine electrophysiology study and ablation for Afib.  Since last being seen in our clinic, the patient reports doing reasonably well. He has had more afib, resulting in a prolonged ER visit in December.  Today, he denies symptoms of palpitations, chest pain, shortness of breath,  lower extremity edema, dizziness, presyncope, or syncope.  The patient is otherwise without complaint today.       Past Medical History:  Diagnosis Date  . Arthritis   . Asthma   . Diverticulitis   . Diverticulosis   . GERD (gastroesophageal reflux disease)   . Hemorrhoids   . History of echocardiogram    a. Echo 10/16 [facility]):  EF 55%, trace MR, mild TR, mild to mod AI, mild dilated Ao root (41 mm)  . Hyperlipemia   . Organic erectile dysfunction   . PAF (paroxysmal atrial fibrillation) ([place])    a. admx to [facility] in [place], [place] 10/16 with AF with RVR >> converted to NSR with IV Dilt;  b. Flecainide started >> ETT neg for pro-arrhythmia   . Prostate cancer ([place]) 04/25/2006   Gleason 3+4=7  . Prostatitis   . S/P cardiac cath    a. LHC at [facility] 10/16:  LM ok, mLAD 30%, LCx ok, dRCA 20%  . S/P radiation therapy 01/19/2014 through 03/05/2014                                                      Prostate bed 6600 cGy in 33 sessions                          . Torn rotator cuff    right  40% tear        Past Surgical History:  Procedure Laterality Date  . APPENDECTOMY     1967  . cataract surgery Left   . COLONOSCOPY    . KNEE ARTHROSCOPY     R  . LARYNX SURGERY     vocal cord lesion- benign  . LUMBAR LAMINECTOMY/DECOMPRESSION MICRODISCECTOMY Right 07/26/2017   Procedure: RIGHT LUMBAR 3- LUMBAR 4 FORAMINOTOMY WITH RESECTION OF SYNOVIAL CYST;  Surgeon: Erline Levine, MD;  Location: [place];  Service:  Neurosurgery;  Laterality: Right;  RIGHT LUMBAR 3- LUMBAR 4 FORAMINOTOMY WITH RESECTION OF SYNOVIAL CYST  . POLYPECTOMY    . PROSTATECTOMY  04/25/2006    ROS- all systems are reviewed and negatives except as per HPI above        Current Outpatient Medications  Medication Sig Dispense Refill  . acetaminophen (TYLENOL) 500 MG tablet Take 500 mg by mouth every 6 (six) hours as needed for mild pain.    Marland Kitchen albuterol (PROVENTIL HFA;VENTOLIN HFA) 108 (90 BASE) MCG/ACT inhaler Inhale 2 puffs into the lungs every 6 (six) hours as needed for wheezing or shortness of breath.     . Alprostadil (PROSTAGLANDIN E1) POWD 1 mL by Does not apply route as needed (FOR ERECTILE DYSFUNCTION).     Marland Kitchen apixaban (ELIQUIS) 5 MG TABS tablet Take 10 mg bid x 7 days; then take 5 mg bid thereafter; DVT treatment    . atorvastatin (LIPITOR) 20  MG tablet Take 20 mg by mouth daily.     . cholecalciferol (VITAMIN D) 1000 UNITS tablet Take 1,000 Units by mouth daily.    Marland Kitchen diltiazem (CARDIZEM CD) 180 MG 24 hr capsule Take 1 capsule (180 mg total) by mouth daily. 30 capsule 0  . diltiazem (CARDIZEM) 30 MG tablet Take 1 tablet every 4 hours AS NEEDED for afib rapid heart rate over 100 (Patient taking differently: Take 30-60 mg by mouth every 4 (four) hours as needed (for AFIB for heart rate > 100). ) 45 tablet 2  . flecainide (TAMBOCOR) 150 MG tablet Take 0.5 tablets (75 mg total) by mouth 2 (two) times daily. 90 tablet 2  . fluticasone (FLONASE) 50 MCG/ACT nasal spray Place 1 spray into both nostrils daily.     . Glucosamine-Chondroit-Vit C-Mn (GLUCOSAMINE 1500 COMPLEX) CAPS Take 1 capsule by mouth daily.    Marland Kitchen HYDROcodone-acetaminophen (NORCO/VICODIN) 5-325 MG tablet Take 1-2 tablets by mouth every 6 (six) hours as needed for severe pain. 60 tablet 0  . L-Lysine 500 MG TABS Take 500 mg by mouth daily.     . methocarbamol (ROBAXIN) 500 MG tablet Take 1 tablet (500 mg total) by mouth every 6 (six) hours as needed  for muscle spasms. 60 tablet 1  . Mometasone Furoate (ASMANEX HFA) 100 MCG/ACT AERO Inhale 1 puff into the lungs daily.     . montelukast (SINGULAIR) 10 MG tablet Take 10 mg by mouth at bedtime.    . Multiple Vitamin (MULTIVITAMIN) capsule Take 1 capsule by mouth daily.    . Naphazoline-Glycerin (REDNESS RELIEF OP) Place 2 drops into both eyes 4 (four) times daily as needed (for irritation).    Marland Kitchen omeprazole (PRILOSEC) 20 MG capsule Take 20 mg by mouth 2 (two) times daily before a meal.     . predniSONE (DELTASONE) 20 MG tablet Take 10-20 mg by mouth as directed. Uses for asthma flare up  0  . Probiotic Product (ALIGN) 4 MG CAPS Take 4 mg by mouth daily.     . psyllium (METAMUCIL) 58.6 % powder Take 1 packet by mouth 2 (two) times daily.    . vitamin C (ASCORBIC ACID) 500 MG tablet Take 500 mg by mouth daily.    Marland Kitchen zolpidem (AMBIEN) 10 MG tablet Take 10 mg by mouth at bedtime as needed for sleep.     No current facility-administered medications for this visit.     Physical Exam: Vitals:   03/28/18 0735  BP: 116/75  Pulse: (!) 57  Temp: (!) 97.3 F (36.3 C)  SpO2: 98%    GEN- The patient is well appearing, alert and oriented x 3 today.   Head- normocephalic, atraumatic Eyes-  Sclera clear, conjunctiva pink Ears- hearing intact Oropharynx- clear Lungs- Clear to ausculation bilaterally, normal work of breathing Heart- Regular rate and rhythm, no murmurs, rubs or gallops, PMI not laterally displaced GI- soft, NT, ND, + BS Extremities- no clubbing, cyanosis, or edema     Wt Readings from Last 3 Encounters:  02/18/18 214 lb 3.2 oz (97.2 kg)  02/11/18 210 lb (95.3 kg)  08/27/17 214 lb (97.1 kg)    Assessment and Plan:  1. Paroxysmal atrial fibrillation The patient has symptomatic, recurrent paroxysmal atrial fibrillation. he has failed medical therapy with flecainide. Chads2vasc score is 1.  he is anticoagulated with eliquis . Therapeutic strategies  for afib including medicine and ablation were discussed in detail with the patient today. Risk, benefits, and alternatives to EP study and  radiofrequency ablation for afib were also discussed in detail today. These risks include but are not limited to stroke, bleeding, vascular damage, tamponade, perforation, damage to the esophagus, lungs, and other structures, pulmonary vein stenosis, worsening renal function, and death. The patient understands these risk and wishes to proceed.  We will therefore proceed with catheter ablation at this time.  Cardiac CT is reviewed with him today.  He is informed of aortic enlargement and is advised to follow-up with Dr Percival Spanish for repeat imaging in a year as advised.  He reports compliance with eliquis without interruption.  [name] MD, [place] 03/28/2018 10:08 AM

## 2018-03-28 NOTE — Transfer of Care (Signed)
Immediate Anesthesia Transfer of Care Note  Patient: Brandon Simmons.  Procedure(s) Performed: ATRIAL FIBRILLATION ABLATION (N/A )  Patient Location: PACU and Cath Lab  Anesthesia Type:General  Level of Consciousness: patient cooperative and responds to stimulation  Airway & Oxygen Therapy: Patient Spontanous Breathing and Patient connected to nasal cannula oxygen  Post-op Assessment: Report given to RN, Post -op Vital signs reviewed and stable and Patient moving all extremities X 4  Post vital signs: Reviewed and stable  Last Vitals:  Vitals Value Taken Time  BP 104/62 03/28/2018  1:53 PM  Temp 36.5 C 03/28/2018  1:48 PM  Pulse 65 03/28/2018  1:55 PM  Resp 14 03/28/2018  1:55 PM  SpO2 99 % 03/28/2018  1:55 PM  Vitals shown include unvalidated device data.  Last Pain:  Vitals:   03/28/18 1348  TempSrc:   PainSc: 0-No pain         Complications: No apparent anesthesia complications

## 2018-03-28 NOTE — Progress Notes (Signed)
Patient stated having 4/10 chest pain in center of chest. Patients vitals 124/75, 99% RA,  And HR 66. EKG in file, not changes Normal sinus rhythm.  MD gave orders to treat chest pain. Gave nitro sublingual x1. Chest pain 1/10 after nitro. Patients stated "feeling" better. Patient still in SR, vitals 118/83, HR 66, 100% RA. Patient stated feeling anxious, MD ordered sleep medication. Will continue to monitor patient closely.

## 2018-03-28 NOTE — Progress Notes (Signed)
Patient c/o feeling fluttering in chest as he had with afib.  Reviewed strips, had 30 beat run of SVT and returned to [place].  On call MD paged.

## 2018-03-28 NOTE — Anesthesia Preprocedure Evaluation (Addendum)
Anesthesia Evaluation  Patient identified by MRN, date of birth, ID band Patient awake    Reviewed: Allergy & Precautions, NPO status , Patient's Chart, lab work & pertinent test results  History of Anesthesia Complications Negative for: history of anesthetic complications  Airway Mallampati: II  TM Distance: >3 FB Neck ROM: Full    Dental  (+) Dental Advisory Given, Teeth Intact   Pulmonary COPD,  COPD inhaler, former smoker,    breath sounds clear to auscultation       Cardiovascular hypertension, Pt. on medications (-) angina+ CAD ('16 cath: non-obstructive)  + dysrhythmias Atrial Fibrillation  Rhythm:Irregular Rate:Normal  7/19 ECHO: EF 50% to 55%. Wall motion was normal; Mild AI. Mitral valve: Systolic bowing without prolapse. Mild ascending aorta dilation    Neuro/Psych negative neurological ROS     GI/Hepatic Neg liver ROS, GERD  Medicated and Controlled,  Endo/Other  obesity  Renal/GU negative Renal ROS   Prostate cancer    Musculoskeletal  (+) Arthritis ,   Abdominal (+) + obese,   Peds  Hematology eliquis   Anesthesia Other Findings   Reproductive/Obstetrics                            Anesthesia Physical Anesthesia Plan  ASA: III  Anesthesia Plan: General   Post-op Pain Management:    Induction: Intravenous  PONV Risk Score and Plan: 2 and Ondansetron and Dexamethasone  Airway Management Planned: Oral ETT  Additional Equipment:   Intra-op Plan:   Post-operative Plan: Extubation in OR  Informed Consent: I have reviewed the patients History and Physical, chart, labs and discussed the procedure including the risks, benefits and alternatives for the proposed anesthesia with the patient or authorized representative who has indicated his/her understanding and acceptance.   Dental advisory given  Plan Discussed with: CRNA and Surgeon  Anesthesia Plan Comments:  (Plan routine monitors, GETA)        Anesthesia Quick Evaluation

## 2018-03-28 NOTE — Discharge Summary (Signed)
ELECTROPHYSIOLOGY PROCEDURE DISCHARGE SUMMARY    Patient ID: Brandon Platz.,  MRN: [id number], DOB/AGE: 11-11-1945 73 y.o.  Admit date: 03/28/2018 Discharge date: 03/29/2018  Primary Care Physician: Leanna Battles, MD  Primary Cardiologist: Dr. Percival Spanish Electrophysiologist: [name], MD  Primary Discharge Diagnosis:  1. Paroxysmal AFib     CHA2DS2Vasc is one, on Eliquis, appropriately dosed  Secondary Discharge Diagnosis:  1. HLD  Procedures This Admission:  1.  Electrophysiology study and radiofrequency catheter ablation on 03/28/2018 by Dr [name].   This study demonstrated    CONCLUSIONS: 1. Sinus rhythm upon presentation.   2. Intracardiac echo reveals a moderate sized left atrium with four separate pulmonary veins without evidence of pulmonary vein stenosis. 3. Successful electrical isolation and anatomical encircling of all four pulmonary veins with radiofrequency current. 4. No inducible arrhythmias following ablation both on and off of Isuprel 5. No early apparent complications.    Brief HPI: Brandon Gust Eugene. is a 73 y.o. male with a history of paroxysmal Afib atrial fibrillation.  He failed medical therapy with Flecainide. Risks, benefits, and alternatives to catheter ablation of atrial fibrillation were reviewed with the patient who wished to proceed.  The patient underwent cardiac CT prior to the procedure which demonstrated no LAA thrombus.    Hospital Course:  The patient was admitted and underwent EPS/RFCA of atrial fibrillation with details as outlined above.  They were monitored on telemetry overnight which demonstrated sinus rhythm with very brief nonsusatined afib.   R groin was without complication on the day of discharge.  The patient  feels well, denies any CP or SOB, no procedure site pain, he was examined by Dr.  Rayann Heman and considered to be stable for discharge.  Wound care and restrictions were reviewed with the patient.  The patient will  be seen back by Roderic Palau, NP in 4 weeks and Dr Rayann Heman in 12 weeks for post ablation follow up.    CT rec annual Ao imaging to be followed with Dr Percival Spanish    Physical Exam: Vitals:   03/28/18 2011 03/28/18 2024 03/29/18 0132 03/29/18 0627  BP: 118/73 106/77 113/68 109/68  Pulse: 66 68 73 79  Resp: 17 20 (!) 22 19  Temp:  97.8 F (36.6 C)  97.8 F (36.6 C)  TempSrc:  Oral  Oral  SpO2: 100% 98% 98% 97%  Weight:    97.9 kg  Height:        GEN- The patient is well appearing, alert and oriented x 3 today.   HEENT: normocephalic, atraumatic; sclera clear, conjunctiva pink; hearing intact; oropharynx clear; neck supple  Lungs-  CTA b/l, normal work of breathing.  No wheezes, rales, rhonchi Heart-  RRR, no murmurs, rubs or gallops  GI- soft, non-tender, non-distended  Extremities- no clubbing, cyanosis, or edema; DP/PT 2+ bilaterally,  R groin without hematoma/bruit MS- no significant deformity or atrophy Skin- warm and dry, no rash or lesion Psych- euthymic mood, full affect Neuro- strength and sensation are intact   Labs:   Lab Results  Component Value Date   WBC 5.8 03/14/2018   HGB 15.1 03/14/2018   HCT 45.3 03/14/2018   MCV 95.0 03/14/2018   PLT 170 03/14/2018   No results for input(s): NA, K, CL, CO2, BUN, CREATININE, CALCIUM, PROT, BILITOT, ALKPHOS, ALT, AST, GLUCOSE in the last 168 hours.  Invalid input(s): LABALBU   Discharge Medications:  Allergies as of 03/29/2018   No Known Allergies  Medication List    TAKE these medications   acetaminophen 500 MG tablet Commonly known as:  TYLENOL Take 500 mg by mouth every 6 (six) hours as needed for mild pain.   albuterol 108 (90 Base) MCG/ACT inhaler Commonly known as:  PROVENTIL HFA;VENTOLIN HFA Inhale 2 puffs into the lungs every 6 (six) hours as needed for wheezing or shortness of breath.   ALIGN 4 MG Caps Take 4 mg by mouth daily.   ASMANEX HFA 100 MCG/ACT Aero Generic drug:  Mometasone  Furoate Inhale 1 puff into the lungs daily.   atorvastatin 20 MG tablet Commonly known as:  LIPITOR Take 20 mg by mouth daily.   cholecalciferol 1000 units tablet Commonly known as:  VITAMIN D Take 1,000 Units by mouth daily.   diltiazem 180 MG 24 hr capsule Commonly known as:  CARDIZEM CD Take 1 capsule (180 mg total) by mouth daily.   diltiazem 30 MG tablet Commonly known as:  CARDIZEM Take 1 tablet every 4 hours AS NEEDED for afib rapid heart rate over 100 What changed:    how much to take  how to take this  when to take this  reasons to take this  additional instructions   ELIQUIS 5 MG Tabs tablet Generic drug:  apixaban Take 5 mg by mouth 2 (two) times daily.   flecainide 100 MG tablet Commonly known as:  TAMBOCOR Take 1 tablet (100 mg total) by mouth 2 (two) times daily.   fluticasone 50 MCG/ACT nasal spray Commonly known as:  FLONASE Place 1 spray into both nostrils daily.   GLUCOSAMINE 1500 COMPLEX Caps Take 1 capsule by mouth daily.   HYDROcodone-acetaminophen 5-325 MG tablet Commonly known as:  NORCO/VICODIN Take 1-2 tablets by mouth every 6 (six) hours as needed for severe pain.   L-Lysine 500 MG Tabs Take 500 mg by mouth daily.   methocarbamol 500 MG tablet Commonly known as:  ROBAXIN Take 1 tablet (500 mg total) by mouth every 6 (six) hours as needed for muscle spasms.   montelukast 10 MG tablet Commonly known as:  SINGULAIR Take 10 mg by mouth at bedtime.   multivitamin capsule Take 1 capsule by mouth daily.   omeprazole 20 MG capsule Commonly known as:  PRILOSEC Take 20 mg by mouth 2 (two) times daily before a meal.   predniSONE 20 MG tablet Commonly known as:  DELTASONE Take 10-20 mg by mouth as directed. Uses for asthma flare up   Prostaglandin E1 Powd 1 mL by Other route as needed (FOR ERECTILE DYSFUNCTION).   psyllium 58.6 % powder Commonly known as:  METAMUCIL Take 1 packet by mouth 2 (two) times daily.   REDNESS RELIEF  OP Place 2 drops into both eyes 4 (four) times daily as needed (for irritation).   vitamin C 500 MG tablet Commonly known as:  ASCORBIC ACID Take 500 mg by mouth daily.   zolpidem 10 MG tablet Commonly known as:  AMBIEN Take 10 mg by mouth at bedtime as needed for sleep.       Disposition:  Home   Follow-up Information    [facility] Follow up.   Specialty:  Cardiology Why:  04/26/2018 @ 11:30AM Contact information: [id number] [place] [address] [phone]       [name], MD Follow up.   Specialty:  Cardiology Why:  06/19/2018 @ 9:45AM Contact information: [place]  [address] [phone]  Duration of Discharge Encounter: Greater than 30 minutes including physician time.  Army Fossa MD 03/29/2018 7:24 AM

## 2018-03-28 NOTE — Anesthesia Procedure Notes (Signed)
Procedure Name: Intubation Performed by: Verdie Drown, CRNA Pre-anesthesia Checklist: Patient identified, Emergency Drugs available, Suction available and Patient being monitored Patient Re-evaluated:Patient Re-evaluated prior to induction Oxygen Delivery Method: Circle System Utilized Preoxygenation: Pre-oxygenation with 100% oxygen Induction Type: IV induction Ventilation: Mask ventilation without difficulty and Oral airway inserted - appropriate to patient size Laryngoscope Size: Mac and 3 Grade View: Grade II Tube type: Oral Tube size: 7.5 mm Number of attempts: 1 Airway Equipment and Method: Stylet and Oral airway Placement Confirmation: ETT inserted through vocal cords under direct vision,  positive ETCO2 and breath sounds checked- equal and bilateral Secured at: 23 cm Tube secured with: Tape Dental Injury: Teeth and Oropharynx as per pre-operative assessment

## 2018-03-29 ENCOUNTER — Encounter (HOSPITAL_COMMUNITY): Payer: Self-pay | Admitting: Internal Medicine

## 2018-03-29 DIAGNOSIS — Z79899 Other long term (current) drug therapy: Secondary | ICD-10-CM | POA: Diagnosis not present

## 2018-03-29 DIAGNOSIS — I480 Paroxysmal atrial fibrillation: Secondary | ICD-10-CM

## 2018-03-29 DIAGNOSIS — M1990 Unspecified osteoarthritis, unspecified site: Secondary | ICD-10-CM | POA: Diagnosis not present

## 2018-03-29 DIAGNOSIS — E785 Hyperlipidemia, unspecified: Secondary | ICD-10-CM | POA: Diagnosis not present

## 2018-03-29 DIAGNOSIS — Z7951 Long term (current) use of inhaled steroids: Secondary | ICD-10-CM | POA: Diagnosis not present

## 2018-03-29 DIAGNOSIS — K219 Gastro-esophageal reflux disease without esophagitis: Secondary | ICD-10-CM | POA: Diagnosis not present

## 2018-03-29 DIAGNOSIS — Z7901 Long term (current) use of anticoagulants: Secondary | ICD-10-CM | POA: Diagnosis not present

## 2018-03-29 MED FILL — Heparin Sod (Porcine)-NaCl IV Soln 1000 Unit/500ML-0.9%: INTRAVENOUS | Qty: 500 | Status: AC

## 2018-04-03 DIAGNOSIS — C61 Malignant neoplasm of prostate: Secondary | ICD-10-CM | POA: Diagnosis not present

## 2018-04-08 DIAGNOSIS — J301 Allergic rhinitis due to pollen: Secondary | ICD-10-CM | POA: Diagnosis not present

## 2018-04-08 DIAGNOSIS — J3089 Other allergic rhinitis: Secondary | ICD-10-CM | POA: Diagnosis not present

## 2018-04-12 DIAGNOSIS — N5201 Erectile dysfunction due to arterial insufficiency: Secondary | ICD-10-CM | POA: Diagnosis not present

## 2018-04-12 DIAGNOSIS — C61 Malignant neoplasm of prostate: Secondary | ICD-10-CM | POA: Diagnosis not present

## 2018-04-16 DIAGNOSIS — J4530 Mild persistent asthma, uncomplicated: Secondary | ICD-10-CM | POA: Diagnosis not present

## 2018-04-16 DIAGNOSIS — J301 Allergic rhinitis due to pollen: Secondary | ICD-10-CM | POA: Diagnosis not present

## 2018-04-16 DIAGNOSIS — J3089 Other allergic rhinitis: Secondary | ICD-10-CM | POA: Diagnosis not present

## 2018-04-25 ENCOUNTER — Ambulatory Visit (HOSPITAL_COMMUNITY): Payer: Medicare Other | Admitting: Nurse Practitioner

## 2018-04-26 ENCOUNTER — Encounter (HOSPITAL_COMMUNITY): Payer: Self-pay | Admitting: Nurse Practitioner

## 2018-04-26 ENCOUNTER — Ambulatory Visit (HOSPITAL_COMMUNITY)
Admission: RE | Admit: 2018-04-26 | Discharge: 2018-04-26 | Disposition: A | Discharge status: Home / Self Care | Payer: Medicare Other | Source: Ambulatory Visit | Attending: Nurse Practitioner | Admitting: Nurse Practitioner

## 2018-04-26 VITALS — BP 122/64 | HR 60 | Ht 70.0 in | Wt 216.0 lb

## 2018-04-26 DIAGNOSIS — M1990 Unspecified osteoarthritis, unspecified site: Secondary | ICD-10-CM | POA: Diagnosis not present

## 2018-04-26 DIAGNOSIS — Z7901 Long term (current) use of anticoagulants: Secondary | ICD-10-CM | POA: Insufficient documentation

## 2018-04-26 DIAGNOSIS — Z79899 Other long term (current) drug therapy: Secondary | ICD-10-CM | POA: Insufficient documentation

## 2018-04-26 DIAGNOSIS — J3089 Other allergic rhinitis: Secondary | ICD-10-CM | POA: Diagnosis not present

## 2018-04-26 DIAGNOSIS — Z836 Family history of other diseases of the respiratory system: Secondary | ICD-10-CM | POA: Insufficient documentation

## 2018-04-26 DIAGNOSIS — Z8249 Family history of ischemic heart disease and other diseases of the circulatory system: Secondary | ICD-10-CM | POA: Diagnosis not present

## 2018-04-26 DIAGNOSIS — I480 Paroxysmal atrial fibrillation: Secondary | ICD-10-CM | POA: Diagnosis not present

## 2018-04-26 DIAGNOSIS — Z833 Family history of diabetes mellitus: Secondary | ICD-10-CM | POA: Diagnosis not present

## 2018-04-26 DIAGNOSIS — N529 Male erectile dysfunction, unspecified: Secondary | ICD-10-CM | POA: Diagnosis not present

## 2018-04-26 DIAGNOSIS — Z7952 Long term (current) use of systemic steroids: Secondary | ICD-10-CM | POA: Diagnosis not present

## 2018-04-26 DIAGNOSIS — E785 Hyperlipidemia, unspecified: Secondary | ICD-10-CM | POA: Insufficient documentation

## 2018-04-26 DIAGNOSIS — Z8546 Personal history of malignant neoplasm of prostate: Secondary | ICD-10-CM | POA: Diagnosis not present

## 2018-04-26 DIAGNOSIS — J45909 Unspecified asthma, uncomplicated: Secondary | ICD-10-CM | POA: Diagnosis not present

## 2018-04-26 DIAGNOSIS — Z87891 Personal history of nicotine dependence: Secondary | ICD-10-CM | POA: Insufficient documentation

## 2018-04-26 DIAGNOSIS — Z801 Family history of malignant neoplasm of trachea, bronchus and lung: Secondary | ICD-10-CM | POA: Diagnosis not present

## 2018-04-26 DIAGNOSIS — K219 Gastro-esophageal reflux disease without esophagitis: Secondary | ICD-10-CM | POA: Insufficient documentation

## 2018-04-26 DIAGNOSIS — R9431 Abnormal electrocardiogram [ECG] [EKG]: Secondary | ICD-10-CM | POA: Diagnosis not present

## 2018-04-26 DIAGNOSIS — Z800 Family history of malignant neoplasm of digestive organs: Secondary | ICD-10-CM | POA: Insufficient documentation

## 2018-04-26 DIAGNOSIS — J301 Allergic rhinitis due to pollen: Secondary | ICD-10-CM | POA: Diagnosis not present

## 2018-04-26 NOTE — Progress Notes (Signed)
Primary Care Physician: Leanna Battles, MD Referring Physician:Dr. Geroge Baseman Donn Zanetti. is a 73 y.o. male with a h/o paroxysmal afib that is in the afib clinic to f/u ablation that he had one month ago. He has had one episode of afib. He is in SR today and continues to take flecainide. No swallowing or groin issues. Feels good about his ablation and hopefully the long term abstinence of afib.  Today, he denies symptoms of palpitations, chest pain, shortness of breath, orthopnea, PND, lower extremity edema, dizziness, presyncope, syncope, or neurologic sequela. The patient is tolerating medications without difficulties and is otherwise without complaint today.   Past Medical History:  Diagnosis Date  . Arthritis   . Asthma   . Diverticulitis   . Diverticulosis   . GERD (gastroesophageal reflux disease)   . Hemorrhoids   . History of echocardiogram    a. Echo 10/16 [facility]):  EF 55%, trace MR, mild TR, mild to mod AI, mild dilated Ao root (41 mm)  . Hyperlipemia   . Organic erectile dysfunction   . PAF (paroxysmal atrial fibrillation) ([place])    a. admx to [facility] in [place], [place] 10/16 with AF with RVR >> converted to NSR with IV Dilt;  b. Flecainide started >> ETT neg for pro-arrhythmia   . Prostate cancer ([place]) 04/25/2006   Gleason 3+4=7  . Prostatitis   . S/P cardiac cath    a. LHC at [facility] 10/16:  LM ok, mLAD 30%, LCx ok, dRCA 20%  . S/P radiation therapy 01/19/2014 through 03/05/2014                                                      Prostate bed 6600 cGy in 33 sessions                          . Torn rotator cuff    right  40% tear   Past Surgical History:  Procedure Laterality Date  . ABLATION OF DYSRHYTHMIC FOCUS  03/28/2018  . APPENDECTOMY     1967  . ATRIAL FIBRILLATION ABLATION N/A 03/28/2018   Procedure: ATRIAL FIBRILLATION ABLATION;  Surgeon: [name], MD;  Location: [facility];  Service: Cardiovascular;  Laterality:  N/A;  . cataract surgery Left   . COLONOSCOPY    . KNEE ARTHROSCOPY     R  . LARYNX SURGERY     vocal cord lesion- benign  . LUMBAR LAMINECTOMY/DECOMPRESSION MICRODISCECTOMY Right 07/26/2017   Procedure: RIGHT LUMBAR 3- LUMBAR 4 FORAMINOTOMY WITH RESECTION OF SYNOVIAL CYST;  Surgeon: Erline Levine, MD;  Location: [facility];  Service: Neurosurgery;  Laterality: Right;  RIGHT LUMBAR 3- LUMBAR 4 FORAMINOTOMY WITH RESECTION OF SYNOVIAL CYST  . POLYPECTOMY    . PROSTATECTOMY  04/25/2006    Current Outpatient Medications  Medication Sig Dispense Refill  . acetaminophen (TYLENOL) 500 MG tablet Take 500 mg by mouth every 6 (six) hours as needed for mild pain.    Marland Kitchen albuterol (PROVENTIL HFA;VENTOLIN HFA) 108 (90 BASE) MCG/ACT inhaler Inhale 2 puffs into the lungs every 6 (six) hours as needed for wheezing or shortness of breath.     . Alprostadil (PROSTAGLANDIN E1) POWD 1 mL by Other route as needed (FOR ERECTILE DYSFUNCTION).     Marland Kitchen  apixaban (ELIQUIS) 5 MG TABS tablet Take 5 mg by mouth 2 (two) times daily.     Marland Kitchen atorvastatin (LIPITOR) 20 MG tablet Take 20 mg by mouth daily.     . cholecalciferol (VITAMIN D) 1000 UNITS tablet Take 1,000 Units by mouth daily.    Marland Kitchen diltiazem (CARDIZEM CD) 180 MG 24 hr capsule Take 1 capsule (180 mg total) by mouth daily. 90 capsule 3  . diltiazem (CARDIZEM) 30 MG tablet Take 1 tablet every 4 hours AS NEEDED for afib rapid heart rate over 100 (Patient taking differently: Take 30-60 mg by mouth daily as needed (for AFIB for heart rate > 100). ) 45 tablet 2  . flecainide (TAMBOCOR) 100 MG tablet Take 1 tablet (100 mg total) by mouth 2 (two) times daily. 60 tablet 3  . fluticasone (FLONASE) 50 MCG/ACT nasal spray Place 1 spray into both nostrils daily.     . Glucosamine-Chondroit-Vit C-Mn (GLUCOSAMINE 1500 COMPLEX) CAPS Take 1 capsule by mouth daily.    Marland Kitchen HYDROcodone-acetaminophen (NORCO/VICODIN) 5-325 MG tablet Take 1-2 tablets by mouth every 6 (six) hours as needed for severe  pain. 60 tablet 0  . L-Lysine 500 MG TABS Take 500 mg by mouth daily.     . methocarbamol (ROBAXIN) 500 MG tablet Take 1 tablet (500 mg total) by mouth every 6 (six) hours as needed for muscle spasms. 60 tablet 1  . Mometasone Furoate (ASMANEX HFA) 100 MCG/ACT AERO Inhale 1 puff into the lungs daily.     . montelukast (SINGULAIR) 10 MG tablet Take 10 mg by mouth at bedtime.    . Multiple Vitamin (MULTIVITAMIN) capsule Take 1 capsule by mouth daily.    . Naphazoline-Glycerin (REDNESS RELIEF OP) Place 2 drops into both eyes 4 (four) times daily as needed (for irritation).    Marland Kitchen omeprazole (PRILOSEC) 20 MG capsule Take 20 mg by mouth 2 (two) times daily before a meal.     . Probiotic Product (ALIGN) 4 MG CAPS Take 4 mg by mouth daily.     . psyllium (METAMUCIL) 58.6 % powder Take 1 packet by mouth 2 (two) times daily.    . vitamin C (ASCORBIC ACID) 500 MG tablet Take 500 mg by mouth daily.    Marland Kitchen zolpidem (AMBIEN) 10 MG tablet Take 10 mg by mouth at bedtime as needed for sleep.    . predniSONE (DELTASONE) 20 MG tablet Take 10-20 mg by mouth as directed. Uses for asthma flare up  0   No current facility-administered medications for this encounter.     No Known Allergies  Social History   Socioeconomic History  . Marital status: Married    Spouse name: Not on file  . Number of children: 2  . Years of education: Not on file  . Highest education level: Not on file  Occupational History  . 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 Occupation: Optometrist  Social Needs  . Financial resource strain: Not on file  . Food insecurity:    Worry: Not on file    Inability: Not on file  . Transportation needs:    Medical: Not on file    Non-medical: Not on file  Tobacco Use  . Smoking status: Former Smoker    Packs/day: 1.00    Years: 10.00    Pack years: 10.00    Types: Cigarettes  . Smokeless tobacco: Former Systems developer  . Tobacco comment: 1972  Substance and Sexual Activity  . Alcohol use: Yes     Alcohol/week: 7.0 standard  drinks    Types: 7 Glasses of wine per week    Comment: 1 glass wine or beer daily  . Drug use: No  . Sexual activity: Not on file  Lifestyle  . Physical activity:    Days per week: Not on file    Minutes per session: Not on file  . Stress: Not on file  Relationships  . Social connections:    Talks on phone: Not on file    Gets together: Not on file    Attends religious service: Not on file    Active member of club or organization: Not on file    Attends meetings of clubs or organizations: Not on file    Relationship status: Not on file  . Intimate partner violence:    Fear of current or ex partner: Not on file    Emotionally abused: Not on file    Physically abused: Not on file    Forced sexual activity: Not on file  Other Topics Concern  . Not on file  Social History Narrative   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  Consultant for schools.   Travels over seas     Family History  Problem Relation Age of Onset  . Emphysema Father   . Lung cancer Father   . Hypertension Mother   . Diabetes Maternal Grandmother   . Heart disease Maternal Grandfather        Died age 11  . Liver cancer Paternal Grandmother   . Heart disease Paternal Grandfather        Died age 64  . Liver cancer Other   . Lung cancer Other   . Atrial fibrillation Son     ROS- All systems are reviewed and negative except as per the HPI above  Physical Exam: Vitals:   04/26/18 1125  BP: 122/64  Pulse: 60  Weight: 98 kg  Height: 5\' 10"  (1.778 m)   Wt Readings from Last 3 Encounters:  04/26/18 98 kg  03/29/18 97.9 kg  02/18/18 97.2 kg    Labs: Lab Results  Component Value Date   NA 141 03/14/2018   K 3.6 03/14/2018   CL 110 03/14/2018   CO2 25 03/14/2018   GLUCOSE 125 (H) 03/14/2018   BUN 13 03/14/2018   CREATININE 1.04 03/14/2018   CALCIUM 9.1 03/14/2018   MG 2.2 02/11/2018   Lab Results  Component Value Date   INR 1.04 02/11/2018   No results found for: CHOL,  HDL, LDLCALC, TRIG   GEN- The patient is well appearing, alert and oriented x 3 today.   Head- normocephalic, atraumatic Eyes-  Sclera clear, conjunctiva pink Ears- hearing intact Oropharynx- clear Neck- supple, no JVP Lymph- no cervical lymphadenopathy Lungs- Clear to ausculation bilaterally, normal work of breathing Heart- Regular rate and rhythm, no murmurs, rubs or gallops, PMI not laterally displaced GI- soft, NT, ND, + BS Extremities- no clubbing, cyanosis, or edema MS- no significant deformity or atrophy Skin- no rash or lesion Psych- euthymic mood, full affect Neuro- strength and sensation are intact  EKG-NSR at 80 bpm, PR int 180 ms, qrs int 94 ms, qtc 408 ms Epic records reviewed    Assessment and Plan: 1. Paroxysmal afib One month s/p ablation Doing well staying in SR Continue flecainide 100 mg bid  Continue cardizem at 180 mg daily  2. Chadsvasc score of 3(age and DVT after back surgery 2019) Continue  eliquis 5 mg bid, reminded not interrupt anticoagulation for the 3 month recovery  period  F/u with Dr. Rayann Heman 4/1  Geroge Baseman. Maryrose Colvin, [facility] [address] [place],  [address] [phone]

## 2018-05-01 DIAGNOSIS — D485 Neoplasm of uncertain behavior of skin: Secondary | ICD-10-CM | POA: Diagnosis not present

## 2018-05-01 DIAGNOSIS — D225 Melanocytic nevi of trunk: Secondary | ICD-10-CM | POA: Diagnosis not present

## 2018-05-01 DIAGNOSIS — L821 Other seborrheic keratosis: Secondary | ICD-10-CM | POA: Diagnosis not present

## 2018-05-01 DIAGNOSIS — D2262 Melanocytic nevi of left upper limb, including shoulder: Secondary | ICD-10-CM | POA: Diagnosis not present

## 2018-05-01 DIAGNOSIS — D2261 Melanocytic nevi of right upper limb, including shoulder: Secondary | ICD-10-CM | POA: Diagnosis not present

## 2018-05-01 DIAGNOSIS — D2272 Melanocytic nevi of left lower limb, including hip: Secondary | ICD-10-CM | POA: Diagnosis not present

## 2018-05-06 DIAGNOSIS — Z6832 Body mass index (BMI) 32.0-32.9, adult: Secondary | ICD-10-CM | POA: Diagnosis not present

## 2018-05-06 DIAGNOSIS — R05 Cough: Secondary | ICD-10-CM | POA: Diagnosis not present

## 2018-05-06 DIAGNOSIS — I4891 Unspecified atrial fibrillation: Secondary | ICD-10-CM | POA: Diagnosis not present

## 2018-05-13 DIAGNOSIS — J3089 Other allergic rhinitis: Secondary | ICD-10-CM | POA: Diagnosis not present

## 2018-05-13 DIAGNOSIS — J301 Allergic rhinitis due to pollen: Secondary | ICD-10-CM | POA: Diagnosis not present

## 2018-05-13 DIAGNOSIS — L988 Other specified disorders of the skin and subcutaneous tissue: Secondary | ICD-10-CM | POA: Diagnosis not present

## 2018-05-13 DIAGNOSIS — D485 Neoplasm of uncertain behavior of skin: Secondary | ICD-10-CM | POA: Diagnosis not present

## 2018-05-15 DIAGNOSIS — J3089 Other allergic rhinitis: Secondary | ICD-10-CM | POA: Diagnosis not present

## 2018-05-15 DIAGNOSIS — J301 Allergic rhinitis due to pollen: Secondary | ICD-10-CM | POA: Diagnosis not present

## 2018-05-20 DIAGNOSIS — R82998 Other abnormal findings in urine: Secondary | ICD-10-CM | POA: Diagnosis not present

## 2018-05-20 DIAGNOSIS — Z125 Encounter for screening for malignant neoplasm of prostate: Secondary | ICD-10-CM | POA: Diagnosis not present

## 2018-05-20 DIAGNOSIS — E7849 Other hyperlipidemia: Secondary | ICD-10-CM | POA: Diagnosis not present

## 2018-05-24 DIAGNOSIS — Z0000 Encounter for general adult medical examination without abnormal findings: Secondary | ICD-10-CM | POA: Diagnosis not present

## 2018-05-24 DIAGNOSIS — Z1331 Encounter for screening for depression: Secondary | ICD-10-CM | POA: Diagnosis not present

## 2018-05-24 DIAGNOSIS — Z7901 Long term (current) use of anticoagulants: Secondary | ICD-10-CM | POA: Diagnosis not present

## 2018-05-24 DIAGNOSIS — Z1339 Encounter for screening examination for other mental health and behavioral disorders: Secondary | ICD-10-CM | POA: Diagnosis not present

## 2018-05-24 DIAGNOSIS — C61 Malignant neoplasm of prostate: Secondary | ICD-10-CM | POA: Diagnosis not present

## 2018-05-24 DIAGNOSIS — M5416 Radiculopathy, lumbar region: Secondary | ICD-10-CM | POA: Diagnosis not present

## 2018-05-24 DIAGNOSIS — I82501 Chronic embolism and thrombosis of unspecified deep veins of right lower extremity: Secondary | ICD-10-CM | POA: Diagnosis not present

## 2018-05-24 DIAGNOSIS — E668 Other obesity: Secondary | ICD-10-CM | POA: Diagnosis not present

## 2018-05-24 DIAGNOSIS — E7849 Other hyperlipidemia: Secondary | ICD-10-CM | POA: Diagnosis not present

## 2018-05-24 DIAGNOSIS — J45998 Other asthma: Secondary | ICD-10-CM | POA: Diagnosis not present

## 2018-05-24 DIAGNOSIS — I2510 Atherosclerotic heart disease of native coronary artery without angina pectoris: Secondary | ICD-10-CM | POA: Diagnosis not present

## 2018-05-24 DIAGNOSIS — I4891 Unspecified atrial fibrillation: Secondary | ICD-10-CM | POA: Diagnosis not present

## 2018-05-28 DIAGNOSIS — Z1212 Encounter for screening for malignant neoplasm of rectum: Secondary | ICD-10-CM | POA: Diagnosis not present

## 2018-06-01 ENCOUNTER — Other Ambulatory Visit: Payer: Self-pay | Admitting: Internal Medicine

## 2018-06-04 DIAGNOSIS — J3089 Other allergic rhinitis: Secondary | ICD-10-CM | POA: Diagnosis not present

## 2018-06-04 DIAGNOSIS — J301 Allergic rhinitis due to pollen: Secondary | ICD-10-CM | POA: Diagnosis not present

## 2018-06-06 DIAGNOSIS — J301 Allergic rhinitis due to pollen: Secondary | ICD-10-CM | POA: Diagnosis not present

## 2018-06-06 DIAGNOSIS — J3089 Other allergic rhinitis: Secondary | ICD-10-CM | POA: Diagnosis not present

## 2018-06-10 DIAGNOSIS — J3089 Other allergic rhinitis: Secondary | ICD-10-CM | POA: Diagnosis not present

## 2018-06-10 DIAGNOSIS — J301 Allergic rhinitis due to pollen: Secondary | ICD-10-CM | POA: Diagnosis not present

## 2018-06-13 ENCOUNTER — Telehealth (INDEPENDENT_AMBULATORY_CARE_PROVIDER_SITE_OTHER): Payer: Self-pay | Admitting: Physical Medicine and Rehabilitation

## 2018-06-13 DIAGNOSIS — J301 Allergic rhinitis due to pollen: Secondary | ICD-10-CM | POA: Diagnosis not present

## 2018-06-13 DIAGNOSIS — J3089 Other allergic rhinitis: Secondary | ICD-10-CM | POA: Diagnosis not present

## 2018-06-13 NOTE — Telephone Encounter (Signed)
[place] patient canceled appt that was scheduled with Dr Ernestina Patches from 11/2017 as they were requesting the ov note

## 2018-06-17 ENCOUNTER — Telehealth: Payer: Self-pay

## 2018-06-17 NOTE — Telephone Encounter (Signed)
Spoke with pt regarding virtual visit on 06/19/18. Advise pt to check pulse and BP morning of appt. Pt stated he is unable upload EKG to MyChart. Pt questions and concerns were address.

## 2018-06-18 DIAGNOSIS — J3089 Other allergic rhinitis: Secondary | ICD-10-CM | POA: Diagnosis not present

## 2018-06-18 DIAGNOSIS — J301 Allergic rhinitis due to pollen: Secondary | ICD-10-CM | POA: Diagnosis not present

## 2018-06-19 ENCOUNTER — Telehealth (INDEPENDENT_AMBULATORY_CARE_PROVIDER_SITE_OTHER): Payer: Medicare Other | Admitting: Internal Medicine

## 2018-06-19 ENCOUNTER — Encounter: Payer: Self-pay | Admitting: Internal Medicine

## 2018-06-19 DIAGNOSIS — I77810 Thoracic aortic ectasia: Secondary | ICD-10-CM | POA: Diagnosis not present

## 2018-06-19 DIAGNOSIS — I480 Paroxysmal atrial fibrillation: Secondary | ICD-10-CM | POA: Diagnosis not present

## 2018-06-19 NOTE — Progress Notes (Signed)
Electrophysiology TeleHealth Note   Due to national recommendations of social distancing due to COVID 19, an audio/video telehealth visit is felt to be most appropriate for this patient at this time.  See MyChart message from today for the patient's consent to telehealth for [facility].   Date:  06/19/2018   ID:  Brandon Alt., DOB Mar 24, 1945, MRN [id number]  Location: patient's home  Provider location: [address], [place]  Evaluation Performed: Follow-up visit  PCP:  Leanna Battles, MD  Cardiologist:  Dr Percival Spanish Electrophysiologist:  Dr Rayann Heman  Chief Complaint:  afib  History of Present Illness:    Brandon Simmons. is a 73 y.o. male who presents via audio/video conferencing for a telehealth visit today.  Since last being seen in our clinic, the patient reports doing very well.  afib is well controlled post ablation.  He is pleased with results.  Denies procedure related complications.  Today, he denies symptoms of palpitations, chest pain, shortness of breath, dizziness, presyncope, or syncope.  The patient is otherwise without complaint today.  The patient denies symptoms of fevers, chills, cough, or new SOB worrisome for COVID 19.  Past Medical History:  Diagnosis Date  . Arthritis   . Asthma   . Diverticulitis   . Diverticulosis   . GERD (gastroesophageal reflux disease)   . Hemorrhoids   . History of echocardiogram    a. Echo 10/16 [facility]):  EF 55%, trace MR, mild TR, mild to mod AI, mild dilated Ao root (41 mm)  . Hyperlipemia   . Organic erectile dysfunction   . PAF (paroxysmal atrial fibrillation) ([place])    a. admx to [facility] in [place], [place] 10/16 with AF with RVR >> converted to NSR with IV Dilt;  b. Flecainide started >> ETT neg for pro-arrhythmia   . Prostate cancer ([place]) 04/25/2006   Gleason 3+4=7  . Prostatitis   . S/P cardiac cath    a. LHC at [facility] 10/16:  LM ok, mLAD 30%, LCx ok, dRCA 20%  . S/P  radiation therapy 01/19/2014 through 03/05/2014                                                      Prostate bed 6600 cGy in 33 sessions                          . Torn rotator cuff    right  40% tear    Past Surgical History:  Procedure Laterality Date  . ABLATION OF DYSRHYTHMIC FOCUS  03/28/2018  . APPENDECTOMY     1967  . ATRIAL FIBRILLATION ABLATION N/A 03/28/2018   Procedure: ATRIAL FIBRILLATION ABLATION;  Surgeon: [name], MD;  Location: [place] CV [facility];  Service: Cardiovascular;  Laterality: N/A;  . cataract surgery Left   . COLONOSCOPY    . KNEE ARTHROSCOPY     R  . LARYNX SURGERY     vocal cord lesion- benign  . LUMBAR LAMINECTOMY/DECOMPRESSION MICRODISCECTOMY Right 07/26/2017   Procedure: RIGHT LUMBAR 3- LUMBAR 4 FORAMINOTOMY WITH RESECTION OF SYNOVIAL CYST;  Surgeon: Erline Levine, MD;  Location: [place];  Service: Neurosurgery;  Laterality: Right;  RIGHT LUMBAR 3- LUMBAR 4 FORAMINOTOMY WITH RESECTION OF SYNOVIAL CYST  .  POLYPECTOMY    . PROSTATECTOMY  04/25/2006    Current Outpatient Medications  Medication Sig Dispense Refill  . acetaminophen (TYLENOL) 500 MG tablet Take 500 mg by mouth every 6 (six) hours as needed for mild pain.    Marland Kitchen albuterol (PROVENTIL HFA;VENTOLIN HFA) 108 (90 BASE) MCG/ACT inhaler Inhale 2 puffs into the lungs every 6 (six) hours as needed for wheezing or shortness of breath.     . Alprostadil (PROSTAGLANDIN E1) POWD 1 mL by Other route as needed (FOR ERECTILE DYSFUNCTION).     Marland Kitchen apixaban (ELIQUIS) 5 MG TABS tablet Take 5 mg by mouth 2 (two) times daily.     Marland Kitchen atorvastatin (LIPITOR) 20 MG tablet Take 20 mg by mouth daily.     . cholecalciferol (VITAMIN D) 1000 UNITS tablet Take 1,000 Units by mouth daily.    Marland Kitchen diltiazem (CARDIZEM CD) 180 MG 24 hr capsule Take 1 capsule (180 mg total) by mouth daily. 90 capsule 3  . diltiazem (CARDIZEM) 30 MG tablet Take 1 tablet every 4 hours AS NEEDED for afib rapid heart rate over 100 (Patient taking  differently: Take 30-60 mg by mouth daily as needed (for AFIB for heart rate > 100). ) 45 tablet 2  . flecainide (TAMBOCOR) 100 MG tablet TAKE 1 TABLET BY MOUTH TWICE A DAY 180 tablet 0  . fluticasone (FLONASE) 50 MCG/ACT nasal spray Place 1 spray into both nostrils daily.     . Glucosamine-Chondroit-Vit C-Mn (GLUCOSAMINE 1500 COMPLEX) CAPS Take 1 capsule by mouth daily.    Marland Kitchen HYDROcodone-acetaminophen (NORCO/VICODIN) 5-325 MG tablet Take 1-2 tablets by mouth every 6 (six) hours as needed for severe pain. 60 tablet 0  . L-Lysine 500 MG TABS Take 500 mg by mouth daily.     . methocarbamol (ROBAXIN) 500 MG tablet Take 1 tablet (500 mg total) by mouth every 6 (six) hours as needed for muscle spasms. 60 tablet 1  . Mometasone Furoate (ASMANEX HFA) 100 MCG/ACT AERO Inhale 1 puff into the lungs daily.     . montelukast (SINGULAIR) 10 MG tablet Take 10 mg by mouth at bedtime.    . Multiple Vitamin (MULTIVITAMIN) capsule Take 1 capsule by mouth daily.    . Naphazoline-Glycerin (REDNESS RELIEF OP) Place 2 drops into both eyes 4 (four) times daily as needed (for irritation).    Marland Kitchen omeprazole (PRILOSEC) 20 MG capsule Take 20 mg by mouth 2 (two) times daily before a meal.     . predniSONE (DELTASONE) 20 MG tablet Take 10-20 mg by mouth as directed. Uses for asthma flare up  0  . Probiotic Product (ALIGN) 4 MG CAPS Take 4 mg by mouth daily.     . psyllium (METAMUCIL) 58.6 % powder Take 1 packet by mouth 2 (two) times daily.    . vitamin C (ASCORBIC ACID) 500 MG tablet Take 500 mg by mouth daily.    Marland Kitchen zolpidem (AMBIEN) 10 MG tablet Take 10 mg by mouth at bedtime as needed for sleep.     No current facility-administered medications for this visit.     Allergies:   Patient has no known allergies.   Social History:  The patient  reports that he has quit smoking. His smoking use included cigarettes. He has a 10.00 pack-year smoking history. He has quit using smokeless tobacco. He reports current alcohol use of  about 7.0 standard drinks of alcohol per week. He reports that he does not use drugs.   Family History:  The patient's  family history includes Atrial fibrillation in his son; Diabetes in his maternal grandmother; Emphysema in his father; Heart disease in his maternal grandfather and paternal grandfather; Hypertension in his mother; Liver cancer in his paternal grandmother and another family member; Lung cancer in his father and another family member.   ROS:  Please see the history of present illness.   All other systems are personally reviewed and negative.    Exam:    Vital Signs:  BP 137/70   Pulse 61   Well appearing, alert and conversant, regular work of breathing,  good skin color Eyes- anicteric, neuro- grossly intact, skin- no apparent rash or lesions or cyanosis, mouth- oral mucosa is pink   Labs/Other Tests and Data Reviewed:    Recent Labs: 02/11/2018: ALT 26; Magnesium 2.2 03/14/2018: BUN 13; Creatinine, Ser 1.04; Hemoglobin 15.1; Platelets 170; Potassium 3.6; Sodium 141   Wt Readings from Last 3 Encounters:  04/26/18 216 lb (98 kg)  03/29/18 215 lb 12.8 oz (97.9 kg)  02/18/18 214 lb 3.2 oz (97.2 kg)     Other studies personally reviewed: Additional studies/ records that were reviewed today include: my prior note, recent afib clinic note, afib ablation procedure note  Review of the above records today demonstrates: as above Prior radiographs: cardiac CT 03/26/2018 which revealed thoracic aorta 4.3 cm.   ASSESSMENT & PLAN:    1.  Paroxysmal atrial fibrillation Well controlled post ablation We discussed stopping flecainide and diltiazem (which may be contributing to swelling) after covid 19  2. 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 COVID 19 screen The patient denies symptoms of COVID 19 at this time.  The importance of social distancing was discussed today.  Follow-up:  4 months  Current medicines are reviewed at length  with the patient today.   The patient does not have concerns regarding his medicines.  The following changes were made today:  none  Labs/ tests ordered today include:  No orders of the defined types were placed in this encounter.    Patient Risk:  after full review of this patients clinical status, I feel that they are at moderate risk at this time.  Today, I have spent 22 minutes with the patient with telehealth technology discussing afib .    Army Fossa, MD  06/19/2018 9:55 AM     [place]  [address] [phone] (office) [phone] (fax)

## 2018-06-24 ENCOUNTER — Encounter (HOSPITAL_COMMUNITY): Payer: Self-pay

## 2018-06-25 ENCOUNTER — Encounter (INDEPENDENT_AMBULATORY_CARE_PROVIDER_SITE_OTHER): Payer: Medicare Other

## 2018-06-25 DIAGNOSIS — I480 Paroxysmal atrial fibrillation: Secondary | ICD-10-CM | POA: Diagnosis not present

## 2018-06-25 NOTE — Telephone Encounter (Signed)
Jodelle Red tracing from today is reviewed and reveals sinus rhythm with heart rate 54 bpm.  Baseline artifact is noted.  Results sent to patient in [place].

## 2018-07-08 DIAGNOSIS — J301 Allergic rhinitis due to pollen: Secondary | ICD-10-CM | POA: Diagnosis not present

## 2018-07-08 DIAGNOSIS — J3089 Other allergic rhinitis: Secondary | ICD-10-CM | POA: Diagnosis not present

## 2018-07-29 DIAGNOSIS — J3089 Other allergic rhinitis: Secondary | ICD-10-CM | POA: Diagnosis not present

## 2018-07-29 DIAGNOSIS — J301 Allergic rhinitis due to pollen: Secondary | ICD-10-CM | POA: Diagnosis not present

## 2018-08-14 ENCOUNTER — Telehealth: Payer: Self-pay | Admitting: Cardiology

## 2018-08-14 ENCOUNTER — Telehealth (HOSPITAL_COMMUNITY): Payer: Self-pay | Admitting: *Deleted

## 2018-08-14 NOTE — Telephone Encounter (Signed)
Pt would like to start weaning off flecainide and diltiazem as per Dr. Jackalyn Lombard last visit. He will start by decreasing flecainide to 50mg  bid for the next week if no breakthrough afib at that point he will stop. Then he will decrease cardizem to 120mg  a day before completely stopping cardizem. Pt will notify when ready to decrease cardizem to 120mg  a day.

## 2018-08-14 NOTE — Telephone Encounter (Signed)
MyChart msg sent.

## 2018-08-15 DIAGNOSIS — Z20828 Contact with and (suspected) exposure to other viral communicable diseases: Secondary | ICD-10-CM | POA: Diagnosis not present

## 2018-08-20 DIAGNOSIS — J301 Allergic rhinitis due to pollen: Secondary | ICD-10-CM | POA: Diagnosis not present

## 2018-08-20 DIAGNOSIS — J3089 Other allergic rhinitis: Secondary | ICD-10-CM | POA: Diagnosis not present

## 2018-08-21 ENCOUNTER — Telehealth: Payer: Self-pay | Admitting: Cardiology

## 2018-08-21 NOTE — Telephone Encounter (Signed)
called to pre-reg x3, no answer, lvm 08/21/2018 MS

## 2018-08-23 ENCOUNTER — Observation Stay (HOSPITAL_COMMUNITY)
Admission: EM | Admit: 2018-08-23 | Discharge: 2018-08-24 | Disposition: A | Discharge status: Home / Self Care | Payer: Medicare Other | Attending: Cardiology | Admitting: Cardiology

## 2018-08-23 ENCOUNTER — Encounter (HOSPITAL_COMMUNITY): Payer: Self-pay | Admitting: Emergency Medicine

## 2018-08-23 ENCOUNTER — Emergency Department (HOSPITAL_COMMUNITY): Payer: Medicare Other

## 2018-08-23 ENCOUNTER — Other Ambulatory Visit: Payer: Self-pay

## 2018-08-23 ENCOUNTER — Telehealth: Payer: Self-pay | Admitting: Internal Medicine

## 2018-08-23 DIAGNOSIS — Z8546 Personal history of malignant neoplasm of prostate: Secondary | ICD-10-CM | POA: Insufficient documentation

## 2018-08-23 DIAGNOSIS — Z79899 Other long term (current) drug therapy: Secondary | ICD-10-CM | POA: Insufficient documentation

## 2018-08-23 DIAGNOSIS — J45909 Unspecified asthma, uncomplicated: Secondary | ICD-10-CM | POA: Diagnosis not present

## 2018-08-23 DIAGNOSIS — Z20828 Contact with and (suspected) exposure to other viral communicable diseases: Secondary | ICD-10-CM | POA: Insufficient documentation

## 2018-08-23 DIAGNOSIS — Z7901 Long term (current) use of anticoagulants: Secondary | ICD-10-CM | POA: Diagnosis not present

## 2018-08-23 DIAGNOSIS — I77810 Thoracic aortic ectasia: Secondary | ICD-10-CM

## 2018-08-23 DIAGNOSIS — I480 Paroxysmal atrial fibrillation: Secondary | ICD-10-CM | POA: Diagnosis not present

## 2018-08-23 DIAGNOSIS — Z87891 Personal history of nicotine dependence: Secondary | ICD-10-CM | POA: Diagnosis not present

## 2018-08-23 DIAGNOSIS — N289 Disorder of kidney and ureter, unspecified: Secondary | ICD-10-CM | POA: Insufficient documentation

## 2018-08-23 DIAGNOSIS — R079 Chest pain, unspecified: Secondary | ICD-10-CM

## 2018-08-23 DIAGNOSIS — R0789 Other chest pain: Secondary | ICD-10-CM | POA: Diagnosis not present

## 2018-08-23 DIAGNOSIS — I351 Nonrheumatic aortic (valve) insufficiency: Secondary | ICD-10-CM

## 2018-08-23 DIAGNOSIS — I4891 Unspecified atrial fibrillation: Secondary | ICD-10-CM | POA: Diagnosis present

## 2018-08-23 HISTORY — DX: Thoracic aortic ectasia: I77.810

## 2018-08-23 HISTORY — DX: Atherosclerosis of aorta: I70.0

## 2018-08-23 LAB — CBC WITH DIFFERENTIAL/PLATELET
Abs Immature Granulocytes: 0.01 10*3/uL (ref 0.00–0.07)
Basophils Absolute: 0 10*3/uL (ref 0.0–0.1)
Basophils Relative: 0 %
Eosinophils Absolute: 0.1 10*3/uL (ref 0.0–0.5)
Eosinophils Relative: 2 %
HCT: 46.3 % (ref 39.0–52.0)
Hemoglobin: 15.4 g/dL (ref 13.0–17.0)
Immature Granulocytes: 0 %
Lymphocytes Relative: 39 %
Lymphs Abs: 2.7 10*3/uL (ref 0.7–4.0)
MCH: 31.3 pg (ref 26.0–34.0)
MCHC: 33.3 g/dL (ref 30.0–36.0)
MCV: 94.1 fL (ref 80.0–100.0)
Monocytes Absolute: 0.7 10*3/uL (ref 0.1–1.0)
Monocytes Relative: 10 %
Neutro Abs: 3.4 10*3/uL (ref 1.7–7.7)
Neutrophils Relative %: 49 %
Platelets: 159 10*3/uL (ref 150–400)
RBC: 4.92 MIL/uL (ref 4.22–5.81)
RDW: 12.1 % (ref 11.5–15.5)
WBC: 7 10*3/uL (ref 4.0–10.5)
nRBC: 0 % (ref 0.0–0.2)

## 2018-08-23 MED ORDER — NITROGLYCERIN 0.4 MG SL SUBL
0.4000 mg | SUBLINGUAL_TABLET | Freq: Once | SUBLINGUAL | Status: DC
  Filled 2018-08-23: qty 1

## 2018-08-23 MED ORDER — ASPIRIN 81 MG PO CHEW
324.0000 mg | CHEWABLE_TABLET | Freq: Once | ORAL | Status: AC
  Administered 2018-08-24: 324 mg via ORAL
  Filled 2018-08-23: qty 4

## 2018-08-23 MED ORDER — PROPOFOL 10 MG/ML IV BOLUS
50.0000 mg | Freq: Once | INTRAVENOUS | Status: DC
  Filled 2018-08-23: qty 20

## 2018-08-23 NOTE — Telephone Encounter (Signed)
Called by patient stating that he is back in atrial fibrillation with RVR.   Patient recently weaned off flecainide s/p ablation. Had recurrence of tachyarrhythmia this evening, despite maintenance of diltiazem 180mg  XL daily.   Patient monitoring HR with Cardia device, which is reading HR @ 150 bpm. BP 130/80s. Not dizzy or lightheaded. No chest pain or pressure. Does feel very mildly short of breath. Patient previously with good results from PRN 30mg  diltiazem which he has at home. Asking about restart fleclainide as well.   Recommended to patient that he try one time dose of 30mg  diltiazem. Counseled him against starting fleclainide as he has missed ~ 4 doses already. If HR and symptoms improve will make plans to continue diltiazem and remain at home. If still with RVR and/or worsening symptoms patient to come to the ER for evaluation.   Lauren K. Marletta Lor, MD

## 2018-08-23 NOTE — ED Triage Notes (Signed)
Pt c/o heart fluttering and chest pressure that started tonight at 2030. Pt hx afib with recent ablation, states his docs have been trying to adjust his medications.

## 2018-08-23 NOTE — ED Notes (Signed)
EDP at bedside  

## 2018-08-23 NOTE — ED Provider Notes (Signed)
[facility] EMERGENCY DEPARTMENT Provider Note   CSN: [id number] Arrival date & time: 08/23/18  2253    History   Chief Complaint Chief Complaint  Patient presents with  . Atrial Fibrillation    HPI Brandon Simmons. is a 73 y.o. male.     Patient here with chest pressure and palpitations and sensation that he is in atrial fibrillation again.  States everything started about 830 tonight while he was resting.  History of A. fib in the past status post ablation this past January.  His cardiologist has been titrating his medications and he recently stopped flecainide a week ago.  He continues to take Diltiazem 180 mg daily.  He took an extra 30 mg dose tonight without effect.  He measured his heart rate in the 150s at home and this was confirmed by his home monitor.  He complains of pressure in the center of his chest with associated shortness of breath that is constant.  There is no diaphoresis, nausea or vomiting.  There is no cough or fever.  No leg pain or leg swelling.  States he had a brief episode of A. fib in January after his ablation but none since.  No history of CAD and no history of MI.  No stents in the past.  The history is provided by the patient.  Atrial Fibrillation  Pertinent negatives include no chest pain, no abdominal pain and no headaches.    Past Medical History:  Diagnosis Date  . Arthritis   . Asthma   . Diverticulitis   . Diverticulosis   . GERD (gastroesophageal reflux disease)   . Hemorrhoids   . History of echocardiogram    a. Echo 10/16 [facility]):  EF 55%, trace MR, mild TR, mild to mod AI, mild dilated Ao root (41 mm)  . Hyperlipemia   . Organic erectile dysfunction   . PAF (paroxysmal atrial fibrillation) ([place])    a. admx to [facility] in [place], [place] 10/16 with AF with RVR >> converted to NSR with IV Dilt;  b. Flecainide started >> ETT neg for pro-arrhythmia   . Prostate cancer ([place]) 04/25/2006   Gleason 3+4=7  .  Prostatitis   . S/P cardiac cath    a. LHC at [facility] 10/16:  LM ok, mLAD 30%, LCx ok, dRCA 20%  . S/P radiation therapy 01/19/2014 through 03/05/2014                                                      Prostate bed 6600 cGy in 33 sessions                          . Torn rotator cuff    right  40% tear    Patient Active Problem List   Diagnosis Date Noted  . Paroxysmal atrial fibrillation ([place]) 03/28/2018  . Synovial cyst of lumbar facet joint 07/26/2017  . PAF (paroxysmal atrial fibrillation) ([place]) 02/26/2015  . Malignant neoplasm of prostate ([place]) 10/16/2013    Past Surgical History:  Procedure Laterality Date  . ABLATION OF DYSRHYTHMIC FOCUS  03/28/2018  . APPENDECTOMY     1967  . ATRIAL FIBRILLATION ABLATION N/A 03/28/2018   Procedure: ATRIAL FIBRILLATION ABLATION;  Surgeon: [name], MD;  Location:  [place] INVASIVE CV LAB;  Service: Cardiovascular;  Laterality: N/A;  . cataract surgery Left   . COLONOSCOPY    . KNEE ARTHROSCOPY     R  . LARYNX SURGERY     vocal cord lesion- benign  . LUMBAR LAMINECTOMY/DECOMPRESSION MICRODISCECTOMY Right 07/26/2017   Procedure: RIGHT LUMBAR 3- LUMBAR 4 FORAMINOTOMY WITH RESECTION OF SYNOVIAL CYST;  Surgeon: Erline Levine, MD;  Location: [place];  Service: Neurosurgery;  Laterality: Right;  RIGHT LUMBAR 3- LUMBAR 4 FORAMINOTOMY WITH RESECTION OF SYNOVIAL CYST  . POLYPECTOMY    . PROSTATECTOMY  04/25/2006        Home Medications    Prior to Admission medications   Medication Sig Start Date End Date Taking? Authorizing Provider  acetaminophen (TYLENOL) 500 MG tablet Take 500 mg by mouth every 6 (six) hours as needed for mild pain.    [provider]  albuterol (PROVENTIL HFA;VENTOLIN HFA) 108 (90 BASE) MCG/ACT inhaler Inhale 2 puffs into the lungs every 6 (six) hours as needed for wheezing or shortness of breath.     [provider]  Alprostadil (PROSTAGLANDIN E1) POWD 1 mL by Other route as needed (FOR ERECTILE  DYSFUNCTION).     [provider]  apixaban (ELIQUIS) 5 MG TABS tablet Take 5 mg by mouth 2 (two) times daily.     [provider]  atorvastatin (LIPITOR) 20 MG tablet Take 20 mg by mouth daily.     [provider]  cholecalciferol (VITAMIN D) 1000 UNITS tablet Take 1,000 Units by mouth daily.    [provider]  diltiazem (CARDIZEM CD) 180 MG 24 hr capsule Take 1 capsule (180 mg total) by mouth daily. 03/05/18   Allred, Jeneen Rinks, MD  diltiazem (CARDIZEM) 30 MG tablet Take 1 tablet every 4 hours AS NEEDED for afib rapid heart rate over 100 Patient taking differently: Take 30-60 mg by mouth daily as needed (for AFIB for heart rate > 100).  12/04/16   Sherran Needs, NP  flecainide (TAMBOCOR) 100 MG tablet TAKE 1 TABLET BY MOUTH TWICE A DAY 06/03/18   Allred, Jeneen Rinks, MD  fluticasone (FLONASE) 50 MCG/ACT nasal spray Place 1 spray into both nostrils daily.  09/04/13   [provider]  Glucosamine-Chondroit-Vit C-Mn (GLUCOSAMINE 1500 COMPLEX) CAPS Take 1 capsule by mouth daily.    [provider]  HYDROcodone-acetaminophen (NORCO/VICODIN) 5-325 MG tablet Take 1-2 tablets by mouth every 6 (six) hours as needed for severe pain. 07/27/17   Erline Levine, MD  L-Lysine 500 MG TABS Take 500 mg by mouth daily.     [provider]  methocarbamol (ROBAXIN) 500 MG tablet Take 1 tablet (500 mg total) by mouth every 6 (six) hours as needed for muscle spasms. 07/27/17   Erline Levine, MD  Mometasone Furoate [facility] HFA) 100 MCG/ACT AERO Inhale 1 puff into the lungs daily.     [provider]  montelukast (SINGULAIR) 10 MG tablet Take 10 mg by mouth at bedtime.    [provider]  Multiple Vitamin (MULTIVITAMIN) capsule Take 1 capsule by mouth daily.    [provider]  Naphazoline-Glycerin (REDNESS RELIEF OP) Place 2 drops into both eyes 4 (four) times daily as needed (for irritation).    [provider]  omeprazole  (PRILOSEC) 20 MG capsule Take 20 mg by mouth 2 (two) times daily before a meal.     [provider]  predniSONE (DELTASONE) 20 MG tablet Take 10-20 mg by mouth as directed. Uses for  asthma flare up 11/05/17   [provider]  Probiotic Product (ALIGN) 4 MG CAPS Take 4 mg by mouth daily.     [provider]  psyllium (METAMUCIL) 58.6 % powder Take 1 packet by mouth 2 (two) times daily.    [provider]  vitamin C (ASCORBIC ACID) 500 MG tablet Take 500 mg by mouth daily.    [provider]  zolpidem (AMBIEN) 10 MG tablet Take 10 mg by mouth at bedtime as needed for sleep.    [provider]    Family History Family History  Problem Relation Age of Onset  . Emphysema Father   . Lung cancer Father   . Hypertension Mother   . Diabetes Maternal Grandmother   . Heart disease Maternal Grandfather        Died age 59  . Liver cancer Paternal Grandmother   . Heart disease Paternal Grandfather        Died age 61  . Liver cancer Other   . Lung cancer Other   . Atrial fibrillation Son     Social History Social History   Tobacco Use  . Smoking status: Former Smoker    Packs/day: 1.00    Years: 10.00    Pack years: 10.00    Types: Cigarettes  . Smokeless tobacco: Former Systems developer  . Tobacco comment: 1972  Substance Use Topics  . Alcohol use: Yes    Alcohol/week: 7.0 standard drinks    Types: 7 Glasses of wine per week    Comment: 1 glass wine or beer daily  . Drug use: No     Allergies   Patient has no known allergies.   Review of Systems Review of Systems  Constitutional: Negative for activity change, appetite change and fever.  HENT: Negative for congestion and rhinorrhea.   Eyes: Negative for visual disturbance.  Respiratory: Positive for chest tightness.   Cardiovascular: Positive for palpitations. Negative for chest pain and leg swelling.  Gastrointestinal: Negative for abdominal pain, nausea and vomiting.  Genitourinary:  Negative for dysuria and hematuria.  Musculoskeletal: Negative for arthralgias and myalgias.  Neurological: Negative for dizziness, weakness and headaches.    all other systems are negative except as noted in the HPI and PMH.    Physical Exam Updated Vital Signs BP (!) 142/95 (BP Location: Left Arm)   Pulse (!) 111   Temp 98.6 F (37 C) (Oral)   Resp (!) 22   SpO2 99%   Physical Exam Vitals signs and nursing note reviewed.  Constitutional:      General: He is not in acute distress.    Appearance: He is well-developed.  HENT:     Head: Normocephalic and atraumatic.     Mouth/Throat:     Pharynx: No oropharyngeal exudate.  Eyes:     Conjunctiva/sclera: Conjunctivae normal.     Pupils: Pupils are equal, round, and reactive to light.  Neck:     Musculoskeletal: Normal range of motion and neck supple.     Comments: No meningismus. Cardiovascular:     Rate and Rhythm: Normal rate. Rhythm irregular.     Heart sounds: Normal heart sounds. No murmur.     Comments: Irregular rhythm 100-120s Pulmonary:     Effort: Pulmonary effort is normal. No respiratory distress.     Breath sounds: Normal breath sounds.  Abdominal:     Palpations: Abdomen is soft.     Tenderness: There is no abdominal tenderness. There is no guarding or rebound.  Musculoskeletal: Normal range of motion.        General: No tenderness.  Skin:    General: Skin is warm.  Neurological:     Mental Status: He is alert and oriented to person, place, and time.     Cranial Nerves: No cranial nerve deficit.     Motor: No abnormal muscle tone.     Coordination: Coordination normal.     Comments:  5/5 strength throughout. CN 2-12 intact.Equal grip strength.   Psychiatric:        Behavior: Behavior normal.      ED Treatments / Results  Labs (all labs ordered are listed, but only abnormal results are displayed) Labs Reviewed  COMPREHENSIVE METABOLIC PANEL - Abnormal; Notable for the following components:       Result Value   Glucose, Bld 122 (*)    Creatinine, Ser 1.31 (*)    GFR calc non Af Amer 54 (*)    All other components within normal limits  SARS CORONAVIRUS 2 (HOSPITAL ORDER, [place] LAB)  CBC WITH DIFFERENTIAL/PLATELET  TROPONIN I  TROPONIN I    EKG EKG Interpretation  Date/Time:  Friday August 23 2018 22:58:20 EDT Ventricular Rate:  119 PR Interval:    QRS Duration: 88 QT Interval:  286 QTC Calculation: 402 R Axis:   10 Text Interpretation:  Atrial fibrillation with rapid ventricular response Marked ST abnormality, possible inferior subendocardial injury Abnormal ECG inferior and lateral ST depression appears new Confirmed by Ezequiel Essex [phone]) on 08/23/2018 11:08:16 PM   Radiology Dg Chest Portable 1 View  Result Date: 08/24/2018 CLINICAL DATA:  Chest pain EXAM: PORTABLE CHEST 1 VIEW COMPARISON:  02/11/2018. FINDINGS: The heart size and mediastinal contours are within normal limits. Both lungs are clear. The visualized skeletal structures are unremarkable. IMPRESSION: No active disease. Electronically Signed   By: Constance Holster M.D.   On: 08/24/2018 00:09    Procedures Procedures (including critical care time)  Medications Ordered in ED Medications  nitroGLYCERIN (NITROSTAT) SL tablet 0.4 mg (0.4 mg Sublingual Not Given 08/24/18 0104)  atorvastatin (LIPITOR) tablet 20 mg (has no administration in time range)  diltiazem (CARDIZEM CD) 24 hr capsule 180 mg (has no administration in time range)  zolpidem (AMBIEN) tablet 5 mg (has no administration in time range)  pantoprazole (PROTONIX) EC tablet 40 mg (has no administration in time range)  apixaban (ELIQUIS) tablet 5 mg (has no administration in time range)  cholecalciferol (VITAMIN D3) tablet 1,000 Units (has no administration in time range)  multivitamin with minerals tablet 1 tablet (has no administration in time range)  vitamin C (ASCORBIC ACID) tablet 500 mg (has no administration in  time range)  fluticasone (FLONASE) 50 MCG/ACT nasal spray 1 spray (has no administration in time range)  budesonide (PULMICORT) nebulizer solution 0.5 mg (has no administration in time range)  montelukast (SINGULAIR) tablet 10 mg (has no administration in time range)  acetaminophen (TYLENOL) tablet 650 mg (has no administration in time range)  ondansetron (ZOFRAN) injection 4 mg (has no administration in time range)  aspirin chewable tablet 324 mg (324 mg Oral Given 08/24/18 0012)     Initial Impression / Assessment and Plan / ED Course  I have reviewed the triage vital signs and the nursing notes.  Pertinent labs & imaging results that were available during my care of the patient were reviewed by me and considered in my medical decision making (see chart for details).  Atrial fibrillation and chest pressure since about 8:30 PM.  EKG shows new ST depressions inferior laterally.  Patient patient states compliance with eliquis.  D/w Dr. Marletta Lor of cardiology. She agrees EKG changes and description of chest pain is concerning.  She does not recommend electrical or chemical cardioversion at this point and wishes to labs to come back first.  Patient is maintaining mental status and blood pressure and there is no immediate urgency for cardioversion.  Troponin is negative.  Patient's chest pressure has improved.  Discussed again with cardiology Dr. Marletta Lor.  She would like to avoid flecainide given the possibility that patient is EKG changes could be ischemic in nature. Patient is rate controlled and in no distress.  No indication for emergent cardioversion electrically.  Patient denies any missed doses of his Eliquis.  Patient did convert spontaneously to sinus rhythm with improvement of his chest pain.  He was seen by cardiology with plans to admit for observation and perform serial troponins and likely ischemic testing.  Final Clinical Impressions(s) / ED Diagnoses   Final diagnoses:   Paroxysmal atrial fibrillation [facility])  Chest pain, unspecified type    ED Discharge Orders    None       Alisha Burgo, Annie Main, MD 08/24/18 [phone]

## 2018-08-24 ENCOUNTER — Observation Stay (HOSPITAL_BASED_OUTPATIENT_CLINIC_OR_DEPARTMENT_OTHER): Payer: Medicare Other

## 2018-08-24 ENCOUNTER — Encounter (HOSPITAL_COMMUNITY): Payer: Self-pay | Admitting: Physician Assistant

## 2018-08-24 DIAGNOSIS — R9431 Abnormal electrocardiogram [ECG] [EKG]: Secondary | ICD-10-CM | POA: Diagnosis not present

## 2018-08-24 DIAGNOSIS — E785 Hyperlipidemia, unspecified: Secondary | ICD-10-CM | POA: Diagnosis not present

## 2018-08-24 DIAGNOSIS — I480 Paroxysmal atrial fibrillation: Secondary | ICD-10-CM

## 2018-08-24 DIAGNOSIS — N289 Disorder of kidney and ureter, unspecified: Secondary | ICD-10-CM

## 2018-08-24 DIAGNOSIS — I351 Nonrheumatic aortic (valve) insufficiency: Secondary | ICD-10-CM

## 2018-08-24 DIAGNOSIS — I4891 Unspecified atrial fibrillation: Secondary | ICD-10-CM | POA: Diagnosis present

## 2018-08-24 DIAGNOSIS — I77810 Thoracic aortic ectasia: Secondary | ICD-10-CM

## 2018-08-24 LAB — COMPREHENSIVE METABOLIC PANEL
ALT: 24 U/L (ref 0–44)
AST: 32 U/L (ref 15–41)
Albumin: 4.1 g/dL (ref 3.5–5.0)
Alkaline Phosphatase: 57 U/L (ref 38–126)
Anion gap: 11 (ref 5–15)
BUN: 17 mg/dL (ref 8–23)
CO2: 22 mmol/L (ref 22–32)
Calcium: 9.6 mg/dL (ref 8.9–10.3)
Chloride: 110 mmol/L (ref 98–111)
Creatinine, Ser: 1.31 mg/dL — ABNORMAL HIGH (ref 0.61–1.24)
GFR calc Af Amer: >60 mL/min (ref >60)
GFR calc non Af Amer: 54 mL/min — ABNORMAL LOW (ref >60)
Glucose, Bld: 122 mg/dL — ABNORMAL HIGH (ref 70–99)
Potassium: 3.8 mmol/L (ref 3.5–5.1)
Sodium: 143 mmol/L (ref 135–145)
Total Bilirubin: 0.6 mg/dL (ref 0.3–1.2)
Total Protein: 7 g/dL (ref 6.5–8.1)

## 2018-08-24 LAB — TROPONIN I
Troponin I: <0.03 ng/mL (ref <0.03)
Troponin I: <0.03 ng/mL (ref <0.03)

## 2018-08-24 LAB — ECHOCARDIOGRAM COMPLETE

## 2018-08-24 LAB — SARS CORONAVIRUS 2 BY RT PCR (HOSPITAL ORDER, PERFORMED IN ~~LOC~~ HOSPITAL LAB): SARS Coronavirus 2: NEGATIVE

## 2018-08-24 MED ORDER — FLECAINIDE ACETATE 100 MG PO TABS: 50.0000 mg | ORAL_TABLET | Freq: Two times a day (BID) | ORAL | Status: DC

## 2018-08-24 MED ORDER — ACETAMINOPHEN 325 MG PO TABS: 650.0000 mg | ORAL_TABLET | ORAL | Status: DC | PRN

## 2018-08-24 MED ORDER — ATORVASTATIN CALCIUM 10 MG PO TABS
20.0000 mg | ORAL_TABLET | Freq: Every day | ORAL | Status: DC
  Administered 2018-08-24: 20 mg via ORAL
  Filled 2018-08-24: qty 2

## 2018-08-24 MED ORDER — BUDESONIDE 0.5 MG/2ML IN SUSP
0.5000 mg | Freq: Two times a day (BID) | RESPIRATORY_TRACT | Status: DC
  Administered 2018-08-24: 0.5 mg via RESPIRATORY_TRACT
  Filled 2018-08-24: qty 2

## 2018-08-24 MED ORDER — ADULT MULTIVITAMIN W/MINERALS CH
1.0000 | ORAL_TABLET | Freq: Every day | ORAL | Status: DC
  Administered 2018-08-24: 1 via ORAL
  Filled 2018-08-24: qty 1

## 2018-08-24 MED ORDER — FLECAINIDE ACETATE 50 MG PO TABS
50.0000 mg | ORAL_TABLET | Freq: Two times a day (BID) | ORAL | Status: DC
  Administered 2018-08-24: 50 mg via ORAL
  Filled 2018-08-24 (×2): qty 1

## 2018-08-24 MED ORDER — VITAMIN D 25 MCG (1000 UNIT) PO TABS
1000.0000 [IU] | ORAL_TABLET | Freq: Every day | ORAL | Status: DC
  Administered 2018-08-24: 1000 [IU] via ORAL
  Filled 2018-08-24: qty 1

## 2018-08-24 MED ORDER — ONDANSETRON HCL 4 MG/2ML IJ SOLN: 4.0000 mg | Freq: Four times a day (QID) | INTRAMUSCULAR | Status: DC | PRN

## 2018-08-24 MED ORDER — MONTELUKAST SODIUM 10 MG PO TABS: 10.0000 mg | ORAL_TABLET | Freq: Every day | ORAL | Status: DC

## 2018-08-24 MED ORDER — PANTOPRAZOLE SODIUM 40 MG PO TBEC
40.0000 mg | DELAYED_RELEASE_TABLET | Freq: Every day | ORAL | Status: DC
  Administered 2018-08-24: 40 mg via ORAL
  Filled 2018-08-24: qty 1

## 2018-08-24 MED ORDER — APIXABAN 5 MG PO TABS
5.0000 mg | ORAL_TABLET | Freq: Two times a day (BID) | ORAL | Status: DC
  Administered 2018-08-24: 5 mg via ORAL
  Filled 2018-08-24: qty 1

## 2018-08-24 MED ORDER — VITAMIN C 500 MG PO TABS
500.0000 mg | ORAL_TABLET | Freq: Every day | ORAL | Status: DC
  Administered 2018-08-24: 500 mg via ORAL
  Filled 2018-08-24: qty 1

## 2018-08-24 MED ORDER — FLUTICASONE PROPIONATE 50 MCG/ACT NA SUSP
1.0000 | Freq: Every day | NASAL | Status: DC
  Filled 2018-08-24: qty 16

## 2018-08-24 MED ORDER — DILTIAZEM HCL ER COATED BEADS 180 MG PO CP24: 180.0000 mg | ORAL_CAPSULE | Freq: Every day | ORAL | Status: DC

## 2018-08-24 MED ORDER — ZOLPIDEM TARTRATE 5 MG PO TABS: 5.0000 mg | ORAL_TABLET | Freq: Every evening | ORAL | Status: DC | PRN

## 2018-08-24 NOTE — Plan of Care (Signed)
  Problem: Activity: Goal: Ability to return to baseline activity level will improve Outcome: Progressing   Problem: Education: Goal: Individualized Educational Video(s) Outcome: Progressing   Problem: Cardiovascular: Goal: Vascular access site(s) Level 0-1 will be maintained Outcome: Progressing

## 2018-08-24 NOTE — Progress Notes (Signed)
Patient mid to the hospital with atrial fibrillation and rapid rates.  He previously has had an ablation, and has been weaning off of his flecainide.  Flecainide was stopped on Thursday and he went into rapid atrial fibrillation with chest pain on Friday.  He is back in normal rhythm.  We Tylin Force plan to restart his flecainide at 50 mg and his diltiazem at 180 mg.  We Eder Macek have him follow-up with Dr. Rayann Heman in EP clinic.  On exam, regular rhythm, no murmurs, lungs clear.  Jameir Ake Curt Bears, MD 08/24/2018 9:07 AM

## 2018-08-24 NOTE — Discharge Summary (Addendum)
Discharge Summary    Patient ID: Brandon Simmons.,  MRN: [id number], DOB/AGE: 1945-08-18 73 y.o.  Admit date: 08/23/2018 Discharge date: Aug 28, 2018  Primary Care Provider: Leanna Battles Primary Cardiologist: Minus Breeding, MD Primary Electrophysiologist:  [name], MD  Discharge Diagnoses    Principal Problem:   Atrial fibrillation [facility]) Active Problems:   Mild renal insufficiency   Mild aortic regurgitation   Ascending aorta dilatation [facility])   Diagnostic Studies/Procedures    1. 2D echo August 28, 2018 -  IMPRESSIONS  1. The left ventricle has normal systolic function with an ejection fraction of 60-65%. The cavity size was normal. Left ventricular diastolic Doppler parameters are consistent with pseudonormalization.  2. The right ventricle has normal systolic function. The cavity was normal. There is no increase in right ventricular wall thickness.  3. No evidence of mitral valve stenosis.  4. Aortic valve regurgitation is mild by color flow Doppler. No stenosis of the aortic valve.  5. There is mild dilatation of the aortic root and of the ascending aorta.  6. The interatrial septum was not assessed. _____________     History of Present Illness     Brandon Simmons. is a 73 y.o. 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   [name] called on the on call line around 9PM and reported that his HR was elevated to 150s and his Kardia EKG was consistent with atrial fibrillation. He was normotensive at the time but did feel mildly short of breath. It was recommended that he try a one time dose of PRN 30mg  diltiazem, but when this did not slow his HR it was recommended that he come to the ER for evaluation. At the time  of presentation, he reported 5-6/10 chest pressure and was in AF with a rate in the 110s. EKG notable for new STT changes inferolaterally. Initial troponin < 0.03. Of note, this week he had been weaning off of his flecainide and had taken his last dose of 50mg  yesterday AM.  Hospital Course    He was admitted for further evaluation. Troponins remained normal overnight. CBC normal. CR 1.31 (previous values around 1.2), glucose 122.  Overnight he converted to NSR. 2D echo was performed with results above. Dr. Curt Bears recommended he restart his flecainide at 50mg  and diltiazem at 180mg . He recommended f/u with Dr. Rayann Heman in EP clinic. Sent message to schedulers to help arrange. Brandon Simmons keep appointment with Dr. Percival Spanish as previously scheduled. Also advised pt f/u with PCP for mild renal insufficiency. Dr. Curt Bears has seen and examined the patient today and feels he is stable for discharge.  _____________  Discharge Vitals Blood pressure 122/69, pulse (!) 54, temperature 97.6 F (36.4 C), temperature source Oral, resp. rate 12, SpO2 99 %.  General: Well developed, well nourished M in no acute distress Head: Normocephalic, atraumatic, sclera non-icteric, no xanthomas, nares are without discharge. Neck: Negative for carotid bruits. JVP not elevated. Lungs: Clear bilaterally to auscultation without wheezes, rales, or rhonchi. Breathing is unlabored. Heart: RRR S1 S2 without murmurs, rubs, or gallops.  Abdomen: Soft, non-tender, non-distended with normoactive bowel sounds. No rebound/guarding. Extremities: No clubbing or cyanosis. No edema. Distal pedal pulses are 2+ and equal bilaterally. Neuro: Alert and oriented X 3. Moves all extremities  spontaneously. Psych:  Responds to questions appropriately with a normal affect.  Labs & Radiologic Studies    CBC Recent Labs    08/23/18 2310  WBC 7.0  NEUTROABS 3.4  HGB 15.4  HCT 46.3  MCV 94.1  PLT 440   Basic Metabolic Panel Recent Labs     08/23/18 2310  NA 143  K 3.8  CL 110  CO2 22  GLUCOSE 122*  BUN 17  CREATININE 1.31*  CALCIUM 9.6   Liver Function Tests Recent Labs    08/23/18 2310  AST 32  ALT 24  ALKPHOS 57  BILITOT 0.6  PROT 7.0  ALBUMIN 4.1   No results for input(s): LIPASE, AMYLASE in the last 72 hours. Cardiac Enzymes Recent Labs    08/23/18 2310 08/24/18 0353  TROPONINI <0.03 <0.03   ____________  Dg Chest Portable 1 View  Result Date: 08/24/2018 CLINICAL DATA:  Chest pain EXAM: PORTABLE CHEST 1 VIEW COMPARISON:  02/11/2018. FINDINGS: The heart size and mediastinal contours are within normal limits. Both lungs are clear. The visualized skeletal structures are unremarkable. IMPRESSION: No active disease. Electronically Signed   By: Constance Holster M.D.   On: 08/24/2018 00:09   Disposition   Pt is being discharged home today in good condition.  Follow-up Plans & Appointments    Follow-up Information    [name], MD Follow up.   Specialty:  Cardiology Why:  Our office Brandon Simmons call you for a follow-up appointment with the EP/afib team. Please call the office if you have not heard from [place] within 3 days. Contact information: [address] [place] [address] [phone]        Minus Breeding, MD Follow up.   Specialty:  Cardiology Why:  Keep virtual visit as scheduled 08/29/18 Contact information: [address] [place] [address] [phone]        Leanna Battles, MD Follow up.   Specialty:  Internal Medicine Why:  Your labwork showed a mild increase in your kidney function to 1.33 although not dramatically different in your prior levels from 1.1-1.2. Please follow up with primary care to have this monitored. Contact information: [address] [place] [address] [phone]          Discharge Instructions    Diet - low sodium heart healthy   Complete by:  As directed    Discharge instructions   Complete by:  As  directed    You can restart your flecainide at 50mg  twice a day. You may use up the 100mg  tablets you have at home by taking 1/2 tablet twice a day. If you find this regimen works well for you, let us know and our office can refill at the lower dose tablet size to make things easier.  Patients with evidence of any abnormality in kidney function should generally stay away from medicines like ibuprofen, Advil, Motrin, naproxen, and Aleve due to risk of worsening kidney function so do not take any for now. You may take Tylenol as directed or talk to primary doctor about alternatives for pain issues if they arise.  Your home medicine list included two medicines that both contain Tylenol - acetaminophen and hydrocodone/acetaminophen (Norco/Vicodin). Just be aware of the content of acetaminophen when taking these medicines as you should not take more than 1000mg  of acetaminophen every 6 hours, and no more than 4000mg  total in a day.  Your home medicine list includes a sleep aid called zolpidem or Ambien. The  FDA recommends that the maximum dose for adults over 65 is 5mg  but you are on 10mg . Please discuss with the provider that prescribes this medicine for you.   Increase activity slowly   Complete by:  As directed       Discharge Medications   Allergies as of 08/24/2018   No Known Allergies     Medication List    TAKE these medications   Align 4 MG Caps Take 4 mg by mouth daily.   Asmanex HFA 100 MCG/ACT Aero Generic drug:  Mometasone Furoate Inhale 1 puff into the lungs daily.   atorvastatin 20 MG tablet Commonly known as:  LIPITOR Take 20 mg by mouth daily.   cholecalciferol 1000 units tablet Commonly known as:  VITAMIN D Take 1,000 Units by mouth daily.   diltiazem 180 MG 24 hr capsule Commonly known as:  Cardizem CD Take 1 capsule (180 mg total) by mouth daily.   diltiazem 30 MG tablet Commonly known as:  Cardizem Take 1 tablet every 4 hours AS NEEDED for afib rapid heart  rate over 100 What changed:    how much to take  how to take this  when to take this  reasons to take this  additional instructions   Eliquis 5 MG Tabs tablet Generic drug:  apixaban Take 5 mg by mouth 2 (two) times daily.   flecainide 100 MG tablet Commonly known as:  TAMBOCOR Take 0.5 tablets (50 mg total) by mouth 2 (two) times daily. What changed:  how much to take   fluticasone 50 MCG/ACT nasal spray Commonly known as:  FLONASE Place 1 spray into both nostrils daily.   HYDROcodone-acetaminophen 5-325 MG tablet Commonly known as:  NORCO/VICODIN Take 1-2 tablets by mouth every 6 (six) hours as needed for severe pain.   methocarbamol 500 MG tablet Commonly known as:  ROBAXIN Take 1 tablet (500 mg total) by mouth every 6 (six) hours as needed for muscle spasms.   montelukast 10 MG tablet Commonly known as:  SINGULAIR Take 10 mg by mouth at bedtime.   multivitamin capsule Take 1 capsule by mouth daily.   omeprazole 20 MG capsule Commonly known as:  PRILOSEC Take 20 mg by mouth 2 (two) times daily before a meal.   psyllium 58.6 % powder Commonly known as:  METAMUCIL Take 1 packet by mouth 2 (two) times daily.   vitamin C 500 MG tablet Commonly known as:  ASCORBIC ACID Take 500 mg by mouth daily.        Allergies:  No Known Allergies   Outstanding Labs/Studies   Recommend OP f/u for Creatinine  Duration of Discharge Encounter   Greater than 30 minutes including physician time.  Signed, Charlie Pitter PA-C 08/24/2018, 12:53 PM   I have seen and examined this patient with Melina Copa.  Agree with above, note added to reflect my findings.  On exam, RRR, no murmurs, lungs clear. Patient admitted with AF and chest pain. Is back in sinus rhythm. Offie Waide restart low dose flecainide and have him follow up with Dr. Rayann Heman in clinic.    Rayvin Abid M. Bladen Umar MD 08/24/2018 5:01 PM

## 2018-08-24 NOTE — H&P (Signed)
Cardiology Admission History and Physical:   Patient ID: Brandon Simmons. MRN: [id number]; DOB: May 23, 1945   Admission date: 08/23/2018  Primary Care Provider: Leanna Battles, MD Primary Cardiologist: No primary care provider on file.  Primary Electrophysiologist:  [name], MD   Chief Complaint:  "im back in afib"  Patient Profile:   Brandon Simmons. is a 73 y.o. male with pAF s/p ablation in 03/2018 previously on flecainide and diltiazem, as well as asthma and HLD who presents with elevated pulse and concern for recurrent AF.   History of Present Illness:   Brandon Simmons called on the on call line around 9PM and reported that his HR was elevated to 150s and his Kardia EKG was consistent with atrial fibrillation. He was normotensive at the time but did feel mildly short of breath. It was recommended that he try a one time dose of PRN 30mg  diltiazem, but when this did not slow his HR it was recommended that he come to the ER for evaluation.   At the time of presentation, he reported 5-6/10 chest pressure and was in AF with a rate in the 110s. EKG notable for new ST depressions inferolaterally. Initial troponin < 0.03. At the time of my evaluation, his chest pressure had improved but was not entirely resolved and his HR was in the 70s.   Of note, this week he had been weaning off of his flecainide and had taken his last dose of 50mg  yesterday AM.   On review of systems he denies fevers, chills, or cough. He states he feels "wiped" which is consistent with his prior episodes of afib. He has some ankle swelling which he says has been chronic. Denies orthopnea or PND.    Past Medical History:  Diagnosis Date   Arthritis    Asthma    Diverticulitis    Diverticulosis    GERD (gastroesophageal reflux disease)    Hemorrhoids    History of echocardiogram    a. Echo 10/16 ([place]):  EF 55%, trace MR, mild TR, mild to mod AI, mild dilated Ao root (41 mm)    Hyperlipemia    Organic erectile dysfunction    PAF (paroxysmal atrial fibrillation) ([place])    a. admx to [facility] in [place], [place] 10/16 with AF with RVR >> converted to NSR with IV Dilt;  b. Flecainide started >> ETT neg for pro-arrhythmia    Prostate cancer ([place]) 04/25/2006   Gleason 3+4=7   Prostatitis    S/P cardiac cath    a. LHC at [facility] 10/16:  LM ok, mLAD 30%, LCx ok, dRCA 20%   S/P radiation therapy 01/19/2014 through 03/05/2014                                                      Prostate bed 6600 cGy in 33 sessions                           Torn rotator cuff    right  40% tear    Past Surgical History:  Procedure Laterality Date   ABLATION OF DYSRHYTHMIC FOCUS  03/28/2018   APPENDECTOMY     1967   ATRIAL FIBRILLATION ABLATION N/A 03/28/2018   Procedure: ATRIAL FIBRILLATION ABLATION;  Surgeon: Rayann Heman,  Jeneen Rinks, MD;  Location: [facility];  Service: Cardiovascular;  Laterality: N/A;   cataract surgery Left    COLONOSCOPY     KNEE ARTHROSCOPY     R   LARYNX SURGERY     vocal cord lesion- benign   LUMBAR LAMINECTOMY/DECOMPRESSION MICRODISCECTOMY Right 07/26/2017   Procedure: RIGHT LUMBAR 3- LUMBAR 4 FORAMINOTOMY WITH RESECTION OF SYNOVIAL CYST;  Surgeon: Erline Levine, MD;  Location: [facility];  Service: Neurosurgery;  Laterality: Right;  RIGHT LUMBAR 3- LUMBAR 4 FORAMINOTOMY WITH RESECTION OF SYNOVIAL CYST   POLYPECTOMY     PROSTATECTOMY  04/25/2006     Medications Prior to Admission: Prior to Admission medications   Medication Sig Start Date End Date Taking? Authorizing Provider  acetaminophen (TYLENOL) 500 MG tablet Take 500 mg by mouth every 6 (six) hours as needed for mild pain.    [provider]  albuterol (PROVENTIL HFA;VENTOLIN HFA) 108 (90 BASE) MCG/ACT inhaler Inhale 2 puffs into the lungs every 6 (six) hours as needed for wheezing or shortness of breath.     [provider]  Alprostadil (PROSTAGLANDIN E1) POWD 1 mL  by Other route as needed (FOR ERECTILE DYSFUNCTION).     [provider]  apixaban (ELIQUIS) 5 MG TABS tablet Take 5 mg by mouth 2 (two) times daily.     [provider]  atorvastatin (LIPITOR) 20 MG tablet Take 20 mg by mouth daily.     [provider]  cholecalciferol (VITAMIN D) 1000 UNITS tablet Take 1,000 Units by mouth daily.    [provider]  diltiazem (CARDIZEM CD) 180 MG 24 hr capsule Take 1 capsule (180 mg total) by mouth daily. 03/05/18   Allred, Jeneen Rinks, MD  diltiazem (CARDIZEM) 30 MG tablet Take 1 tablet every 4 hours AS NEEDED for afib rapid heart rate over 100 Patient taking differently: Take 30-60 mg by mouth daily as needed (for AFIB for heart rate > 100).  12/04/16   Sherran Needs, NP  flecainide (TAMBOCOR) 100 MG tablet TAKE 1 TABLET BY MOUTH TWICE A DAY 06/03/18   Allred, Jeneen Rinks, MD  fluticasone (FLONASE) 50 MCG/ACT nasal spray Place 1 spray into both nostrils daily.  09/04/13   [provider]  Glucosamine-Chondroit-Vit C-Mn (GLUCOSAMINE 1500 COMPLEX) CAPS Take 1 capsule by mouth daily.    [provider]  HYDROcodone-acetaminophen (NORCO/VICODIN) 5-325 MG tablet Take 1-2 tablets by mouth every 6 (six) hours as needed for severe pain. 07/27/17   Erline Levine, MD  L-Lysine 500 MG TABS Take 500 mg by mouth daily.     [provider]  methocarbamol (ROBAXIN) 500 MG tablet Take 1 tablet (500 mg total) by mouth every 6 (six) hours as needed for muscle spasms. 07/27/17   Erline Levine, MD  Mometasone Furoate [facility] HFA) 100 MCG/ACT AERO Inhale 1 puff into the lungs daily.     [provider]  montelukast (SINGULAIR) 10 MG tablet Take 10 mg by mouth at bedtime.    [provider]  Multiple Vitamin (MULTIVITAMIN) capsule Take 1 capsule by mouth daily.    [provider]  Naphazoline-Glycerin (REDNESS RELIEF OP) Place 2 drops into both eyes 4 (four) times daily as needed (for irritation).     [provider]  omeprazole (PRILOSEC) 20 MG capsule Take 20 mg by mouth 2 (two) times daily before a meal.     [provider]  predniSONE (DELTASONE) 20 MG tablet Take 10-20 mg by mouth as directed. Uses for  asthma flare up 11/05/17   [provider]  Probiotic Product (ALIGN) 4 MG CAPS Take 4 mg by mouth daily.     [provider]  psyllium (METAMUCIL) 58.6 % powder Take 1 packet by mouth 2 (two) times daily.    [provider]  vitamin C (ASCORBIC ACID) 500 MG tablet Take 500 mg by mouth daily.    [provider]  zolpidem (AMBIEN) 10 MG tablet Take 10 mg by mouth at bedtime as needed for sleep.    [provider]     Allergies:   No Known Allergies  Social History:   Social History   Socioeconomic History   Marital status: Married    Spouse name: Not on file   Number of children: 2   Years of education: Not on file   Highest education level: Not on file  Occupational History   Occupation: Immunologist [employer]   Occupation: Government social research officer strain: Not on file   Food insecurity:    Worry: Not on file    Inability: Not on file   Transportation needs:    Medical: Not on file    Non-medical: Not on file  Tobacco Use   Smoking status: Former Smoker    Packs/day: 1.00    Years: 10.00    Pack years: 10.00    Types: Cigarettes   Smokeless tobacco: Former Systems developer   Tobacco comment: 1972  Substance and Sexual Activity   Alcohol use: Yes    Alcohol/week: 7.0 standard drinks    Types: 7 Glasses of wine per week    Comment: 1 glass wine or beer daily   Drug use: No   Sexual activity: Not on file  Lifestyle   Physical activity:    Days per week: Not on file    Minutes per session: Not on file   Stress: Not on file  Relationships   Social connections:    Talks on phone: Not on file    Gets together: Not on file    Attends religious service: Not on file    Active member of  club or organization: Not on file    Attends meetings of clubs or organizations: Not on file    Relationship status: Not on file   Intimate partner violence:    Fear of current or ex partner: Not on file    Emotionally abused: Not on file    Physically abused: Not on file    Forced sexual activity: Not on file  Other Topics Concern   Not on file  Social History Narrative   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  Consultant for schools.   Travels over seas     Family History:   The patient's family history includes Atrial fibrillation in his son; Diabetes in his maternal grandmother; Emphysema in his father; Heart disease in his maternal grandfather and paternal grandfather; Hypertension in his mother; Liver cancer in his paternal grandmother and another family member; Lung cancer in his father and another family member.    Review of Systems: [y] = yes, [ ]  = no     General: Weight gain [ ] ; Weight loss [ ] ; Anorexia [ ] ; Fatigue Blue.Reese ]; Fever [ ] ; Chills [ ] ; Weakness [ ]    Cardiac: Chest pain/pressure [ y]; Resting SOB Blue.Reese ]; Exertional SOB [ ] ; Orthopnea [ ] ; Pedal Edema [ ] ; Palpitations [ ] ; Syncope [ ] ; Presyncope [ ] ; Paroxysmal nocturnal dyspnea[ ]   Pulmonary: Cough [ ] ; Wheezing[ ] ; Hemoptysis[ ] ; Sputum [ ] ; Snoring [ ]    GI: Vomiting[ ] ; Dysphagia[ ] ; Melena[ ] ; Hematochezia [ ] ; Heartburn[ ] ; Abdominal pain [ ] ; Constipation [ ] ; Diarrhea [ ] ; BRBPR [ ]    GU: Hematuria[ ] ; Dysuria [ ] ; Nocturia[ ]    Vascular: Pain in legs with walking [ ] ; Pain in feet with lying flat [ ] ; Non-healing sores [ ] ; Stroke [ ] ; TIA [ ] ; Slurred speech [ ] ;   Neuro: Headaches[ ] ; Vertigo[ ] ; Seizures[ ] ; Paresthesias[ ] ;Blurred vision [ ] ; Diplopia [ ] ; Vision changes [ ]    Ortho/Skin: Arthritis [ ] ; Joint pain [ ] ; Muscle pain [ ] ; Joint swelling [ ] ; Back Pain [ ] ; Rash [ ]    Psych: Depression[ ] ; Anxiety[ ]    Heme: Bleeding problems [ ] ; Clotting disorders [ ] ; Anemia [ ]    Endocrine: Diabetes [ ] ;  Thyroid dysfunction[ ]   Physical Exam/Data:   Vitals:   08/24/18 0030 08/24/18 0045 08/24/18 0100 08/24/18 0115  BP: 138/82 138/85 135/86 121/75  Pulse: 73 81 67 61  Resp: 19 16 20 12   Temp:      TempSrc:      SpO2: 96% 97% 97% 99%   No intake or output data in the 24 hours ending 08/24/18 0119 There were no vitals filed for this visit. There is no height or weight on file to calculate BMI.  General:  Well nourished, well developed, in no acute distress HEENT: normal Lymph: no adenopathy Neck: no JVD Endocrine:  No thryomegaly Vascular: No carotid bruits; FA pulses 2+ bilaterally without bruits  Cardiac:  Irregularly, irregular. No mrg.  Lungs:  clear to auscultation bilaterally, no wheezing, rhonchi or rales  Abd: soft, nontender, no hepatomegaly  Ext: 2+ pitting edema at the ankles.  Musculoskeletal:  No deformities, BUE and BLE strength normal and equal Skin: warm and dry  Neuro:  CNs 2-12 intact, no focal abnormalities noted Psych:  Normal affect   EKG:  The ECG that was done demonstrates atrial fibrillation with ST depressions (new) inferolaterally. These ST changes have improved with decreasing HR.   Relevant CV Studies: TTE 08/2017: Study Conclusions  - Left ventricle: The cavity size was normal. Systolic function was   at the lower limits of normal. The estimated ejection fraction   was in the range of 50% to 55%. Wall motion was normal; there   were no regional wall motion abnormalities. Left ventricular   diastolic function parameters were normal. - Aortic valve: There was mild regurgitation. - Ascending aorta: The ascending aorta was mildly dilated. - Mitral valve: Systolic bowing without prolapse.  LHC 12/2014: LHC at [facility] 10/16:  LM ok, mLAD 30%, LCx ok, dRCA 20%  Laboratory Data:  Chemistry Recent Labs  Lab 08/23/18 2310  NA 143  K 3.8  CL 110  CO2 22  GLUCOSE 122*  BUN 17  CREATININE 1.31*  CALCIUM 9.6  GFRNONAA 54*  GFRAA >60   ANIONGAP 11    Recent Labs  Lab 08/23/18 2310  PROT 7.0  ALBUMIN 4.1  AST 32  Simmons 24  ALKPHOS 57  BILITOT 0.6   Hematology Recent Labs  Lab 08/23/18 2310  WBC 7.0  RBC 4.92  HGB 15.4  HCT 46.3  MCV 94.1  MCH 31.3  MCHC 33.3  RDW 12.1  PLT 159   Cardiac Enzymes Recent Labs  Lab 08/23/18 2310  TROPONINI <0.03   No results for input(s): TROPIPOC  in the last 168 hours.  BNPNo results for input(s): BNP, PROBNP in the last 168 hours.  DDimer No results for input(s): DDIMER in the last 168 hours.  Radiology/Studies:  Dg Chest Portable 1 View  Result Date: 08/24/2018 CLINICAL DATA:  Chest pain EXAM: PORTABLE CHEST 1 VIEW COMPARISON:  02/11/2018. FINDINGS: The heart size and mediastinal contours are within normal limits. Both lungs are clear. The visualized skeletal structures are unremarkable. IMPRESSION: No active disease. Electronically Signed   By: Constance Holster M.D.   On: 08/24/2018 00:09    Assessment and Plan:   Brandon Simmons is a 73 year old gentleman with known pAF s/p ablation who presents with recurrent AF with RVR in the setting of recent discontinuation of his flecainide. While in AF this evening, he experienced chest pressure with associated EKG changes that are concerning for rate-related demand. This is supported by the fact that his chest pressure and EKG changes have improved at lower HR. He is clearly symptomatic while in atrial fibrillation, but if he does have underlying myocardial ischemia restarting flecainide may be contraindicated. Thus, given CP and EKG changes, decision made to place in observation and r/o ACS / myocardial injury prior to making a decision about rhythm control strategies (flecainide v. DCCV). He has not missed a dose of his Eliquis and thus either would be an option without TEE. Given that he is well rate controlled, did not feel that urgent DCCV was warranted.   #ST depressions, EKG changes -- Will cycle troponins q 6 to r/o  myocardial ischemia -- Repeat TTE to evaluate function -- SLN PRN for recurrent chest pain -- If troponins negative; would consider non-invasive ischemic evaluation   #pAF with RVR -- Continue home diltiazem; now well rate controlled.  -- HOLD flecainide until ACS ruled out.  -- NPO after midnight in case of DCCV tomorrow (will not need TEE).  -- Continue home apixiban 5 BID  #HLD -- Continue home atorvastatin 20mg    Severity of Illness: The appropriate patient status for this patient is OBSERVATION. Observation status is judged to be reasonable and necessary in order to provide the required intensity of service to ensure the patient's safety. The patient's presenting symptoms, physical exam findings, and initial radiographic and laboratory data in the context of their medical condition is felt to place them at decreased risk for further clinical deterioration. Furthermore, it is anticipated that the patient will be medically stable for discharge from the hospital within 2 midnights of admission. The following factors support the patient status of observation.   " The patient's presenting symptoms include atrial fibrillation. " The physical exam findings include edema " The initial radiographic and laboratory data are concerning for EKG changes.    For questions or updates, please contact [place] Please consult [email or web address] for contact info under    Signed, Milus Banister, MD  08/24/2018 1:19 AM

## 2018-08-24 NOTE — Discharge Instructions (Signed)
Atrial Fibrillation ° °Atrial fibrillation is a type of heartbeat that is irregular or fast (rapid). If you have this condition, your heart beats without any order. This makes it hard for your heart to pump blood in a normal way. Having this condition gives you more risk for stroke, heart failure, and other heart problems. °Atrial fibrillation may start all of a sudden and then stop on its own, or it may become a long-lasting problem. °What are the causes? °This condition may be caused by heart conditions, such as: °· High blood pressure. °· Heart failure. °· Heart valve disease. °· Heart surgery. °Other causes include: °· Pneumonia. °· Obstructive sleep apnea. °· Lung cancer. °· Thyroid disease. °· Drinking too much alcohol. °Sometimes the cause is not known. °What increases the risk? °You are more likely to develop this condition if: °· You smoke. °· You are older. °· You have diabetes. °· You are overweight. °· You have a family history of this condition. °· You exercise often and hard. °What are the signs or symptoms? °Common symptoms of this condition include: °· A feeling like your heart is beating very fast. °· Chest pain. °· Feeling short of breath. °· Feeling light-headed or weak. °· Getting tired easily. °Follow these instructions at home: °Medicines °· Take over-the-counter and prescription medicines only as told by your doctor. °· If your doctor gives you a blood-thinning medicine, take it exactly as told. Taking too much of it can cause bleeding. Taking too little of it does not protect you against clots. Clots can cause a stroke. °Lifestyle ° °  ° °· Do not use any tobacco products. These include cigarettes, chewing tobacco, and e-cigarettes. If you need help quitting, ask your doctor. °· Do not drink alcohol. °· Do not drink beverages that have caffeine. These include coffee, soda, and tea. °· Follow diet instructions as told by your doctor. °· Exercise regularly as told by your doctor. °General  instructions °· If you have a condition that causes breathing to stop for a short period of time (apnea), treat it as told by your doctor. °· Keep a healthy weight. Do not use diet pills unless your doctor says they are safe for you. Diet pills may make heart problems worse. °· Keep all follow-up visits as told by your doctor. This is important. °Contact a doctor if: °· You notice a change in the speed, rhythm, or strength of your heartbeat. °· You are taking a blood-thinning medicine and you see more bruising. °· You get tired more easily when you move or exercise. °· You have a sudden change in weight. °Get help right away if: ° °· You have pain in your chest or your belly (abdomen). °· You have trouble breathing. °· You have blood in your vomit, poop, or pee (urine). °· You have any signs of a stroke. "BE FAST" is an easy way to remember the main warning signs: °? B - Balance. Signs are dizziness, sudden trouble walking, or loss of balance. °? E - Eyes. Signs are trouble seeing or a change in how you see. °? F - Face. Signs are sudden weakness or loss of feeling in the face, or the face or eyelid drooping on one side. °? A - Arms. Signs are weakness or loss of feeling in an arm. This happens suddenly and usually on one side of the body. °? S - Speech. Signs are sudden trouble speaking, slurred speech, or trouble understanding what people say. °? T - Time.   Time to call emergency services. Write down what time symptoms started.  You have other signs of a stroke, such as: ? A sudden, very bad headache with no known cause. ? Feeling sick to your stomach (nausea). ? Throwing up (vomiting). ? Jerky movements you cannot control (seizure). These symptoms may be an emergency. Do not wait to see if the symptoms will go away. Get medical help right away. Call your local emergency services (911 in the [place] Do not drive yourself to the hospital. Summary  Atrial fibrillation is a type of heartbeat that is irregular  or fast (rapid).  You are at higher risk of this condition if you smoke, are older, have diabetes, or are overweight.  Follow your doctor's instructions about medicines, diet, exercise, and follow-up visits.  Get help right away if you think that you have signs of a stroke. This information is not intended to replace advice given to you by your health care provider. Make sure you discuss any questions you have with your health care provider. Document Released: 12/14/2007 Document Revised: 04/27/2017 Document Reviewed: 04/27/2017 Elsevier Interactive Patient Education  2019 [place] Heart-healthy meal planning includes:  Eating less unhealthy fats.  Eating more healthy fats.  Making other changes in your diet. Talk with your doctor or a diet specialist (dietitian) to create an eating plan that is right for you. What is my plan? Your doctor may recommend an eating plan that includes:  Total fat: ______% or less of total calories a day.  Saturated fat: ______% or less of total calories a day.  Cholesterol: less than _________mg a day. What are tips for following this plan? Cooking Avoid frying your food. Try to bake, boil, grill, or broil it instead. You can also reduce fat by:  Removing the skin from poultry.  Removing all visible fats from meats.  Steaming vegetables in water or broth. Meal planning   At meals, divide your plate into four equal parts: ? Fill one-half of your plate with vegetables and green salads. ? Fill one-fourth of your plate with whole grains. ? Fill one-fourth of your plate with lean protein foods.  Eat 4-5 servings of vegetables per day. A serving of vegetables is: ? 1 cup of raw or cooked vegetables. ? 2 cups of raw leafy greens.  Eat 4-5 servings of fruit per day. A serving of fruit is: ? 1 medium whole fruit. ?  cup of dried fruit. ?  cup of fresh, frozen, or canned fruit. ?  cup of 100% fruit  juice.  Eat more foods that have soluble fiber. These are apples, broccoli, carrots, beans, peas, and barley. Try to get 20-30 g of fiber per day.  Eat 4-5 servings of nuts, legumes, and seeds per week: ? 1 serving of dried beans or legumes equals  cup after being cooked. ? 1 serving of nuts is  cup. ? 1 serving of seeds equals 1 tablespoon. General information  Eat more home-cooked food. Eat less restaurant, buffet, and fast food.  Limit or avoid alcohol.  Limit foods that are high in starch and sugar.  Avoid fried foods.  Lose weight if you are overweight.  Keep track of how much salt (sodium) you eat. This is important if you have high blood pressure. Ask your doctor to tell you more about this.  Try to add vegetarian meals each week. Fats  Choose healthy fats. These include olive oil and canola oil, flaxseeds, walnuts, almonds, and  seeds.  Eat more omega-3 fats. These include salmon, mackerel, sardines, tuna, flaxseed oil, and ground flaxseeds. Try to eat fish at least 2 times each week.  Check food labels. Avoid foods with trans fats or high amounts of saturated fat.  Limit saturated fats. ? These are often found in animal products, such as meats, butter, and cream. ? These are also found in plant foods, such as palm oil, palm kernel oil, and coconut oil.  Avoid foods with partially hydrogenated oils in them. These have trans fats. Examples are stick margarine, some tub margarines, cookies, crackers, and other baked goods. What foods can I eat? Fruits All fresh, canned (in natural juice), or frozen fruits. Vegetables Fresh or frozen vegetables (raw, steamed, roasted, or grilled). Green salads. Grains Most grains. Choose whole wheat and whole grains most of the time. Rice and pasta, including brown rice and pastas made with whole wheat. Meats and other proteins Lean, well-trimmed beef, veal, pork, and lamb. Chicken and [place] without skin. All fish and shellfish.  Wild duck, rabbit, pheasant, and venison. Egg whites or low-cholesterol egg substitutes. Dried beans, peas, lentils, and tofu. Seeds and most nuts. Dairy Low-fat or nonfat cheeses, including ricotta and mozzarella. Skim or 1% milk that is liquid, powdered, or evaporated. Buttermilk that is made with low-fat milk. Nonfat or low-fat yogurt. Fats and oils Non-hydrogenated (trans-free) margarines. Vegetable oils, including soybean, sesame, sunflower, olive, peanut, safflower, corn, canola, and cottonseed. Salad dressings or mayonnaise made with a vegetable oil. Beverages Mineral water. Coffee and tea. Diet carbonated beverages. Sweets and desserts Sherbet, gelatin, and fruit ice. Small amounts of dark chocolate. Limit all sweets and desserts. Seasonings and condiments All seasonings and condiments. The items listed above may not be a complete list of foods and drinks you can eat. Contact a dietitian for more options. What foods should I avoid? Fruits Canned fruit in heavy syrup. Fruit in cream or butter sauce. Fried fruit. Limit coconut. Vegetables Vegetables cooked in cheese, cream, or butter sauce. Fried vegetables. Grains Breads that are made with saturated or trans fats, oils, or whole milk. Croissants. Sweet rolls. Donuts. High-fat crackers, such as cheese crackers. Meats and other proteins Fatty meats, such as hot dogs, ribs, sausage, bacon, rib-eye roast or steak. High-fat deli meats, such as salami and bologna. Caviar. Domestic duck and goose. Organ meats, such as liver. Dairy Cream, sour cream, cream cheese, and creamed cottage cheese. Whole-milk cheeses. Whole or 2% milk that is liquid, evaporated, or condensed. Whole buttermilk. Cream sauce or high-fat cheese sauce. Yogurt that is made from whole milk. Fats and oils Meat fat, or shortening. Cocoa butter, hydrogenated oils, palm oil, coconut oil, palm kernel oil. Solid fats and shortenings, including bacon fat, salt pork, lard, and  butter. Nondairy cream substitutes. Salad dressings with cheese or sour cream. Beverages Regular sodas and juice drinks with added sugar. Sweets and desserts Frosting. Pudding. Cookies. Cakes. Pies. Milk chocolate or white chocolate. Buttered syrups. Full-fat ice cream or ice cream drinks. The items listed above may not be a complete list of foods and drinks to avoid. Contact a dietitian for more information. Summary  Heart-healthy meal planning includes eating less unhealthy fats, eating more healthy fats, and making other changes in your diet.  Eat a balanced diet. This includes fruits and vegetables, low-fat or nonfat dairy, lean protein, nuts and legumes, whole grains, and heart-healthy oils and fats. This information is not intended to replace advice given to you by your health care provider. Make  sure you discuss any questions you have with your health care provider. Document Released: 09/05/2011 Document Revised: 04/13/2017 Document Reviewed: 04/13/2017 Elsevier Interactive Patient Education  2019 [employer].

## 2018-08-26 ENCOUNTER — Telehealth: Payer: Self-pay | Admitting: Cardiology

## 2018-08-26 NOTE — Telephone Encounter (Signed)
LVM for patient to call back regarding change in appointment to 09-30-18 at 10:20 a.m.

## 2018-08-27 ENCOUNTER — Telehealth: Payer: Self-pay | Admitting: Cardiology

## 2018-08-27 NOTE — Telephone Encounter (Signed)
Patient was hospitalized over the weekend, and part of his d/c instructions were to follow up with Dr. Percival Spanish during his appointment that was previously scheduled for Thursday. He was concerned when his appointment got changed to July 13, and wanted to speak with Dr. Percival Spanish about his recent hospitalization.

## 2018-08-27 NOTE — Telephone Encounter (Signed)
Follow up     Pt is calling and says the Afib clinic scheduled him a virtual visit for tomorrow, so he doesn't feel he needs to change his July appt to a sooner appt with Dr Percival Spanish

## 2018-08-28 ENCOUNTER — Other Ambulatory Visit (HOSPITAL_COMMUNITY): Payer: Self-pay | Admitting: *Deleted

## 2018-08-28 ENCOUNTER — Ambulatory Visit (HOSPITAL_COMMUNITY)
Admission: RE | Admit: 2018-08-28 | Discharge: 2018-08-28 | Disposition: A | Discharge status: Home / Self Care | Payer: Medicare Other | Source: Ambulatory Visit | Attending: Physician Assistant | Admitting: Physician Assistant

## 2018-08-28 ENCOUNTER — Other Ambulatory Visit: Payer: Self-pay

## 2018-08-28 VITALS — BP 134/84 | HR 61

## 2018-08-28 DIAGNOSIS — I480 Paroxysmal atrial fibrillation: Secondary | ICD-10-CM | POA: Diagnosis not present

## 2018-08-28 MED ORDER — DILTIAZEM HCL ER COATED BEADS 120 MG PO CP24: 120.0000 mg | ORAL_CAPSULE | Freq: Every day | ORAL | 2 refills | Status: DC

## 2018-08-28 NOTE — Progress Notes (Signed)
Electrophysiology TeleHealth Note   Due to national recommendations of social distancing due to [place] 19, Audio telehealth visit is felt to be most appropriate for this patient at this time.  See consent below from today for patient consent regarding telehealth for the Atrial Fibrillation Clinic. Consent obtained verbally.   Date:  08/28/2018   ID:  Brandon Alt., DOB Feb 27, 1946, MRN [id number]  Location: home  Provider location: [address] [place], [place] [address] Evaluation Performed: Follow up  PCP:  Leanna Battles, MD  Primary Cardiologist:  Dr Percival Spanish Primary Electrophysiologist: Dr Rayann Heman   CC: Follow up for atrial fibrillation    History of Present Illness: Brandon Domingo Fuson. is a 73 y.o. male who presents via audio conferencing for a telehealth visit today. 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 He was recently hospitalized after presenting with heart racing, chest discomfort, and SOB. He was found to be in afib with RVR. He has recently been weaning down his AAD because he had done so well post ablation. He was restarted on his flecainide and diltiazem and has done well for the last several days with minimal palpitations.   Today, he denies symptoms of chest pain, shortness of breath, orthopnea, PND, lower extremity edema, claudication, dizziness, presyncope, syncope, bleeding, or neurologic sequela. The patient is tolerating medications without difficulties and is otherwise without complaint today.   he denies symptoms of cough, fevers, chills, or new SOB worrisome for COVID 19.      he has a BMI of There is no height or weight on file to calculate BMI..  BP 134/84 Pulse 61 Provided by pt home BP machine  Past Medical History:  Diagnosis Date   Aortic atherosclerosis  ([place])    Arthritis    Asthma    Diverticulitis    Diverticulosis    Ectatic thoracic aorta ([place])    a. 4.3cm by CT 03/2018.   GERD (gastroesophageal reflux disease)    Hemorrhoids    History of echocardiogram    a. Echo 10/16 ([place]):  EF 55%, trace MR, mild TR, mild to mod AI, mild dilated Ao root (41 mm)   Hyperlipemia    Organic erectile dysfunction    PAF (paroxysmal atrial fibrillation) ([place])    a. admx to [facility] in [place], [place] 10/16 with AF with RVR >> converted to NSR with IV Dilt;  b. Flecainide started >> ETT neg for pro-arrhythmia    Prostate cancer ([place]) 04/25/2006   Gleason 3+4=7   Prostatitis    S/P cardiac cath    a. LHC at [facility] 10/16:  LM ok, mLAD 30%, LCx ok, dRCA 20%   S/P radiation therapy 01/19/2014 through 03/05/2014                                                      Prostate bed 6600 cGy in 33 sessions                           Torn rotator cuff    right  40% tear   Past Surgical History:  Procedure Laterality Date   ABLATION OF DYSRHYTHMIC FOCUS  03/28/2018   APPENDECTOMY     1967   ATRIAL FIBRILLATION ABLATION N/A 03/28/2018   Procedure: ATRIAL FIBRILLATION ABLATION;  Surgeon: [name], MD;  Location: [facility];  Service: Cardiovascular;  Laterality: N/A;   cataract surgery Left    COLONOSCOPY     KNEE ARTHROSCOPY     R   LARYNX SURGERY     vocal cord lesion- benign   LUMBAR LAMINECTOMY/DECOMPRESSION MICRODISCECTOMY Right 07/26/2017   Procedure: RIGHT LUMBAR 3- LUMBAR 4 FORAMINOTOMY WITH RESECTION OF SYNOVIAL CYST;  Surgeon: Erline Levine, MD;  Location: [facility];  Service: Neurosurgery;  Laterality: Right;  RIGHT LUMBAR 3- LUMBAR 4 FORAMINOTOMY WITH RESECTION OF SYNOVIAL CYST   POLYPECTOMY     PROSTATECTOMY  04/25/2006     Current Outpatient Medications  Medication Sig Dispense Refill   apixaban (ELIQUIS) 5 MG TABS tablet Take 5 mg by mouth 2 (two) times daily.      atorvastatin  (LIPITOR) 20 MG tablet Take 20 mg by mouth daily.      cholecalciferol (VITAMIN D) 1000 UNITS tablet Take 1,000 Units by mouth daily.     diltiazem (CARDIZEM CD) 180 MG 24 hr capsule Take 1 capsule (180 mg total) by mouth daily. 90 capsule 3   flecainide (TAMBOCOR) 100 MG tablet Take 0.5 tablets (50 mg total) by mouth 2 (two) times daily.     fluticasone (FLONASE) 50 MCG/ACT nasal spray Place 1 spray into both nostrils daily.      HYDROcodone-acetaminophen (NORCO/VICODIN) 5-325 MG tablet Take 1-2 tablets by mouth every 6 (six) hours as needed for severe pain. 60 tablet 0   Mometasone Furoate (ASMANEX HFA) 100 MCG/ACT AERO Inhale 1 puff into the lungs daily.      montelukast (SINGULAIR) 10 MG tablet Take 10 mg by mouth at bedtime.     Multiple Vitamin (MULTIVITAMIN) capsule Take 1 capsule by mouth daily.     omeprazole (PRILOSEC) 20 MG capsule Take 20 mg by mouth 2 (two) times daily before a meal.      Probiotic Product (ALIGN) 4 MG CAPS Take 4 mg by mouth daily.      psyllium (METAMUCIL) 58.6 % powder Take 1 packet by mouth 2 (two) times daily.     vitamin C (ASCORBIC ACID) 500 MG tablet Take 500 mg by mouth daily.     diltiazem (CARDIZEM) 30 MG tablet Take 1 tablet every 4 hours AS NEEDED for afib rapid heart rate over 100 (Patient not taking: Reported on 08/28/2018) 45 tablet 2   methocarbamol (ROBAXIN) 500 MG tablet Take 1 tablet (500 mg total) by mouth every 6 (six) hours as needed for muscle spasms. (Patient not taking: Reported on 08/28/2018) 60 tablet 1   No current facility-administered medications for this encounter.     Allergies:   Patient has no known allergies.   Social History:  The patient  reports that he has quit smoking. His smoking use included cigarettes. He has a 10.00 pack-year smoking history. He has quit using smokeless tobacco. He reports current alcohol use of about 7.0 standard drinks of alcohol per week. He reports that he does not use drugs.   Family  History:  The patient's  family history includes Atrial fibrillation in his son; Diabetes in his maternal grandmother; Emphysema in his father; Heart disease in his maternal grandfather and paternal grandfather; Hypertension in his mother; Liver cancer in his paternal grandmother and another family member;  Lung cancer in his father and another family member.    ROS:  Please see the history of present illness.   All other systems are personally reviewed and negative.    Recent Labs: 02/11/2018: Magnesium 2.2 08/23/2018: ALT 24; BUN 17; Creatinine, Ser 1.31; Hemoglobin 15.4; Platelets 159; Potassium 3.8; Sodium 143  personally reviewed    Other studies personally reviewed: Additional studies/ records that were reviewed today include: Epic notes   The patient presents wearable device technology report for my review today. On my review, the patient presents Kardia tracings from 08/27/18 . The tracings reveal sinus rhythm HR 60s.    ASSESSMENT AND PLAN:  1.  Paroxysmal atrial fibrillation Patient s/p ablation 03/2018. Had been doing well until last week.  Will continue flecainide 50 mg BID Will decrease diltiazem to 120 mg daily 2/2 leg edema. Can increase back to 180 mg daily if palpitations worsen. Continue Eliquis 5 mg BID Continue diltiazem 30 mg PRN heart racing.  This patients CHA2DS2-VASc Score and unadjusted Ischemic Stroke Rate (% per year) is equal to 3.2 % stroke rate/year from a score of 3  Above score calculated as 1 point each if present [CHF, HTN, DM, Vascular=MI/PAD/Aortic Plaque, Age if 65-74, or Male] Above score calculated as 2 points each if present [Age > 75, or Stroke/TIA/TE]   COVID screen The patient does not have any symptoms that suggest any further testing/ screening at this time.  Social distancing reinforced today.    Follow-up with Dr Percival Spanish and Dr Rayann Heman as scheduled. AF clinic in 4 months.  Current medicines are reviewed at length with the patient  today.   The patient does not have concerns regarding his medicines.  The following changes were made today:  Decrease diltiazem   Labs/ tests ordered today include:  No orders of the defined types were placed in this encounter.   Patient Risk:  after full review of this patients clinical status, I feel that they are at moderate risk at this time.   Today, I have spent 14 minutes with the patient with telehealth technology discussing atrial fibrillation, medications, and COVID-19 precautions.    Gwenlyn Perking PA-C 08/28/2018 11:23 AM  Afib [facility] [address] [place], [place] [address] [phone]   I hereby voluntarily request, consent and authorize the [facility] and its employed or contracted physicians, physician assistants, nurse practitioners or other licensed health care professionals (the Practitioner), to provide me with telemedicine health care services (the "Services") as deemed necessary by the treating Practitioner. I acknowledge and consent to receive the Services by the Practitioner via telemedicine. I understand that the telemedicine visit will involve communicating with the Practitioner through live audiovisual communication technology and the disclosure of certain medical information by electronic transmission. I acknowledge that I have been given the opportunity to request an in-person assessment or other available alternative prior to the telemedicine visit and am voluntarily participating in the telemedicine visit.   I understand that I have the right to withhold or withdraw my consent to the use of telemedicine in the course of my care at any time, without affecting my right to future care or treatment, and that the Practitioner or I may terminate the telemedicine visit at any time. I understand that I have the right to inspect all information obtained and/or recorded in the course of the telemedicine visit and may receive  copies of available information for a reasonable fee.  I understand that some of  the potential risks of receiving the Services via telemedicine include:   Delay or interruption in medical evaluation due to technological equipment failure or disruption;  Information transmitted may not be sufficient (e.g. poor resolution of images) to allow for appropriate medical decision making by the Practitioner; and/or  In rare instances, security protocols could fail, causing a breach of personal health information.   Furthermore, I acknowledge that it is my responsibility to provide information about my medical history, conditions and care that is complete and accurate to the best of my ability. I acknowledge that Practitioner's advice, recommendations, and/or decision may be based on factors not within their control, such as incomplete or inaccurate data provided by me or distortions of diagnostic images or specimens that may result from electronic transmissions. I understand that the practice of medicine is not an exact science and that Practitioner makes no warranties or guarantees regarding treatment outcomes. I acknowledge that I will receive a copy of this consent concurrently upon execution via email to the email address I last provided but may also request a printed copy by calling the office of the [facility].  I understand that my insurance will be billed for this visit.   I have read or had this consent read to me.  I understand the contents of this consent, which adequately explains the benefits and risks of the Services being provided via telemedicine.  I have been provided ample opportunity to ask questions regarding this consent and the Services and have had my questions answered to my satisfaction.  I give my informed consent for the services to be provided through the use of telemedicine in my medical care  By participating in this telemedicine visit I agree to the above.

## 2018-08-29 ENCOUNTER — Telehealth: Payer: Medicare Other | Admitting: Cardiology

## 2018-09-01 ENCOUNTER — Other Ambulatory Visit: Payer: Self-pay | Admitting: Internal Medicine

## 2018-09-10 DIAGNOSIS — J301 Allergic rhinitis due to pollen: Secondary | ICD-10-CM | POA: Diagnosis not present

## 2018-09-10 DIAGNOSIS — J3089 Other allergic rhinitis: Secondary | ICD-10-CM | POA: Diagnosis not present

## 2018-09-11 ENCOUNTER — Other Ambulatory Visit (HOSPITAL_COMMUNITY): Payer: Self-pay | Admitting: *Deleted

## 2018-09-11 MED ORDER — FLECAINIDE ACETATE 50 MG PO TABS: 50.0000 mg | ORAL_TABLET | Freq: Two times a day (BID) | ORAL | 3 refills | Status: DC

## 2018-09-18 NOTE — Progress Notes (Signed)
Virtual Visit via Video Note   This visit type was conducted due to national recommendations for restrictions regarding the COVID-19 Pandemic (e.g. social distancing) in an effort to limit this patient's exposure and mitigate transmission in our community.  Due to his co-morbid illnesses, this patient is at least at moderate risk for complications without adequate follow up.  This format is felt to be most appropriate for this patient at this time.  All issues noted in this document were discussed and addressed.  A limited physical exam was performed with this format.  Please refer to the patient's chart for his consent to telehealth for [facility]   Date:  09/19/2018   ID:  Brandon Alt., DOB 05/26/1945, MRN [id number]  Patient Location: Home Provider Location: Home  PCP:  Leanna Battles, MD  Cardiologist:  Minus Breeding, MD  Electrophysiologist:  [name], MD   Evaluation Performed:  Follow-Up Visit  Chief Complaint:  Atrial fib  History of Present Illness:    Brandon Drayven Marchena. is a 73 y.o. male with who presents for evaluation of atrial fib. He has been followed in the atrial fib clinic.  He had an echo which was normal with an EF of 65%.    Since I last saw him he was back in the emergency room last month with atrial fibrillation.  He had a rapid rate.  He did convert spontaneously to sinus rhythm but because of chest pain he was observed.  His enzymes were negative.  He had an echocardiogram that was unremarkable.  He had been weaned down off of his flecainide and his diltiazem and when he left the hospital he was sent home on 180 mg of Cardizem and 50 mg twice a day of flecainide.  He since had his Cardizem reduced back down to 120 mg.  Has had a couple of fleeting palpitations.  He sent a tracing today just for [place] to look at as he was having this appointment and it appeared to be sinus although there was such baseline artifact I could not absolutely see the P waves.   It was certainly not fibrillation and was a regular rhythm.  He was not having any symptoms related to this.  He has been hiking.  He has been doing well.  He has had a little lower extremity swelling which started again after he started Cardizem back.  However, this is not been problematic.  He is not been having any new shortness of breath, PND or orthopnea.   he had removal of a cyst from his spine.  Since I last saw him he has done well.  He is exercising routinely.  The patient denies any new symptoms such as neck or arm discomfort. There has been no new shortness of breath, PND or orthopnea. There have been no reported palpitations, presyncope or syncope.   He did have one episode of chest discomfort one night while watching TV.  Seem to radiate up from his epigastric area up to his chest and then went away.  He has not been able to reproduce this or has not had this with any of his activity.  He is never had this before.  He has had no symptomatic recurrence of this recently.     The patient does not have symptoms concerning for COVID-19 infection (fever, chills, cough, or new shortness of breath).    Past Medical History:  Diagnosis Date   Aortic atherosclerosis ([place])    Arthritis  Asthma    Diverticulitis    Diverticulosis    Ectatic thoracic aorta ([place])    a. 4.3cm by CT 03/2018.   GERD (gastroesophageal reflux disease)    Hemorrhoids    History of echocardiogram    a. Echo 10/16 ([place]):  EF 55%, trace MR, mild TR, mild to mod AI, mild dilated Ao root (41 mm)   Hyperlipemia    Organic erectile dysfunction    PAF (paroxysmal atrial fibrillation) ([place])    a. admx to [facility] in [place], [place] 10/16 with AF with RVR >> converted to NSR with IV Dilt;  b. Flecainide started >> ETT neg for pro-arrhythmia    Prostate cancer ([place]) 04/25/2006   Gleason 3+4=7   Prostatitis    S/P cardiac cath    a. LHC at [facility] 10/16:  LM ok, mLAD 30%, LCx ok,  dRCA 20%   S/P radiation therapy 01/19/2014 through 03/05/2014                                                      Prostate bed 6600 cGy in 33 sessions                           Torn rotator cuff    right  40% tear   Past Surgical History:  Procedure Laterality Date   ABLATION OF DYSRHYTHMIC FOCUS  03/28/2018   APPENDECTOMY     1967   ATRIAL FIBRILLATION ABLATION N/A 03/28/2018   Procedure: ATRIAL FIBRILLATION ABLATION;  Surgeon: [name], MD;  Location: [place] [facility];  Service: Cardiovascular;  Laterality: N/A;   cataract surgery Left    COLONOSCOPY     KNEE ARTHROSCOPY     R   LARYNX SURGERY     vocal cord lesion- benign   LUMBAR LAMINECTOMY/DECOMPRESSION MICRODISCECTOMY Right 07/26/2017   Procedure: RIGHT LUMBAR 3- LUMBAR 4 FORAMINOTOMY WITH RESECTION OF SYNOVIAL CYST;  Surgeon: Erline Levine, MD;  Location: [place];  Service: Neurosurgery;  Laterality: Right;  RIGHT LUMBAR 3- LUMBAR 4 FORAMINOTOMY WITH RESECTION OF SYNOVIAL CYST   POLYPECTOMY     PROSTATECTOMY  04/25/2006     Current Meds  Medication Sig   apixaban (ELIQUIS) 5 MG TABS tablet Take 5 mg by mouth 2 (two) times daily.    atorvastatin (LIPITOR) 20 MG tablet Take 20 mg by mouth daily.    cholecalciferol (VITAMIN D) 1000 UNITS tablet Take 1,000 Units by mouth daily.   diltiazem (CARDIZEM CD) 120 MG 24 hr capsule Take 1 capsule (120 mg total) by mouth daily.   diltiazem (CARDIZEM) 30 MG tablet Take 1 tablet every 4 hours AS NEEDED for afib rapid heart rate over 100   flecainide (TAMBOCOR) 50 MG tablet Take 1 tablet (50 mg total) by mouth 2 (two) times daily.   fluticasone (FLONASE) 50 MCG/ACT nasal spray Place 1 spray into both nostrils daily.    HYDROcodone-acetaminophen (NORCO/VICODIN) 5-325 MG tablet Take 1-2 tablets by mouth every 6 (six) hours as needed for severe pain.   methocarbamol (ROBAXIN) 500 MG tablet Take 1 tablet (500 mg total) by mouth every 6 (six) hours as needed for muscle  spasms.   Mometasone Furoate (ASMANEX HFA) 100 MCG/ACT AERO Inhale 1 puff into the lungs daily.  montelukast (SINGULAIR) 10 MG tablet Take 10 mg by mouth at bedtime.   Multiple Vitamin (MULTIVITAMIN) capsule Take 1 capsule by mouth daily.   omeprazole (PRILOSEC) 20 MG capsule Take 20 mg by mouth 2 (two) times daily before a meal.    Probiotic Product (ALIGN) 4 MG CAPS Take 4 mg by mouth daily.    psyllium (METAMUCIL) 58.6 % powder Take 1 packet by mouth 2 (two) times daily.   vitamin C (ASCORBIC ACID) 500 MG tablet Take 500 mg by mouth daily.     Allergies:   Patient has no known allergies.   Social History   Tobacco Use   Smoking status: Former Smoker    Packs/day: 1.00    Years: 10.00    Pack years: 10.00    Types: Cigarettes   Smokeless tobacco: Former Systems developer   Tobacco comment: 1972  Substance Use Topics   Alcohol use: Yes    Alcohol/week: 7.0 standard drinks    Types: 7 Glasses of wine per week    Comment: 1 glass wine or beer daily   Drug use: No     Family Hx: The patient's family history includes Atrial fibrillation in his son; Diabetes in his maternal grandmother; Emphysema in his father; Heart disease in his maternal grandfather and paternal grandfather; Hypertension in his mother; Liver cancer in his paternal grandmother and another family member; Lung cancer in his father and another family member.  ROS:   Please see the history of present illness.    As stated in the HPI and negative for all other systems.   Prior CV studies:   The following studies were reviewed today:  Hospital records  Labs/Other Tests and Data Reviewed:    EKG:  The patient's [facility] cardiac telemetry strip(s) personally reviewed today demonstrate:  NSR likely.   Baseline artifact.  Regular ventricular rhythm  Recent Labs: 02/11/2018: Magnesium 2.2 08/23/2018: ALT 24; BUN 17; Creatinine, Ser 1.31; Hemoglobin 15.4; Platelets 159; Potassium 3.8; Sodium 143   Recent  Lipid Panel No results found for: CHOL, TRIG, HDL, CHOLHDL, LDLCALC, LDLDIRECT  Wt Readings from Last 3 Encounters:  09/19/18 215 lb (97.5 kg)  04/26/18 216 lb (98 kg)  03/29/18 215 lb 12.8 oz (97.9 kg)     Objective:    Vital Signs:  BP 127/75    Pulse 64    Ht 5\' 10"  (1.778 m)    Wt 215 lb (97.5 kg)    BMI 30.85 kg/m    VITAL SIGNS:  reviewed GEN:  no acute distress EYES:  sclerae anicteric, EOMI - Extraocular Movements Intact RESPIRATORY:  normal respiratory effort, symmetric expansion NEURO:  alert and oriented x 3, no obvious focal deficit PSYCH:  normal affect  ASSESSMENT & PLAN:    ATRIAL FIB:   He now is doing well back on a low-dose flecainide and Cardizem following his atrial fibrillation ablation.  We talked about what he should do should he have any further events.  He would take the Cardizem immediate release but he also understands he should come back to the emergency room if he has sustained tacky arrhythmia and symptoms.  CHEST PAIN: He had this when he was in the hospital but he is doing well and had no enzyme elevation.  No further work-up.  He has no further symptoms.  EDEMA: This is mild and we talked about conservative therapies.    Time:   Today, I have spent 26 minutes with the patient with telehealth technology discussing  the above problems.     Medication Adjustments/Labs and Tests Ordered: Current medicines are reviewed at length with the patient today.  Concerns regarding medicines are outlined above.   Tests Ordered: No orders of the defined types were placed in this encounter.   Medication Changes: No orders of the defined types were placed in this encounter.   Follow Up:  With Dr. Rayann Heman Signed, Minus Breeding, MD  09/19/2018 5:14 PM    [place]

## 2018-09-19 ENCOUNTER — Telehealth (INDEPENDENT_AMBULATORY_CARE_PROVIDER_SITE_OTHER): Payer: Medicare Other | Admitting: Cardiology

## 2018-09-19 ENCOUNTER — Encounter: Payer: Self-pay | Admitting: Cardiology

## 2018-09-19 VITALS — BP 127/75 | HR 64 | Ht 70.0 in | Wt 215.0 lb

## 2018-09-19 DIAGNOSIS — I480 Paroxysmal atrial fibrillation: Secondary | ICD-10-CM

## 2018-09-19 NOTE — Patient Instructions (Signed)
Medication Instructions:  Your physician recommends that you continue on your current medications as directed. Please refer to the Current Medication list given to you today.  If you need a refill on your cardiac medications before your next appointment, please call your pharmacy.   Lab work: NONE  Testing/Procedures: NONE  Follow-Up: AS NEEDED   

## 2018-09-24 DIAGNOSIS — J3089 Other allergic rhinitis: Secondary | ICD-10-CM | POA: Diagnosis not present

## 2018-09-25 DIAGNOSIS — L300 Nummular dermatitis: Secondary | ICD-10-CM | POA: Diagnosis not present

## 2018-09-25 DIAGNOSIS — D2261 Melanocytic nevi of right upper limb, including shoulder: Secondary | ICD-10-CM | POA: Diagnosis not present

## 2018-09-25 DIAGNOSIS — D2272 Melanocytic nevi of left lower limb, including hip: Secondary | ICD-10-CM | POA: Diagnosis not present

## 2018-09-25 DIAGNOSIS — D485 Neoplasm of uncertain behavior of skin: Secondary | ICD-10-CM | POA: Diagnosis not present

## 2018-09-25 DIAGNOSIS — D225 Melanocytic nevi of trunk: Secondary | ICD-10-CM | POA: Diagnosis not present

## 2018-09-25 DIAGNOSIS — L821 Other seborrheic keratosis: Secondary | ICD-10-CM | POA: Diagnosis not present

## 2018-09-25 DIAGNOSIS — D2262 Melanocytic nevi of left upper limb, including shoulder: Secondary | ICD-10-CM | POA: Diagnosis not present

## 2018-09-25 DIAGNOSIS — D2271 Melanocytic nevi of right lower limb, including hip: Secondary | ICD-10-CM | POA: Diagnosis not present

## 2018-09-30 ENCOUNTER — Telehealth: Payer: Medicare Other | Admitting: Cardiology

## 2018-10-02 ENCOUNTER — Encounter (HOSPITAL_COMMUNITY): Payer: Self-pay

## 2018-10-03 ENCOUNTER — Telehealth (HOSPITAL_COMMUNITY): Payer: Self-pay | Admitting: Nurse Practitioner

## 2018-10-03 NOTE — Telephone Encounter (Signed)
Pt called to clinic saying that he went back into afib with V rate around 135 bpm this after noon. BP is 142/70. Will increase flecainide to 100 mg bid from 50 mg bid, which was his previous dose. He can go ahead and take 30 mg cardizem now and go back to 180 mg daily, which he takes in the pm. He will call in am with report of how he is feeling and his rhythm.Marland Kitchen

## 2018-10-08 DIAGNOSIS — L821 Other seborrheic keratosis: Secondary | ICD-10-CM | POA: Diagnosis not present

## 2018-10-08 DIAGNOSIS — D485 Neoplasm of uncertain behavior of skin: Secondary | ICD-10-CM | POA: Diagnosis not present

## 2018-10-08 DIAGNOSIS — L988 Other specified disorders of the skin and subcutaneous tissue: Secondary | ICD-10-CM | POA: Diagnosis not present

## 2018-10-08 DIAGNOSIS — J301 Allergic rhinitis due to pollen: Secondary | ICD-10-CM | POA: Diagnosis not present

## 2018-10-08 DIAGNOSIS — J3089 Other allergic rhinitis: Secondary | ICD-10-CM | POA: Diagnosis not present

## 2018-10-24 ENCOUNTER — Telehealth: Payer: Self-pay

## 2018-10-24 NOTE — Telephone Encounter (Signed)
Spoke with pt regarding appt on 10/28/18. Pt was advise to check vitals before appt. Pt stated he did not have any questions at this time.

## 2018-10-28 ENCOUNTER — Telehealth (INDEPENDENT_AMBULATORY_CARE_PROVIDER_SITE_OTHER): Payer: Medicare Other | Admitting: Internal Medicine

## 2018-10-28 ENCOUNTER — Encounter: Payer: Self-pay | Admitting: Internal Medicine

## 2018-10-28 VITALS — BP 120/71 | HR 50 | Ht 70.0 in | Wt 215.0 lb

## 2018-10-28 DIAGNOSIS — I480 Paroxysmal atrial fibrillation: Secondary | ICD-10-CM | POA: Diagnosis not present

## 2018-10-28 DIAGNOSIS — J301 Allergic rhinitis due to pollen: Secondary | ICD-10-CM | POA: Diagnosis not present

## 2018-10-28 DIAGNOSIS — J3089 Other allergic rhinitis: Secondary | ICD-10-CM | POA: Diagnosis not present

## 2018-10-28 NOTE — Progress Notes (Signed)
Electrophysiology TeleHealth Note   Due to national recommendations of social distancing due to COVID 19, an audio/video telehealth visit is felt to be most appropriate for this patient at this time.  See MyChart message from today for the patient's consent to telehealth for [facility].   Date:  10/28/2018   ID:  [name], DOB Nov 07, 1945, MRN [id number]  Location: patient's home  Provider location:  [facility]  Evaluation Performed: Follow-up visit  PCP:  Leanna Battles, MD   Electrophysiologist:  Dr Rayann Heman  Chief Complaint:  afib  History of Present Illness:    Keyton Oren Section. is a 73 y.o. male who presents via telehealth conferencing today.  Since last being seen in our clinic, the patient reports doing very well.  He did have AF after stopping flecainide for which he required hospitalization.  Since restarting flecainide, he has done well without further AF.  Edema is stable.  Today, he denies symptoms of palpitations, chest pain, shortness of breath,  dizziness, presyncope, or syncope.  The patient is otherwise without complaint today.  The patient denies symptoms of fevers, chills, cough, or new SOB worrisome for COVID 19.  Past Medical History:  Diagnosis Date  . Aortic atherosclerosis ([place])   . Arthritis   . Asthma   . Diverticulitis   . Diverticulosis   . Ectatic thoracic aorta ([place])    a. 4.3cm by CT 03/2018.  Marland Kitchen GERD (gastroesophageal reflux disease)   . Hemorrhoids   . History of echocardiogram    a. Echo 10/16 [facility]):  EF 55%, trace MR, mild TR, mild to mod AI, mild dilated Ao root (41 mm)  . Hyperlipemia   . Organic erectile dysfunction   . PAF (paroxysmal atrial fibrillation) ([place])    a. admx to [facility] in [place], [place] 10/16 with AF with RVR >> converted to NSR with IV Dilt;  b. Flecainide started >> ETT neg for pro-arrhythmia   . Prostate cancer ([place]) 04/25/2006   Gleason 3+4=7  . Prostatitis   . S/P cardiac cath     a. LHC at [facility] 10/16:  LM ok, mLAD 30%, LCx ok, dRCA 20%  . S/P radiation therapy 01/19/2014 through 03/05/2014                                                      Prostate bed 6600 cGy in 33 sessions                          . Torn rotator cuff    right  40% tear    Past Surgical History:  Procedure Laterality Date  . ABLATION OF DYSRHYTHMIC FOCUS  03/28/2018  . APPENDECTOMY     1967  . ATRIAL FIBRILLATION ABLATION N/A 03/28/2018   Procedure: ATRIAL FIBRILLATION ABLATION;  Surgeon: [name], MD;  Location: [place] [facility];  Service: Cardiovascular;  Laterality: N/A;  . cataract surgery Left   . COLONOSCOPY    . KNEE ARTHROSCOPY     R  . LARYNX SURGERY     vocal cord lesion- benign  . LUMBAR LAMINECTOMY/DECOMPRESSION MICRODISCECTOMY Right 07/26/2017   Procedure: RIGHT LUMBAR 3- LUMBAR 4 FORAMINOTOMY WITH RESECTION OF SYNOVIAL CYST;  Surgeon: Erline Levine, MD;  Location:  [place] OR;  Service: Neurosurgery;  Laterality: Right;  RIGHT LUMBAR 3- LUMBAR 4 FORAMINOTOMY WITH RESECTION OF SYNOVIAL CYST  . POLYPECTOMY    . PROSTATECTOMY  04/25/2006    Current Outpatient Medications  Medication Sig Dispense Refill  . apixaban (ELIQUIS) 5 MG TABS tablet Take 5 mg by mouth 2 (two) times daily.     . cholecalciferol (VITAMIN D) 1000 UNITS tablet Take 1,000 Units by mouth daily.    Marland Kitchen diltiazem (CARDIZEM) 30 MG tablet Take 1 tablet every 4 hours AS NEEDED for afib rapid heart rate over 100 45 tablet 2  . diltiazem (DILTIAZEM CD) 180 MG 24 hr capsule Take 1 capsule by mouth daily.    . flecainide (TAMBOCOR) 100 MG tablet Take 1 tablet by mouth 2 (two) times daily.    . fluticasone (FLONASE) 50 MCG/ACT nasal spray Place 1 spray into both nostrils daily.     Marland Kitchen HYDROcodone-acetaminophen (NORCO/VICODIN) 5-325 MG tablet Take 1-2 tablets by mouth every 6 (six) hours as needed for severe pain. 60 tablet 0  . methocarbamol (ROBAXIN) 500 MG tablet Take 1 tablet (500 mg total) by mouth every 6  (six) hours as needed for muscle spasms. 60 tablet 1  . Mometasone Furoate (ASMANEX HFA) 100 MCG/ACT AERO Inhale 1 puff into the lungs daily.     . montelukast (SINGULAIR) 10 MG tablet Take 10 mg by mouth at bedtime.    . Multiple Vitamin (MULTIVITAMIN) capsule Take 1 capsule by mouth daily.    Marland Kitchen omeprazole (PRILOSEC) 20 MG capsule Take 20 mg by mouth 2 (two) times daily before a meal.     . Probiotic Product (ALIGN) 4 MG CAPS Take 4 mg by mouth daily.     . psyllium (METAMUCIL) 58.6 % powder Take 1 packet by mouth 2 (two) times daily.    . vitamin C (ASCORBIC ACID) 500 MG tablet Take 500 mg by mouth daily.    Marland Kitchen atorvastatin (LIPITOR) 40 MG tablet Take 20 mg by mouth daily.     No current facility-administered medications for this visit.     Allergies:   Patient has no known allergies.   Social History:  The patient  reports that he has quit smoking. His smoking use included cigarettes. He has a 10.00 pack-year smoking history. He has quit using smokeless tobacco. He reports current alcohol use of about 7.0 standard drinks of alcohol per week. He reports that he does not use drugs.   Family History:  The patient's family history includes Atrial fibrillation in his son; Diabetes in his maternal grandmother; Emphysema in his father; Heart disease in his maternal grandfather and paternal grandfather; Hypertension in his mother; Liver cancer in his paternal grandmother and another family member; Lung cancer in his father and another family member.   ROS:  Please see the history of present illness.   All other systems are personally reviewed and negative.    Exam:    Vital Signs:  BP 120/71   Pulse (!) 50   Ht 5\' 10"  (1.778 m)   Wt 215 lb (97.5 kg)   BMI 30.85 kg/m   Well sounding and appearing, alert and conversant, regular work of breathing,  good skin color Eyes- anicteric, neuro- grossly intact, skin- no apparent rash or lesions or cyanosis, mouth- oral mucosa is pink  Labs/Other  Tests and Data Reviewed:    Recent Labs: 02/11/2018: Magnesium 2.2 08/23/2018: ALT 24; BUN 17; Creatinine, Ser 1.31; Hemoglobin 15.4; Platelets 159; Potassium 3.8; Sodium 143  Wt Readings from Last 3 Encounters:  10/28/18 215 lb (97.5 kg)  09/19/18 215 lb (97.5 kg)  04/26/18 216 lb (98 kg)      ASSESSMENT & PLAN:    1.  Paroxysmal atrial fibrillation Well controlled with flecainide We discussed repeat ablation.  He would prefer to continue flecainide presently since it is working so well.   Follow-up:  6 months with me   Patient Risk:  after full review of this patients clinical status, I feel that they are at moderate risk at this time.  Today, I have spent 15 minutes with the patient with telehealth technology discussing arrhythmia management .    Army Fossa, MD  10/28/2018 9:56 AM     [facility] [address] [place] [address] [phone] (office) [phone] (fax)

## 2018-11-08 DIAGNOSIS — C61 Malignant neoplasm of prostate: Secondary | ICD-10-CM | POA: Diagnosis not present

## 2018-11-11 DIAGNOSIS — J3089 Other allergic rhinitis: Secondary | ICD-10-CM | POA: Diagnosis not present

## 2018-11-11 DIAGNOSIS — J301 Allergic rhinitis due to pollen: Secondary | ICD-10-CM | POA: Diagnosis not present

## 2018-11-13 DIAGNOSIS — C61 Malignant neoplasm of prostate: Secondary | ICD-10-CM | POA: Diagnosis not present

## 2018-11-18 ENCOUNTER — Other Ambulatory Visit: Payer: Self-pay | Admitting: Internal Medicine

## 2018-11-19 ENCOUNTER — Other Ambulatory Visit (HOSPITAL_COMMUNITY): Payer: Self-pay | Admitting: *Deleted

## 2018-11-19 ENCOUNTER — Other Ambulatory Visit: Payer: Self-pay

## 2018-11-19 MED ORDER — FLECAINIDE ACETATE 100 MG PO TABS: 100.0000 mg | ORAL_TABLET | Freq: Two times a day (BID) | ORAL | 2 refills | Status: DC

## 2018-11-26 DIAGNOSIS — J301 Allergic rhinitis due to pollen: Secondary | ICD-10-CM | POA: Diagnosis not present

## 2018-11-26 DIAGNOSIS — J3089 Other allergic rhinitis: Secondary | ICD-10-CM | POA: Diagnosis not present

## 2018-12-09 ENCOUNTER — Other Ambulatory Visit (HOSPITAL_COMMUNITY): Payer: Self-pay

## 2018-12-09 DIAGNOSIS — I480 Paroxysmal atrial fibrillation: Secondary | ICD-10-CM

## 2018-12-09 MED ORDER — DILTIAZEM HCL 30 MG PO TABS: ORAL_TABLET | ORAL | 1 refills | Status: DC

## 2018-12-11 DIAGNOSIS — J3089 Other allergic rhinitis: Secondary | ICD-10-CM | POA: Diagnosis not present

## 2018-12-11 DIAGNOSIS — J301 Allergic rhinitis due to pollen: Secondary | ICD-10-CM | POA: Diagnosis not present

## 2018-12-12 DIAGNOSIS — Z23 Encounter for immunization: Secondary | ICD-10-CM | POA: Diagnosis not present

## 2018-12-26 ENCOUNTER — Ambulatory Visit (HOSPITAL_COMMUNITY): Payer: Medicare Other | Admitting: Nurse Practitioner

## 2018-12-31 ENCOUNTER — Ambulatory Visit (HOSPITAL_COMMUNITY)
Admission: RE | Admit: 2018-12-31 | Discharge: 2018-12-31 | Disposition: A | Discharge status: Home / Self Care | Payer: Medicare Other | Source: Ambulatory Visit | Attending: Nurse Practitioner | Admitting: Nurse Practitioner

## 2018-12-31 ENCOUNTER — Encounter (HOSPITAL_COMMUNITY): Payer: Self-pay | Admitting: Nurse Practitioner

## 2018-12-31 ENCOUNTER — Other Ambulatory Visit: Payer: Self-pay

## 2018-12-31 VITALS — BP 150/74 | HR 60 | Ht 70.0 in | Wt 222.6 lb

## 2018-12-31 DIAGNOSIS — K219 Gastro-esophageal reflux disease without esophagitis: Secondary | ICD-10-CM | POA: Diagnosis not present

## 2018-12-31 DIAGNOSIS — J45909 Unspecified asthma, uncomplicated: Secondary | ICD-10-CM | POA: Diagnosis not present

## 2018-12-31 DIAGNOSIS — Z7901 Long term (current) use of anticoagulants: Secondary | ICD-10-CM | POA: Diagnosis not present

## 2018-12-31 DIAGNOSIS — Z79899 Other long term (current) drug therapy: Secondary | ICD-10-CM | POA: Insufficient documentation

## 2018-12-31 DIAGNOSIS — Z9079 Acquired absence of other genital organ(s): Secondary | ICD-10-CM | POA: Insufficient documentation

## 2018-12-31 DIAGNOSIS — I480 Paroxysmal atrial fibrillation: Secondary | ICD-10-CM | POA: Insufficient documentation

## 2018-12-31 DIAGNOSIS — Z8249 Family history of ischemic heart disease and other diseases of the circulatory system: Secondary | ICD-10-CM | POA: Diagnosis not present

## 2018-12-31 DIAGNOSIS — Z87891 Personal history of nicotine dependence: Secondary | ICD-10-CM | POA: Insufficient documentation

## 2018-12-31 DIAGNOSIS — J301 Allergic rhinitis due to pollen: Secondary | ICD-10-CM | POA: Diagnosis not present

## 2018-12-31 DIAGNOSIS — E785 Hyperlipidemia, unspecified: Secondary | ICD-10-CM | POA: Diagnosis not present

## 2018-12-31 DIAGNOSIS — J3089 Other allergic rhinitis: Secondary | ICD-10-CM | POA: Diagnosis not present

## 2018-12-31 DIAGNOSIS — M1990 Unspecified osteoarthritis, unspecified site: Secondary | ICD-10-CM | POA: Diagnosis not present

## 2018-12-31 NOTE — Progress Notes (Signed)
Primary Care Physician: Leanna Battles, MD Referring Physician:Dr. Geroge Baseman Ardan Asta. is a 73 y.o. male with a h/o paroxysmal afib that is in the afib clinic to f/u ablation that he had  03/2018. He had to go back on daily flecainide and Cardizem as he had some stuttering afib after trying to stop antiarrythmic at 3 month f/u.  He has enjoyed sinus rhythm since being back on daily meds. No c/o today.  Today, he denies symptoms of palpitations, chest pain, shortness of breath, orthopnea, PND, lower extremity edema, dizziness, presyncope, syncope, or neurologic sequela. The patient is tolerating medications without difficulties and is otherwise without complaint today.   Past Medical History:  Diagnosis Date  . Aortic atherosclerosis ([place])   . Arthritis   . Asthma   . Diverticulitis   . Diverticulosis   . Ectatic thoracic aorta ([place])    a. 4.3cm by CT 03/2018.  Marland Kitchen GERD (gastroesophageal reflux disease)   . Hemorrhoids   . History of echocardiogram    a. Echo 10/16 [facility]):  EF 55%, trace MR, mild TR, mild to mod AI, mild dilated Ao root (41 mm)  . Hyperlipemia   . Organic erectile dysfunction   . PAF (paroxysmal atrial fibrillation) ([place])    a. admx to [facility] in [place], [place] 10/16 with AF with RVR >> converted to NSR with IV Dilt;  b. Flecainide started >> ETT neg for pro-arrhythmia   . Prostate cancer ([place]) 04/25/2006   Gleason 3+4=7  . Prostatitis   . S/P cardiac cath    a. LHC at [facility] 10/16:  LM ok, mLAD 30%, LCx ok, dRCA 20%  . S/P radiation therapy 01/19/2014 through 03/05/2014                                                      Prostate bed 6600 cGy in 33 sessions                          . Torn rotator cuff    right  40% tear   Past Surgical History:  Procedure Laterality Date  . ABLATION OF DYSRHYTHMIC FOCUS  03/28/2018  . APPENDECTOMY     1967  . ATRIAL FIBRILLATION ABLATION N/A 03/28/2018   Procedure: ATRIAL FIBRILLATION  ABLATION;  Surgeon: [name], MD;  Location: [place] [facility];  Service: Cardiovascular;  Laterality: N/A;  . cataract surgery Left   . COLONOSCOPY    . KNEE ARTHROSCOPY     R  . LARYNX SURGERY     vocal cord lesion- benign  . LUMBAR LAMINECTOMY/DECOMPRESSION MICRODISCECTOMY Right 07/26/2017   Procedure: RIGHT LUMBAR 3- LUMBAR 4 FORAMINOTOMY WITH RESECTION OF SYNOVIAL CYST;  Surgeon: Erline Levine, MD;  Location: [place];  Service: Neurosurgery;  Laterality: Right;  RIGHT LUMBAR 3- LUMBAR 4 FORAMINOTOMY WITH RESECTION OF SYNOVIAL CYST  . POLYPECTOMY    . PROSTATECTOMY  04/25/2006    Current Outpatient Medications  Medication Sig Dispense Refill  . acetaminophen (TYLENOL) 500 MG tablet Take 500 mg by mouth daily.    Marland Kitchen apixaban (ELIQUIS) 5 MG TABS tablet Take 5 mg by mouth 2 (two) times daily.     Marland Kitchen atorvastatin (LIPITOR) 40 MG tablet Take 20 mg by mouth daily.    Marland Kitchen  cholecalciferol (VITAMIN D) 1000 UNITS tablet Take 1,000 Units by mouth daily.    . diclofenac sodium (VOLTAREN) 1 % GEL Using 1-2 times daily    . diltiazem (CARDIZEM) 30 MG tablet Take 1 tablet every 4 hours AS NEEDED for afib rapid heart rate over 100 (Patient taking differently: Take 1 tablet AS NEEDED for afib rapid heart rate over 100) 45 tablet 1  . diltiazem (DILTIAZEM CD) 180 MG 24 hr capsule Take 1 capsule by mouth daily.    . flecainide (TAMBOCOR) 100 MG tablet TAKE 1 TABLET BY MOUTH TWICE A DAY 180 tablet 3  . fluticasone (FLONASE) 50 MCG/ACT nasal spray Place 1 spray into both nostrils daily.     . Glucosamine-Chondroit-Vit C-Mn (GLUCOSAMINE 1500 COMPLEX PO) Glucosamine    . HYDROcodone-acetaminophen (NORCO/VICODIN) 5-325 MG tablet Take 1-2 tablets by mouth every 6 (six) hours as needed for severe pain. (Patient taking differently: Take by mouth as needed for severe pain. ) 60 tablet 0  . methocarbamol (ROBAXIN) 500 MG tablet Take 1 tablet (500 mg total) by mouth every 6 (six) hours as needed for muscle spasms.  (Patient taking differently: Take 500 mg by mouth as needed for muscle spasms. Takes 1/2 tablet prn) 60 tablet 1  . Mometasone Furoate (ASMANEX HFA) 100 MCG/ACT AERO Inhale 1 puff into the lungs daily.     . montelukast (SINGULAIR) 10 MG tablet Take 10 mg by mouth at bedtime.    . Multiple Vitamin (MULTIVITAMIN) capsule Take 1 capsule by mouth daily.    Marland Kitchen omeprazole (PRILOSEC) 20 MG capsule Take 20 mg by mouth 2 (two) times daily before a meal.     . Probiotic Product (ALIGN) 4 MG CAPS Take 4 mg by mouth daily.     . psyllium (METAMUCIL) 58.6 % powder Take 1 packet by mouth 2 (two) times daily.    . VENTOLIN HFA 108 (90 Base) MCG/ACT inhaler as needed.     . vitamin C (ASCORBIC ACID) 500 MG tablet Take 500 mg by mouth daily.    . flecainide (TAMBOCOR) 50 MG tablet      No current facility-administered medications for this encounter.     No Known Allergies  Social History   Socioeconomic History  . Marital status: Married    Spouse name: Not on file  . Number of children: 2  . Years of education: Not on file  . Highest education level: Not on file  Occupational History  . 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 Occupation: Optometrist  Social Needs  . Financial resource strain: Not on file  . Food insecurity    Worry: Not on file    Inability: Not on file  . Transportation needs    Medical: Not on file    Non-medical: Not on file  Tobacco Use  . Smoking status: Former Smoker    Packs/day: 1.00    Years: 10.00    Pack years: 10.00    Types: Cigarettes  . Smokeless tobacco: Former Systems developer  . Tobacco comment: 1972  Substance and Sexual Activity  . Alcohol use: Yes    Alcohol/week: 7.0 standard drinks    Types: 7 Glasses of wine per week    Comment: 1 glass wine or beer daily  . Drug use: No  . Sexual activity: Not on file  Lifestyle  . Physical activity    Days per week: Not on file    Minutes per session: Not on file  . Stress: Not on file  Relationships  .  Social Product manager on phone: Not on file    Gets together: Not on file    Attends religious service: Not on file    Active member of club or organization: Not on file    Attends meetings of clubs or organizations: Not on file    Relationship status: Not on file  . Intimate partner violence    Fear of current or ex partner: Not on file    Emotionally abused: Not on file    Physically abused: Not on file    Forced sexual activity: Not on file  Other Topics Concern  . Not on file  Social History Narrative   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  Consultant for schools.   Travels over seas     Family History  Problem Relation Age of Onset  . Emphysema Father   . Lung cancer Father   . Hypertension Mother   . Diabetes Maternal Grandmother   . Heart disease Maternal Grandfather        Died age 32  . Liver cancer Paternal Grandmother   . Heart disease Paternal Grandfather        Died age 6  . Liver cancer Other   . Lung cancer Other   . Atrial fibrillation Son     ROS- All systems are reviewed and negative except as per the HPI above  Physical Exam: Vitals:   12/31/18 1408  BP: (!) 150/74  Pulse: 60  Weight: 101 kg  Height: 5\' 10"  (1.778 m)   Wt Readings from Last 3 Encounters:  12/31/18 101 kg  10/28/18 97.5 kg  09/19/18 97.5 kg    Labs: Lab Results  Component Value Date   NA 143 08/23/2018   K 3.8 08/23/2018   CL 110 08/23/2018   CO2 22 08/23/2018   GLUCOSE 122 (H) 08/23/2018   BUN 17 08/23/2018   CREATININE 1.31 (H) 08/23/2018   CALCIUM 9.6 08/23/2018   MG 2.2 02/11/2018   Lab Results  Component Value Date   INR 1.04 02/11/2018   No results found for: CHOL, HDL, LDLCALC, TRIG   GEN- The patient is well appearing, alert and oriented x 3 today.   Head- normocephalic, atraumatic Eyes-  Sclera clear, conjunctiva pink Ears- hearing intact Oropharynx- clear Neck- supple, no JVP Lymph- no cervical lymphadenopathy Lungs- Clear to ausculation bilaterally, normal work of  breathing Heart- Regular rate and rhythm, no murmurs, rubs or gallops, PMI not laterally displaced GI- soft, NT, ND, + BS Extremities- no clubbing, cyanosis, or edema MS- no significant deformity or atrophy Skin- no rash or lesion Psych- euthymic mood, full affect Neuro- strength and sensation are intact  EKG-NSR at 60  bpm, PR int 190 ms, qrs int 104 ms, qtc 402 ms Epic records reviewed    Assessment and Plan: 1. Paroxysmal afib S/p ablation 03/2018 Doing well staying in SR back on AAD/CCB Continue flecainide 100 mg bid  Continue cardizem at 180 mg daily  2. Chadsvasc score of 3(age and DVT after back surgery 2019) Continue  eliquis 5 mg bid, reminded not interrupt anticoagulation for the 3 month recovery period  F/u with Dr. Rayann Heman 04/2019  Geroge Baseman. Lanaiya Lantry, [facility] [address] [place], [place] [address] [phone]

## 2019-01-14 DIAGNOSIS — J301 Allergic rhinitis due to pollen: Secondary | ICD-10-CM | POA: Diagnosis not present

## 2019-01-14 DIAGNOSIS — J3089 Other allergic rhinitis: Secondary | ICD-10-CM | POA: Diagnosis not present

## 2019-01-23 DIAGNOSIS — Z20818 Contact with and (suspected) exposure to other bacterial communicable diseases: Secondary | ICD-10-CM | POA: Diagnosis not present

## 2019-01-23 DIAGNOSIS — R110 Nausea: Secondary | ICD-10-CM | POA: Diagnosis not present

## 2019-01-23 DIAGNOSIS — R509 Fever, unspecified: Secondary | ICD-10-CM | POA: Diagnosis not present

## 2019-01-28 DIAGNOSIS — J301 Allergic rhinitis due to pollen: Secondary | ICD-10-CM | POA: Diagnosis not present

## 2019-01-28 DIAGNOSIS — J3089 Other allergic rhinitis: Secondary | ICD-10-CM | POA: Diagnosis not present

## 2019-02-17 DIAGNOSIS — J3089 Other allergic rhinitis: Secondary | ICD-10-CM | POA: Diagnosis not present

## 2019-02-17 DIAGNOSIS — J301 Allergic rhinitis due to pollen: Secondary | ICD-10-CM | POA: Diagnosis not present

## 2019-02-19 DIAGNOSIS — H43813 Vitreous degeneration, bilateral: Secondary | ICD-10-CM | POA: Diagnosis not present

## 2019-02-19 DIAGNOSIS — H1132 Conjunctival hemorrhage, left eye: Secondary | ICD-10-CM | POA: Diagnosis not present

## 2019-02-19 DIAGNOSIS — Z961 Presence of intraocular lens: Secondary | ICD-10-CM | POA: Diagnosis not present

## 2019-02-19 DIAGNOSIS — H52203 Unspecified astigmatism, bilateral: Secondary | ICD-10-CM | POA: Diagnosis not present

## 2019-03-06 ENCOUNTER — Other Ambulatory Visit: Payer: Self-pay | Admitting: Internal Medicine

## 2019-03-31 DIAGNOSIS — J3089 Other allergic rhinitis: Secondary | ICD-10-CM | POA: Diagnosis not present

## 2019-03-31 DIAGNOSIS — J301 Allergic rhinitis due to pollen: Secondary | ICD-10-CM | POA: Diagnosis not present

## 2019-04-03 DIAGNOSIS — Z23 Encounter for immunization: Secondary | ICD-10-CM | POA: Diagnosis not present

## 2019-04-14 DIAGNOSIS — J301 Allergic rhinitis due to pollen: Secondary | ICD-10-CM | POA: Diagnosis not present

## 2019-04-14 DIAGNOSIS — J3089 Other allergic rhinitis: Secondary | ICD-10-CM | POA: Diagnosis not present

## 2019-04-17 DIAGNOSIS — J3089 Other allergic rhinitis: Secondary | ICD-10-CM | POA: Diagnosis not present

## 2019-04-17 DIAGNOSIS — J301 Allergic rhinitis due to pollen: Secondary | ICD-10-CM | POA: Diagnosis not present

## 2019-04-17 DIAGNOSIS — J4530 Mild persistent asthma, uncomplicated: Secondary | ICD-10-CM | POA: Diagnosis not present

## 2019-04-29 DIAGNOSIS — Z23 Encounter for immunization: Secondary | ICD-10-CM | POA: Diagnosis not present

## 2019-05-01 DIAGNOSIS — J3089 Other allergic rhinitis: Secondary | ICD-10-CM | POA: Diagnosis not present

## 2019-05-01 DIAGNOSIS — J301 Allergic rhinitis due to pollen: Secondary | ICD-10-CM | POA: Diagnosis not present

## 2019-05-07 DIAGNOSIS — C61 Malignant neoplasm of prostate: Secondary | ICD-10-CM | POA: Diagnosis not present

## 2019-05-12 ENCOUNTER — Ambulatory Visit: Payer: Medicare Other | Admitting: Internal Medicine

## 2019-05-16 ENCOUNTER — Telehealth (INDEPENDENT_AMBULATORY_CARE_PROVIDER_SITE_OTHER): Payer: Medicare Other | Admitting: Internal Medicine

## 2019-05-16 ENCOUNTER — Other Ambulatory Visit (HOSPITAL_COMMUNITY): Payer: Self-pay | Admitting: Urology

## 2019-05-16 ENCOUNTER — Encounter: Payer: Self-pay | Admitting: Internal Medicine

## 2019-05-16 ENCOUNTER — Encounter (HOSPITAL_COMMUNITY): Payer: Self-pay

## 2019-05-16 ENCOUNTER — Other Ambulatory Visit: Payer: Self-pay

## 2019-05-16 VITALS — BP 125/72 | HR 60 | Ht 70.0 in | Wt 217.0 lb

## 2019-05-16 DIAGNOSIS — C61 Malignant neoplasm of prostate: Secondary | ICD-10-CM | POA: Diagnosis not present

## 2019-05-16 DIAGNOSIS — I480 Paroxysmal atrial fibrillation: Secondary | ICD-10-CM

## 2019-05-16 DIAGNOSIS — J3081 Allergic rhinitis due to animal (cat) (dog) hair and dander: Secondary | ICD-10-CM | POA: Diagnosis not present

## 2019-05-16 DIAGNOSIS — J3089 Other allergic rhinitis: Secondary | ICD-10-CM | POA: Diagnosis not present

## 2019-05-16 NOTE — Progress Notes (Signed)
Electrophysiology TeleHealth Note  Due to national recommendations of social distancing due to [place] 19, an audio telehealth visit is felt to be most appropriate for this patient at this time.  Verbal consent was obtained by me for the telehealth visit today.  The patient does not have capability for a virtual visit.  A phone visit is therefore required today.   Date:  05/16/2019   ID:  Brandon Alt., DOB [birth year], MRN [id number]  Location: patient's home  Provider location:  [place]  Evaluation Performed: Follow-up visit  PCP:  Leanna Battles, MD   Electrophysiologist:  Dr Rayann Heman  Chief Complaint:  AF follow up  History of Present Illness:    Brandon Massie Silence. is a 74 y.o. male who presents via telehealth conferencing today.  Since last being seen in our clinic, the patient reports doing very well. He has not had any sustained atrial fibrillation since going on Flecainide.  He has an EKG from home that he will send through [facility] later today. Today, he denies symptoms of palpitations, chest pain, shortness of breath,  lower extremity edema, dizziness, presyncope, or syncope.  The patient is otherwise without complaint today.  The patient denies symptoms of fevers, chills, cough, or new SOB worrisome for COVID 19.  Past Medical History:  Diagnosis Date  . Aortic atherosclerosis ([place])   . Arthritis   . Asthma   . Diverticulitis   . Diverticulosis   . Ectatic thoracic aorta ([place])    a. 4.3cm by CT 03/2018.  Marland Kitchen GERD (gastroesophageal reflux disease)   . Hemorrhoids   . History of echocardiogram    a. Echo 10/16 [facility]):  EF 55%, trace MR, mild TR, mild to mod AI, mild dilated Ao root (41 mm)  . Hyperlipemia   . Organic erectile dysfunction   . PAF (paroxysmal atrial fibrillation) ([place])    a. admx to [facility] in [place], [place] 10/16 with AF with RVR >> converted to NSR with IV Dilt;  b. Flecainide started >> ETT neg for pro-arrhythmia   .  Prostate cancer ([place]) 04/25/2006   Gleason 3+4=7  . Prostatitis   . S/P cardiac cath    a. LHC at [facility] 10/16:  LM ok, mLAD 30%, LCx ok, dRCA 20%  . S/P radiation therapy 01/19/2014 through 03/05/2014                                                      Prostate bed 6600 cGy in 33 sessions                          . Torn rotator cuff    right  40% tear    Past Surgical History:  Procedure Laterality Date  . ABLATION OF DYSRHYTHMIC FOCUS  03/28/2018  . APPENDECTOMY     1967  . ATRIAL FIBRILLATION ABLATION N/A 03/28/2018   Procedure: ATRIAL FIBRILLATION ABLATION;  Surgeon: [name], MD;  Location: [place] [facility];  Service: Cardiovascular;  Laterality: N/A;  . cataract surgery Left   . COLONOSCOPY    . KNEE ARTHROSCOPY     R  . LARYNX SURGERY     vocal cord lesion- benign  . LUMBAR LAMINECTOMY/DECOMPRESSION MICRODISCECTOMY Right 07/26/2017   Procedure:  RIGHT LUMBAR 3- LUMBAR 4 FORAMINOTOMY WITH RESECTION OF SYNOVIAL CYST;  Surgeon: Erline Levine, MD;  Location: [place];  Service: Neurosurgery;  Laterality: Right;  RIGHT LUMBAR 3- LUMBAR 4 FORAMINOTOMY WITH RESECTION OF SYNOVIAL CYST  . POLYPECTOMY    . PROSTATECTOMY  04/25/2006    Current Outpatient Medications  Medication Sig Dispense Refill  . acetaminophen (TYLENOL) 500 MG tablet Take 500 mg by mouth daily.    Marland Kitchen apixaban (ELIQUIS) 5 MG TABS tablet Take 5 mg by mouth 2 (two) times daily.     Marland Kitchen atorvastatin (LIPITOR) 40 MG tablet Take 20 mg by mouth daily.    . cholecalciferol (VITAMIN D) 1000 UNITS tablet Take 1,000 Units by mouth daily.    . diclofenac sodium (VOLTAREN) 1 % GEL Using 1-2 times daily    . diltiazem (CARDIZEM CD) 180 MG 24 hr capsule TAKE 1 CAPSULE BY MOUTH EVERY DAY 90 capsule 2  . diltiazem (CARDIZEM) 30 MG tablet Take 1 tablet every 4 hours AS NEEDED for afib rapid heart rate over 100 (Patient taking differently: Take 1 tablet AS NEEDED for afib rapid heart rate over 100) 45 tablet 1  . flecainide  (TAMBOCOR) 100 MG tablet TAKE 1 TABLET BY MOUTH TWICE A DAY 180 tablet 3  . flecainide (TAMBOCOR) 50 MG tablet     . fluticasone (FLONASE) 50 MCG/ACT nasal spray Place 1 spray into both nostrils daily.     . Glucosamine-Chondroit-Vit C-Mn (GLUCOSAMINE 1500 COMPLEX PO) Glucosamine    . HYDROcodone-acetaminophen (NORCO/VICODIN) 5-325 MG tablet Take 1-2 tablets by mouth every 6 (six) hours as needed for severe pain. (Patient taking differently: Take by mouth as needed for severe pain. ) 60 tablet 0  . methocarbamol (ROBAXIN) 500 MG tablet Take 1 tablet (500 mg total) by mouth every 6 (six) hours as needed for muscle spasms. (Patient taking differently: Take 500 mg by mouth as needed for muscle spasms. Takes 1/2 tablet prn) 60 tablet 1  . Mometasone Furoate (ASMANEX HFA) 100 MCG/ACT AERO Inhale 1 puff into the lungs daily.     . montelukast (SINGULAIR) 10 MG tablet Take 10 mg by mouth at bedtime.    . Multiple Vitamin (MULTIVITAMIN) capsule Take 1 capsule by mouth daily.    Marland Kitchen omeprazole (PRILOSEC) 20 MG capsule Take 20 mg by mouth 2 (two) times daily before a meal.     . Probiotic Product (ALIGN) 4 MG CAPS Take 4 mg by mouth daily.     . psyllium (METAMUCIL) 58.6 % powder Take 1 packet by mouth 2 (two) times daily.    . VENTOLIN HFA 108 (90 Base) MCG/ACT inhaler as needed.     . vitamin C (ASCORBIC ACID) 500 MG tablet Take 500 mg by mouth daily.     No current facility-administered medications for this visit.    Allergies:   Patient has no known allergies.   Social History:  The patient  reports that he has quit smoking. His smoking use included cigarettes. He has a 10.00 pack-year smoking history. He has quit using smokeless tobacco. He reports current alcohol use of about 7.0 standard drinks of alcohol per week. He reports that he does not use drugs.   Family History:  The patient's family history includes Atrial fibrillation in his son; Diabetes in his maternal grandmother; Emphysema in his  father; Heart disease in his maternal grandfather and paternal grandfather; Hypertension in his mother; Liver cancer in his paternal grandmother and another family member; Lung cancer in his  father and another family member.   ROS:  Please see the history of present illness.   All other systems are personally reviewed and negative.    Exam:    Vital Signs:  There were no vitals taken for this visit.  Well sounding, alert and conversant   Labs/Other Tests and Data Reviewed:    Recent Labs: 08/23/2018: ALT 24; BUN 17; Creatinine, Ser 1.31; Hemoglobin 15.4; Platelets 159; Potassium 3.8; Sodium 143   Wt Readings from Last 3 Encounters:  12/31/18 222 lb 9.6 oz (101 kg)  10/28/18 215 lb (97.5 kg)  09/19/18 215 lb (97.5 kg)      ASSESSMENT & PLAN:    1.  Paroxysmal atrial fibrillation Doing well on Flecainide Continue Eliquis for CHADS2VASC of 3 - no bleeding issues on Eliquis   Follow-up:  With me in 6 months    Patient Risk:  after full review of this patients clinical status, I feel that they are at moderate risk at this time.  Today, I have spent 15 minutes with the patient with telehealth technology discussing arrhythmia management .    SignedThompson Grayer, MD  05/16/2019 1:44 PM     [address] [place] [address] [phone] (office) [phone] (fax)

## 2019-05-19 DIAGNOSIS — J301 Allergic rhinitis due to pollen: Secondary | ICD-10-CM | POA: Diagnosis not present

## 2019-05-19 DIAGNOSIS — J3089 Other allergic rhinitis: Secondary | ICD-10-CM | POA: Diagnosis not present

## 2019-05-21 DIAGNOSIS — J3089 Other allergic rhinitis: Secondary | ICD-10-CM | POA: Diagnosis not present

## 2019-05-21 DIAGNOSIS — J301 Allergic rhinitis due to pollen: Secondary | ICD-10-CM | POA: Diagnosis not present

## 2019-05-26 DIAGNOSIS — J301 Allergic rhinitis due to pollen: Secondary | ICD-10-CM | POA: Diagnosis not present

## 2019-05-26 DIAGNOSIS — J3089 Other allergic rhinitis: Secondary | ICD-10-CM | POA: Diagnosis not present

## 2019-05-27 ENCOUNTER — Other Ambulatory Visit: Payer: Self-pay

## 2019-05-27 ENCOUNTER — Ambulatory Visit (HOSPITAL_COMMUNITY)
Admission: RE | Admit: 2019-05-27 | Discharge: 2019-05-27 | Disposition: A | Discharge status: Home / Self Care | Payer: Medicare Other | Source: Ambulatory Visit | Attending: Urology | Admitting: Urology

## 2019-05-27 DIAGNOSIS — C61 Malignant neoplasm of prostate: Secondary | ICD-10-CM

## 2019-05-27 MED ORDER — AXUMIN (FLUCICLOVINE F 18) INJECTION
10.1400 | Freq: Once | INTRAVENOUS | Status: AC | PRN
  Administered 2019-05-27: 10.14 via INTRAVENOUS

## 2019-05-28 DIAGNOSIS — E7849 Other hyperlipidemia: Secondary | ICD-10-CM | POA: Diagnosis not present

## 2019-05-28 DIAGNOSIS — Z125 Encounter for screening for malignant neoplasm of prostate: Secondary | ICD-10-CM | POA: Diagnosis not present

## 2019-05-30 DIAGNOSIS — J301 Allergic rhinitis due to pollen: Secondary | ICD-10-CM | POA: Diagnosis not present

## 2019-05-30 DIAGNOSIS — J3089 Other allergic rhinitis: Secondary | ICD-10-CM | POA: Diagnosis not present

## 2019-06-02 DIAGNOSIS — J301 Allergic rhinitis due to pollen: Secondary | ICD-10-CM | POA: Diagnosis not present

## 2019-06-02 DIAGNOSIS — J3089 Other allergic rhinitis: Secondary | ICD-10-CM | POA: Diagnosis not present

## 2019-06-03 DIAGNOSIS — Z1339 Encounter for screening examination for other mental health and behavioral disorders: Secondary | ICD-10-CM | POA: Diagnosis not present

## 2019-06-03 DIAGNOSIS — Z7901 Long term (current) use of anticoagulants: Secondary | ICD-10-CM | POA: Diagnosis not present

## 2019-06-03 DIAGNOSIS — E669 Obesity, unspecified: Secondary | ICD-10-CM | POA: Diagnosis not present

## 2019-06-03 DIAGNOSIS — E785 Hyperlipidemia, unspecified: Secondary | ICD-10-CM | POA: Diagnosis not present

## 2019-06-03 DIAGNOSIS — R5383 Other fatigue: Secondary | ICD-10-CM | POA: Diagnosis not present

## 2019-06-03 DIAGNOSIS — Z0000 Encounter for general adult medical examination without abnormal findings: Secondary | ICD-10-CM | POA: Diagnosis not present

## 2019-06-03 DIAGNOSIS — I2510 Atherosclerotic heart disease of native coronary artery without angina pectoris: Secondary | ICD-10-CM | POA: Diagnosis not present

## 2019-06-03 DIAGNOSIS — C61 Malignant neoplasm of prostate: Secondary | ICD-10-CM | POA: Diagnosis not present

## 2019-06-03 DIAGNOSIS — Z1331 Encounter for screening for depression: Secondary | ICD-10-CM | POA: Diagnosis not present

## 2019-06-03 DIAGNOSIS — M545 Low back pain: Secondary | ICD-10-CM | POA: Diagnosis not present

## 2019-06-03 DIAGNOSIS — R82998 Other abnormal findings in urine: Secondary | ICD-10-CM | POA: Diagnosis not present

## 2019-06-03 DIAGNOSIS — I4891 Unspecified atrial fibrillation: Secondary | ICD-10-CM | POA: Diagnosis not present

## 2019-06-03 DIAGNOSIS — J45909 Unspecified asthma, uncomplicated: Secondary | ICD-10-CM | POA: Diagnosis not present

## 2019-06-03 DIAGNOSIS — M21371 Foot drop, right foot: Secondary | ICD-10-CM | POA: Diagnosis not present

## 2019-06-09 ENCOUNTER — Telehealth: Payer: Medicare Other | Admitting: Internal Medicine

## 2019-06-13 DIAGNOSIS — Z1212 Encounter for screening for malignant neoplasm of rectum: Secondary | ICD-10-CM | POA: Diagnosis not present

## 2019-06-17 DIAGNOSIS — J301 Allergic rhinitis due to pollen: Secondary | ICD-10-CM | POA: Diagnosis not present

## 2019-06-17 DIAGNOSIS — J3089 Other allergic rhinitis: Secondary | ICD-10-CM | POA: Diagnosis not present

## 2019-06-25 DIAGNOSIS — R0683 Snoring: Secondary | ICD-10-CM | POA: Diagnosis not present

## 2019-06-26 DIAGNOSIS — R0683 Snoring: Secondary | ICD-10-CM | POA: Diagnosis not present

## 2019-06-30 DIAGNOSIS — J3089 Other allergic rhinitis: Secondary | ICD-10-CM | POA: Diagnosis not present

## 2019-06-30 DIAGNOSIS — J301 Allergic rhinitis due to pollen: Secondary | ICD-10-CM | POA: Diagnosis not present

## 2019-07-14 DIAGNOSIS — J301 Allergic rhinitis due to pollen: Secondary | ICD-10-CM | POA: Diagnosis not present

## 2019-07-14 DIAGNOSIS — J3089 Other allergic rhinitis: Secondary | ICD-10-CM | POA: Diagnosis not present

## 2019-07-29 DIAGNOSIS — J301 Allergic rhinitis due to pollen: Secondary | ICD-10-CM | POA: Diagnosis not present

## 2019-07-29 DIAGNOSIS — J3089 Other allergic rhinitis: Secondary | ICD-10-CM | POA: Diagnosis not present

## 2019-07-30 ENCOUNTER — Other Ambulatory Visit (HOSPITAL_COMMUNITY): Payer: Self-pay | Admitting: Nurse Practitioner

## 2019-08-13 DIAGNOSIS — J301 Allergic rhinitis due to pollen: Secondary | ICD-10-CM | POA: Diagnosis not present

## 2019-08-13 DIAGNOSIS — J3089 Other allergic rhinitis: Secondary | ICD-10-CM | POA: Diagnosis not present

## 2019-08-13 DIAGNOSIS — C61 Malignant neoplasm of prostate: Secondary | ICD-10-CM | POA: Diagnosis not present

## 2019-08-19 DIAGNOSIS — S90572A Other superficial bite of ankle, left ankle, initial encounter: Secondary | ICD-10-CM | POA: Diagnosis not present

## 2019-09-17 ENCOUNTER — Encounter (HOSPITAL_COMMUNITY): Payer: Self-pay | Admitting: Nurse Practitioner

## 2019-09-17 ENCOUNTER — Ambulatory Visit (HOSPITAL_COMMUNITY)
Admission: RE | Admit: 2019-09-17 | Discharge: 2019-09-17 | Disposition: A | Discharge status: Home / Self Care | Payer: Medicare Other | Source: Ambulatory Visit | Attending: Nurse Practitioner | Admitting: Nurse Practitioner

## 2019-09-17 ENCOUNTER — Other Ambulatory Visit: Payer: Self-pay

## 2019-09-17 VITALS — BP 128/66 | HR 107 | Ht 70.0 in | Wt 209.4 lb

## 2019-09-17 DIAGNOSIS — Z9079 Acquired absence of other genital organ(s): Secondary | ICD-10-CM | POA: Diagnosis not present

## 2019-09-17 DIAGNOSIS — E785 Hyperlipidemia, unspecified: Secondary | ICD-10-CM | POA: Diagnosis not present

## 2019-09-17 DIAGNOSIS — I480 Paroxysmal atrial fibrillation: Secondary | ICD-10-CM | POA: Diagnosis present

## 2019-09-17 DIAGNOSIS — Z7901 Long term (current) use of anticoagulants: Secondary | ICD-10-CM | POA: Insufficient documentation

## 2019-09-17 DIAGNOSIS — Z8619 Personal history of other infectious and parasitic diseases: Secondary | ICD-10-CM | POA: Diagnosis not present

## 2019-09-17 DIAGNOSIS — K219 Gastro-esophageal reflux disease without esophagitis: Secondary | ICD-10-CM | POA: Diagnosis not present

## 2019-09-17 DIAGNOSIS — Z923 Personal history of irradiation: Secondary | ICD-10-CM | POA: Diagnosis not present

## 2019-09-17 DIAGNOSIS — M1990 Unspecified osteoarthritis, unspecified site: Secondary | ICD-10-CM | POA: Diagnosis not present

## 2019-09-17 DIAGNOSIS — I700 Atherosclerosis of aorta: Secondary | ICD-10-CM | POA: Insufficient documentation

## 2019-09-17 DIAGNOSIS — Z8249 Family history of ischemic heart disease and other diseases of the circulatory system: Secondary | ICD-10-CM | POA: Diagnosis not present

## 2019-09-17 DIAGNOSIS — D6869 Other thrombophilia: Secondary | ICD-10-CM | POA: Diagnosis not present

## 2019-09-17 DIAGNOSIS — J45909 Unspecified asthma, uncomplicated: Secondary | ICD-10-CM | POA: Diagnosis not present

## 2019-09-17 DIAGNOSIS — Z79899 Other long term (current) drug therapy: Secondary | ICD-10-CM | POA: Insufficient documentation

## 2019-09-17 DIAGNOSIS — K5790 Diverticulosis of intestine, part unspecified, without perforation or abscess without bleeding: Secondary | ICD-10-CM | POA: Diagnosis not present

## 2019-09-17 DIAGNOSIS — Z8546 Personal history of malignant neoplasm of prostate: Secondary | ICD-10-CM | POA: Diagnosis not present

## 2019-09-17 DIAGNOSIS — Z87891 Personal history of nicotine dependence: Secondary | ICD-10-CM | POA: Insufficient documentation

## 2019-09-17 NOTE — Progress Notes (Addendum)
Primary Care Physician: Leanna Battles, MD Referring Physician:Dr. Geroge Baseman Brandon Simmons. is a 74 y.o. male with a h/o paroxysmal afib that is in the afib clinic for onset afib last pm.  He  had ablation in 03/2018. He had to go back on daily flecainide and Cardizem as he had some stuttering afib after trying to stop antiarrythmic at 3 month f/u.  He has enjoyed sinus rhythm since being back on daily meds until last night. He also had Rocky Spotted Tick fever within the last few weeks and was treated with doxycycline. He had a tooth pulled 2/2 to infection last week and just finished amoxicillin. He stopped anticoagulation for 1 day  then resumed Friday. He is also having new onset of pain in his rt leg that his PCP does not think is related to tick bite but possibly 2/2 back issues. He tried an extra 30 mg Cardizem last night but felt like it bottomed out his BP.   Today, he denies symptoms of palpitations, chest pain, shortness of breath, orthopnea, PND, lower extremity edema, dizziness, presyncope, syncope, or neurologic sequela. The patient is tolerating medications without difficulties and is otherwise without complaint today.   Past Medical History:  Diagnosis Date  . Aortic atherosclerosis ([place])   . Arthritis   . Asthma   . Diverticulitis   . Diverticulosis   . Ectatic thoracic aorta ([place])    a. 4.3cm by CT 03/2018.  Marland Kitchen GERD (gastroesophageal reflux disease)   . Hemorrhoids   . History of echocardiogram    a. Echo 10/16 [facility]):  EF 55%, trace MR, mild TR, mild to mod AI, mild dilated Ao root (41 mm)  . Hyperlipemia   . Organic erectile dysfunction   . PAF (paroxysmal atrial fibrillation) ([place])    a. admx to [facility] in [place], [place] 10/16 with AF with RVR >> converted to NSR with IV Dilt;  b. Flecainide started >> ETT neg for pro-arrhythmia   . Prostate cancer ([place]) 04/25/2006   Gleason 3+4=7  . Prostatitis   . S/P cardiac cath    a. LHC at [facility] 10/16:  LM ok, mLAD 30%, LCx ok, dRCA 20%  . S/P radiation therapy 01/19/2014 through 03/05/2014                                                      Prostate bed 6600 cGy in 33 sessions                          . Torn rotator cuff    right  40% tear   Past Surgical History:  Procedure Laterality Date  . ABLATION OF DYSRHYTHMIC FOCUS  03/28/2018  . APPENDECTOMY     1967  . ATRIAL FIBRILLATION ABLATION N/A 03/28/2018   Procedure: ATRIAL FIBRILLATION ABLATION;  Surgeon: [name], MD;  Location: [facility] [facility];  Service: Cardiovascular;  Laterality: N/A;  . cataract surgery Left   . COLONOSCOPY    . KNEE ARTHROSCOPY     R  . LARYNX SURGERY     vocal cord lesion- benign  . LUMBAR LAMINECTOMY/DECOMPRESSION MICRODISCECTOMY Right 07/26/2017   Procedure: RIGHT LUMBAR 3- LUMBAR 4 FORAMINOTOMY WITH RESECTION OF SYNOVIAL CYST;  Surgeon: Erline Levine, MD;  Location: [facility];  Service: Neurosurgery;  Laterality: Right;  RIGHT LUMBAR 3- LUMBAR 4 FORAMINOTOMY WITH RESECTION OF SYNOVIAL CYST  . POLYPECTOMY    . PROSTATECTOMY  04/25/2006    Current Outpatient Medications  Medication Sig Dispense Refill  . acetaminophen (TYLENOL) 500 MG tablet Take 500 mg by mouth daily.    Marland Kitchen apixaban (ELIQUIS) 5 MG TABS tablet Take 5 mg by mouth 2 (two) times daily.     Marland Kitchen atorvastatin (LIPITOR) 40 MG tablet Take 20 mg by mouth daily.    . cholecalciferol (VITAMIN D) 1000 UNITS tablet Take 1,000 Units by mouth daily.    . diclofenac sodium (VOLTAREN) 1 % GEL Using 1-2 times daily    . diltiazem (CARDIZEM CD) 180 MG 24 hr capsule TAKE 1 CAPSULE BY MOUTH EVERY DAY 90 capsule 2  . diltiazem (CARDIZEM) 30 MG tablet Take 1 tablet every 4 hours AS NEEDED for afib rapid heart rate over 100 (Patient taking differently: Take 1 tablet AS NEEDED for afib rapid heart rate over 100) 45 tablet 1  . flecainide (TAMBOCOR) 100 MG tablet TAKE 1 TABLET BY MOUTH TWICE A DAY 180 tablet 2  . flecainide (TAMBOCOR) 50 MG tablet      . fluticasone (FLONASE) 50 MCG/ACT nasal spray Place 1 spray into both nostrils daily.     . Glucosamine-[place]-Vit C-Mn (GLUCOSAMINE 1500 COMPLEX PO) Glucosamine    . HYDROcodone-acetaminophen (NORCO/VICODIN) 5-325 MG tablet Take 1-2 tablets by mouth every 6 (six) hours as needed for severe pain. (Patient taking differently: Take by mouth as needed for severe pain. ) 60 tablet 0  . methocarbamol (ROBAXIN) 500 MG tablet Take 1 tablet (500 mg total) by mouth every 6 (six) hours as needed for muscle spasms. (Patient taking differently: Take 500 mg by mouth as needed for muscle spasms. Takes 1/2 tablet prn) 60 tablet 1  . Mometasone Furoate (ASMANEX HFA) 100 MCG/ACT AERO Inhale 1 puff into the lungs daily.     . montelukast (SINGULAIR) 10 MG tablet Take 10 mg by mouth at bedtime.    . Multiple Vitamin (MULTIVITAMIN) capsule Take 1 capsule by mouth daily.    Marland Kitchen omeprazole (PRILOSEC) 20 MG capsule Take 20 mg by mouth 2 (two) times daily before a meal.     . Probiotic Product (ALIGN) 4 MG CAPS Take 4 mg by mouth daily.     . psyllium (METAMUCIL) 58.6 % powder Take 1 packet by mouth 2 (two) times daily.    . VENTOLIN HFA 108 (90 Base) MCG/ACT inhaler as needed.     . vitamin C (ASCORBIC ACID) 500 MG tablet Take 500 mg by mouth daily.     No current facility-administered medications for this encounter.    No Known Allergies  Social History   Socioeconomic History  . Marital status: Married    Spouse name: Not on file  . Number of children: 2  . Years of education: Not on file  . Highest education level: Not on file  Occupational History  . 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 Occupation: Optometrist  Tobacco Use  . Smoking status: Former Smoker    Packs/day: 1.00    Years: 10.00    Pack years: 10.00    Types: Cigarettes  . Smokeless tobacco: Former Systems developer  . Tobacco comment: 1972  Vaping Use  . Vaping Use: Never used  Substance and Sexual Activity  . Alcohol use: Yes    Alcohol/week: 7.0  standard drinks    Types: 7 Glasses  of wine per week    Comment: 1 glass wine or beer daily  . Drug use: No  . Sexual activity: Not on file  Other Topics Concern  . Not on file  Social History Narrative   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  Consultant for schools.   Travels over seas    Social Determinants of Health   Financial Resource Strain:   . Difficulty of Paying Living Expenses:   Food Insecurity:   . Worried About Charity fundraiser in the Last Year:   . Arboriculturist in the Last Year:   Transportation Needs:   . Film/video editor (Medical):   Marland Kitchen Lack of Transportation (Non-Medical):   Physical Activity:   . Days of Exercise per Week:   . Minutes of Exercise per Session:   Stress:   . Feeling of Stress :   Social Connections:   . Frequency of Communication with Friends and Family:   . Frequency of Social Gatherings with Friends and Family:   . Attends Religious Services:   . Active Member of Clubs or Organizations:   . Attends Archivist Meetings:   Marland Kitchen Marital Status:   Intimate Partner Violence:   . Fear of Current or Ex-Partner:   . Emotionally Abused:   Marland Kitchen Physically Abused:   . Sexually Abused:     Family History  Problem Relation Age of Onset  . Emphysema Father   . Lung cancer Father   . Hypertension Mother   . Diabetes Maternal Grandmother   . Heart disease Maternal Grandfather        Died age 76  . Liver cancer Paternal Grandmother   . Heart disease Paternal Grandfather        Died age 1  . Liver cancer Other   . Lung cancer Other   . Atrial fibrillation Son     ROS- All systems are reviewed and negative except as per the HPI above  Physical Exam: There were no vitals filed for this visit. Wt Readings from Last 3 Encounters:  05/16/19 98.4 kg  12/31/18 101 kg  10/28/18 97.5 kg    Labs: Lab Results  Component Value Date   NA 143 08/23/2018   K 3.8 08/23/2018   CL 110 08/23/2018   CO2 22 08/23/2018   GLUCOSE 122 (H)  08/23/2018   BUN 17 08/23/2018   CREATININE 1.31 (H) 08/23/2018   CALCIUM 9.6 08/23/2018   MG 2.2 02/11/2018   Lab Results  Component Value Date   INR 1.04 02/11/2018   No results found for: CHOL, HDL, LDLCALC, TRIG   GEN- The patient is well appearing, alert and oriented x 3 today.   Head- normocephalic, atraumatic Eyes-  Sclera clear, conjunctiva pink Ears- hearing intact Oropharynx- clear Neck- supple, no JVP Lymph- no cervical lymphadenopathy Lungs- Clear to ausculation bilaterally, normal work of breathing Heart- irregular rate and rhythm, no murmurs, rubs or gallops, PMI not laterally displaced GI- soft, NT, ND, + BS Extremities- no clubbing, cyanosis, or edema MS- no significant deformity or atrophy Skin- no rash or lesion Psych- euthymic mood, full affect Neuro- strength and sensation are intact  EKG-afib at 107 bpm, qrs int 100 ms, qtc 384 ms, with marked ST changes possible inferior subendocardial injury( these changes not present on last ekg in rhythm, arrhythmia can often cause st changes.   Will repeat when pt returns to  NSR, denies any chest discomfort ) Epic records reviewed  Assessment and Plan: 1. Paroxysmal afib S/p ablation 03/2018 Doing well staying in SR back on AAD/CCB, until last PM He has had several health issues in the last couple of weeks that could have been the trigger for afib Continue flecainide 100 mg bid He will take an extra 100 mg flecainide today at lunch time  Continue cardizem at 180 mg daily which he takes in the pm If still in afib in the morning, call the office for further directions, may have him try just 15 mg cardizem every 4 hours to control heart rate but may not bottom out his BP  2. Chadsvasc score of 3  Continue  eliquis 5 mg bid, if requires cardioversion, he will need TEE guided cardioversion as he missed a day of anticoagulation last week    f/u per phone call in am   Brandon Simmons, [facility] [address] [place], [place] [address] [phone]

## 2019-09-17 NOTE — Patient Instructions (Signed)
Take extra flecainide at lunch today. Normal dose of 180mg  of cardizem tonight if still out tomorrow just use cardizem 1/2 tablet every 4 hours as needed -- call with update.

## 2019-09-17 NOTE — Addendum Note (Signed)
Encounter addended by: Sherran Needs, NP on: 09/17/2019 10:01 AM  Actions taken: Medication List reviewed, Problem List reviewed, Allergies reviewed, Clinical Note Signed

## 2019-09-18 ENCOUNTER — Ambulatory Visit (HOSPITAL_COMMUNITY)
Admission: RE | Admit: 2019-09-18 | Discharge: 2019-09-18 | Disposition: A | Discharge status: Home / Self Care | Payer: Medicare Other | Source: Ambulatory Visit | Attending: Nurse Practitioner | Admitting: Nurse Practitioner

## 2019-09-18 VITALS — BP 134/68 | HR 59

## 2019-09-18 DIAGNOSIS — I4891 Unspecified atrial fibrillation: Secondary | ICD-10-CM | POA: Diagnosis present

## 2019-09-18 DIAGNOSIS — I480 Paroxysmal atrial fibrillation: Secondary | ICD-10-CM

## 2019-09-18 NOTE — Progress Notes (Signed)
Pt in for EKG after being in afib yesterday with  extra 100 mg flecainide given  helped to get him back in SR around 7 pm last night. EKG in afib showed st/t wave changes that had not been noticed on prior EKG's in SR. Today's EKG in SR is normal without any st changes . F/u in afib clinic as needed.

## 2019-09-29 ENCOUNTER — Encounter: Payer: Self-pay | Admitting: Family Medicine

## 2019-09-29 ENCOUNTER — Ambulatory Visit (INDEPENDENT_AMBULATORY_CARE_PROVIDER_SITE_OTHER): Payer: Medicare Other | Admitting: Family Medicine

## 2019-09-29 ENCOUNTER — Other Ambulatory Visit: Payer: Self-pay

## 2019-09-29 DIAGNOSIS — M25552 Pain in left hip: Secondary | ICD-10-CM | POA: Diagnosis not present

## 2019-09-29 NOTE — Progress Notes (Signed)
Office Visit Note   Patient: Brandon Simmons.           Date of Birth: 24-Jan-1946           MRN: [id number] Visit Date: 09/29/2019 Requested by: Leanna Battles, MD [place],  [place] [address] PCP: Leanna Battles, MD  Subjective: Chief Complaint  Patient presents with  . Left Hip - Pain    Pain lateral hip, mainly with walking x 3-4 months. It radiates down the thigh, and occasionally to the foot. H/o dislocation of the hip less than 10 yrs ago. Has PT scheduled at [facility].    HPI: He is here with left hip pain.  Symptoms started 3 or 4 months ago, no injury.  Pain on the lateral aspect primarily with walking.  Occasionally radiates to his foot.  He has a history of dislocation of his hip after falling 10 years ago.  It relocated spontaneously.  Follow-up x-rays were negative.  He has had intermittent troubles with his hip since then.  Denies any current low back pain.  He had a history of lumbar cyst removal a couple years ago with good results.  He was having right-sided sciatica at that time.  He had x-rays of his spine recently per his PCP.  I do not have those available for review.               ROS: No bowel or bladder dysfunction.  All other systems were reviewed and are negative.  Objective: Vital Signs: There were no vitals taken for this visit.  Physical Exam:  General:  Alert and oriented, in no acute distress. Pulm:  Breathing unlabored. Psy:  Normal mood, congruent affect. Skin: No rash Low back: No significant spine tenderness.  No pain in the sciatic notch.  He is point tender over the greater trochanter of the left hip.  Good range of motion and no significant pain with passive internal rotation.  Lower extremity strength and reflexes are normal.  Imaging: No results found.  Assessment & Plan: 1.  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  Cannot rule out lumbar foraminal stenosis. -Discussed options with him and he wants to  try an injection today.  We will also have him go to [facility] PT. -Lumbar MRI scan if pain persist.     Procedures: Left hip greater trochanter injection: After sterile prep with Betadine, injected 5 cc 1% lidocaine without epinephrine and 40 mg methylprednisolone into the area of maximum tenderness.    PMFS History: Patient Active Problem List   Diagnosis Date Noted  . Atrial fibrillation ([place]) 08/24/2018  . Mild renal insufficiency 08/24/2018  . Mild aortic regurgitation 08/24/2018  . 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 Paroxysmal atrial fibrillation ([place]) 03/28/2018  . Synovial cyst of lumbar facet joint 07/26/2017  . PAF (paroxysmal atrial fibrillation) ([place]) 02/26/2015  . Malignant neoplasm of prostate ([place]) 10/16/2013   Past Medical History:  Diagnosis Date  . Aortic atherosclerosis ([place])   . Arthritis   . Asthma   . Diverticulitis   . Diverticulosis   . Ectatic thoracic aorta ([place])    a. 4.3cm by CT 03/2018.  Marland Kitchen GERD (gastroesophageal reflux disease)   . Hemorrhoids   . History of echocardiogram    a. Echo 10/16 [facility]):  EF 55%, trace MR, mild TR, mild to mod AI, mild dilated Ao root (41 mm)  . Hyperlipemia   . Organic erectile dysfunction   .  PAF (paroxysmal atrial fibrillation) ([place])    a. admx to [facility] in [place], [place] 10/16 with AF with RVR >> converted to NSR with IV Dilt;  b. Flecainide started >> ETT neg for pro-arrhythmia   . Prostate cancer ([place]) 04/25/2006   Gleason 3+4=7  . Prostatitis   . S/P cardiac cath    a. LHC at [facility] 10/16:  LM ok, mLAD 30%, LCx ok, dRCA 20%  . S/P radiation therapy 01/19/2014 through 03/05/2014                                                      Prostate bed 6600 cGy in 33 sessions                          . Torn rotator cuff    right  40% tear    Family History  Problem Relation Age of Onset  . Emphysema Father   . Lung cancer Father   . Hypertension Mother   . Diabetes  Maternal Grandmother   . Heart disease Maternal Grandfather        Died age 9  . Liver cancer Paternal Grandmother   . Heart disease Paternal Grandfather        Died age 91  . Liver cancer Other   . Lung cancer Other   . Atrial fibrillation Son     Past Surgical History:  Procedure Laterality Date  . ABLATION OF DYSRHYTHMIC FOCUS  03/28/2018  . APPENDECTOMY     1967  . ATRIAL FIBRILLATION ABLATION N/A 03/28/2018   Procedure: ATRIAL FIBRILLATION ABLATION;  Surgeon: [name], MD;  Location: [facility];  Service: Cardiovascular;  Laterality: N/A;  . cataract surgery Left   . COLONOSCOPY    . KNEE ARTHROSCOPY     R  . LARYNX SURGERY     vocal cord lesion- benign  . LUMBAR LAMINECTOMY/DECOMPRESSION MICRODISCECTOMY Right 07/26/2017   Procedure: RIGHT LUMBAR 3- LUMBAR 4 FORAMINOTOMY WITH RESECTION OF SYNOVIAL CYST;  Surgeon: Erline Levine, MD;  Location: [facility];  Service: Neurosurgery;  Laterality: Right;  RIGHT LUMBAR 3- LUMBAR 4 FORAMINOTOMY WITH RESECTION OF SYNOVIAL CYST  . POLYPECTOMY    . PROSTATECTOMY  04/25/2006   Social History   Occupational History  . 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 Occupation: Optometrist  Tobacco Use  . Smoking status: Former Smoker    Packs/day: 1.00    Years: 10.00    Pack years: 10.00    Types: Cigarettes  . Smokeless tobacco: Former Systems developer  . Tobacco comment: 1972  Vaping Use  . Vaping Use: Never used  Substance and Sexual Activity  . Alcohol use: Yes    Alcohol/week: 7.0 standard drinks    Types: 7 Glasses of wine per week    Comment: 1 glass wine or beer daily  . Drug use: No  . Sexual activity: Not on file

## 2019-09-30 DIAGNOSIS — M7062 Trochanteric bursitis, left hip: Secondary | ICD-10-CM | POA: Diagnosis not present

## 2019-10-01 DIAGNOSIS — J301 Allergic rhinitis due to pollen: Secondary | ICD-10-CM | POA: Diagnosis not present

## 2019-10-01 DIAGNOSIS — J3089 Other allergic rhinitis: Secondary | ICD-10-CM | POA: Diagnosis not present

## 2019-10-02 ENCOUNTER — Telehealth (HOSPITAL_COMMUNITY): Payer: Self-pay | Admitting: *Deleted

## 2019-10-02 NOTE — Telephone Encounter (Signed)
Pt called in stating he went back in AF yesterday at 6pm HRs initially in the 120s. Took PRN cardizem his HR this morning is in the upper 80s. Per Roderic Palau NP take extra 100mg  of flecainide at lunch if still out of rhythm. Use PRN cardizem 30mg  as needed for elevated HRs.  . Pt does think this episode was triggered by cortisone injection in hip for pain. Will also request appt for Dr Rayann Heman to discuss uptick in AF burden. Pt in agreement.

## 2019-10-09 ENCOUNTER — Telehealth (HOSPITAL_COMMUNITY): Payer: Self-pay

## 2019-10-09 NOTE — Telephone Encounter (Signed)
Pt called in to report that he is still in Afib. Pt states his BP is 124/82 and HR is 126. Pt states he took 100 mg of Flecainide in the AM. He took an additional 100 mg of Flecainide at 2:15 pm. He also took Cardizem 30 mg at 11:45 am.   Per Roderic Palau, NP patient is ok to take his evening dose of Flecainide 100 mg and Cardizem 180 mg and call tomorrow if symptoms worsen. Pt is aware and agreeable to plan.

## 2019-10-10 DIAGNOSIS — M7062 Trochanteric bursitis, left hip: Secondary | ICD-10-CM | POA: Diagnosis not present

## 2019-10-21 ENCOUNTER — Telehealth: Payer: Self-pay

## 2019-10-21 NOTE — Telephone Encounter (Signed)
Left a message regarding virtual appt on 10/22/19.

## 2019-10-22 ENCOUNTER — Encounter: Payer: Self-pay | Admitting: *Deleted

## 2019-10-22 ENCOUNTER — Telehealth (INDEPENDENT_AMBULATORY_CARE_PROVIDER_SITE_OTHER): Payer: Medicare Other | Admitting: Internal Medicine

## 2019-10-22 ENCOUNTER — Other Ambulatory Visit: Payer: Self-pay

## 2019-10-22 ENCOUNTER — Encounter: Payer: Self-pay | Admitting: Internal Medicine

## 2019-10-22 ENCOUNTER — Telehealth: Payer: Self-pay | Admitting: *Deleted

## 2019-10-22 VITALS — BP 146/78 | HR 53 | Ht 70.0 in | Wt 205.0 lb

## 2019-10-22 DIAGNOSIS — D6869 Other thrombophilia: Secondary | ICD-10-CM

## 2019-10-22 DIAGNOSIS — I480 Paroxysmal atrial fibrillation: Secondary | ICD-10-CM

## 2019-10-22 DIAGNOSIS — I4891 Unspecified atrial fibrillation: Secondary | ICD-10-CM

## 2019-10-22 MED ORDER — METOPROLOL TARTRATE 50 MG PO TABS: 50.0000 mg | ORAL_TABLET | ORAL | 0 refills | Status: DC

## 2019-10-22 NOTE — Addendum Note (Signed)
Addended by: Darrell Jewel on: 10/22/2019 03:05 PM   Modules accepted: Orders

## 2019-10-22 NOTE — H&P (View-Only) (Signed)
Electrophysiology TeleHealth Note   Due to national recommendations of social distancing due to COVID 19, an audio/video telehealth visit is felt to be most appropriate for this patient at this time.  See MyChart message from today for the patient's consent to telehealth for [facility].  Date:  10/22/2019   ID:  Brandon Alt., DOB Dec 08, 1945, MRN [id number]  Location: patient's home  Provider location:  [facility]  Evaluation Performed: Follow-up visit  PCP:  Leanna Battles, MD   Electrophysiologist:  Dr Rayann Heman  Chief Complaint:  palpitations  History of Present Illness:    Brandon Simmons. is a 74 y.o. male who presents via telehealth conferencing today.  Since last being seen in our clinic, the patient reports doing very well.  His has recenlty had increasing frequency and duration of his afib.  + palpitations and fatigue.   Today, he denies symptoms of chest pain, shortness of breath,  lower extremity edema, dizziness, presyncope, or syncope.  The patient is otherwise without complaint today.   Past Medical History:  Diagnosis Date  . Aortic atherosclerosis ([place])   . Arthritis   . Asthma   . Diverticulitis   . Diverticulosis   . Ectatic thoracic aorta ([place])    a. 4.3cm by CT 03/2018.  Marland Kitchen GERD (gastroesophageal reflux disease)   . Hemorrhoids   . History of echocardiogram    a. Echo 10/16 [facility]):  EF 55%, trace MR, mild TR, mild to mod AI, mild dilated Ao root (41 mm)  . Hyperlipemia   . Organic erectile dysfunction   . PAF (paroxysmal atrial fibrillation) ([place])    a. admx to [facility] in [place], [place] 10/16 with AF with RVR >> converted to NSR with IV Dilt;  b. Flecainide started >> ETT neg for pro-arrhythmia   . Prostate cancer ([place]) 04/25/2006   Gleason 3+4=7  . Prostatitis   . S/P cardiac cath    a. LHC at [facility] 10/16:  LM ok, mLAD 30%, LCx ok, dRCA 20%  . S/P radiation therapy 01/19/2014 through 03/05/2014                                                       Prostate bed 6600 cGy in 33 sessions                          . Torn rotator cuff    right  40% tear    Past Surgical History:  Procedure Laterality Date  . ABLATION OF DYSRHYTHMIC FOCUS  03/28/2018  . APPENDECTOMY     1967  . ATRIAL FIBRILLATION ABLATION N/A 03/28/2018   Procedure: ATRIAL FIBRILLATION ABLATION;  Surgeon: [name], MD;  Location: [place] [facility];  Service: Cardiovascular;  Laterality: N/A;  . cataract surgery Left   . COLONOSCOPY    . KNEE ARTHROSCOPY     R  . LARYNX SURGERY     vocal cord lesion- benign  . LUMBAR LAMINECTOMY/DECOMPRESSION MICRODISCECTOMY Right 07/26/2017   Procedure: RIGHT LUMBAR 3- LUMBAR 4 FORAMINOTOMY WITH RESECTION OF SYNOVIAL CYST;  Surgeon: Erline Levine, MD;  Location: [place];  Service: Neurosurgery;  Laterality: Right;  RIGHT LUMBAR 3- LUMBAR 4 FORAMINOTOMY WITH RESECTION OF SYNOVIAL CYST  . POLYPECTOMY    .  PROSTATECTOMY  04/25/2006    Current Outpatient Medications  Medication Sig Dispense Refill  . acetaminophen (TYLENOL) 500 MG tablet Take 500 mg by mouth daily.    Marland Kitchen apixaban (ELIQUIS) 5 MG TABS tablet Take 5 mg by mouth 2 (two) times daily.     Marland Kitchen atorvastatin (LIPITOR) 40 MG tablet Take 20 mg by mouth daily.    . cholecalciferol (VITAMIN D) 1000 UNITS tablet Take 1,000 Units by mouth daily.    . diclofenac sodium (VOLTAREN) 1 % GEL Using 1-2 times daily    . diltiazem (CARDIZEM CD) 180 MG 24 hr capsule TAKE 1 CAPSULE BY MOUTH EVERY DAY 90 capsule 2  . diltiazem (CARDIZEM) 30 MG tablet Take 1 tablet every 4 hours AS NEEDED for afib rapid heart rate over 100 (Patient taking differently: Take 1 tablet AS NEEDED for afib rapid heart rate over 100) 45 tablet 1  . EPINEPHrine 0.3 mg/0.3 mL IJ SOAJ injection See admin instructions.    . flecainide (TAMBOCOR) 100 MG tablet TAKE 1 TABLET BY MOUTH TWICE A DAY 180 tablet 2  . fluticasone (FLONASE) 50 MCG/ACT nasal spray Place 1 spray into both nostrils  daily.     . Glucosamine-Chondroit-Vit C-Mn (GLUCOSAMINE 1500 COMPLEX PO) Take 2 tablets by mouth in the morning.     Marland Kitchen L-Lysine 500 MG TABS Take 1 tablet by mouth in the morning.     Marland Kitchen LORazepam (ATIVAN) 1 MG tablet Take 1 mg by mouth at bedtime as needed.    . methocarbamol (ROBAXIN) 500 MG tablet Take 1 tablet (500 mg total) by mouth every 6 (six) hours as needed for muscle spasms. (Patient taking differently: Take 500 mg by mouth as needed for muscle spasms. Takes 1/2 tablet prn) 60 tablet 1  . Mometasone Furoate (ASMANEX HFA) 100 MCG/ACT AERO Inhale 1 puff into the lungs at bedtime.     . montelukast (SINGULAIR) 10 MG tablet Take 10 mg by mouth at bedtime.    . Multiple Vitamin (MULTIVITAMIN) capsule Take 1 capsule by mouth daily.    Marland Kitchen omeprazole (PRILOSEC) 20 MG capsule Take 20 mg by mouth 2 (two) times daily before a meal.     . Probiotic Product (ALIGN) 4 MG CAPS Take 4 mg by mouth daily.     . psyllium (METAMUCIL) 58.6 % powder Take 1 packet by mouth 2 (two) times daily.    . VENTOLIN HFA 108 (90 Base) MCG/ACT inhaler Inhale 1-2 puffs into the lungs as needed.     . vitamin C (ASCORBIC ACID) 500 MG tablet Take 500 mg by mouth daily.    Marland Kitchen zolpidem (AMBIEN) 10 MG tablet Take 10 mg by mouth at bedtime as needed.      No current facility-administered medications for this visit.    Allergies:   Patient has no known allergies.   Social History:  The patient  reports that he has quit smoking. His smoking use included cigarettes. He has a 10.00 pack-year smoking history. He has quit using smokeless tobacco. He reports current alcohol use of about 7.0 standard drinks of alcohol per week. He reports that he does not use drugs.   ROS:  Please see the history of present illness.   All other systems are personally reviewed and negative.    Exam:    Vital Signs:  BP (!) 146/78   Pulse (!) 53   Ht 5\' 10"  (1.778 m)   Wt 205 lb (93 kg)   BMI 29.41 kg/m  Well sounding and appearing, alert  and conversant, regular work of breathing,  good skin color Eyes- anicteric, neuro- grossly intact, skin- no apparent rash or lesions or cyanosis, mouth- oral mucosa is pink  Labs/Other Tests and Data Reviewed:    Recent Labs: No results found for requested labs within last 8760 hours.   Wt Readings from Last 3 Encounters:  10/22/19 205 lb (93 kg)  09/17/19 209 lb 6.4 oz (95 kg)  05/16/19 217 lb (98.4 kg)       ASSESSMENT & PLAN:    1.  paroxsymal atrial fibrillation The patient has symptomatic, recurrent paroxysmal atrial fibrillation. he has failed medical therapy with flecainide. Chads2vasc score is 3.  he is anticoagulated with eliquis He underwent afib ablation by me in 03/2018 and did very well   Unfortunately his afib has returned. Therapeutic strategies for afib including medicine (tikosyn) and repeat ablation were discussed in detail with the patient today. Risk, benefits, and alternatives to EP study and radiofrequency ablation for afib were also discussed in detail today. These risks include but are not limited to stroke, bleeding, vascular damage, tamponade, perforation, damage to the esophagus, lungs, and other structures, pulmonary vein stenosis, worsening renal function, and death. The patient understands these risk and wishes to proceed.  We will therefore proceed with catheter ablation at the next available time.  Carto, ICE, anesthesia are requested for the procedure.  Will also obtain cardiac CT prior to the procedure to exclude LAA thrombus and further evaluate atrial anatomy.    Risks, benefits and potential toxicities for medications prescribed and/or refilled reviewed with patient today.      Patient Risk:  after full review of this patients clinical status, I feel that they are at moderate risk at this time.  Today, I have spent 15 minutes with the patient with telehealth technology discussing arrhythmia management .    Army Fossa, MD  10/22/2019  9:12 AM     [facility] [address] [place] [address] [phone] (office) [phone] (fax)

## 2019-10-22 NOTE — Telephone Encounter (Signed)
Called patient ablation Aug 24,2021 1030  Labs ordered/scheduled  Covid ordered/scheduled  CT ordered  Patient aware

## 2019-10-22 NOTE — Telephone Encounter (Signed)
-----   Message from [name], MD sent at 10/22/2019  9:19 AM EDT ----- Afib ablation Carto/ICE/ anesthesia (CIA)   Cardiac CT prior

## 2019-10-22 NOTE — Progress Notes (Signed)
Electrophysiology TeleHealth Note   Due to national recommendations of social distancing due to COVID 19, an audio/video telehealth visit is felt to be most appropriate for this patient at this time.  See MyChart message from today for the patient's consent to telehealth for [facility].  Date:  10/22/2019   ID:  Brandon Alt., DOB 1945/04/05, MRN [id number]  Location: patient's home  Provider location:  [facility]  Evaluation Performed: Follow-up visit  PCP:  Leanna Battles, MD   Electrophysiologist:  Dr Rayann Heman  Chief Complaint:  palpitations  History of Present Illness:    Brandon Simmons. is a 74 y.o. male who presents via telehealth conferencing today.  Since last being seen in our clinic, the patient reports doing very well.  His has recenlty had increasing frequency and duration of his afib.  + palpitations and fatigue.   Today, he denies symptoms of chest pain, shortness of breath,  lower extremity edema, dizziness, presyncope, or syncope.  The patient is otherwise without complaint today.   Past Medical History:  Diagnosis Date  . Aortic atherosclerosis ([place])   . Arthritis   . Asthma   . Diverticulitis   . Diverticulosis   . Ectatic thoracic aorta ([place])    a. 4.3cm by CT 03/2018.  Marland Kitchen GERD (gastroesophageal reflux disease)   . Hemorrhoids   . History of echocardiogram    a. Echo 10/16 [facility]  EF 55%, trace MR, mild TR, mild to mod AI, mild dilated Ao root (41 mm)  . Hyperlipemia   . Organic erectile dysfunction   . PAF (paroxysmal atrial fibrillation) ([place])    a. admx to [facility] in [place], [place] 10/16 with AF with RVR >> converted to NSR with IV Dilt;  b. Flecainide started >> ETT neg for pro-arrhythmia   . Prostate cancer ([place]) 04/25/2006   Gleason 3+4=7  . Prostatitis   . S/P cardiac cath    a. LHC at [facility] 10/16:  LM ok, mLAD 30%, LCx ok, dRCA 20%  . S/P radiation therapy 01/19/2014 through 03/05/2014                                                       Prostate bed 6600 cGy in 33 sessions                          . Torn rotator cuff    right  40% tear    Past Surgical History:  Procedure Laterality Date  . ABLATION OF DYSRHYTHMIC FOCUS  03/28/2018  . APPENDECTOMY     1967  . ATRIAL FIBRILLATION ABLATION N/A 03/28/2018   Procedure: ATRIAL FIBRILLATION ABLATION;  Surgeon: [name], MD;  Location: [place] [facility];  Service: Cardiovascular;  Laterality: N/A;  . cataract surgery Left   . COLONOSCOPY    . KNEE ARTHROSCOPY     R  . LARYNX SURGERY     vocal cord lesion- benign  . LUMBAR LAMINECTOMY/DECOMPRESSION MICRODISCECTOMY Right 07/26/2017   Procedure: RIGHT LUMBAR 3- LUMBAR 4 FORAMINOTOMY WITH RESECTION OF SYNOVIAL CYST;  Surgeon: Erline Levine, MD;  Location: [place];  Service: Neurosurgery;  Laterality: Right;  RIGHT LUMBAR 3- LUMBAR 4 FORAMINOTOMY WITH RESECTION OF SYNOVIAL CYST  . POLYPECTOMY    .  PROSTATECTOMY  04/25/2006    Current Outpatient Medications  Medication Sig Dispense Refill  . acetaminophen (TYLENOL) 500 MG tablet Take 500 mg by mouth daily.    Marland Kitchen apixaban (ELIQUIS) 5 MG TABS tablet Take 5 mg by mouth 2 (two) times daily.     Marland Kitchen atorvastatin (LIPITOR) 40 MG tablet Take 20 mg by mouth daily.    . cholecalciferol (VITAMIN D) 1000 UNITS tablet Take 1,000 Units by mouth daily.    . diclofenac sodium (VOLTAREN) 1 % GEL Using 1-2 times daily    . diltiazem (CARDIZEM CD) 180 MG 24 hr capsule TAKE 1 CAPSULE BY MOUTH EVERY DAY 90 capsule 2  . diltiazem (CARDIZEM) 30 MG tablet Take 1 tablet every 4 hours AS NEEDED for afib rapid heart rate over 100 (Patient taking differently: Take 1 tablet AS NEEDED for afib rapid heart rate over 100) 45 tablet 1  . EPINEPHrine 0.3 mg/0.3 mL IJ SOAJ injection See admin instructions.    . flecainide (TAMBOCOR) 100 MG tablet TAKE 1 TABLET BY MOUTH TWICE A DAY 180 tablet 2  . fluticasone (FLONASE) 50 MCG/ACT nasal spray Place 1 spray into both nostrils  daily.     . Glucosamine-Chondroit-Vit C-Mn (GLUCOSAMINE 1500 COMPLEX PO) Take 2 tablets by mouth in the morning.     Marland Kitchen L-Lysine 500 MG TABS Take 1 tablet by mouth in the morning.     Marland Kitchen LORazepam (ATIVAN) 1 MG tablet Take 1 mg by mouth at bedtime as needed.    . methocarbamol (ROBAXIN) 500 MG tablet Take 1 tablet (500 mg total) by mouth every 6 (six) hours as needed for muscle spasms. (Patient taking differently: Take 500 mg by mouth as needed for muscle spasms. Takes 1/2 tablet prn) 60 tablet 1  . Mometasone Furoate (ASMANEX HFA) 100 MCG/ACT AERO Inhale 1 puff into the lungs at bedtime.     . montelukast (SINGULAIR) 10 MG tablet Take 10 mg by mouth at bedtime.    . Multiple Vitamin (MULTIVITAMIN) capsule Take 1 capsule by mouth daily.    Marland Kitchen omeprazole (PRILOSEC) 20 MG capsule Take 20 mg by mouth 2 (two) times daily before a meal.     . Probiotic Product (ALIGN) 4 MG CAPS Take 4 mg by mouth daily.     . psyllium (METAMUCIL) 58.6 % powder Take 1 packet by mouth 2 (two) times daily.    . VENTOLIN HFA 108 (90 Base) MCG/ACT inhaler Inhale 1-2 puffs into the lungs as needed.     . vitamin C (ASCORBIC ACID) 500 MG tablet Take 500 mg by mouth daily.    Marland Kitchen zolpidem (AMBIEN) 10 MG tablet Take 10 mg by mouth at bedtime as needed.      No current facility-administered medications for this visit.    Allergies:   Patient has no known allergies.   Social History:  The patient  reports that he has quit smoking. His smoking use included cigarettes. He has a 10.00 pack-year smoking history. He has quit using smokeless tobacco. He reports current alcohol use of about 7.0 standard drinks of alcohol per week. He reports that he does not use drugs.   ROS:  Please see the history of present illness.   All other systems are personally reviewed and negative.    Exam:    Vital Signs:  BP (!) 146/78   Pulse (!) 53   Ht 5\' 10"  (1.778 m)   Wt 205 lb (93 kg)   BMI 29.41 kg/m  Well sounding and appearing, alert  and conversant, regular work of breathing,  good skin color Eyes- anicteric, neuro- grossly intact, skin- no apparent rash or lesions or cyanosis, mouth- oral mucosa is pink  Labs/Other Tests and Data Reviewed:    Recent Labs: No results found for requested labs within last 8760 hours.   Wt Readings from Last 3 Encounters:  10/22/19 205 lb (93 kg)  09/17/19 209 lb 6.4 oz (95 kg)  05/16/19 217 lb (98.4 kg)       ASSESSMENT & PLAN:    1.  paroxsymal atrial fibrillation The patient has symptomatic, recurrent paroxysmal atrial fibrillation. he has failed medical therapy with flecainide. Chads2vasc score is 3.  he is anticoagulated with eliquis He underwent afib ablation by me in 03/2018 and did very well   Unfortunately his afib has returned. Therapeutic strategies for afib including medicine (tikosyn) and repeat ablation were discussed in detail with the patient today. Risk, benefits, and alternatives to EP study and radiofrequency ablation for afib were also discussed in detail today. These risks include but are not limited to stroke, bleeding, vascular damage, tamponade, perforation, damage to the esophagus, lungs, and other structures, pulmonary vein stenosis, worsening renal function, and death. The patient understands these risk and wishes to proceed.  We will therefore proceed with catheter ablation at the next available time.  Carto, ICE, anesthesia are requested for the procedure.  Will also obtain cardiac CT prior to the procedure to exclude LAA thrombus and further evaluate atrial anatomy.    Risks, benefits and potential toxicities for medications prescribed and/or refilled reviewed with patient today.      Patient Risk:  after full review of this patients clinical status, I feel that they are at moderate risk at this time.  Today, I have spent 15 minutes with the patient with telehealth technology discussing arrhythmia management .    Army Fossa, MD  10/22/2019  9:12 AM     [facility] [address] [place] [address] [phone] (office) [phone] (fax)

## 2019-10-23 ENCOUNTER — Encounter: Payer: Self-pay | Admitting: *Deleted

## 2019-10-27 ENCOUNTER — Telehealth (HOSPITAL_COMMUNITY): Payer: Self-pay

## 2019-10-27 NOTE — Telephone Encounter (Signed)
Patient called in and states he was in A-fib yesterday at 3 am and he came out of A-fib at 3pm yesterday after taking all of his medications.

## 2019-10-31 ENCOUNTER — Telehealth (HOSPITAL_COMMUNITY): Payer: Self-pay | Admitting: Emergency Medicine

## 2019-10-31 NOTE — Telephone Encounter (Signed)
Reaching out to patient to offer assistance regarding upcoming cardiac imaging study; pt verbalizes understanding of appt date/time, parking situation and where to check in, pre-test NPO status and medications ordered, and verified current allergies; name and call back number provided for further questions should they arise Marchia Bond RN [facility] [phone] office [phone] cell   Pt to have labs drawn on Monday 8/16

## 2019-11-03 ENCOUNTER — Other Ambulatory Visit: Payer: Self-pay

## 2019-11-03 ENCOUNTER — Ambulatory Visit: Payer: Medicare Other | Admitting: Internal Medicine

## 2019-11-03 ENCOUNTER — Other Ambulatory Visit: Payer: Medicare Other | Admitting: *Deleted

## 2019-11-03 DIAGNOSIS — I4891 Unspecified atrial fibrillation: Secondary | ICD-10-CM

## 2019-11-03 LAB — CBC WITH DIFFERENTIAL/PLATELET
Basophils Absolute: 0 10*3/uL (ref 0.0–0.2)
Basos: 1 %
EOS (ABSOLUTE): 0.1 10*3/uL (ref 0.0–0.4)
Eos: 2 %
Hematocrit: 40.2 % (ref 37.5–51.0)
Hemoglobin: 13.9 g/dL (ref 13.0–17.7)
Immature Grans (Abs): 0 10*3/uL (ref 0.0–0.1)
Immature Granulocytes: 0 %
Lymphocytes Absolute: 2 10*3/uL (ref 0.7–3.1)
Lymphs: 34 %
MCH: 31.7 pg (ref 26.6–33.0)
MCHC: 34.6 g/dL (ref 31.5–35.7)
MCV: 92 fL (ref 79–97)
Monocytes Absolute: 0.6 10*3/uL (ref 0.1–0.9)
Monocytes: 10 %
Neutrophils Absolute: 3.3 10*3/uL (ref 1.4–7.0)
Neutrophils: 53 %
Platelets: 163 10*3/uL (ref 150–450)
RBC: 4.38 x10E6/uL (ref 4.14–5.80)
RDW: 12 % (ref 11.6–15.4)
WBC: 6.1 10*3/uL (ref 3.4–10.8)

## 2019-11-03 LAB — BASIC METABOLIC PANEL
BUN/Creatinine Ratio: 15 (ref 10–24)
BUN: 17 mg/dL (ref 8–27)
CO2: 26 mmol/L (ref 20–29)
Calcium: 9.3 mg/dL (ref 8.6–10.2)
Chloride: 104 mmol/L (ref 96–106)
Creatinine, Ser: 1.12 mg/dL (ref 0.76–1.27)
GFR calc Af Amer: 74 mL/min/{1.73_m2} (ref >59)
GFR calc non Af Amer: 64 mL/min/{1.73_m2} (ref >59)
Glucose: 92 mg/dL (ref 65–99)
Potassium: 4 mmol/L (ref 3.5–5.2)
Sodium: 139 mmol/L (ref 134–144)

## 2019-11-04 ENCOUNTER — Ambulatory Visit (HOSPITAL_COMMUNITY)
Admission: RE | Admit: 2019-11-04 | Discharge: 2019-11-04 | Disposition: A | Discharge status: Home / Self Care | Payer: Medicare Other | Source: Ambulatory Visit | Attending: Internal Medicine | Admitting: Internal Medicine

## 2019-11-04 ENCOUNTER — Other Ambulatory Visit: Payer: Medicare Other

## 2019-11-04 DIAGNOSIS — J3089 Other allergic rhinitis: Secondary | ICD-10-CM | POA: Diagnosis not present

## 2019-11-04 DIAGNOSIS — I4891 Unspecified atrial fibrillation: Secondary | ICD-10-CM

## 2019-11-04 MED ORDER — IOHEXOL 350 MG/ML SOLN
80.0000 mL | Freq: Once | INTRAVENOUS | Status: AC | PRN
  Administered 2019-11-04: 80 mL via INTRAVENOUS

## 2019-11-08 ENCOUNTER — Other Ambulatory Visit (HOSPITAL_COMMUNITY)
Admission: RE | Admit: 2019-11-08 | Discharge: 2019-11-08 | Disposition: A | Discharge status: Home / Self Care | Payer: Medicare Other | Source: Ambulatory Visit | Attending: Internal Medicine | Admitting: Internal Medicine

## 2019-11-08 DIAGNOSIS — Z01812 Encounter for preprocedural laboratory examination: Secondary | ICD-10-CM | POA: Insufficient documentation

## 2019-11-08 DIAGNOSIS — Z20822 Contact with and (suspected) exposure to covid-19: Secondary | ICD-10-CM | POA: Diagnosis not present

## 2019-11-08 LAB — SARS CORONAVIRUS 2 (TAT 6-24 HRS): SARS Coronavirus 2: NEGATIVE

## 2019-11-10 NOTE — Progress Notes (Signed)
Instructed patient on the following items: Arrival time 0830 Nothing to eat or drink after midnight No meds AM of procedure Responsible person to drive you home and stay with you for 24 hrs- wife  Have you missed any doses of anti-coagulant on Eliquis, no doses missed.

## 2019-11-11 ENCOUNTER — Encounter (HOSPITAL_COMMUNITY)
Admission: RE | Disposition: A | Discharge status: Home / Self Care | Payer: Medicare Other | Source: Home / Self Care | Attending: Internal Medicine

## 2019-11-11 ENCOUNTER — Ambulatory Visit (HOSPITAL_COMMUNITY)
Admission: RE | Admit: 2019-11-11 | Discharge: 2019-11-11 | Disposition: A | Discharge status: Home / Self Care | Payer: Medicare Other | Attending: Internal Medicine | Admitting: Internal Medicine

## 2019-11-11 ENCOUNTER — Other Ambulatory Visit: Payer: Self-pay

## 2019-11-11 ENCOUNTER — Ambulatory Visit (HOSPITAL_COMMUNITY): Payer: Medicare Other | Admitting: Certified Registered"

## 2019-11-11 DIAGNOSIS — Z7901 Long term (current) use of anticoagulants: Secondary | ICD-10-CM | POA: Insufficient documentation

## 2019-11-11 DIAGNOSIS — M1990 Unspecified osteoarthritis, unspecified site: Secondary | ICD-10-CM | POA: Insufficient documentation

## 2019-11-11 DIAGNOSIS — K219 Gastro-esophageal reflux disease without esophagitis: Secondary | ICD-10-CM | POA: Diagnosis not present

## 2019-11-11 DIAGNOSIS — Z79899 Other long term (current) drug therapy: Secondary | ICD-10-CM | POA: Diagnosis not present

## 2019-11-11 DIAGNOSIS — Z9079 Acquired absence of other genital organ(s): Secondary | ICD-10-CM | POA: Diagnosis not present

## 2019-11-11 DIAGNOSIS — Z7951 Long term (current) use of inhaled steroids: Secondary | ICD-10-CM | POA: Diagnosis not present

## 2019-11-11 DIAGNOSIS — I480 Paroxysmal atrial fibrillation: Secondary | ICD-10-CM | POA: Insufficient documentation

## 2019-11-11 DIAGNOSIS — E785 Hyperlipidemia, unspecified: Secondary | ICD-10-CM | POA: Diagnosis not present

## 2019-11-11 DIAGNOSIS — J45909 Unspecified asthma, uncomplicated: Secondary | ICD-10-CM | POA: Diagnosis not present

## 2019-11-11 DIAGNOSIS — Z87891 Personal history of nicotine dependence: Secondary | ICD-10-CM | POA: Insufficient documentation

## 2019-11-11 DIAGNOSIS — C61 Malignant neoplasm of prostate: Secondary | ICD-10-CM | POA: Diagnosis not present

## 2019-11-11 DIAGNOSIS — I340 Nonrheumatic mitral (valve) insufficiency: Secondary | ICD-10-CM | POA: Diagnosis not present

## 2019-11-11 DIAGNOSIS — J449 Chronic obstructive pulmonary disease, unspecified: Secondary | ICD-10-CM | POA: Diagnosis not present

## 2019-11-11 DIAGNOSIS — Z8546 Personal history of malignant neoplasm of prostate: Secondary | ICD-10-CM | POA: Diagnosis not present

## 2019-11-11 HISTORY — PX: ATRIAL FIBRILLATION ABLATION: EP1191

## 2019-11-11 LAB — POCT ACTIVATED CLOTTING TIME
Activated Clotting Time: 252 seconds
Activated Clotting Time: 290 seconds

## 2019-11-11 SURGERY — ATRIAL FIBRILLATION ABLATION
Anesthesia: General

## 2019-11-11 MED ORDER — DEXAMETHASONE SODIUM PHOSPHATE 10 MG/ML IJ SOLN
INTRAMUSCULAR | Status: DC | PRN
  Administered 2019-11-11: 5 mg via INTRAVENOUS

## 2019-11-11 MED ORDER — SODIUM CHLORIDE 0.9% FLUSH: 3.0000 mL | INTRAVENOUS | Status: DC | PRN

## 2019-11-11 MED ORDER — ADENOSINE 6 MG/2ML IV SOLN
INTRAVENOUS | Status: AC
  Filled 2019-11-11: qty 2

## 2019-11-11 MED ORDER — SODIUM CHLORIDE 0.9 % IV SOLN: 250.0000 mL | INTRAVENOUS | Status: DC | PRN

## 2019-11-11 MED ORDER — HEPARIN SODIUM (PORCINE) 1000 UNIT/ML IJ SOLN
INTRAMUSCULAR | Status: AC
  Filled 2019-11-11: qty 2

## 2019-11-11 MED ORDER — ACETAMINOPHEN 325 MG PO TABS: 650.0000 mg | ORAL_TABLET | ORAL | Status: DC | PRN

## 2019-11-11 MED ORDER — ROCURONIUM BROMIDE 10 MG/ML (PF) SYRINGE
PREFILLED_SYRINGE | INTRAVENOUS | Status: DC | PRN
  Administered 2019-11-11: 100 mg via INTRAVENOUS

## 2019-11-11 MED ORDER — PHENYLEPHRINE HCL-NACL 10-0.9 MG/250ML-% IV SOLN
INTRAVENOUS | Status: DC | PRN
  Administered 2019-11-11: 25 ug/min via INTRAVENOUS

## 2019-11-11 MED ORDER — PROPOFOL 10 MG/ML IV BOLUS
INTRAVENOUS | Status: DC | PRN
  Administered 2019-11-11: 170 mg via INTRAVENOUS

## 2019-11-11 MED ORDER — HEPARIN SODIUM (PORCINE) 1000 UNIT/ML IJ SOLN
INTRAMUSCULAR | Status: DC | PRN
  Administered 2019-11-11: 14000 [IU] via INTRAVENOUS
  Administered 2019-11-11: 3000 [IU] via INTRAVENOUS

## 2019-11-11 MED ORDER — ISOPROTERENOL HCL 0.2 MG/ML IJ SOLN
INTRAMUSCULAR | Status: AC
  Filled 2019-11-11: qty 5

## 2019-11-11 MED ORDER — APIXABAN 5 MG PO TABS
5.0000 mg | ORAL_TABLET | Freq: Once | ORAL | Status: AC
  Administered 2019-11-11: 5 mg via ORAL
  Filled 2019-11-11: qty 1

## 2019-11-11 MED ORDER — ADENOSINE 6 MG/2ML IV SOLN
INTRAVENOUS | Status: DC | PRN
  Administered 2019-11-11: 12 mg via INTRAVENOUS

## 2019-11-11 MED ORDER — SUGAMMADEX SODIUM 200 MG/2ML IV SOLN
INTRAVENOUS | Status: DC | PRN
  Administered 2019-11-11: 200 mg via INTRAVENOUS

## 2019-11-11 MED ORDER — HEPARIN SODIUM (PORCINE) 1000 UNIT/ML IJ SOLN
INTRAMUSCULAR | Status: DC | PRN
  Administered 2019-11-11: 14000 [IU] via INTRAVENOUS
  Administered 2019-11-11: 1000 [IU] via INTRAVENOUS

## 2019-11-11 MED ORDER — PROTAMINE SULFATE 10 MG/ML IV SOLN
INTRAVENOUS | Status: DC | PRN
  Administered 2019-11-11: 40 mg via INTRAVENOUS

## 2019-11-11 MED ORDER — SODIUM CHLORIDE 0.9% FLUSH: 3.0000 mL | Freq: Two times a day (BID) | INTRAVENOUS | Status: DC

## 2019-11-11 MED ORDER — LACTATED RINGERS IV SOLN: INTRAVENOUS | Status: DC | PRN

## 2019-11-11 MED ORDER — ACETAMINOPHEN 500 MG PO TABS: 1000.0000 mg | ORAL_TABLET | Freq: Once | ORAL | Status: DC

## 2019-11-11 MED ORDER — ISOPROTERENOL HCL 0.2 MG/ML IJ SOLN
INTRAVENOUS | Status: DC | PRN
  Administered 2019-11-11: 10 ug/min via INTRAVENOUS

## 2019-11-11 MED ORDER — HEPARIN (PORCINE) IN NACL 1000-0.9 UT/500ML-% IV SOLN
INTRAVENOUS | Status: AC
  Filled 2019-11-11: qty 500

## 2019-11-11 MED ORDER — HYDROCODONE-ACETAMINOPHEN 5-325 MG PO TABS
ORAL_TABLET | ORAL | Status: AC
  Filled 2019-11-11: qty 1

## 2019-11-11 MED ORDER — FENTANYL CITRATE (PF) 250 MCG/5ML IJ SOLN
INTRAMUSCULAR | Status: DC | PRN
Start: 2019-11-11 — End: 2019-11-11
  Administered 2019-11-11: 100 ug via INTRAVENOUS

## 2019-11-11 MED ORDER — LIDOCAINE 2% (20 MG/ML) 5 ML SYRINGE
INTRAMUSCULAR | Status: DC | PRN
  Administered 2019-11-11: 100 mg via INTRAVENOUS

## 2019-11-11 MED ORDER — ONDANSETRON HCL 4 MG/2ML IJ SOLN
INTRAMUSCULAR | Status: DC | PRN
  Administered 2019-11-11: 4 mg via INTRAVENOUS

## 2019-11-11 MED ORDER — HYDROCODONE-ACETAMINOPHEN 5-325 MG PO TABS
1.0000 | ORAL_TABLET | ORAL | Status: DC | PRN
  Administered 2019-11-11 (×2): 1 via ORAL

## 2019-11-11 MED ORDER — HEPARIN (PORCINE) IN NACL 1000-0.9 UT/500ML-% IV SOLN
INTRAVENOUS | Status: DC | PRN
  Administered 2019-11-11 (×2): 500 mL

## 2019-11-11 MED ORDER — SODIUM CHLORIDE 0.9 % IV SOLN: INTRAVENOUS | Status: DC

## 2019-11-11 MED ORDER — ONDANSETRON HCL 4 MG/2ML IJ SOLN: 4.0000 mg | Freq: Four times a day (QID) | INTRAMUSCULAR | Status: DC | PRN

## 2019-11-11 SURGICAL SUPPLY — 20 items
BLANKET WARM UNDERBOD FULL ACC (MISCELLANEOUS) ×2 IMPLANT
CATH MAPPNG PENTARAY F 2-6-2MM (CATHETERS) IMPLANT
CATH S CIRCA THERM PROBE 10F (CATHETERS) ×1 IMPLANT
CATH SMTCH THERMOCOOL SF DF (CATHETERS) ×1 IMPLANT
CATH SOUNDSTAR ECO 8FR (CATHETERS) ×1 IMPLANT
CATH WEBSTER BI DIR CS D-F CRV (CATHETERS) ×1 IMPLANT
COVER SWIFTLINK CONNECTOR (BAG) ×2 IMPLANT
DEVICE CLOSURE PERCLS PRGLD 6F (VASCULAR PRODUCTS) IMPLANT
MAT PREVALON FULL STRYKER (MISCELLANEOUS) ×1 IMPLANT
PACK EP LATEX FREE (CUSTOM PROCEDURE TRAY) ×2
PACK EP LF (CUSTOM PROCEDURE TRAY) ×1 IMPLANT
PAD PRO RADIOLUCENT 2001M-C (PAD) ×2 IMPLANT
PATCH CARTO3 (PAD) ×1 IMPLANT
PENTARAY F 2-6-2MM (CATHETERS) ×2
PERCLOSE PROGLIDE 6F (VASCULAR PRODUCTS) ×6
SHEATH BAYLIS TRANSSEPTAL 98CM (NEEDLE) ×1 IMPLANT
SHEATH CARTO VIZIGO SM CVD (SHEATH) ×1 IMPLANT
SHEATH PINNACLE 8F 10CM (SHEATH) ×2 IMPLANT
SHEATH PINNACLE 9F 10CM (SHEATH) ×1 IMPLANT
TUBING SMART ABLATE COOLFLOW (TUBING) ×1 IMPLANT

## 2019-11-11 NOTE — Interval H&P Note (Signed)
History and Physical Interval Note:  11/11/2019 10:49 AM  Treon Oren Section.  has presented today for surgery, with the diagnosis of afib.  The various methods of treatment have been discussed with the patient and family. After consideration of risks, benefits and other options for treatment, the patient has consented to  Procedure(s): ATRIAL FIBRILLATION ABLATION (N/A) as a surgical intervention.  The patient's history has been reviewed, patient examined, no change in status, stable for surgery.  I have reviewed the patient's chart and labs.  Questions were answered to the patient's satisfaction.    Reports compliance with eliquis without interruption. Cardiac CT reviewed with patient at length today and reveals no LAA thrombus.  [name]

## 2019-11-11 NOTE — Anesthesia Preprocedure Evaluation (Addendum)
Anesthesia Evaluation  Patient identified by MRN, date of birth, ID band Patient awake    Reviewed: Allergy & Precautions, NPO status , Patient's Chart, lab work & pertinent test results  History of Anesthesia Complications Negative for: history of anesthetic complications  Airway Mallampati: II  TM Distance: >3 FB Neck ROM: Full    Dental no notable dental hx. (+) Dental Advisory Given   Pulmonary COPD,  COPD inhaler, former smoker,    Pulmonary exam normal        Cardiovascular hypertension, Pt. on medications and Pt. on home beta blockers (-) angina+ CAD ('16 cath: non-obstructive) and + Peripheral Vascular Disease  Normal cardiovascular exam+ dysrhythmias Atrial Fibrillation  Rhythm:Regular Rate:Normal  7/19 ECHO: EF 50% to 55%. Wall motion was normal; Mild AI. Mitral valve: Systolic bowing without prolapse. Mild ascending aorta dilation    Neuro/Psych negative neurological ROS     GI/Hepatic Neg liver ROS, GERD  Medicated and Controlled,  Endo/Other  obesity  Renal/GU negative Renal ROS   Prostate cancer    Musculoskeletal  (+) Arthritis ,   Abdominal (+) - obese,   Peds  Hematology eliquis   Anesthesia Other Findings   Reproductive/Obstetrics                            Anesthesia Physical  Anesthesia Plan  ASA: III  Anesthesia Plan: General   Post-op Pain Management:    Induction: Intravenous  PONV Risk Score and Plan: 2 and Ondansetron and Dexamethasone  Airway Management Planned: Oral ETT and LMA  Additional Equipment:   Intra-op Plan:   Post-operative Plan: Extubation in OR  Informed Consent: I have reviewed the patients History and Physical, chart, labs and discussed the procedure including the risks, benefits and alternatives for the proposed anesthesia with the patient or authorized representative who has indicated his/her understanding and acceptance.      Dental advisory given  Plan Discussed with: Anesthesiologist and CRNA  Anesthesia Plan Comments:        Anesthesia Quick Evaluation

## 2019-11-11 NOTE — Discharge Instructions (Signed)
Post procedure care instructions No driving for 4 days. No lifting over 5 lbs for 1 week. No vigorous or sexual activity for 1 week. You may return to work/your usual activities on 11/18/2019. Keep procedure site clean & dry. If you notice increased pain, swelling, bleeding or pus, call/return!  You may shower, but no soaking baths/hot tubs/pools for 1 week.     You have an appointment set up with the [facility].  Multiple studies have shown that being followed by a dedicated atrial fibrillation clinic in addition to the standard care you receive from your other physicians improves health. We believe that enrollment in the atrial fibrillation clinic will allow [place] to better care for you.   The phone number to the [facility] is [phone]. The clinic is staffed Monday through Friday from 8:30am to 5pm.  Parking Directions: The clinic is located in the Heart and Vascular Building connected to [facility].    Cardiac Ablation, Care After This sheet gives you information about how to care for yourself after your procedure. Your health care provider may also give you more specific instructions. If you have problems or questions, contact your health care provider. What can I expect after the procedure? After the procedure, it is common to have:  Bruising around your puncture site.  Tenderness around your puncture site.  Skipped heartbeats.  Tiredness (fatigue). Follow these instructions at home: Puncture site care   Follow instructions from your health care provider about how to take care of your puncture site. Make sure you: ? Wash your hands with soap and water before you change your bandage (dressing). If soap and water are not available, use hand sanitizer. ? Change your dressing as told by your health care provider. ? Leave stitches (sutures), skin glue, or adhesive strips in place. These skin closures may need to stay in place for up to 2 weeks.  If adhesive strip edges start to loosen and curl up, you may trim the loose edges. Do not remove adhesive strips completely unless your health care provider tells you to do that.  Check your puncture site every day for signs of infection. Check for: ? Redness, swelling, or pain. ? Fluid or blood. If your puncture site starts to bleed, lie down on your back, apply firm pressure to the area, and contact your health care provider. ? Warmth. ? Pus or a bad smell. Driving  Ask your health care provider when it is safe for you to drive again after the procedure.  Do not drive or use heavy machinery while taking prescription pain medicine.  Do not drive for 24 hours if you were given a medicine to help you relax (sedative) during your procedure. Activity  Avoid activities that take a lot of effort for at least 3 days after your procedure.  Do not lift anything that is heavier than 10 lb (4.5 kg), or the limit that you are told, until your health care provider says that it is safe.  Return to your normal activities as told by your health care provider. Ask your health care provider what activities are safe for you. General instructions  Take over-the-counter and prescription medicines only as told by your health care provider.  Do not use any products that contain nicotine or tobacco, such as cigarettes and e-cigarettes. If you need help quitting, ask your health care provider.  Do not take baths, swim, or use a hot tub until your health care provider approves.  Do not drink alcohol for 24 hours after your procedure.  Keep all follow-up visits as told by your health care provider. This is important. Contact a health care provider if:  You have redness, mild swelling, or pain around your puncture site.  You have fluid or blood coming from your puncture site that stops after applying firm pressure to the area.  Your puncture site feels warm to the touch.  You have pus or a bad smell  coming from your puncture site.  You have a fever.  You have chest pain or discomfort that spreads to your neck, jaw, or arm.  You are sweating a lot.  You feel nauseous.  You have a fast or irregular heartbeat.  You have shortness of breath.  You are dizzy or light-headed and feel the need to lie down.  You have pain or numbness in the arm or leg closest to your puncture site. Get help right away if:  Your puncture site suddenly swells.  Your puncture site is bleeding and the bleeding does not stop after applying firm pressure to the area. These symptoms may represent a serious problem that is an emergency. Do not wait to see if the symptoms will go away. Get medical help right away. Call your local emergency services (911 in the [place] Do not drive yourself to the hospital. Summary  After the procedure, it is normal to have bruising and tenderness at the puncture site in your groin, neck, or forearm.  Check your puncture site every day for signs of infection.  Get help right away if your puncture site is bleeding and the bleeding does not stop after applying firm pressure to the area. This is a medical emergency. This information is not intended to replace advice given to you by your health care provider. Make sure you discuss any questions you have with your health care provider. Document Revised: 02/16/2017 Document Reviewed: 06/15/2016 Elsevier Patient Education  [place].  1)From [address] turn on to [address] and go to the 3rd entrance  (Heart and Vascular entrance) on the right. 2)Look to the right for Heart &Vascular Parking Garage. 3)A code for the entrance is required, for August is 4008.   4)Take the elevators to the 1st floor. Registration is in the room with the glass walls at the end of the hallway.  If you have any trouble parking or locating the clinic, please don't hesitate to call [phone].

## 2019-11-11 NOTE — Anesthesia Postprocedure Evaluation (Signed)
Anesthesia Post Note  Patient: Brandon Simmons.  Procedure(s) Performed: ATRIAL FIBRILLATION ABLATION (N/A )     Patient location during evaluation: PACU Anesthesia Type: General Level of consciousness: sedated Pain management: pain level controlled Vital Signs Assessment: post-procedure vital signs reviewed and stable Respiratory status: spontaneous breathing and respiratory function stable Cardiovascular status: stable Postop Assessment: no apparent nausea or vomiting Anesthetic complications: no   No complications documented.  Last Vitals:  Vitals:   11/11/19 1424 11/11/19 1425  BP:  124/65  Pulse:  (!) 55  Resp:  17  Temp: 36.9 C   SpO2:  99%    Last Pain:  Vitals:   11/11/19 1424  TempSrc: Temporal  PainSc:                  [name]

## 2019-11-11 NOTE — Transfer of Care (Signed)
Immediate Anesthesia Transfer of Care Note  Patient: Brandon Simmons.  Procedure(s) Performed: ATRIAL FIBRILLATION ABLATION (N/A )  Patient Location: Cath Lab  Anesthesia Type:General  Level of Consciousness: awake, alert  and oriented  Airway & Oxygen Therapy: Patient Spontanous Breathing and Patient connected to nasal cannula oxygen  Post-op Assessment: Report given to RN, Post -op Vital signs reviewed and stable and Patient moving all extremities X 4  Post vital signs: Reviewed and stable  Last Vitals:  Vitals Value Taken Time  BP 127/61 11/11/19 1349  Temp    Pulse 62 11/11/19 1354  Resp 13 11/11/19 1354  SpO2 100 % 11/11/19 1354  Vitals shown include unvalidated device data.  Last Pain:  Vitals:   11/11/19 0925  TempSrc:   PainSc: 0-No pain      Patients Stated Pain Goal: 5 (32/91/91 6606)  Complications: No complications documented.

## 2019-11-11 NOTE — Anesthesia Procedure Notes (Signed)
Procedure Name: Intubation Date/Time: 11/11/2019 11:26 AM Performed by: Gaylene Brooks, CRNA Pre-anesthesia Checklist: Patient identified, Emergency Drugs available, Suction available and Patient being monitored Patient Re-evaluated:Patient Re-evaluated prior to induction Oxygen Delivery Method: Circle System Utilized Preoxygenation: Pre-oxygenation with 100% oxygen Induction Type: IV induction Ventilation: Mask ventilation without difficulty Laryngoscope Size: Miller and 2 Grade View: Grade I Tube type: Oral Tube size: 7.5 mm Number of attempts: 1 Airway Equipment and Method: Stylet and Oral airway Placement Confirmation: ETT inserted through vocal cords under direct vision,  positive ETCO2 and breath sounds checked- equal and bilateral Secured at: 22 cm Tube secured with: Tape Dental Injury: Teeth and Oropharynx as per pre-operative assessment

## 2019-11-12 ENCOUNTER — Encounter (HOSPITAL_COMMUNITY): Payer: Self-pay | Admitting: Internal Medicine

## 2019-11-14 ENCOUNTER — Ambulatory Visit: Payer: Medicare Other | Admitting: Internal Medicine

## 2019-11-17 ENCOUNTER — Other Ambulatory Visit: Payer: Self-pay

## 2019-11-17 ENCOUNTER — Encounter (HOSPITAL_COMMUNITY): Payer: Self-pay | Admitting: Nurse Practitioner

## 2019-11-17 ENCOUNTER — Ambulatory Visit (HOSPITAL_COMMUNITY)
Admission: RE | Admit: 2019-11-17 | Discharge: 2019-11-17 | Disposition: A | Discharge status: Home / Self Care | Payer: Medicare Other | Source: Ambulatory Visit | Attending: Nurse Practitioner | Admitting: Nurse Practitioner

## 2019-11-17 VITALS — BP 136/74 | HR 69 | Ht 70.0 in | Wt 211.0 lb

## 2019-11-17 DIAGNOSIS — K5790 Diverticulosis of intestine, part unspecified, without perforation or abscess without bleeding: Secondary | ICD-10-CM | POA: Diagnosis not present

## 2019-11-17 DIAGNOSIS — Z7952 Long term (current) use of systemic steroids: Secondary | ICD-10-CM | POA: Insufficient documentation

## 2019-11-17 DIAGNOSIS — Z923 Personal history of irradiation: Secondary | ICD-10-CM | POA: Diagnosis not present

## 2019-11-17 DIAGNOSIS — Z79899 Other long term (current) drug therapy: Secondary | ICD-10-CM | POA: Insufficient documentation

## 2019-11-17 DIAGNOSIS — E785 Hyperlipidemia, unspecified: Secondary | ICD-10-CM | POA: Insufficient documentation

## 2019-11-17 DIAGNOSIS — M1990 Unspecified osteoarthritis, unspecified site: Secondary | ICD-10-CM | POA: Insufficient documentation

## 2019-11-17 DIAGNOSIS — I480 Paroxysmal atrial fibrillation: Secondary | ICD-10-CM | POA: Insufficient documentation

## 2019-11-17 DIAGNOSIS — I700 Atherosclerosis of aorta: Secondary | ICD-10-CM | POA: Diagnosis not present

## 2019-11-17 DIAGNOSIS — Z87891 Personal history of nicotine dependence: Secondary | ICD-10-CM | POA: Insufficient documentation

## 2019-11-17 DIAGNOSIS — I4891 Unspecified atrial fibrillation: Secondary | ICD-10-CM | POA: Diagnosis not present

## 2019-11-17 DIAGNOSIS — Z8546 Personal history of malignant neoplasm of prostate: Secondary | ICD-10-CM | POA: Insufficient documentation

## 2019-11-17 DIAGNOSIS — Z7901 Long term (current) use of anticoagulants: Secondary | ICD-10-CM | POA: Insufficient documentation

## 2019-11-17 DIAGNOSIS — D6869 Other thrombophilia: Secondary | ICD-10-CM | POA: Diagnosis not present

## 2019-11-17 DIAGNOSIS — K219 Gastro-esophageal reflux disease without esophagitis: Secondary | ICD-10-CM | POA: Diagnosis not present

## 2019-11-17 NOTE — Progress Notes (Signed)
Primary Care Physician: Leanna Battles, MD Referring Physician:Dr. Geroge Baseman Roy[place] Bekele. is a 74 y.o. male with a h/o paroxysmal afib that is in the afib clinic for onset afib last pm.  He  had ablation in 03/2018. He had to go back on daily flecainide and Cardizem as he had some stuttering afib after trying to stop antiarrythmic at 3 month f/u.  He has enjoyed sinus rhythm since being back on daily meds until last night. He also had Rocky Spotted Tick fever within the last few weeks and was treated with doxycycline. He had a tooth pulled 2/2 to infection last week and just finished amoxicillin. He stopped anticoagulation for 1 day  then resumed Friday. He is also having new onset of pain in his rt leg that his PCP does not think is related to tick bite but possibly 2/2 back issues. He tried an extra 30 mg Cardizem last night but felt like it bottomed out his BP.   F/u in afib clinic, 11/17/19. He had a repeat ablation last week for increase in afib burden. Over the weekend, he felt weak and lightheaded. Yesterday, he did not feel weill with HR's in the 40's to the 70's. He did not feel this to be afib but would note several regular bests then a pause in his  heart rhythm. He felt  lightheaded with this. When this was happening  he also felt "hollow" in his chest. Today feels better and EKG shows NSR. His flecainide and CCB was stopped at time of ablation.   Today, he denies symptoms of palpitations, chest pain, shortness of breath, orthopnea, PND, lower extremity edema, dizziness, presyncope, syncope, or neurologic sequela. The patient is tolerating medications without difficulties and is otherwise without complaint today.   Past Medical History:  Diagnosis Date  . Aortic atherosclerosis ([place])   . Arthritis   . Asthma   . Diverticulitis   . Diverticulosis   . Ectatic thoracic aorta ([place])    a. 4.3cm by CT 03/2018.  Marland Kitchen GERD (gastroesophageal reflux disease)   . Hemorrhoids   . History  of echocardiogram    a. Echo 10/16 [facility]):  EF 55%, trace MR, mild TR, mild to mod AI, mild dilated Ao root (41 mm)  . Hyperlipemia   . Organic erectile dysfunction   . PAF (paroxysmal atrial fibrillation) ([place])    a. admx to [facility] in [place], [place] 10/16 with AF with RVR >> converted to NSR with IV Dilt;  b. Flecainide started >> ETT neg for pro-arrhythmia   . Prostate cancer ([place]) 04/25/2006   Gleason 3+4=7  . Prostatitis   . S/P cardiac cath    a. LHC at [facility] 10/16:  LM ok, mLAD 30%, LCx ok, dRCA 20%  . S/P radiation therapy 01/19/2014 through 03/05/2014                                                      Prostate bed 6600 cGy in 33 sessions                          . Torn rotator cuff    right  40% tear   Past Surgical History:  Procedure Laterality Date  . ABLATION OF DYSRHYTHMIC FOCUS  03/28/2018  . APPENDECTOMY     1967  . ATRIAL FIBRILLATION ABLATION N/A 03/28/2018   Procedure: ATRIAL FIBRILLATION ABLATION;  Surgeon: [name], MD;  Location: [facility];  Service: Cardiovascular;  Laterality: N/A;  . ATRIAL FIBRILLATION ABLATION N/A 11/11/2019   Procedure: ATRIAL FIBRILLATION ABLATION;  Surgeon: [name], MD;  Location: [facility];  Service: Cardiovascular;  Laterality: N/A;  . cataract surgery Left   . COLONOSCOPY    . KNEE ARTHROSCOPY     R  . LARYNX SURGERY     vocal cord lesion- benign  . LUMBAR LAMINECTOMY/DECOMPRESSION MICRODISCECTOMY Right 07/26/2017   Procedure: RIGHT LUMBAR 3- LUMBAR 4 FORAMINOTOMY WITH RESECTION OF SYNOVIAL CYST;  Surgeon: Erline Levine, MD;  Location: [facility];  Service: Neurosurgery;  Laterality: Right;  RIGHT LUMBAR 3- LUMBAR 4 FORAMINOTOMY WITH RESECTION OF SYNOVIAL CYST  . POLYPECTOMY    . PROSTATECTOMY  04/25/2006    Current Outpatient Medications  Medication Sig Dispense Refill  . acetaminophen (TYLENOL) 500 MG tablet Take 500 mg by mouth at bedtime.     Marland Kitchen apixaban (ELIQUIS) 5 MG TABS tablet  Take 5 mg by mouth 2 (two) times daily.     Marland Kitchen atorvastatin (LIPITOR) 40 MG tablet Take 20 mg by mouth daily.     . cholecalciferol (VITAMIN D) 1000 UNITS tablet Take 1,000 Units by mouth daily.    . diclofenac sodium (VOLTAREN) 1 % GEL Apply 2 g topically in the morning and at bedtime.     Marland Kitchen diltiazem (CARDIZEM) 30 MG tablet Take 1 tablet every 4 hours AS NEEDED for afib rapid heart rate over 100 (Patient taking differently: Take 30 mg by mouth See admin instructions. Take 30 mg tablet AS NEEDED for afib rapid heart rate over 100) 45 tablet 1  . EPINEPHrine 0.3 mg/0.3 mL IJ SOAJ injection Inject 0.3 mg into the muscle as needed for anaphylaxis.     . fluticasone (FLONASE) 50 MCG/ACT nasal spray Place 1 spray into both nostrils at bedtime.     . Glucosamine-Chondroit-Vit C-Mn (GLUCOSAMINE 1500 COMPLEX PO) Take 1,500 mg by mouth in the morning.     Marland Kitchen HYDROcodone-acetaminophen (NORCO/VICODIN) 5-325 MG tablet Take 0.5 tablets by mouth every 6 (six) hours as needed for moderate pain (When traveling).    Marland Kitchen L-Lysine 500 MG TABS Take 500 mg by mouth in the morning.     Marland Kitchen LORazepam (ATIVAN) 1 MG tablet Take 1 mg by mouth at bedtime as needed for sleep (When traveling).     . methocarbamol (ROBAXIN) 500 MG tablet Take 1 tablet (500 mg total) by mouth every 6 (six) hours as needed for muscle spasms. (Patient taking differently: Take 250 mg by mouth daily as needed (Nerve pain). ) 60 tablet 1  . Mometasone Furoate (ASMANEX HFA) 100 MCG/ACT AERO Inhale 1 puff into the lungs at bedtime.     . montelukast (SINGULAIR) 10 MG tablet Take 10 mg by mouth at bedtime.    . Multiple Vitamin (MULTIVITAMIN) capsule Take 1 capsule by mouth daily.    . Naphazoline-Pheniramine (OPCON-A) 0.027-0.315 % SOLN Place 1 drop into both eyes daily as needed (allergies).     Marland Kitchen omeprazole (PRILOSEC) 20 MG capsule Take 20 mg by mouth 2 (two) times daily before a meal.     . OVER THE COUNTER MEDICATION Apply 1 application topically at  bedtime. CBD lotion on joints    . predniSONE (DELTASONE) 10 MG tablet Take 10-20 mg by mouth daily  as needed (Asthma).    Marland Kitchen PRESCRIPTION MEDICATION Inject 1 application into the skin every 14 (fourteen) days. Allergy injections    . Probiotic Product (ALIGN) 4 MG CAPS Take 4 mg by mouth daily.     . psyllium (METAMUCIL) 58.6 % powder Take 2 packets by mouth 2 (two) times daily.     . VENTOLIN HFA 108 (90 Base) MCG/ACT inhaler Inhale 1-2 puffs into the lungs as needed for wheezing or shortness of breath.     . vitamin C (ASCORBIC ACID) 500 MG tablet Take 500 mg by mouth daily.    Marland Kitchen zolpidem (AMBIEN) 10 MG tablet Take 10 mg by mouth at bedtime as needed for sleep.      No current facility-administered medications for this encounter.    No Known Allergies  Social History   Socioeconomic History  . Marital status: Married    Spouse name: Not on file  . Number of children: 2  . Years of education: Not on file  . Highest education level: Not on file  Occupational History  . 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 Occupation: Optometrist  Tobacco Use  . Smoking status: Former Smoker    Packs/day: 1.00    Years: 10.00    Pack years: 10.00    Types: Cigarettes  . Smokeless tobacco: Former Systems developer  . Tobacco comment: 1972  Vaping Use  . Vaping Use: Never used  Substance and Sexual Activity  . Alcohol use: Yes    Alcohol/week: 7.0 standard drinks    Types: 7 Glasses of wine per week    Comment: 1 glass wine or beer daily  . Drug use: No  . Sexual activity: Not on file  Other Topics Concern  . Not on file  Social History Narrative   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  Consultant for schools.   Travels over seas    Social Determinants of Health   Financial Resource Strain:   . Difficulty of Paying Living Expenses: Not on file  Food Insecurity:   . Worried About Charity fundraiser in the Last Year: Not on file  . Ran Out of Food in the Last Year: Not on file  Transportation Needs:   . Lack of  Transportation (Medical): Not on file  . Lack of Transportation (Non-Medical): Not on file  Physical Activity:   . Days of Exercise per Week: Not on file  . Minutes of Exercise per Session: Not on file  Stress:   . Feeling of Stress : Not on file  Social Connections:   . Frequency of Communication with Friends and Family: Not on file  . Frequency of Social Gatherings with Friends and Family: Not on file  . Attends Religious Services: Not on file  . Active Member of Clubs or Organizations: Not on file  . Attends Archivist Meetings: Not on file  . Marital Status: Not on file  Intimate Partner Violence:   . Fear of Current or Ex-Partner: Not on file  . Emotionally Abused: Not on file  . Physically Abused: Not on file  . Sexually Abused: Not on file    Family History  Problem Relation Age of Onset  . Emphysema Father   . Lung cancer Father   . Hypertension Mother   . Diabetes Maternal Grandmother   . Heart disease Maternal Grandfather        Died age 41  . Liver cancer Paternal Grandmother   . Heart disease Paternal Grandfather  Died age 22  . Liver cancer Other   . Lung cancer Other   . Atrial fibrillation Son     ROS- All systems are reviewed and negative except as per the HPI above  Physical Exam: Vitals:   11/17/19 1409  BP: 136/74  Pulse: 69  Weight: 95.7 kg  Height: 5\' 10"  (1.778 m)   Wt Readings from Last 3 Encounters:  11/17/19 95.7 kg  11/11/19 93 kg  10/22/19 93 kg    Labs: Lab Results  Component Value Date   NA 139 11/03/2019   K 4.0 11/03/2019   CL 104 11/03/2019   CO2 26 11/03/2019   GLUCOSE 92 11/03/2019   BUN 17 11/03/2019   CREATININE 1.12 11/03/2019   CALCIUM 9.3 11/03/2019   MG 2.2 02/11/2018   Lab Results  Component Value Date   INR 1.04 02/11/2018   No results found for: CHOL, HDL, LDLCALC, TRIG   GEN- The patient is well appearing, alert and oriented x 3 today.   Head- normocephalic, atraumatic Eyes-  Sclera  clear, conjunctiva pink Ears- hearing intact Oropharynx- clear Neck- supple, no JVP Lymph- no cervical lymphadenopathy Lungs- Clear to ausculation bilaterally, normal work of breathing Heart- irregular rate and rhythm, no murmurs, rubs or gallops, PMI not laterally displaced GI- soft, NT, ND, + BS Extremities- no clubbing, cyanosis, or edema MS- no significant deformity or atrophy Skin- no rash or lesion Psych- euthymic mood, full affect Neuro- strength and sensation are intact  EKG-NSR at 69 bpm, PR int 118 ms, qrs int 92 ms, qtc 400 ms    Assessment and Plan: 1. Paroxysmal afib S/p ablation 03/2018 and repeat ablation 11/11/19 Had some symptoms yesterday that sound like PC's Feels better today and NSR today  Reassured  Flecainide and CCB stopped on time of second ablation   2. Chadsvasc score of 3  Continue  eliquis 5 mg bid    F/u as scheduled 9/21 in afib clinic Sooner if needed   Butch Penny C. Macaulay Reicher, [facility] [address] [place], [place] [address] [phone]

## 2019-11-18 ENCOUNTER — Encounter: Payer: Self-pay | Admitting: *Deleted

## 2019-11-18 DIAGNOSIS — J3089 Other allergic rhinitis: Secondary | ICD-10-CM | POA: Diagnosis not present

## 2019-11-23 ENCOUNTER — Other Ambulatory Visit: Payer: Self-pay | Admitting: Internal Medicine

## 2019-11-26 DIAGNOSIS — M7062 Trochanteric bursitis, left hip: Secondary | ICD-10-CM | POA: Diagnosis not present

## 2019-11-26 DIAGNOSIS — M25552 Pain in left hip: Secondary | ICD-10-CM | POA: Diagnosis not present

## 2019-12-02 DIAGNOSIS — J3089 Other allergic rhinitis: Secondary | ICD-10-CM | POA: Diagnosis not present

## 2019-12-03 DIAGNOSIS — C61 Malignant neoplasm of prostate: Secondary | ICD-10-CM | POA: Diagnosis not present

## 2019-12-09 ENCOUNTER — Ambulatory Visit (HOSPITAL_COMMUNITY): Payer: Medicare Other | Admitting: Nurse Practitioner

## 2019-12-09 DIAGNOSIS — C61 Malignant neoplasm of prostate: Secondary | ICD-10-CM | POA: Diagnosis not present

## 2019-12-17 ENCOUNTER — Encounter (HOSPITAL_COMMUNITY): Payer: Self-pay | Admitting: Nurse Practitioner

## 2019-12-17 ENCOUNTER — Other Ambulatory Visit: Payer: Self-pay

## 2019-12-17 ENCOUNTER — Ambulatory Visit (HOSPITAL_COMMUNITY)
Admission: RE | Admit: 2019-12-17 | Discharge: 2019-12-17 | Disposition: A | Discharge status: Home / Self Care | Payer: Medicare Other | Source: Ambulatory Visit | Attending: Nurse Practitioner | Admitting: Nurse Practitioner

## 2019-12-17 VITALS — BP 136/78 | HR 61 | Ht 70.0 in | Wt 208.0 lb

## 2019-12-17 DIAGNOSIS — Z7951 Long term (current) use of inhaled steroids: Secondary | ICD-10-CM | POA: Insufficient documentation

## 2019-12-17 DIAGNOSIS — I480 Paroxysmal atrial fibrillation: Secondary | ICD-10-CM

## 2019-12-17 DIAGNOSIS — I700 Atherosclerosis of aorta: Secondary | ICD-10-CM | POA: Diagnosis not present

## 2019-12-17 DIAGNOSIS — E785 Hyperlipidemia, unspecified: Secondary | ICD-10-CM | POA: Insufficient documentation

## 2019-12-17 DIAGNOSIS — D6869 Other thrombophilia: Secondary | ICD-10-CM | POA: Diagnosis not present

## 2019-12-17 DIAGNOSIS — J3089 Other allergic rhinitis: Secondary | ICD-10-CM | POA: Diagnosis not present

## 2019-12-17 DIAGNOSIS — Z79899 Other long term (current) drug therapy: Secondary | ICD-10-CM | POA: Diagnosis not present

## 2019-12-17 DIAGNOSIS — Z7952 Long term (current) use of systemic steroids: Secondary | ICD-10-CM | POA: Insufficient documentation

## 2019-12-17 DIAGNOSIS — Z87891 Personal history of nicotine dependence: Secondary | ICD-10-CM | POA: Diagnosis not present

## 2019-12-17 DIAGNOSIS — J301 Allergic rhinitis due to pollen: Secondary | ICD-10-CM | POA: Diagnosis not present

## 2019-12-17 DIAGNOSIS — Z7901 Long term (current) use of anticoagulants: Secondary | ICD-10-CM | POA: Insufficient documentation

## 2019-12-17 DIAGNOSIS — K219 Gastro-esophageal reflux disease without esophagitis: Secondary | ICD-10-CM | POA: Diagnosis not present

## 2019-12-17 NOTE — Progress Notes (Signed)
Primary Care Physician: Leanna Battles, MD Referring Physician:Dr. Geroge Baseman Brandon Simmons. is a 74 y.o. male with a h/o paroxysmal afib that is in the afib clinic for onset afib last pm.  He  had ablation in 03/2018. He had to go back on daily flecainide and Cardizem as he had some stuttering afib after trying to stop antiarrythmic at 3 month f/u.  He has enjoyed sinus rhythm since being back on daily meds until last night. He also had Rocky Spotted Tick fever within the last few weeks and was treated with doxycycline. He had a tooth pulled 2/2 to infection last week and just finished amoxicillin. He stopped anticoagulation for 1 day  then resumed Friday. He is also having new onset of pain in his rt leg that his PCP does not think is related to tick bite but possibly 2/2 back issues. He tried an extra 30 mg Cardizem last night but felt like it bottomed out his BP.   F/u in afib clinic, 11/17/19. He had a repeat ablation last week for increase in afib burden. Over the weekend, he felt weak and lightheaded. Yesterday, he did not feel weill with HR's in the 40's to the 70's. He did not feel this to be afib but would note several regular bests then a pause in his  heart rhythm. He felt  lightheaded with this. When this was happening  he also felt "hollow" in his chest. Today feels better and EKG shows NSR. His flecainide and CCB was stopped at time of ablation.   F/u in afib clinic 12/17/19, one month s/p ablation. He feels great. No afib to appreciate. Had palpitations early on after ablation that sounded more like PC's, these have resolved. No swallowing or groin issues.   Today, he denies symptoms of palpitations, chest pain, shortness of breath, orthopnea, PND, lower extremity edema, dizziness, presyncope, syncope, or neurologic sequela. The patient is tolerating medications without difficulties and is otherwise without complaint today.   Past Medical History:  Diagnosis Date  . Aortic  atherosclerosis ([place])   . Arthritis   . Asthma   . Diverticulitis   . Diverticulosis   . Ectatic thoracic aorta ([place])    a. 4.3cm by CT 03/2018.  Marland Kitchen GERD (gastroesophageal reflux disease)   . Hemorrhoids   . History of echocardiogram    a. Echo 10/16 [facility]):  EF 55%, trace MR, mild TR, mild to mod AI, mild dilated Ao root (41 mm)  . Hyperlipemia   . Organic erectile dysfunction   . PAF (paroxysmal atrial fibrillation) ([place])    a. admx to [facility] in [place], [place] 10/16 with AF with RVR >> converted to NSR with IV Dilt;  b. Flecainide started >> ETT neg for pro-arrhythmia   . Prostate cancer ([place]) 04/25/2006   Gleason 3+4=7  . Prostatitis   . S/P cardiac cath    a. LHC at [facility] 10/16:  LM ok, mLAD 30%, LCx ok, dRCA 20%  . S/P radiation therapy 01/19/2014 through 03/05/2014                                                      Prostate bed 6600 cGy in 33 sessions                          .  Torn rotator cuff    right  40% tear   Past Surgical History:  Procedure Laterality Date  . ABLATION OF DYSRHYTHMIC FOCUS  03/28/2018  . APPENDECTOMY     1967  . ATRIAL FIBRILLATION ABLATION N/A 03/28/2018   Procedure: ATRIAL FIBRILLATION ABLATION;  Surgeon: [name], MD;  Location: [place] [facility];  Service: Cardiovascular;  Laterality: N/A;  . ATRIAL FIBRILLATION ABLATION N/A 11/11/2019   Procedure: ATRIAL FIBRILLATION ABLATION;  Surgeon: [name], MD;  Location: [place] [facility];  Service: Cardiovascular;  Laterality: N/A;  . cataract surgery Left   . COLONOSCOPY    . KNEE ARTHROSCOPY     R  . LARYNX SURGERY     vocal cord lesion- benign  . LUMBAR LAMINECTOMY/DECOMPRESSION MICRODISCECTOMY Right 07/26/2017   Procedure: RIGHT LUMBAR 3- LUMBAR 4 FORAMINOTOMY WITH RESECTION OF SYNOVIAL CYST;  Surgeon: Erline Levine, MD;  Location: [place];  Service: Neurosurgery;  Laterality: Right;  RIGHT LUMBAR 3- LUMBAR 4 FORAMINOTOMY WITH RESECTION OF SYNOVIAL CYST  .  POLYPECTOMY    . PROSTATECTOMY  04/25/2006    Current Outpatient Medications  Medication Sig Dispense Refill  . acetaminophen (TYLENOL) 500 MG tablet Take 500 mg by mouth at bedtime.     Marland Kitchen apixaban (ELIQUIS) 5 MG TABS tablet Take 5 mg by mouth 2 (two) times daily.     Marland Kitchen atorvastatin (LIPITOR) 40 MG tablet Take 20 mg by mouth daily.     . cholecalciferol (VITAMIN D) 1000 UNITS tablet Take 1,000 Units by mouth daily.    . diclofenac sodium (VOLTAREN) 1 % GEL Apply 2 g topically in the morning and at bedtime.     Marland Kitchen diltiazem (CARDIZEM) 30 MG tablet Take 1 tablet every 4 hours AS NEEDED for afib rapid heart rate over 100 (Patient taking differently: Take 30 mg by mouth See admin instructions. Take 30 mg tablet AS NEEDED for afib rapid heart rate over 100) 45 tablet 1  . EPINEPHrine 0.3 mg/0.3 mL IJ SOAJ injection Inject 0.3 mg into the muscle as needed for anaphylaxis.     . fluticasone (FLONASE) 50 MCG/ACT nasal spray Place 1 spray into both nostrils at bedtime.     . Glucosamine-Chondroit-Vit C-Mn (GLUCOSAMINE 1500 COMPLEX PO) Take 1,500 mg by mouth in the morning.     Marland Kitchen HYDROcodone-acetaminophen (NORCO/VICODIN) 5-325 MG tablet Take 0.5 tablets by mouth as needed for moderate pain (When traveling).     Marland Kitchen L-Lysine 500 MG TABS Take 500 mg by mouth in the morning.     Marland Kitchen LORazepam (ATIVAN) 1 MG tablet Take 1 mg by mouth at bedtime as needed for sleep (When traveling).     . methocarbamol (ROBAXIN) 500 MG tablet Take 1 tablet (500 mg total) by mouth every 6 (six) hours as needed for muscle spasms. (Patient taking differently: Take 250 mg by mouth daily as needed (Nerve pain). ) 60 tablet 1  . Mometasone Furoate (ASMANEX HFA) 100 MCG/ACT AERO Inhale 1 puff into the lungs at bedtime.     . montelukast (SINGULAIR) 10 MG tablet Take 10 mg by mouth at bedtime.    . Multiple Vitamin (MULTIVITAMIN) capsule Take 1 capsule by mouth daily.    . Naphazoline-Pheniramine (OPCON-A) 0.027-0.315 % SOLN Place 1 drop  into both eyes daily as needed (allergies).     Marland Kitchen omeprazole (PRILOSEC) 20 MG capsule Take 20 mg by mouth 2 (two) times daily before a meal.     . OVER THE COUNTER MEDICATION Apply 1  application topically at bedtime. CBD lotion on joints    . predniSONE (DELTASONE) 10 MG tablet Take 10-20 mg by mouth daily as needed (Asthma).    Marland Kitchen PRESCRIPTION MEDICATION Inject 1 application into the skin every 14 (fourteen) days. Allergy injections    . Probiotic Product (ALIGN) 4 MG CAPS Take 4 mg by mouth daily.     . psyllium (METAMUCIL) 58.6 % powder Take 2 packets by mouth 2 (two) times daily.     . VENTOLIN HFA 108 (90 Base) MCG/ACT inhaler Inhale 1-2 puffs into the lungs as needed for wheezing or shortness of breath.     . vitamin C (ASCORBIC ACID) 500 MG tablet Take 500 mg by mouth daily.    Marland Kitchen zolpidem (AMBIEN) 10 MG tablet Take 10 mg by mouth at bedtime as needed for sleep.      No current facility-administered medications for this encounter.    No Known Allergies  Social History   Socioeconomic History  . Marital status: Married    Spouse name: Not on file  . Number of children: 2  . Years of education: Not on file  . Highest education level: Not on file  Occupational History  . 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 Occupation: Optometrist  Tobacco Use  . Smoking status: Former Smoker    Packs/day: 1.00    Years: 10.00    Pack years: 10.00    Types: Cigarettes  . Smokeless tobacco: Former Systems developer  . Tobacco comment: 1972  Vaping Use  . Vaping Use: Never used  Substance and Sexual Activity  . Alcohol use: Yes    Alcohol/week: 7.0 standard drinks    Types: 7 Glasses of wine per week    Comment: 1 glass wine or beer daily  . Drug use: No  . Sexual activity: Not on file  Other Topics Concern  . Not on file  Social History Narrative   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  Consultant for schools.   Travels over seas    Social Determinants of Health   Financial Resource Strain:   . Difficulty of  Paying Living Expenses: Not on file  Food Insecurity:   . Worried About Charity fundraiser in the Last Year: Not on file  . Ran Out of Food in the Last Year: Not on file  Transportation Needs:   . Lack of Transportation (Medical): Not on file  . Lack of Transportation (Non-Medical): Not on file  Physical Activity:   . Days of Exercise per Week: Not on file  . Minutes of Exercise per Session: Not on file  Stress:   . Feeling of Stress : Not on file  Social Connections:   . Frequency of Communication with Friends and Family: Not on file  . Frequency of Social Gatherings with Friends and Family: Not on file  . Attends Religious Services: Not on file  . Active Member of Clubs or Organizations: Not on file  . Attends Archivist Meetings: Not on file  . Marital Status: Not on file  Intimate Partner Violence:   . Fear of Current or Ex-Partner: Not on file  . Emotionally Abused: Not on file  . Physically Abused: Not on file  . Sexually Abused: Not on file    Family History  Problem Relation Age of Onset  . Emphysema Father   . Lung cancer Father   . Hypertension Mother   . Diabetes Maternal Grandmother   . Heart disease Maternal Grandfather  Died age 36  . Liver cancer Paternal Grandmother   . Heart disease Paternal Grandfather        Died age 3  . Liver cancer Other   . Lung cancer Other   . Atrial fibrillation Son     ROS- All systems are reviewed and negative except as per the HPI above  Physical Exam: Vitals:   12/17/19 1328  BP: 136/78  Pulse: 61  Weight: 94.3 kg  Height: 5\' 10"  (1.778 m)   Wt Readings from Last 3 Encounters:  12/17/19 94.3 kg  11/17/19 95.7 kg  11/11/19 93 kg    Labs: Lab Results  Component Value Date   NA 139 11/03/2019   K 4.0 11/03/2019   CL 104 11/03/2019   CO2 26 11/03/2019   GLUCOSE 92 11/03/2019   BUN 17 11/03/2019   CREATININE 1.12 11/03/2019   CALCIUM 9.3 11/03/2019   MG 2.2 02/11/2018   Lab Results   Component Value Date   INR 1.04 02/11/2018   No results found for: CHOL, HDL, LDLCALC, TRIG   GEN- The patient is well appearing, alert and oriented x 3 today.   Head- normocephalic, atraumatic Eyes-  Sclera clear, conjunctiva pink Ears- hearing intact Oropharynx- clear Neck- supple, no JVP Lymph- no cervical lymphadenopathy Lungs- Clear to ausculation bilaterally, normal work of breathing Heart- regular rate and rhythm, no murmurs, rubs or gallops, PMI not laterally displaced GI- soft, NT, ND, + BS Extremities- no clubbing, cyanosis, or edema MS- no significant deformity or atrophy Skin- no rash or lesion Psych- euthymic mood, full affect Neuro- strength and sensation are intact  EKG-NSR at 61 bpm, PR int 174 ms, qrs int 90 ms, qtc 394 ms    Assessment and Plan: 1. Paroxysmal afib S/p ablation 03/2018 and repeat ablation 11/11/19 Feels great and is in  NSR    Flecainide and CCB stopped at time of second ablation   2. Chadsvasc score of 3  Continue  eliquis 5 mg bid    F/u as scheduled with Dr. Rayann Heman 11/22  Geroge Baseman. Toneshia Coello, [facility] [address] [place], [place] [address] [phone]

## 2019-12-22 DIAGNOSIS — Z23 Encounter for immunization: Secondary | ICD-10-CM | POA: Diagnosis not present

## 2019-12-30 DIAGNOSIS — J301 Allergic rhinitis due to pollen: Secondary | ICD-10-CM | POA: Diagnosis not present

## 2019-12-30 DIAGNOSIS — J3089 Other allergic rhinitis: Secondary | ICD-10-CM | POA: Diagnosis not present

## 2020-01-14 DIAGNOSIS — J301 Allergic rhinitis due to pollen: Secondary | ICD-10-CM | POA: Diagnosis not present

## 2020-01-14 DIAGNOSIS — J3089 Other allergic rhinitis: Secondary | ICD-10-CM | POA: Diagnosis not present

## 2020-01-15 DIAGNOSIS — J3089 Other allergic rhinitis: Secondary | ICD-10-CM | POA: Diagnosis not present

## 2020-01-15 DIAGNOSIS — J3081 Allergic rhinitis due to animal (cat) (dog) hair and dander: Secondary | ICD-10-CM | POA: Diagnosis not present

## 2020-01-15 DIAGNOSIS — Z23 Encounter for immunization: Secondary | ICD-10-CM | POA: Diagnosis not present

## 2020-01-23 DIAGNOSIS — E785 Hyperlipidemia, unspecified: Secondary | ICD-10-CM | POA: Diagnosis not present

## 2020-01-28 DIAGNOSIS — J3089 Other allergic rhinitis: Secondary | ICD-10-CM | POA: Diagnosis not present

## 2020-01-28 DIAGNOSIS — J301 Allergic rhinitis due to pollen: Secondary | ICD-10-CM | POA: Diagnosis not present

## 2020-02-09 ENCOUNTER — Encounter: Payer: Self-pay | Admitting: Internal Medicine

## 2020-02-09 ENCOUNTER — Ambulatory Visit (INDEPENDENT_AMBULATORY_CARE_PROVIDER_SITE_OTHER): Payer: Medicare Other | Admitting: Internal Medicine

## 2020-02-09 ENCOUNTER — Other Ambulatory Visit: Payer: Self-pay

## 2020-02-09 VITALS — BP 136/80 | HR 71 | Ht 70.0 in | Wt 206.4 lb

## 2020-02-09 DIAGNOSIS — D6869 Other thrombophilia: Secondary | ICD-10-CM

## 2020-02-09 DIAGNOSIS — I480 Paroxysmal atrial fibrillation: Secondary | ICD-10-CM

## 2020-02-09 NOTE — Progress Notes (Signed)
PCP: Leanna Battles, MD  Brandon Simmons. is a 74 y.o. male who presents today for routine electrophysiology followup.  Since his recent afib ablation, the patient reports doing very well.  he denies procedure related complications and is pleased with the results of the procedure.  Today, he denies symptoms of palpitations, chest pain, shortness of breath,  lower extremity edema, dizziness, presyncope, or syncope.  The patient is otherwise without complaint today.   Past Medical History:  Diagnosis Date  . Aortic atherosclerosis ([place])   . Arthritis   . Asthma   . Diverticulitis   . Diverticulosis   . Ectatic thoracic aorta ([place])    a. 4.3cm by CT 03/2018.  Marland Kitchen GERD (gastroesophageal reflux disease)   . Hemorrhoids   . History of echocardiogram    a. Echo 10/16 [facility]):  EF 55%, trace MR, mild TR, mild to mod AI, mild dilated Ao root (41 mm)  . Hyperlipemia   . Organic erectile dysfunction   . PAF (paroxysmal atrial fibrillation) ([place])    a. admx to [facility] in [place], [place] 10/16 with AF with RVR >> converted to NSR with IV Dilt;  b. Flecainide started >> ETT neg for pro-arrhythmia   . Prostate cancer ([place]) 04/25/2006   Gleason 3+4=7  . Prostatitis   . S/P cardiac cath    a. LHC at [facility] 10/16:  LM ok, mLAD 30%, LCx ok, dRCA 20%  . S/P radiation therapy 01/19/2014 through 03/05/2014                                                      Prostate bed 6600 cGy in 33 sessions                          . Torn rotator cuff    right  40% tear   Past Surgical History:  Procedure Laterality Date  . ABLATION OF DYSRHYTHMIC FOCUS  03/28/2018  . APPENDECTOMY     1967  . ATRIAL FIBRILLATION ABLATION N/A 03/28/2018   Procedure: ATRIAL FIBRILLATION ABLATION;  Surgeon: [name], MD;  Location: [place] [facility];  Service: Cardiovascular;  Laterality: N/A;  . ATRIAL FIBRILLATION ABLATION N/A 11/11/2019   Procedure: ATRIAL FIBRILLATION ABLATION;  Surgeon: [name], MD;  Location: [place] [facility];  Service: Cardiovascular;  Laterality: N/A;  . cataract surgery Left   . COLONOSCOPY    . KNEE ARTHROSCOPY     R  . LARYNX SURGERY     vocal cord lesion- benign  . LUMBAR LAMINECTOMY/DECOMPRESSION MICRODISCECTOMY Right 07/26/2017   Procedure: RIGHT LUMBAR 3- LUMBAR 4 FORAMINOTOMY WITH RESECTION OF SYNOVIAL CYST;  Surgeon: Erline Levine, MD;  Location: [place];  Service: Neurosurgery;  Laterality: Right;  RIGHT LUMBAR 3- LUMBAR 4 FORAMINOTOMY WITH RESECTION OF SYNOVIAL CYST  . POLYPECTOMY    . PROSTATECTOMY  04/25/2006    ROS- all systems are personally reviewed and negatives except as per HPI above  Current Outpatient Medications  Medication Sig Dispense Refill  . acetaminophen (TYLENOL) 500 MG tablet Take 500 mg by mouth at bedtime.     Marland Kitchen apixaban (ELIQUIS) 5 MG TABS tablet Take 5 mg by mouth 2 (two) times daily.     Marland Kitchen atorvastatin (LIPITOR) 40 MG  tablet Take 20 mg by mouth daily.     . cholecalciferol (VITAMIN D) 1000 UNITS tablet Take 1,000 Units by mouth daily.    . diclofenac sodium (VOLTAREN) 1 % GEL Apply 2 g topically in the morning and at bedtime.     Marland Kitchen diltiazem (CARDIZEM) 30 MG tablet Take 1 tablet every 4 hours AS NEEDED for afib rapid heart rate over 100 45 tablet 1  . EPINEPHrine 0.3 mg/0.3 mL IJ SOAJ injection Inject 0.3 mg into the muscle as needed for anaphylaxis.     . fluticasone (FLONASE) 50 MCG/ACT nasal spray Place 1 spray into both nostrils at bedtime.     . Glucosamine-Chondroit-Vit C-Mn (GLUCOSAMINE 1500 COMPLEX PO) Take 1,500 mg by mouth in the morning.     Marland Kitchen HYDROcodone-acetaminophen (NORCO/VICODIN) 5-325 MG tablet Take 0.5 tablets by mouth as needed for moderate pain (When traveling).     Marland Kitchen L-Lysine 500 MG TABS Take 500 mg by mouth in the morning.     Marland Kitchen LORazepam (ATIVAN) 1 MG tablet Take 1 mg by mouth at bedtime as needed for sleep (When traveling).     . methocarbamol (ROBAXIN) 500 MG tablet Take 1 tablet (500 mg total) by  mouth every 6 (six) hours as needed for muscle spasms. 60 tablet 1  . Mometasone Furoate (ASMANEX HFA) 100 MCG/ACT AERO Inhale 1 puff into the lungs at bedtime.     . montelukast (SINGULAIR) 10 MG tablet Take 10 mg by mouth at bedtime.    . Multiple Vitamin (MULTIVITAMIN) capsule Take 1 capsule by mouth daily.    . Naphazoline-Pheniramine (OPCON-A) 0.027-0.315 % SOLN Place 1 drop into both eyes daily as needed (allergies).     Marland Kitchen omeprazole (PRILOSEC) 20 MG capsule Take 20 mg by mouth 2 (two) times daily before a meal.     . OVER THE COUNTER MEDICATION Apply 1 application topically at bedtime. CBD lotion on joints    . predniSONE (DELTASONE) 10 MG tablet Take 10-20 mg by mouth daily as needed (Asthma).    Marland Kitchen PRESCRIPTION MEDICATION Inject 1 application into the skin every 14 (fourteen) days. Allergy injections    . Probiotic Product (ALIGN) 4 MG CAPS Take 4 mg by mouth daily.     . psyllium (METAMUCIL) 58.6 % powder Take 2 packets by mouth 2 (two) times daily.     . VENTOLIN HFA 108 (90 Base) MCG/ACT inhaler Inhale 1-2 puffs into the lungs as needed for wheezing or shortness of breath.     . vitamin C (ASCORBIC ACID) 500 MG tablet Take 500 mg by mouth daily.    Marland Kitchen zolpidem (AMBIEN) 10 MG tablet Take 10 mg by mouth at bedtime as needed for sleep.      No current facility-administered medications for this visit.    Physical Exam: Vitals:   02/09/20 1050  BP: 136/80  Pulse: 71  SpO2: 96%  Weight: 206 lb 6.4 oz (93.6 kg)  Height: 5\' 10"  (1.778 m)    GEN- The patient is well appearing, alert and oriented x 3 today.   Head- normocephalic, atraumatic Eyes-  Sclera clear, conjunctiva pink Ears- hearing intact Oropharynx- clear Lungs- Clear to ausculation bilaterally, normal work of breathing Heart- Regular rate and rhythm, no murmurs, rubs or gallops, PMI not laterally displaced GI- soft, NT, ND, + BS Extremities- no clubbing, cyanosis, or edema  EKG tracing ordered today is personally  reviewed and shows sinus rhythm  Assessment and Plan:  1. Paroxysmal atrial fibrillation Doing well  s/p ablation off AAD therapy chads2vasc score is 3.  He is on eliquis  2. HL Continue statin  Return to see me in 4 months  [name] MD, [facility] 02/09/2020 10:57 AM

## 2020-02-09 NOTE — Patient Instructions (Addendum)
Medication Instructions:  Your physician recommends that you continue on your current medications as directed. Please refer to the Current Medication list given to you today.  *If you need a refill on your cardiac medications before your next appointment, please call your pharmacy*  Lab Work: None ordered.  If you have labs (blood work) drawn today and your tests are completely normal, you will receive your results only by: Marland Kitchen MyChart Message (if you have MyChart) OR . A paper copy in the mail If you have any lab test that is abnormal or we need to change your treatment, we will call you to review the results.  Testing/Procedures: None ordered.  Follow-Up: At [facility], you and your health needs are our priority.  As part of our continuing mission to provide you with exceptional heart care, we have created designated Provider Care Teams.  These Care Teams include your primary Cardiologist (physician) and Advanced Practice Providers (APPs -  Physician Assistants and Nurse Practitioners) who all work together to provide you with the care you need, when you need it.  We recommend signing up for the patient portal called "MyChart".  Sign up information is provided on this After Visit Summary.  MyChart is used to connect with patients for Virtual Visits (Telemedicine).  Patients are able to view lab/test results, encounter notes, upcoming appointments, etc.  Non-urgent messages can be sent to your provider as well.   To learn more about what you can do with MyChart, go to [email or web address].    Your next appointment:   Your physician wants you to follow-up in: 06/07/20 at 9:45 am with Dr. Rayann Heman.   Other Instructions:

## 2020-02-10 DIAGNOSIS — J3081 Allergic rhinitis due to animal (cat) (dog) hair and dander: Secondary | ICD-10-CM | POA: Diagnosis not present

## 2020-02-10 DIAGNOSIS — J3089 Other allergic rhinitis: Secondary | ICD-10-CM | POA: Diagnosis not present

## 2020-02-10 DIAGNOSIS — J301 Allergic rhinitis due to pollen: Secondary | ICD-10-CM | POA: Diagnosis not present

## 2020-02-20 DIAGNOSIS — H52203 Unspecified astigmatism, bilateral: Secondary | ICD-10-CM | POA: Diagnosis not present

## 2020-02-20 DIAGNOSIS — H26492 Other secondary cataract, left eye: Secondary | ICD-10-CM | POA: Diagnosis not present

## 2020-02-20 DIAGNOSIS — Z961 Presence of intraocular lens: Secondary | ICD-10-CM | POA: Diagnosis not present

## 2020-02-20 DIAGNOSIS — H43813 Vitreous degeneration, bilateral: Secondary | ICD-10-CM | POA: Diagnosis not present

## 2020-02-23 ENCOUNTER — Encounter: Payer: Self-pay | Admitting: Nurse Practitioner

## 2020-02-24 DIAGNOSIS — J301 Allergic rhinitis due to pollen: Secondary | ICD-10-CM | POA: Diagnosis not present

## 2020-02-24 DIAGNOSIS — J3089 Other allergic rhinitis: Secondary | ICD-10-CM | POA: Diagnosis not present

## 2020-03-01 DIAGNOSIS — M5416 Radiculopathy, lumbar region: Secondary | ICD-10-CM | POA: Diagnosis not present

## 2020-03-01 DIAGNOSIS — M4126 Other idiopathic scoliosis, lumbar region: Secondary | ICD-10-CM | POA: Diagnosis not present

## 2020-03-01 DIAGNOSIS — M4316 Spondylolisthesis, lumbar region: Secondary | ICD-10-CM | POA: Diagnosis not present

## 2020-03-01 DIAGNOSIS — M48061 Spinal stenosis, lumbar region without neurogenic claudication: Secondary | ICD-10-CM | POA: Diagnosis not present

## 2020-03-01 DIAGNOSIS — M545 Low back pain, unspecified: Secondary | ICD-10-CM | POA: Diagnosis not present

## 2020-03-08 DIAGNOSIS — C61 Malignant neoplasm of prostate: Secondary | ICD-10-CM | POA: Diagnosis not present

## 2020-03-16 ENCOUNTER — Ambulatory Visit (INDEPENDENT_AMBULATORY_CARE_PROVIDER_SITE_OTHER): Payer: Medicare Other | Admitting: Nurse Practitioner

## 2020-03-16 ENCOUNTER — Telehealth: Payer: Self-pay

## 2020-03-16 ENCOUNTER — Encounter: Payer: Self-pay | Admitting: Nurse Practitioner

## 2020-03-16 ENCOUNTER — Encounter: Payer: Self-pay | Admitting: Family

## 2020-03-16 VITALS — BP 118/74 | HR 72 | Ht 68.25 in | Wt 210.0 lb

## 2020-03-16 DIAGNOSIS — K219 Gastro-esophageal reflux disease without esophagitis: Secondary | ICD-10-CM | POA: Diagnosis not present

## 2020-03-16 DIAGNOSIS — J3089 Other allergic rhinitis: Secondary | ICD-10-CM | POA: Diagnosis not present

## 2020-03-16 DIAGNOSIS — J301 Allergic rhinitis due to pollen: Secondary | ICD-10-CM | POA: Diagnosis not present

## 2020-03-16 DIAGNOSIS — Z1211 Encounter for screening for malignant neoplasm of colon: Secondary | ICD-10-CM | POA: Diagnosis not present

## 2020-03-16 DIAGNOSIS — Z8546 Personal history of malignant neoplasm of prostate: Secondary | ICD-10-CM | POA: Diagnosis not present

## 2020-03-16 MED ORDER — SUPREP BOWEL PREP KIT 17.5-3.13-1.6 GM/177ML PO SOLN: 1.0000 | ORAL | 0 refills | Status: DC

## 2020-03-16 NOTE — Telephone Encounter (Signed)
Request for surgical clearance:     Endoscopy Procedure  What type of surgery is being performed?     Colonoscopy  When is this surgery scheduled?     04/16/2020  What type of clearance is required ?   Pharmacy  Are there any medications that need to be held prior to surgery and how long? Eliquis 2 day hold  Practice name and name of physician performing surgery?      [facility]  What is your office phone and fax number?      Phone- [phone]  [phone]  Anesthesia type (None, local, MAC, general) ?       MAC

## 2020-03-16 NOTE — Patient Instructions (Addendum)
If you are age 75 or older, your body mass index should be between 23-30. Your Body mass index is 31.7 kg/m. If this is out of the aforementioned range listed, please consider follow up with your Primary Care Provider.  If you are age 62 or younger, your body mass index should be between 19-25. Your Body mass index is 31.7 kg/m. If this is out of the aformentioned range listed, please consider follow up with your Primary Care Provider.    You have been scheduled for a colonoscopy. Please follow written instructions given to you at your visit today.  Please pick up your prep supplies at the pharmacy within the next 1-3 days. If you use inhalers (even only as needed), please bring them with you on the day of your procedure.    It was great seeing you today!  Thank you for entrusting me with your care and choosing [facility].  Alcide Evener, NP

## 2020-03-16 NOTE — Telephone Encounter (Signed)
   Primary Cardiologist: Rollene Rotunda, MD  Chart reviewed as part of pre-operative protocol coverage. Of note, clearance requested for pharmacy clearance only. Mr.  Brandon Simmons. is on Eliquis due to atrial fibrillation. He has CHADS2VASc of 3 and CrCl 55 ml/min. Per PharmD review and office protocol, he may hold Eliquis for 2 days prior to the procedure.   I will route this recommendation to the requesting party via Epic fax function and remove from pre-op pool.  Please call with questions.  Alver Sorrow, NP 03/16/2020, 4:21 PM

## 2020-03-16 NOTE — Progress Notes (Addendum)
03/16/2020 Brandon Simmons. [id number] January 11, 1946   CHIEF COMPLAINT: Mucous per the rectum   HISTORY OF PRESENT ILLNESS: Brandon Simmons is a 74 year old male with a past medical history of arthritis, asthma, atrial fibrillation s/p ablation x 03/2018 and 10/2019 on Eliquis, RLE DVT post back surgery 2019, prostate cancer initially diagnosed in 2007 s/p prostatectomy 04/2006 with recurrence in 2015 s/p radiation PSA. 5.2 -> 4.21 -> 4.71, diverticulitis and GERD. Additional surgical history includes an appendectomy 1967, 2 vocal cord surgeries in the early 2000's, right knee arthroscopy mid 2000's, multiple prostate biopsies, left cataract surgery 11/2011 and right cataract surgery 09/2015 and back surgery cyst and partial vertebra removal 07/2017.   He presents to our office today to establish his GI care with Dr. Tarri Glenn.  His wife is a patient of Dr. Tarri Glenn.  He complains of having a change in bowel pattern over the past few months.  His bowel movements were previously firmer.  He reports passing 2-3 bowel movements daily.  His first bowel movement is normal and solid in the next 2 bowel movements are described as "stringy".  Every once in a while he passes nonbloody mucus per the rectum.  Occasional fecal incontinence. No tenesmus.  He occasionally sees a small amount of bright red blood on the tissue which occurs approximately once every 3 months.  He is concerned his anal/rectal symptoms may be related to his prostate cancer and past radiation treatment.  Infrequent right lower abdominal pain which can last for 1 day then goes away.  He underwent a colonoscopy 1991, 2003, 2008 and 05/2011 which showed left-sided diverticulosis without evidence of colon polyps.  No family history of colorectal cancer.  History of GERD.  He takes Omeprazole 20 mg twice daily for more than 10 years.  He denies having any dysphagia, heartburn or upper abdominal pain.  Many years ago, he stopped taking Omeprazole  for 1 week which resulted in significant reflux/indigestion.  Since that time, he remains compliant with taking Omeprazole without interruption.  He denies ever having an EGD.  History of atrial fibrillation status post ablation x2, last ablation was 10/2019.  He remains in a normal sinus rhythm.  He is on Eliquis.  He was last seen by his cardiologist Dr. Rayann Heman on 02/09/2020.  At that time, his cardiac exam was stable and he was in a sinus rhythm without chest pain, palpitations or shortness of breath.   CBC Latest Ref Rng & Units 11/03/2019 08/23/2018 03/14/2018  WBC 3.4 - 10.8 x10E3/uL 6.1 7.0 5.8  Hemoglobin 13.0 - 17.7 g/dL 13.9 15.4 15.1  Hematocrit 37.5 - 51.0 % 40.2 46.3 45.3  Platelets 150 - 450 x10E3/uL 163 159 170   CMP Latest Ref Rng & Units 11/03/2019 08/23/2018 03/14/2018  Glucose 65 - 99 mg/dL 92 122(H) 125(H)  BUN 8 - 27 mg/dL 17 17 13   Creatinine 0.76 - 1.27 mg/dL 1.12 1.31(H) 1.04  Sodium 134 - 144 mmol/L 139 143 141  Potassium 3.5 - 5.2 mmol/L 4.0 3.8 3.6  Chloride 96 - 106 mmol/L 104 110 110  CO2 20 - 29 mmol/L 26 22 25   Calcium 8.6 - 10.2 mg/dL 9.3 9.6 9.1  Total Protein 6.5 - 8.1 g/dL - 7.0 -  Total Bilirubin 0.3 - 1.2 mg/dL - 0.6 -  Alkaline Phos 38 - 126 U/L - 57 -  AST 15 - 41 U/L - 32 -  Simmons 0 - 44 U/L - 24 -  PET scan 05/27/2019: 1. No evidence of local prostate cancer recurrence. 2. No evidence of metastatic lymphadenopathy. 3. No evidence of visceral metastasis or skeletal metastasis  Colonoscopy 06/13/2011 Dr. Marina Goodell:  Diverticulosis to the left colon No polyps Small internal hemorrhoid 10-year recall  Colonoscopy 10/26/2006 by Dr. Terrial Rhodes: Diverticulosis to the left colon No polyp  Colonoscopy 11/24/2001 Dr. Marina Goodell: Diverticulosis to the left colon Inflammation to the sigmoid colon Internal hemorrhoid  Echo 08/24/2018: 1. The left ventricle has normal systolic function with an ejection  fraction of 60-65%. The cavity size was normal. Left  ventricular diastolic  Doppler parameters are consistent with pseudonormalization.  2. The right ventricle has normal systolic function. The cavity was  normal. There is no increase in right ventricular wall thickness.  3. No evidence of mitral valve stenosis.  4. Aortic valve regurgitation is mild by color flow Doppler. No stenosis  of the aortic valve.  5. There is mild dilatation of the aortic root and of the ascending  aorta.  6. The interatrial septum was not assessed.  Past Medical History:  Diagnosis Date  . Aortic atherosclerosis (HCC)   . Arthritis   . Asthma   . Diverticulitis   . Diverticulosis   . DVT (deep venous thrombosis) (HCC)    right  . Ectatic thoracic aorta (HCC)    a. 4.3cm by CT 03/2018.  Marland Kitchen GERD (gastroesophageal reflux disease)   . Hemorrhoids   . History of echocardiogram    a. Echo 10/16 [facility]):  EF 55%, trace MR, mild TR, mild to mod AI, mild dilated Ao root (41 mm)  . Hyperlipemia   . Organic erectile dysfunction   . PAF (paroxysmal atrial fibrillation) (HCC)    a. admx to [facility] in [place], [place] 62/26 with AF with RVR >> converted to NSR with IV Dilt;  b. Flecainide started >> ETT neg for pro-arrhythmia   . Prostate cancer (HCC) 04/25/2006   Gleason 3+4=7  . Prostatitis   . S/P cardiac cath    a. LHC at [facility] 10/16:  LM ok, mLAD 30%, LCx ok, dRCA 20%  . S/P radiation therapy 01/19/2014 through 03/05/2014                                                      Prostate bed 6600 cGy in 33 sessions                          . Torn rotator cuff    right  40% tear   Past Surgical History:  Procedure Laterality Date  . ABLATION OF DYSRHYTHMIC FOCUS  03/28/2018  . APPENDECTOMY  1967  . ATRIAL FIBRILLATION ABLATION N/A 03/28/2018   Procedure: ATRIAL FIBRILLATION ABLATION;  Surgeon: Hillis Range, MD;  Location: [facility];  Service: Cardiovascular;  Laterality: N/A;  . ATRIAL FIBRILLATION ABLATION N/A 11/11/2019    Procedure: ATRIAL FIBRILLATION ABLATION;  Surgeon: Hillis Range, MD;  Location: [facility];  Service: Cardiovascular;  Laterality: N/A;  . CATARACT EXTRACTION W/ INTRAOCULAR LENS IMPLANT Left 11/2011  . CATARACT EXTRACTION W/ INTRAOCULAR LENS IMPLANT Right 09/2015  . COLONOSCOPY    . LARYNX SURGERY  2000   vocal cord lesion- benign  . LUMBAR LAMINECTOMY/DECOMPRESSION MICRODISCECTOMY Right 07/26/2017  Procedure: RIGHT LUMBAR 3- LUMBAR 4 FORAMINOTOMY WITH RESECTION OF SYNOVIAL CYST;  Surgeon: Erline Levine, MD;  Location: [place];  Service: Neurosurgery;  Laterality: Right;  RIGHT LUMBAR 3- LUMBAR 4 FORAMINOTOMY WITH RESECTION OF SYNOVIAL CYST  . MENISCUS REPAIR Right   . POLYPECTOMY    . PROSTATE BIOPSY  1999  . PROSTATECTOMY  04/25/2006    Social History: He is married.  He has 1 son and 1 daughter and 2 stepchildren.  He is a Optometrist.  He smoked cigarettes 1ppd x 10 years, quit smoking 50 years ago. He drinks 2 alcoholic beverages weekly.  No drug use.  Family History: Mother with history of heart disease, hypertension and arthritis.  Father with history of lung cancer and emphysema.  Son with history of atrial fibrillation.  Paternal grandmother with history of liver cancer.  No family history of esophageal, gastric or colorectal cancer.  No Known Allergies    Outpatient Encounter Medications as of 03/16/2020  Medication Sig  . acetaminophen (TYLENOL) 500 MG tablet Take 500 mg by mouth at bedtime.   Marland Kitchen apixaban (ELIQUIS) 5 MG TABS tablet Take 5 mg by mouth 2 (two) times daily.   Marland Kitchen atorvastatin (LIPITOR) 40 MG tablet Take 20 mg by mouth daily.   . cholecalciferol (VITAMIN D) 1000 UNITS tablet Take 1,000 Units by mouth daily.  . diclofenac sodium (VOLTAREN) 1 % GEL Apply 2 g topically in the morning and at bedtime.   Marland Kitchen diltiazem (CARDIZEM) 30 MG tablet Take 1 tablet every 4 hours AS NEEDED for afib rapid heart rate over 100  . fluticasone (FLONASE) 50 MCG/ACT nasal spray Place 1  spray into both nostrils at bedtime.   . Glucosamine-Chondroit-Vit C-Mn (GLUCOSAMINE 1500 COMPLEX PO) Take 1,500 mg by mouth in the morning.   Marland Kitchen HYDROcodone-acetaminophen (NORCO/VICODIN) 5-325 MG tablet Take 0.5 tablets by mouth as needed for moderate pain (When traveling).   Marland Kitchen L-Lysine 500 MG TABS Take 500 mg by mouth in the morning.   Marland Kitchen LORazepam (ATIVAN) 1 MG tablet Take 1 mg by mouth at bedtime as needed for sleep (When traveling).   . methocarbamol (ROBAXIN) 500 MG tablet Take 1 tablet (500 mg total) by mouth every 6 (six) hours as needed for muscle spasms.  . Mometasone Furoate 100 MCG/ACT AERO Inhale 1 puff into the lungs at bedtime.   . montelukast (SINGULAIR) 10 MG tablet Take 10 mg by mouth at bedtime.  . Multiple Vitamin (MULTIVITAMIN) capsule Take 1 capsule by mouth daily.  . Naphazoline-Pheniramine (OPCON-A) 0.027-0.315 % SOLN Place 1 drop into both eyes daily as needed (allergies).   Marland Kitchen omeprazole (PRILOSEC) 20 MG capsule Take 20 mg by mouth 2 (two) times daily before a meal.   . OVER THE COUNTER MEDICATION Apply 1 application topically at bedtime. CBD lotion on joints  . predniSONE (DELTASONE) 10 MG tablet Take 10-20 mg by mouth daily as needed (Asthma).  Marland Kitchen PRESCRIPTION MEDICATION Inject 1 application into the skin every 14 (fourteen) days. Allergy injections  . Probiotic Product (ALIGN) 4 MG CAPS Take 4 mg by mouth daily.   . psyllium (METAMUCIL) 58.6 % powder Take 2 packets by mouth 2 (two) times daily.   . VENTOLIN HFA 108 (90 Base) MCG/ACT inhaler Inhale 1-2 puffs into the lungs as needed for wheezing or shortness of breath.   . vitamin C (ASCORBIC ACID) 500 MG tablet Take 500 mg by mouth daily.  Marland Kitchen zolpidem (AMBIEN) 10 MG tablet Take 10 mg by mouth at bedtime  as needed for sleep.   Marland Kitchen EPINEPHrine 0.3 mg/0.3 mL IJ SOAJ injection Inject 0.3 mg into the muscle as needed for anaphylaxis.  (Patient not taking: Reported on 03/16/2020)   No facility-administered encounter medications  on file as of 03/16/2020.    REVIEW OF SYSTEMS: Gen: Denies fever, sweats or chills. No weight loss.  CV: + LE edema. Denies chest pain or palpitations.  Resp: Denies cough, shortness of breath of hemoptysis.  GI: Denies heartburn, dysphagia, stomach or lower abdominal pain. No diarrhea or constipation.  GU : Denies urinary burning, blood in urine, increased urinary frequency or incontinence. MS: + arthritis, left shoulder pain.  Derm: Denies rash, itchiness, skin lesions or unhealing ulcers. Psych: Denies depression, anxiety, memory loss, suicidal ideation and confusion. Heme: Denies bruising, bleeding. Neuro:  + left leg nerve pain. Endo:  Denies any problems with DM, thyroid or adrenal function.   PHYSICAL EXAM: BP 118/74 (BP Location: Left Arm, Patient Position: Sitting, Cuff Size: Normal)   Pulse 72   Ht 5' 8.25" (1.734 m) Comment: height measured without shoes  Wt 210 lb (95.3 kg)   BMI 31.70 kg/m  General: Well developed 74 year old male in no acute distress. Head: Normocephalic and atraumatic. Eyes:  Sclerae non-icteric, conjunctive pink. Ears: Normal auditory acuity. Mouth: Dentition intact. No ulcers or lesions.  Neck: Supple, no lymphadenopathy or thyromegaly.  Lungs: Clear bilaterally to auscultation without wheezes, crackles or rhonchi. Heart: Regular rate and rhythm. No murmur, rub or gallop appreciated.  Abdomen: Soft, nontender, non distended. No masses. No hepatosplenomegaly. Normoactive bowel sounds x 4 quadrants.  Rectal: No external hemorrhoids. Anal hemorrhoids mildly inflamed. No stool or blood in the rectal vault. No mass. CMA Melissa present during exam.  Musculoskeletal: Symmetrical with no gross deformities. Skin: Warm and dry. No rash or lesions on visible extremities. Extremities: No significant edema. Neurological: Alert oriented x 4, no focal deficits.  Psychological:  Alert and cooperative. Normal mood and affect.  ASSESSMENT AND PLAN:  50.   74 year old male with a history of prostate cancer s/p prostatectomy 2008 with recurrence 2015 s/p radiation presents for further evaluation regarding a change in bowel pattern consisting of stringy narrow stools, occasional mucus per the rectum and infrequent bright red rectal bleeding.  Numerous colonoscopies in the past which showed diverticulosis without evidence of colon polyps.  His most recent colonoscopy was in 2013 which showed left-sided diverticulosis. -Colonoscopy to assess for radiation proctitis, rule our colorectal malignancy . Colonoscopy benefits and risks discussed including risk with sedation, risk of bleeding, perforation and infection  -Our office will contact Dr. Johney Frame to verify Eliquis instructions prior to proceeding with a colonoscopy -Further follow-up to be determined after colonoscopy complete  s/p prostatectomy 04/2006 with recurrence in 2015 s/p radiation PSA. 5.2 -> 4.21 -> 4.71,  2.  Internal hemorrhoid -Apply a small amount of Desitin inside the anal area 3 times daily as needed for anal irritation and hemorrhoidal bleeding  3.  History of atrial fibrillation on Eliquis. CHADS2vasc score is 3.    4.  GERD, stable on Omeprazole 20 mg twice daily for more than 10 years -I discussed scheduling an EGD at the time of his colonoscopy to rule out Barrett's esophagus.  I will onsult with Dr. Orvan Falconer to further verify if an EGD warranted at this time.  5.  History of DVT         CC:  Jarome Matin, MD

## 2020-03-16 NOTE — Telephone Encounter (Signed)
Patient with diagnosis of afib on Eliquis for anticoagulation.    Procedure: Colonoscopy Date of procedure: 04/16/20    CHA2DS2-VASc Score = 3  This indicates a 3.2% annual risk of stroke. The patient's score is based upon: CHF History: No HTN History: No Diabetes History: No Stroke History: No Vascular Disease History: Yes Age Score: 2 Gender Score: 0      CrCl 55 ml/min  Per office protocol, patient can hold Eliquis for 2 days prior to procedure.

## 2020-03-23 ENCOUNTER — Telehealth: Payer: Self-pay

## 2020-03-23 ENCOUNTER — Other Ambulatory Visit: Payer: Self-pay | Admitting: Gastroenterology

## 2020-03-23 DIAGNOSIS — Z1211 Encounter for screening for malignant neoplasm of colon: Secondary | ICD-10-CM

## 2020-03-23 DIAGNOSIS — K219 Gastro-esophageal reflux disease without esophagitis: Secondary | ICD-10-CM

## 2020-03-23 NOTE — Progress Notes (Signed)
Brandon Simmons, can you contact this very nice patient and let him know Dr. Orvan Falconer did review his office consult 03/16/2020. She recommended an EGD at the time of his colonoscopy to rule out Barrett's esophagus. Please verify with the patient if he agrees to have an EGD at the time of his colonoscopy. If he agrees, please add the EGD to his colonoscopy date. Note, I discussed the EGD with him at the time of his office appointment and he knew our office would contact him with Dr. Daleen Bo recommendations. Thank you.

## 2020-03-23 NOTE — Progress Notes (Addendum)
Spoke to patient to discuss adding an EGD to his scheduled colonoscopy per Dr Orvan Falconer recommendations. Patient has agreed to proceed with adding the EGD on. Orders placed and new bowel prep instructions sent to patient. The procedure has been moved to 04/27/20 as there was not an hour spot available form his previous appointment. Patient has received clearance for Cardiology to hold Eliquis 2 days prior to procedure,and voiced understanding.

## 2020-03-23 NOTE — Telephone Encounter (Signed)
error 

## 2020-03-24 DIAGNOSIS — M545 Low back pain, unspecified: Secondary | ICD-10-CM | POA: Diagnosis not present

## 2020-03-24 DIAGNOSIS — M5416 Radiculopathy, lumbar region: Secondary | ICD-10-CM | POA: Diagnosis not present

## 2020-03-24 DIAGNOSIS — M7542 Impingement syndrome of left shoulder: Secondary | ICD-10-CM | POA: Diagnosis not present

## 2020-03-24 DIAGNOSIS — M19012 Primary osteoarthritis, left shoulder: Secondary | ICD-10-CM | POA: Diagnosis not present

## 2020-03-26 DIAGNOSIS — J3089 Other allergic rhinitis: Secondary | ICD-10-CM | POA: Diagnosis not present

## 2020-03-26 DIAGNOSIS — J301 Allergic rhinitis due to pollen: Secondary | ICD-10-CM | POA: Diagnosis not present

## 2020-03-29 DIAGNOSIS — J301 Allergic rhinitis due to pollen: Secondary | ICD-10-CM | POA: Diagnosis not present

## 2020-03-29 DIAGNOSIS — J3089 Other allergic rhinitis: Secondary | ICD-10-CM | POA: Diagnosis not present

## 2020-03-31 DIAGNOSIS — J301 Allergic rhinitis due to pollen: Secondary | ICD-10-CM | POA: Diagnosis not present

## 2020-03-31 DIAGNOSIS — J3089 Other allergic rhinitis: Secondary | ICD-10-CM | POA: Diagnosis not present

## 2020-04-06 DIAGNOSIS — R030 Elevated blood-pressure reading, without diagnosis of hypertension: Secondary | ICD-10-CM | POA: Diagnosis not present

## 2020-04-06 DIAGNOSIS — M4316 Spondylolisthesis, lumbar region: Secondary | ICD-10-CM | POA: Diagnosis not present

## 2020-04-06 DIAGNOSIS — M5416 Radiculopathy, lumbar region: Secondary | ICD-10-CM | POA: Diagnosis not present

## 2020-04-06 DIAGNOSIS — J3089 Other allergic rhinitis: Secondary | ICD-10-CM | POA: Diagnosis not present

## 2020-04-06 DIAGNOSIS — M48061 Spinal stenosis, lumbar region without neurogenic claudication: Secondary | ICD-10-CM | POA: Diagnosis not present

## 2020-04-06 DIAGNOSIS — Z6830 Body mass index (BMI) 30.0-30.9, adult: Secondary | ICD-10-CM | POA: Diagnosis not present

## 2020-04-06 DIAGNOSIS — M545 Low back pain, unspecified: Secondary | ICD-10-CM | POA: Diagnosis not present

## 2020-04-06 DIAGNOSIS — J301 Allergic rhinitis due to pollen: Secondary | ICD-10-CM | POA: Diagnosis not present

## 2020-04-08 DIAGNOSIS — J301 Allergic rhinitis due to pollen: Secondary | ICD-10-CM | POA: Diagnosis not present

## 2020-04-08 DIAGNOSIS — J3089 Other allergic rhinitis: Secondary | ICD-10-CM | POA: Diagnosis not present

## 2020-04-12 DIAGNOSIS — J3089 Other allergic rhinitis: Secondary | ICD-10-CM | POA: Diagnosis not present

## 2020-04-12 DIAGNOSIS — J4530 Mild persistent asthma, uncomplicated: Secondary | ICD-10-CM | POA: Diagnosis not present

## 2020-04-12 DIAGNOSIS — J301 Allergic rhinitis due to pollen: Secondary | ICD-10-CM | POA: Diagnosis not present

## 2020-04-13 DIAGNOSIS — M25512 Pain in left shoulder: Secondary | ICD-10-CM | POA: Diagnosis not present

## 2020-04-16 ENCOUNTER — Encounter: Payer: Medicare Other | Admitting: Gastroenterology

## 2020-04-20 ENCOUNTER — Encounter: Payer: Medicare Other | Admitting: Gastroenterology

## 2020-04-22 DIAGNOSIS — J3089 Other allergic rhinitis: Secondary | ICD-10-CM | POA: Diagnosis not present

## 2020-04-22 DIAGNOSIS — J301 Allergic rhinitis due to pollen: Secondary | ICD-10-CM | POA: Diagnosis not present

## 2020-04-23 DIAGNOSIS — M19012 Primary osteoarthritis, left shoulder: Secondary | ICD-10-CM | POA: Diagnosis not present

## 2020-04-26 ENCOUNTER — Telehealth: Payer: Self-pay | Admitting: *Deleted

## 2020-04-26 NOTE — Telephone Encounter (Signed)
Patient with diagnosis of atrial fibrillation on Eliquis for anticoagulation.    Procedure: left reverse shoulder arthroplasty Date of procedure: TBD   CHA2DS2-VASc Score = 3  This indicates a 3.2% annual risk of stroke. The patient's score is based upon: CHF History: No HTN History: No Diabetes History: No Stroke History: No Vascular Disease History: Yes Age Score: 2 Gender Score: 0  CrCl 76.8 Platelet count 163  Per office protocol, patient can hold Eliquis for 3 days prior to procedure.    Patient will not need bridging with Lovenox (enoxaparin) around procedure.

## 2020-04-26 NOTE — Telephone Encounter (Signed)
Left VM

## 2020-04-26 NOTE — Telephone Encounter (Signed)
   [facility] Pre-operative Risk Assessment    HEARTCARE STAFF: - Please ensure there is not already an duplicate clearance open for this procedure. - Under Visit Info/Reason for Call, type in Other and utilize the format Clearance MM/DD/YY or Clearance TBD. Do not use dashes or single digits. - If request is for dental extraction, please clarify the # of teeth to be extracted.  Request for surgical clearance:  1. What type of surgery is being performed? LEFT REVERSE SHOULDER ARTHROPLASTY   2. When is this surgery scheduled? TBD   3. What type of clearance is required (medical clearance vs. Pharmacy clearance to hold med vs. Both)? BOTH  4. Are there any medications that need to be held prior to surgery and how long? ELIQUIS   5. Practice name and name of physician performing surgery?  [facility]; DR. Lennette Bihari SUPPLE   6. What is the office phone number? [phone]   7.   What is the office fax number? [phone] ATTN: [place]  8.   Anesthesia type (None, local, MAC, general) ? GENERAL   Brandon Simmons 04/26/2020, 12:28 PM  _________________________________________________________________   (provider comments below)

## 2020-04-27 ENCOUNTER — Encounter: Payer: Self-pay | Admitting: Gastroenterology

## 2020-04-27 ENCOUNTER — Other Ambulatory Visit: Payer: Self-pay

## 2020-04-27 ENCOUNTER — Ambulatory Visit (AMBULATORY_SURGERY_CENTER): Payer: Medicare Other | Admitting: Gastroenterology

## 2020-04-27 ENCOUNTER — Encounter: Payer: Medicare Other | Admitting: Gastroenterology

## 2020-04-27 VITALS — BP 118/67 | HR 62 | Temp 96.9°F | Resp 13 | Ht 68.0 in | Wt 210.0 lb

## 2020-04-27 DIAGNOSIS — K219 Gastro-esophageal reflux disease without esophagitis: Secondary | ICD-10-CM

## 2020-04-27 DIAGNOSIS — D123 Benign neoplasm of transverse colon: Secondary | ICD-10-CM

## 2020-04-27 DIAGNOSIS — D131 Benign neoplasm of stomach: Secondary | ICD-10-CM | POA: Diagnosis not present

## 2020-04-27 DIAGNOSIS — K635 Polyp of colon: Secondary | ICD-10-CM | POA: Diagnosis not present

## 2020-04-27 DIAGNOSIS — K648 Other hemorrhoids: Secondary | ICD-10-CM | POA: Diagnosis not present

## 2020-04-27 DIAGNOSIS — D124 Benign neoplasm of descending colon: Secondary | ICD-10-CM

## 2020-04-27 DIAGNOSIS — K449 Diaphragmatic hernia without obstruction or gangrene: Secondary | ICD-10-CM

## 2020-04-27 DIAGNOSIS — Z1211 Encounter for screening for malignant neoplasm of colon: Secondary | ICD-10-CM

## 2020-04-27 DIAGNOSIS — R194 Change in bowel habit: Secondary | ICD-10-CM | POA: Diagnosis not present

## 2020-04-27 DIAGNOSIS — K5730 Diverticulosis of large intestine without perforation or abscess without bleeding: Secondary | ICD-10-CM | POA: Diagnosis not present

## 2020-04-27 DIAGNOSIS — K317 Polyp of stomach and duodenum: Secondary | ICD-10-CM | POA: Diagnosis not present

## 2020-04-27 MED ORDER — SODIUM CHLORIDE 0.9 % IV SOLN: 500.0000 mL | Freq: Once | INTRAVENOUS | Status: DC

## 2020-04-27 NOTE — Op Note (Signed)
[facility] Patient Name: Brandon Simmons Procedure Date: 04/27/2020 2:07 PM MRN: [id number] Endoscopist: Thornton Park MD, MD Age: 75 Referring MD:  Date of Birth: [birth year] Gender: Male Account #: [id number] Procedure:                Upper GI endoscopy Indications:              Follow-up of gastro-esophageal reflux disease Medicines:                Monitored Anesthesia Care Procedure:                Pre-Anesthesia Assessment:                           - Prior to the procedure, a History and Physical                            was performed, and patient medications and                            allergies were reviewed. The patient's tolerance of                            previous anesthesia was also reviewed. The risks                            and benefits of the procedure and the sedation                            options and risks were discussed with the patient.                            All questions were answered, and informed consent                            was obtained. Prior Anticoagulants: The patient has                            taken Eliquis (apixaban), last dose was 2 days                            prior to procedure. ASA Grade Assessment: III - A                            patient with severe systemic disease. After                            reviewing the risks and benefits, the patient was                            deemed in satisfactory condition to undergo the                            procedure.  After obtaining informed consent, the endoscope was                            passed under direct vision. Throughout the                            procedure, the patient's blood pressure, pulse, and                            oxygen saturations were monitored continuously. The                            Endoscope was introduced through the mouth, and                            advanced to the third part of duodenum. The  upper                            GI endoscopy was accomplished without difficulty.                            The patient tolerated the procedure well. Scope In: Scope Out: Findings:                 The examined esophagus was normal. Biopsies were                            taken from the distal esophagus with a cold forceps                            for histology. Estimated blood loss was minimal.                           A small hiatal hernia is present. Multiple sessile                            polyps with no bleeding were found in the gastric                            fundus and in the gastric body. Biopsies were taken                            in two separate containers with a cold forceps for                            histology. Estimated blood loss was minimal.                           The examined duodenum was normal. Complications:            No immediate complications. Estimated blood loss:                            Minimal. Estimated Blood Loss:  Estimated blood loss was minimal. Impression:               - Normal esophagus. Biopsied.                           - Small hiatal hernia.                           - Multiple gastric polyps. Likely fundic gland                            polyps. Biopsied.                           - Normal examined duodenum. Recommendation:           - Patient has a contact number available for                            emergencies. The signs and symptoms of potential                            delayed complications were discussed with the                            patient. Return to normal activities tomorrow.                            Written discharge instructions were provided to the                            patient.                           - Resume previous diet.                           - Continue present medications.                           - Await pathology results.                           - Proceed with colonoscopy  this afternoon as                            previously planned. Thornton Park MD, MD 04/27/2020 2:55:47 PM This report has been signed electronically.

## 2020-04-27 NOTE — Progress Notes (Signed)
SB Vitals and AG IV.

## 2020-04-27 NOTE — Telephone Encounter (Signed)
Patient returning Angie's call. Patient has another appointment at 12:00 today and will not be able to talk after that .

## 2020-04-27 NOTE — Progress Notes (Signed)
pt tolerated well. VSS. awake and to recovery. Report given to RN. Bite block used without trauma.

## 2020-04-27 NOTE — Progress Notes (Signed)
Called to room to assist during endoscopic procedure.  Patient ID and intended procedure confirmed with present staff. Received instructions for my participation in the procedure from the performing physician.  

## 2020-04-27 NOTE — Op Note (Signed)
[facility] Patient Name: Brandon Simmons Procedure Date: 04/27/2020 2:07 PM MRN: [id number] Endoscopist: Thornton Park MD, MD Age: 75 Referring MD:  Date of Birth: [birth year] Gender: Male Account #: [id number] Procedure:                Colonoscopy Indications:              Change in bowel habits                           No polyps on colonoscopy 2003 or 2013 Medicines:                Monitored Anesthesia Care Procedure:                Pre-Anesthesia Assessment:                           - Prior to the procedure, a History and Physical                            was performed, and patient medications and                            allergies were reviewed. The patient's tolerance of                            previous anesthesia was also reviewed. The risks                            and benefits of the procedure and the sedation                            options and risks were discussed with the patient.                            All questions were answered, and informed consent                            was obtained. Prior Anticoagulants: The patient has                            taken Eliquis (apixaban), last dose was 2 days                            prior to procedure. ASA Grade Assessment: III - A                            patient with severe systemic disease. After                            reviewing the risks and benefits, the patient was                            deemed in satisfactory condition to undergo the  procedure.                           After obtaining informed consent, the colonoscope                            was passed under direct vision. Throughout the                            procedure, the patient's blood pressure, pulse, and                            oxygen saturations were monitored continuously. The                            Olympus CF-HQ190L [id number]) Colonoscope was                            introduced  through the anus and advanced to the 3                            cm into the ileum. The colonoscopy was performed                            without difficulty. The patient tolerated the                            procedure well. The quality of the bowel                            preparation was good. The terminal ileum, ileocecal                            valve, appendiceal orifice, and rectum were                            photographed. Scope In: 2:27:38 PM Scope Out: 2:46:09 PM Scope Withdrawal Time: 0 hours 14 minutes 31 seconds  Total Procedure Duration: 0 hours 18 minutes 31 seconds  Findings:                 Non-bleeding external and internal hemorrhoids were                            found.                           Multiple small and large-mouthed diverticula were                            found in the sigmoid colon and distal descending                            colon.  A 2 mm polyp was found in the descending colon. The                            polyp was sessile. The polyp was removed with a                            cold snare. Resection and retrieval were complete.                            Estimated blood loss was minimal.                           A less than 1 mm polyp was found in the transverse                            colon. The polyp was flat. The polyp was removed                            with a cold biopsy forceps. Resection and retrieval                            were complete. Estimated blood loss was minimal.                           The exam was otherwise without abnormality on                            direct and retroflexion views. No evidence for                            radiation proctitis. Complications:            No immediate complications. Estimated blood loss:                            Minimal. Estimated Blood Loss:     Estimated blood loss was minimal. Impression:               - Non-bleeding external and  internal hemorrhoids.                           - Diverticulosis in the sigmoid colon and in the                            distal descending colon.                           - One 2 mm polyp in the descending colon, removed                            with a cold snare. Resected and retrieved.                           - One less than 1 mm polyp in  the transverse colon,                            removed with a cold biopsy forceps. Resected and                            retrieved.                           - The examination was otherwise normal on direct                            and retroflexion views. Recommendation:           - Patient has a contact number available for                            emergencies. The signs and symptoms of potential                            delayed complications were discussed with the                            patient. Return to normal activities tomorrow.                            Written discharge instructions were provided to the                            patient.                           - Resume previous diet.                           - Continue present medications.                           - Consider using a daily stool bulking agent such                            as Metamucil or Benefiber.                           - Resume Eliquis tomorrow.                           - Await pathology results.                           - Follow a high fiber diet. Drink at least 64                            ounces of water daily. Add a daily stool bulking                            agent such as psyllium (  an exampled would be                            Metamucil).                           - Emerging evidence supports eating a diet of                            fruits, vegetables, grains, calcium, and yogurt                            while reducing red meat and alcohol may reduce the                            risk of colon cancer.                            - Thank you for allowing me to be involved in your                            colon cancer prevention. Thornton Park MD, MD 04/27/2020 3:01:01 PM This report has been signed electronically.

## 2020-04-27 NOTE — Patient Instructions (Addendum)
YOU HAD AN ENDOSCOPIC PROCEDURE TODAY AT [facility]:   Refer to the procedure report that was given to you for any specific questions about what was found during the examination.  If the procedure report does not answer your questions, please call your gastroenterologist to clarify.  If you requested that your care partner not be given the details of your procedure findings, then the procedure report has been included in a sealed envelope for you to review at your convenience later.  YOU SHOULD EXPECT: Some feelings of bloating in the abdomen. Passage of more gas than usual.  Walking can help get rid of the air that was put into your GI tract during the procedure and reduce the bloating. If you had a lower endoscopy (such as a colonoscopy or flexible sigmoidoscopy) you may notice spotting of blood in your stool or on the toilet paper. If you underwent a bowel prep for your procedure, you may not have a normal bowel movement for a few days.  Please Note:  You might notice some irritation and congestion in your nose or some drainage.  This is from the oxygen used during your procedure.  There is no need for concern and it should clear up in a day or so.  SYMPTOMS TO REPORT IMMEDIATELY:   Following lower endoscopy (colonoscopy or flexible sigmoidoscopy):  Excessive amounts of blood in the stool  Significant tenderness or worsening of abdominal pains  Swelling of the abdomen that is new, acute  Fever of 100F or higher   Following upper endoscopy (EGD)  Vomiting of blood or coffee ground material  New chest pain or pain under the shoulder blades  Painful or persistently difficult swallowing  New shortness of breath  Fever of 100F or higher  Black, tarry-looking stools  For urgent or emergent issues, a gastroenterologist can be reached at any hour by calling [phone]. Do not use MyChart messaging for urgent concerns.    DIET:  We do recommend a small meal at first, but  then you may proceed to your regular diet.  Drink plenty of fluids but you should avoid alcoholic beverages for 24 hours. Follow a fiber diet.  MEDICATIONS: Continue present medications. Resume Eliquis at prior dose tomorrow.  Please see handouts given to you by your recovery nurse: Polyp sheet, High Fiber Diet, Diverticulosis, GERD/Hiatal Hernia.  Thank you for allowing [place] to provide for your healthcare needs today.  ACTIVITY:  You should plan to take it easy for the rest of today and you should NOT DRIVE or use heavy machinery until tomorrow (because of the sedation medicines used during the test).    FOLLOW UP: Our staff will call the number listed on your records 48-72 hours following your procedure to check on you and address any questions or concerns that you may have regarding the information given to you following your procedure. If we do not reach you, we will leave a message.  We will attempt to reach you two times.  During this call, we will ask if you have developed any symptoms of COVID 19. If you develop any symptoms (ie: fever, flu-like symptoms, shortness of breath, cough etc.) before then, please call [phone].  If you test positive for Covid 19 in the 2 weeks post procedure, please call and report this information to [place].    If any biopsies were taken you will be contacted by phone or by letter within the next 1-3 weeks.  Please call us at [phone] if you have not heard about the biopsies in 3 weeks.    SIGNATURES/CONFIDENTIALITY: You and/or your care partner have signed paperwork which will be entered into your electronic medical record.  These signatures attest to the fact that that the information above on your After Visit Summary has been reviewed and is understood.  Full responsibility of the confidentiality of this discharge information lies with you and/or your care-partner.

## 2020-04-29 ENCOUNTER — Telehealth: Payer: Self-pay

## 2020-04-29 NOTE — Telephone Encounter (Signed)
    Pt returning Dayna's call

## 2020-04-29 NOTE — Telephone Encounter (Signed)
  Follow up Call-  Call back number 04/27/2020  Post procedure Call Back phone  # [phone]  Permission to leave phone message Yes  Some recent data might be hidden     Left message

## 2020-04-29 NOTE — Telephone Encounter (Signed)
   Primary Cardiologist: Minus Breeding, MD  Chart reviewed as part of pre-operative protocol coverage. Patient was contacted 04/29/2020 in reference to pre-operative risk assessment for pending surgery as outlined below.  Declin Oren Section. was last seen on 02/09/20 by Dr. Rayann Heman - h/o outlined with aortic atherosclerosis and ectatic thoracic aorta (mild by CT 10/2019), diverticulosis, ED, PAF, prostate CA, nonobstructive CAD 2016.  Tried to call pt but got VM. LMTCB.   Charlie Pitter, PA-C 04/29/2020, 10:42 AM

## 2020-04-29 NOTE — Telephone Encounter (Signed)
Follow up to pt, no answer

## 2020-04-29 NOTE — Telephone Encounter (Signed)
   Primary Cardiologist: Minus Breeding, MD  Chart revisited as part of pre-operative protocol coverage. Called patient back. He clarified that his surgery is now not until late May 2022. He has an appointment with Dr. Rayann Heman on 06/09/20 at which time pre-op clearance can be reassessed since surgical date is > 3 months out from now. I added preop clearance modifier to appointment notes. Per office protocol, the provider should assess clearance at time of office visit and should forward their finalized clearance decision to requesting party below. I will route an update to requesting surgeon that appt is pending and remove this message from the pre-op box.  The patient did want to make Dr. Rayann Heman aware that he had a 4 hour episode of atrial fib on 04/21/20 for which he took 2 diltiazem with resolution. He is s/p ablation. Otherwise doing clinically well without recurrence since. Will route to Dr. Rayann Heman so he is aware but told patient our office would only contact him if there were any recommended changes. Otherwise he'll continue to monitor at home and keep f/u as above.  Charlie Pitter, PA-C 04/29/2020, 3:43 PM

## 2020-05-10 ENCOUNTER — Encounter: Payer: Self-pay | Admitting: Gastroenterology

## 2020-05-11 DIAGNOSIS — J3089 Other allergic rhinitis: Secondary | ICD-10-CM | POA: Diagnosis not present

## 2020-05-11 DIAGNOSIS — J301 Allergic rhinitis due to pollen: Secondary | ICD-10-CM | POA: Diagnosis not present

## 2020-05-14 ENCOUNTER — Other Ambulatory Visit (HOSPITAL_COMMUNITY): Payer: Self-pay | Admitting: *Deleted

## 2020-05-14 DIAGNOSIS — I480 Paroxysmal atrial fibrillation: Secondary | ICD-10-CM

## 2020-05-14 MED ORDER — DILTIAZEM HCL 30 MG PO TABS
ORAL_TABLET | ORAL | 1 refills | Status: DC
Start: 2020-05-14 — End: 2020-05-17

## 2020-05-15 ENCOUNTER — Other Ambulatory Visit (HOSPITAL_COMMUNITY): Payer: Self-pay | Admitting: Nurse Practitioner

## 2020-05-15 DIAGNOSIS — I480 Paroxysmal atrial fibrillation: Secondary | ICD-10-CM

## 2020-05-26 ENCOUNTER — Other Ambulatory Visit (HOSPITAL_COMMUNITY): Payer: Self-pay | Admitting: Nurse Practitioner

## 2020-05-26 DIAGNOSIS — I480 Paroxysmal atrial fibrillation: Secondary | ICD-10-CM

## 2020-05-27 DIAGNOSIS — J3089 Other allergic rhinitis: Secondary | ICD-10-CM | POA: Diagnosis not present

## 2020-05-27 DIAGNOSIS — J301 Allergic rhinitis due to pollen: Secondary | ICD-10-CM | POA: Diagnosis not present

## 2020-05-31 DIAGNOSIS — R82998 Other abnormal findings in urine: Secondary | ICD-10-CM | POA: Diagnosis not present

## 2020-05-31 DIAGNOSIS — E785 Hyperlipidemia, unspecified: Secondary | ICD-10-CM | POA: Diagnosis not present

## 2020-06-04 DIAGNOSIS — R972 Elevated prostate specific antigen [PSA]: Secondary | ICD-10-CM | POA: Diagnosis not present

## 2020-06-07 ENCOUNTER — Ambulatory Visit: Payer: Medicare Other | Admitting: Internal Medicine

## 2020-06-07 DIAGNOSIS — R059 Cough, unspecified: Secondary | ICD-10-CM | POA: Diagnosis not present

## 2020-06-07 DIAGNOSIS — C61 Malignant neoplasm of prostate: Secondary | ICD-10-CM | POA: Diagnosis not present

## 2020-06-07 DIAGNOSIS — Z1331 Encounter for screening for depression: Secondary | ICD-10-CM | POA: Diagnosis not present

## 2020-06-07 DIAGNOSIS — F5101 Primary insomnia: Secondary | ICD-10-CM | POA: Diagnosis not present

## 2020-06-07 DIAGNOSIS — Z0000 Encounter for general adult medical examination without abnormal findings: Secondary | ICD-10-CM | POA: Diagnosis not present

## 2020-06-07 DIAGNOSIS — E669 Obesity, unspecified: Secondary | ICD-10-CM | POA: Diagnosis not present

## 2020-06-07 DIAGNOSIS — I4891 Unspecified atrial fibrillation: Secondary | ICD-10-CM | POA: Diagnosis not present

## 2020-06-07 DIAGNOSIS — Z1339 Encounter for screening examination for other mental health and behavioral disorders: Secondary | ICD-10-CM | POA: Diagnosis not present

## 2020-06-07 DIAGNOSIS — J45909 Unspecified asthma, uncomplicated: Secondary | ICD-10-CM | POA: Diagnosis not present

## 2020-06-07 DIAGNOSIS — I2510 Atherosclerotic heart disease of native coronary artery without angina pectoris: Secondary | ICD-10-CM | POA: Diagnosis not present

## 2020-06-07 DIAGNOSIS — E785 Hyperlipidemia, unspecified: Secondary | ICD-10-CM | POA: Diagnosis not present

## 2020-06-09 ENCOUNTER — Other Ambulatory Visit (HOSPITAL_COMMUNITY): Payer: Self-pay | Admitting: Nurse Practitioner

## 2020-06-09 ENCOUNTER — Ambulatory Visit (INDEPENDENT_AMBULATORY_CARE_PROVIDER_SITE_OTHER): Payer: Medicare Other | Admitting: Internal Medicine

## 2020-06-09 ENCOUNTER — Encounter: Payer: Self-pay | Admitting: Internal Medicine

## 2020-06-09 ENCOUNTER — Other Ambulatory Visit: Payer: Self-pay

## 2020-06-09 VITALS — BP 118/72 | HR 65 | Ht 69.75 in | Wt 212.6 lb

## 2020-06-09 DIAGNOSIS — I480 Paroxysmal atrial fibrillation: Secondary | ICD-10-CM

## 2020-06-09 NOTE — Patient Instructions (Addendum)
Medication Instructions:  Your physician recommends that you continue on your current medications as directed. Please refer to the Current Medication list given to you today.  Labwork: None ordered.  Testing/Procedures: None ordered.  Follow-Up: Your physician wants you to follow-up in: 06/13/20 at 2 pm with Dr. Rayann Heman. 6 months with the [facility] Clinic they will contact you to schedule.    Any Other Special Instructions Will Be Listed Below (If Applicable).  If you need a refill on your cardiac medications before your next appointment, please call your pharmacy.

## 2020-06-09 NOTE — Progress Notes (Signed)
PCP: Leanna Battles, MD   Primary EP: Dr Laney Pastor. is a 75 y.o. male who presents today for routine electrophysiology followup.  Since last being seen in our clinic, the patient reports doing very well.  He exercises regularly without issues.  No ischemic symptoms  Rare afib, typically short lived.  Today, he denies symptoms of palpitations, chest pain, shortness of breath,  lower extremity edema, dizziness, presyncope, or syncope.  The patient is otherwise without complaint today.   Past Medical History:  Diagnosis Date  . Aortic atherosclerosis ([place])   . Arthritis   . Asthma   . Diverticulitis   . Diverticulosis   . DVT (deep venous thrombosis) ([place])    right  . Ectatic thoracic aorta ([place])    a. 4.3cm by CT 03/2018.  Marland Kitchen GERD (gastroesophageal reflux disease)   . Hemorrhoids   . History of echocardiogram    a. Echo 10/16 [facility]):  EF 55%, trace MR, mild TR, mild to mod AI, mild dilated Ao root (41 mm)  . Hyperlipemia   . Organic erectile dysfunction   . PAF (paroxysmal atrial fibrillation) ([place])    a. admx to [facility] in [place], [place] 10/16 with AF with RVR >> converted to NSR with IV Dilt;  b. Flecainide started >> ETT neg for pro-arrhythmia   . Prostate cancer ([place]) 04/25/2006   Gleason 3+4=7  . Prostatitis   . S/P cardiac cath    a. LHC at [facility] 10/16:  LM ok, mLAD 30%, LCx ok, dRCA 20%  . S/P radiation therapy 01/19/2014 through 03/05/2014                                                      Prostate bed 6600 cGy in 33 sessions                          . Torn rotator cuff    right  40% tear   Past Surgical History:  Procedure Laterality Date  . ABLATION OF DYSRHYTHMIC FOCUS  03/28/2018  . APPENDECTOMY  1967  . ATRIAL FIBRILLATION ABLATION N/A 03/28/2018   Procedure: ATRIAL FIBRILLATION ABLATION;  Surgeon: [name], MD;  Location: [facility];  Service: Cardiovascular;  Laterality: N/A;  . ATRIAL FIBRILLATION  ABLATION N/A 11/11/2019   Procedure: ATRIAL FIBRILLATION ABLATION;  Surgeon: [name], MD;  Location: [facility];  Service: Cardiovascular;  Laterality: N/A;  . CATARACT EXTRACTION W/ INTRAOCULAR LENS IMPLANT Left 11/2011  . CATARACT EXTRACTION W/ INTRAOCULAR LENS IMPLANT Right 09/2015  . COLONOSCOPY    . LARYNX SURGERY  2000   vocal cord lesion- benign  . LUMBAR LAMINECTOMY/DECOMPRESSION MICRODISCECTOMY Right 07/26/2017   Procedure: RIGHT LUMBAR 3- LUMBAR 4 FORAMINOTOMY WITH RESECTION OF SYNOVIAL CYST;  Surgeon: Erline Levine, MD;  Location: [facility];  Service: Neurosurgery;  Laterality: Right;  RIGHT LUMBAR 3- LUMBAR 4 FORAMINOTOMY WITH RESECTION OF SYNOVIAL CYST  . MENISCUS REPAIR Right   . POLYPECTOMY    . PROSTATE BIOPSY  1999  . PROSTATECTOMY  04/25/2006    ROS- all systems are reviewed and negatives except as per HPI above  Current Outpatient Medications  Medication Sig Dispense Refill  . acetaminophen (TYLENOL) 500 MG tablet Take 500 mg  by mouth at bedtime.     Marland Kitchen apixaban (ELIQUIS) 5 MG TABS tablet Take 5 mg by mouth 2 (two) times daily.     Marland Kitchen atorvastatin (LIPITOR) 40 MG tablet Take 20 mg by mouth daily.     . cholecalciferol (VITAMIN D) 1000 UNITS tablet Take 1,000 Units by mouth daily.    . diclofenac sodium (VOLTAREN) 1 % GEL Apply 2 g topically in the morning and at bedtime.     Marland Kitchen diltiazem (CARDIZEM) 30 MG tablet TAKE 1 TABLET EVERY 4 HOURS AS NEEDED FOR AFIB RAPID HEART RATE OVER 100 90 tablet 1  . EPINEPHrine 0.3 mg/0.3 mL IJ SOAJ injection Inject 0.3 mg into the muscle as needed for anaphylaxis.    . fluticasone (FLONASE) 50 MCG/ACT nasal spray Place 1 spray into both nostrils at bedtime.     . Glucosamine-Chondroit-Vit C-Mn (GLUCOSAMINE 1500 COMPLEX PO) Take 1,500 mg by mouth in the morning.     Marland Kitchen HYDROcodone-acetaminophen (NORCO/VICODIN) 5-325 MG tablet Take 0.5 tablets by mouth as needed for moderate pain (When traveling).     Marland Kitchen L-Lysine 500 MG TABS Take 500 mg  by mouth in the morning.     Marland Kitchen LORazepam (ATIVAN) 1 MG tablet Take 1 mg by mouth at bedtime as needed for sleep (When traveling).     . methocarbamol (ROBAXIN) 500 MG tablet Take 1 tablet (500 mg total) by mouth every 6 (six) hours as needed for muscle spasms. 60 tablet 1  . Mometasone Furoate 100 MCG/ACT AERO Inhale 1 puff into the lungs at bedtime.     . montelukast (SINGULAIR) 10 MG tablet Take 10 mg by mouth at bedtime.    . Multiple Vitamin (MULTIVITAMIN) capsule Take 1 capsule by mouth daily.    . Naphazoline-Pheniramine (OPCON-A) 0.027-0.315 % SOLN Place 1 drop into both eyes daily as needed (allergies).     Marland Kitchen omeprazole (PRILOSEC) 20 MG capsule Take 20 mg by mouth 2 (two) times daily before a meal.     . OVER THE COUNTER MEDICATION Apply 1 application topically at bedtime. CBD lotion on joints    . predniSONE (DELTASONE) 10 MG tablet Take 10-20 mg by mouth daily as needed (Asthma).    Marland Kitchen PRESCRIPTION MEDICATION Inject 1 application into the skin every 14 (fourteen) days. Allergy injections    . Probiotic Product (ALIGN) 4 MG CAPS Take 4 mg by mouth daily.     . psyllium (METAMUCIL) 58.6 % powder Take 2 packets by mouth 2 (two) times daily.     . VENTOLIN HFA 108 (90 Base) MCG/ACT inhaler Inhale 1-2 puffs into the lungs as needed for wheezing or shortness of breath.     . vitamin C (ASCORBIC ACID) 500 MG tablet Take 500 mg by mouth daily.    Marland Kitchen zolpidem (AMBIEN) 10 MG tablet Take 10 mg by mouth at bedtime as needed for sleep.      Current Facility-Administered Medications  Medication Dose Route Frequency Provider Last Rate Last Admin  . 0.9 %  sodium chloride infusion  500 mL Intravenous Once Thornton Park, MD        Physical Exam: Vitals:   06/09/20 1429  BP: 118/72  Pulse: 65  SpO2: 95%  Weight: 212 lb 9.6 oz (96.4 kg)  Height: 5\' 8"  (1.727 m)    GEN- The patient is well appearing, alert and oriented x 3 today.   Head- normocephalic, atraumatic Eyes-  Sclera clear,  conjunctiva pink Ears- hearing intact Oropharynx- clear Lungs- Clear  to ausculation bilaterally, normal work of breathing Heart- Regular rate and rhythm, no murmurs, rubs or gallops, PMI not laterally displaced GI- soft, NT, ND, + BS Extremities- no clubbing, cyanosis, or edema  Wt Readings from Last 3 Encounters:  06/09/20 212 lb 9.6 oz (96.4 kg)  04/27/20 210 lb (95.3 kg)  03/16/20 210 lb (95.3 kg)    EKG tracing ordered today is personally reviewed and shows sinus  Assessment and Plan:  1. Paroxysmal atrial fibrillation Doing well off AAD therapy post ablation Rare episodes of short AF chads2vasc score is 3.  Continue eliquis  2. HL Continue statin  3. preop No ischemic symptoms,  Very active.  Ok to proceed with shoulder surgery if medically indicated.  Ok to hold eliquis 48 hours prior to the procedure.  Risks, benefits and potential toxicities for medications prescribed and/or refilled reviewed with patient today.   AF clinic in 6 months I will see in a year  [name] MD, [facility] 06/09/2020 2:50 PM

## 2020-06-11 DIAGNOSIS — C61 Malignant neoplasm of prostate: Secondary | ICD-10-CM | POA: Diagnosis not present

## 2020-06-17 DIAGNOSIS — J3089 Other allergic rhinitis: Secondary | ICD-10-CM | POA: Diagnosis not present

## 2020-06-17 DIAGNOSIS — J301 Allergic rhinitis due to pollen: Secondary | ICD-10-CM | POA: Diagnosis not present

## 2020-06-18 DIAGNOSIS — J189 Pneumonia, unspecified organism: Secondary | ICD-10-CM

## 2020-06-18 HISTORY — DX: Pneumonia, unspecified organism: J18.9

## 2020-06-24 DIAGNOSIS — Z23 Encounter for immunization: Secondary | ICD-10-CM | POA: Diagnosis not present

## 2020-07-01 DIAGNOSIS — J3089 Other allergic rhinitis: Secondary | ICD-10-CM | POA: Diagnosis not present

## 2020-07-01 DIAGNOSIS — J301 Allergic rhinitis due to pollen: Secondary | ICD-10-CM | POA: Diagnosis not present

## 2020-07-06 DIAGNOSIS — J3089 Other allergic rhinitis: Secondary | ICD-10-CM | POA: Diagnosis not present

## 2020-07-06 DIAGNOSIS — J4531 Mild persistent asthma with (acute) exacerbation: Secondary | ICD-10-CM | POA: Diagnosis not present

## 2020-07-06 DIAGNOSIS — J301 Allergic rhinitis due to pollen: Secondary | ICD-10-CM | POA: Diagnosis not present

## 2020-07-06 DIAGNOSIS — J4530 Mild persistent asthma, uncomplicated: Secondary | ICD-10-CM | POA: Diagnosis not present

## 2020-07-09 ENCOUNTER — Other Ambulatory Visit: Payer: Self-pay

## 2020-07-09 ENCOUNTER — Emergency Department (HOSPITAL_BASED_OUTPATIENT_CLINIC_OR_DEPARTMENT_OTHER): Payer: Medicare Other

## 2020-07-09 ENCOUNTER — Emergency Department (HOSPITAL_BASED_OUTPATIENT_CLINIC_OR_DEPARTMENT_OTHER)
Admission: EM | Admit: 2020-07-09 | Discharge: 2020-07-10 | Disposition: A | Discharge status: Home / Self Care | Payer: Medicare Other | Attending: Emergency Medicine | Admitting: Emergency Medicine

## 2020-07-09 ENCOUNTER — Encounter (HOSPITAL_BASED_OUTPATIENT_CLINIC_OR_DEPARTMENT_OTHER): Payer: Self-pay

## 2020-07-09 DIAGNOSIS — J45909 Unspecified asthma, uncomplicated: Secondary | ICD-10-CM | POA: Insufficient documentation

## 2020-07-09 DIAGNOSIS — Z87891 Personal history of nicotine dependence: Secondary | ICD-10-CM | POA: Insufficient documentation

## 2020-07-09 DIAGNOSIS — I480 Paroxysmal atrial fibrillation: Secondary | ICD-10-CM | POA: Diagnosis not present

## 2020-07-09 DIAGNOSIS — Z8546 Personal history of malignant neoplasm of prostate: Secondary | ICD-10-CM | POA: Insufficient documentation

## 2020-07-09 DIAGNOSIS — R410 Disorientation, unspecified: Secondary | ICD-10-CM | POA: Insufficient documentation

## 2020-07-09 DIAGNOSIS — R011 Cardiac murmur, unspecified: Secondary | ICD-10-CM | POA: Insufficient documentation

## 2020-07-09 DIAGNOSIS — Z20822 Contact with and (suspected) exposure to covid-19: Secondary | ICD-10-CM | POA: Diagnosis not present

## 2020-07-09 DIAGNOSIS — E876 Hypokalemia: Secondary | ICD-10-CM

## 2020-07-09 DIAGNOSIS — Z7951 Long term (current) use of inhaled steroids: Secondary | ICD-10-CM | POA: Diagnosis not present

## 2020-07-09 DIAGNOSIS — R0781 Pleurodynia: Secondary | ICD-10-CM | POA: Insufficient documentation

## 2020-07-09 DIAGNOSIS — J189 Pneumonia, unspecified organism: Secondary | ICD-10-CM | POA: Diagnosis not present

## 2020-07-09 DIAGNOSIS — R0602 Shortness of breath: Secondary | ICD-10-CM

## 2020-07-09 DIAGNOSIS — R509 Fever, unspecified: Secondary | ICD-10-CM | POA: Diagnosis not present

## 2020-07-09 DIAGNOSIS — R0789 Other chest pain: Secondary | ICD-10-CM | POA: Diagnosis not present

## 2020-07-09 DIAGNOSIS — Z7901 Long term (current) use of anticoagulants: Secondary | ICD-10-CM

## 2020-07-09 DIAGNOSIS — R059 Cough, unspecified: Secondary | ICD-10-CM | POA: Insufficient documentation

## 2020-07-09 LAB — I-STAT VENOUS BLOOD GAS, ED
Acid-Base Excess: 1 mmol/L (ref 0.0–2.0)
Bicarbonate: 23.1 mmol/L (ref 20.0–28.0)
Calcium, Ion: 1.17 mmol/L (ref 1.15–1.40)
HCT: 38 % — ABNORMAL LOW (ref 39.0–52.0)
Hemoglobin: 12.9 g/dL — ABNORMAL LOW (ref 13.0–17.0)
O2 Saturation: 90 %
Potassium: 3 mmol/L — ABNORMAL LOW (ref 3.5–5.1)
Sodium: 136 mmol/L (ref 135–145)
TCO2: 24 mmol/L (ref 22–32)
pCO2, Ven: 30 mmHg — ABNORMAL LOW (ref 44.0–60.0)
pH, Ven: 7.495 — ABNORMAL HIGH (ref 7.250–7.430)
pO2, Ven: 53 mmHg — ABNORMAL HIGH (ref 32.0–45.0)

## 2020-07-09 LAB — CBC WITH DIFFERENTIAL/PLATELET
Abs Immature Granulocytes: 0.05 10*3/uL (ref 0.00–0.07)
Basophils Absolute: 0 10*3/uL (ref 0.0–0.1)
Basophils Relative: 0 %
Eosinophils Absolute: 0 10*3/uL (ref 0.0–0.5)
Eosinophils Relative: 0 %
HCT: 38.7 % — ABNORMAL LOW (ref 39.0–52.0)
Hemoglobin: 13.3 g/dL (ref 13.0–17.0)
Immature Granulocytes: 0 %
Lymphocytes Relative: 16 %
Lymphs Abs: 2.3 10*3/uL (ref 0.7–4.0)
MCH: 31.6 pg (ref 26.0–34.0)
MCHC: 34.4 g/dL (ref 30.0–36.0)
MCV: 91.9 fL (ref 80.0–100.0)
Monocytes Absolute: 1.2 10*3/uL — ABNORMAL HIGH (ref 0.1–1.0)
Monocytes Relative: 8 %
Neutro Abs: 10.8 10*3/uL — ABNORMAL HIGH (ref 1.7–7.7)
Neutrophils Relative %: 76 %
Platelets: 141 10*3/uL — ABNORMAL LOW (ref 150–400)
RBC: 4.21 MIL/uL — ABNORMAL LOW (ref 4.22–5.81)
RDW: 11.9 % (ref 11.5–15.5)
WBC: 14.3 10*3/uL — ABNORMAL HIGH (ref 4.0–10.5)
nRBC: 0 % (ref 0.0–0.2)

## 2020-07-09 LAB — RESP PANEL BY RT-PCR (FLU A&B, COVID) ARPGX2
Influenza A by PCR: NEGATIVE
Influenza B by PCR: NEGATIVE
SARS Coronavirus 2 by RT PCR: NEGATIVE

## 2020-07-09 LAB — COMPREHENSIVE METABOLIC PANEL
ALT: 23 U/L (ref 0–44)
AST: 28 U/L (ref 15–41)
Albumin: 3.9 g/dL (ref 3.5–5.0)
Alkaline Phosphatase: 48 U/L (ref 38–126)
Anion gap: 11 (ref 5–15)
BUN: 18 mg/dL (ref 8–23)
CO2: 23 mmol/L (ref 22–32)
Calcium: 9 mg/dL (ref 8.9–10.3)
Chloride: 101 mmol/L (ref 98–111)
Creatinine, Ser: 1.04 mg/dL (ref 0.61–1.24)
GFR, Estimated: >60 mL/min (ref >60)
Glucose, Bld: 106 mg/dL — ABNORMAL HIGH (ref 70–99)
Potassium: 3.2 mmol/L — ABNORMAL LOW (ref 3.5–5.1)
Sodium: 135 mmol/L (ref 135–145)
Total Bilirubin: 0.8 mg/dL (ref 0.3–1.2)
Total Protein: 6.5 g/dL (ref 6.5–8.1)

## 2020-07-09 LAB — LACTIC ACID, PLASMA: Lactic Acid, Venous: 1.3 mmol/L (ref 0.5–1.9)

## 2020-07-09 MED ORDER — AEROCHAMBER PLUS FLO-VU MISC
1.0000 | Freq: Once | Status: AC
  Administered 2020-07-09: 1
  Filled 2020-07-09: qty 1

## 2020-07-09 MED ORDER — ACETAMINOPHEN 500 MG PO TABS
1000.0000 mg | ORAL_TABLET | Freq: Once | ORAL | Status: AC
  Administered 2020-07-09: 1000 mg via ORAL
  Filled 2020-07-09: qty 2

## 2020-07-09 MED ORDER — ALBUTEROL SULFATE HFA 108 (90 BASE) MCG/ACT IN AERS
2.0000 | INHALATION_SPRAY | RESPIRATORY_TRACT | Status: DC | PRN
  Administered 2020-07-09: 2 via RESPIRATORY_TRACT
  Filled 2020-07-09: qty 6.7

## 2020-07-09 NOTE — ED Provider Notes (Signed)
Care assumed from Dr. Rex Kras, patient with fever, confusion, cough pending respiratory pathogen panel and urinalysis.  We will need to reassess mental status after fever has been adequately treated.  Temperature is down to normal following acetaminophen.  Both patient and his wife state that he is back to his baseline mental status.  He is felt to be safe for discharge.  I have reviewed his x-ray and feel he probably has an infiltrate in the left base, will treat for community-acquired pneumonia.  He is given a dose of amoxicillin and sent home with prescription for same.  Also, it is noted that he has mild hypokalemia and is given a dose of oral potassium.  Return precautions discussed.  Results for orders placed or performed during the hospital encounter of 07/09/20  Resp Panel by RT-PCR (Flu A&B, Covid) Nasopharyngeal Swab   Specimen: Nasopharyngeal Swab; Nasopharyngeal(NP) swabs in vial transport medium  Result Value Ref Range   SARS Coronavirus 2 by RT PCR NEGATIVE NEGATIVE   Influenza A by PCR NEGATIVE NEGATIVE   Influenza B by PCR NEGATIVE NEGATIVE  Comprehensive metabolic panel  Result Value Ref Range   Sodium 135 135 - 145 mmol/L   Potassium 3.2 (L) 3.5 - 5.1 mmol/L   Chloride 101 98 - 111 mmol/L   CO2 23 22 - 32 mmol/L   Glucose, Bld 106 (H) 70 - 99 mg/dL   BUN 18 8 - 23 mg/dL   Creatinine, Ser 1.04 0.61 - 1.24 mg/dL   Calcium 9.0 8.9 - 10.3 mg/dL   Total Protein 6.5 6.5 - 8.1 g/dL   Albumin 3.9 3.5 - 5.0 g/dL   AST 28 15 - 41 U/L   ALT 23 0 - 44 U/L   Alkaline Phosphatase 48 38 - 126 U/L   Total Bilirubin 0.8 0.3 - 1.2 mg/dL   GFR, Estimated >60 >60 mL/min   Anion gap 11 5 - 15  CBC with Differential  Result Value Ref Range   WBC 14.3 (H) 4.0 - 10.5 K/uL   RBC 4.21 (L) 4.22 - 5.81 MIL/uL   Hemoglobin 13.3 13.0 - 17.0 g/dL   HCT 38.7 (L) 39.0 - 52.0 %   MCV 91.9 80.0 - 100.0 fL   MCH 31.6 26.0 - 34.0 pg   MCHC 34.4 30.0 - 36.0 g/dL   RDW 11.9 11.5 - 15.5 %   Platelets  141 (L) 150 - 400 K/uL   nRBC 0.0 0.0 - 0.2 %   Neutrophils Relative % 76 %   Neutro Abs 10.8 (H) 1.7 - 7.7 K/uL   Lymphocytes Relative 16 %   Lymphs Abs 2.3 0.7 - 4.0 K/uL   Monocytes Relative 8 %   Monocytes Absolute 1.2 (H) 0.1 - 1.0 K/uL   Eosinophils Relative 0 %   Eosinophils Absolute 0.0 0.0 - 0.5 K/uL   Basophils Relative 0 %   Basophils Absolute 0.0 0.0 - 0.1 K/uL   Immature Granulocytes 0 %   Abs Immature Granulocytes 0.05 0.00 - 0.07 K/uL  Lactic acid, plasma  Result Value Ref Range   Lactic Acid, Venous 1.3 0.5 - 1.9 mmol/L  Urinalysis, Routine w reflex microscopic  Result Value Ref Range   Color, Urine YELLOW YELLOW   APPearance CLEAR CLEAR   Specific Gravity, Urine 1.025 1.005 - 1.030   pH 5.5 5.0 - 8.0   Glucose, UA NEGATIVE NEGATIVE mg/dL   Hgb urine dipstick NEGATIVE NEGATIVE   Bilirubin Urine NEGATIVE NEGATIVE   Ketones, ur NEGATIVE  NEGATIVE mg/dL   Protein, ur TRACE (A) NEGATIVE mg/dL   Nitrite NEGATIVE NEGATIVE   Leukocytes,Ua NEGATIVE NEGATIVE  I-Stat venous blood gas, ED  Result Value Ref Range   pH, Ven 7.495 (H) 7.250 - 7.430   pCO2, Ven 30.0 (L) 44.0 - 60.0 mmHg   pO2, Ven 53.0 (H) 32.0 - 45.0 mmHg   Bicarbonate 23.1 20.0 - 28.0 mmol/L   TCO2 24 22 - 32 mmol/L   O2 Saturation 90.0 %   Acid-Base Excess 1.0 0.0 - 2.0 mmol/L   Sodium 136 135 - 145 mmol/L   Potassium 3.0 (L) 3.5 - 5.1 mmol/L   Calcium, Ion 1.17 1.15 - 1.40 mmol/L   HCT 38.0 (L) 39.0 - 52.0 %   Hemoglobin 12.9 (L) 13.0 - 17.0 g/dL   Collection site IV start    Drawn by Nurse    Sample type VENOUS    DG Chest Port 1 View  Result Date: 07/09/2020 CLINICAL DATA:  Cough, shortness of breath, and chest tightness since this afternoon. EXAM: PORTABLE CHEST 1 VIEW COMPARISON:  08/23/2018 FINDINGS: Heart size and pulmonary vascularity are normal. Lungs are clear. No pleural effusions. No pneumothorax. Mediastinal contours appear intact. Degenerative changes in the shoulders. IMPRESSION: No  active disease. Electronically Signed   By: Lucienne Capers M.D.   On: 07/09/2020 23:21   Images viewed by me.    Delora Fuel, MD 32/67/12 [phone]

## 2020-07-09 NOTE — ED Triage Notes (Signed)
Pt here POV from Home with Cough, SOB, Chest Tightness since this afternoon.  Patient has used Ventolin today. Unsure of Steroids.   Patients Wife States he was behaving in a confused manner due to the SOB. Febrile Here but A&Ox4.    Ambulatory, GCS 15.

## 2020-07-09 NOTE — ED Provider Notes (Signed)
[place] EMERGENCY DEPT Provider Note   CSN: [id number] Arrival date & time: 07/09/20  2152     History Chief Complaint  Patient presents with  . Shortness of Breath  . Cough    Muhammadali R Damarcus Reggio. is a 75 y.o. male.  75yo M w/ PMH below including asthma, DVT, A fib on anticoagulation who p/w cough and SOB. Wife reports ~1 week ago, he began having cold sx including cough, congestion, runny nose. It flared up his asthma and he saw his asthma doctor 4 days ago and was started on prednisone. He has continued to have cough and SOB and today started developing confusion and a fever which is what prompted them to come in. He's had decreased appetite and pain in ribs from coughing so hard but no body aches. No vomiting or diarrhea. Wife with similar symptoms recently.   The history is provided by the patient and the spouse.  Shortness of Breath Associated symptoms: cough   Cough Associated symptoms: shortness of breath        Past Medical History:  Diagnosis Date  . Aortic atherosclerosis ([place])   . Arthritis   . Asthma   . Diverticulitis   . Diverticulosis   . DVT (deep venous thrombosis) ([place])    right  . 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 4.3cm by CT 03/2018.  Marland Kitchen GERD (gastroesophageal reflux disease)   . Hemorrhoids   . History of echocardiogram    a. Echo 10/16 [facility]):  EF 55%, trace MR, mild TR, mild to mod AI, mild dilated Ao root (41 mm)  . Hyperlipemia   . Organic erectile dysfunction   . PAF (paroxysmal atrial fibrillation) ([place])    a. admx to [facility] in [place], [place] 10/16 with AF with RVR >> converted to NSR with IV Dilt;  b. Flecainide started >> ETT neg for pro-arrhythmia   . Prostate cancer ([place]) 04/25/2006   Gleason 3+4=7  . Prostatitis   . S/P cardiac cath    a. LHC at [facility] 10/16:  LM ok, mLAD 30%, LCx ok, dRCA 20%  . S/P radiation therapy 01/19/2014 through 03/05/2014                                                       Prostate bed 6600 cGy in 33 sessions                          . Torn rotator cuff    right  40% tear    Patient Active Problem List   Diagnosis Date Noted  . Atrial fibrillation ([place]) 08/24/2018  . Mild renal insufficiency 08/24/2018  . Mild aortic regurgitation 08/24/2018  . 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 Paroxysmal atrial fibrillation ([place]) 03/28/2018  . Synovial cyst of lumbar facet joint 07/26/2017  . PAF (paroxysmal atrial fibrillation) ([place]) 02/26/2015  . Malignant neoplasm of prostate ([place]) 10/16/2013    Past Surgical History:  Procedure Laterality Date  . ABLATION OF DYSRHYTHMIC FOCUS  03/28/2018  . APPENDECTOMY  1967  . ATRIAL FIBRILLATION ABLATION N/A 03/28/2018   Procedure: ATRIAL FIBRILLATION ABLATION;  Surgeon: [name], MD;  Location: [place] CV LAB;  Service: Cardiovascular;  Laterality: N/A;  . ATRIAL FIBRILLATION ABLATION N/A  11/11/2019   Procedure: ATRIAL FIBRILLATION ABLATION;  Surgeon: [name], MD;  Location: [place] [facility];  Service: Cardiovascular;  Laterality: N/A;  . CATARACT EXTRACTION W/ INTRAOCULAR LENS IMPLANT Left 11/2011  . CATARACT EXTRACTION W/ INTRAOCULAR LENS IMPLANT Right 09/2015  . COLONOSCOPY    . LARYNX SURGERY  2000   vocal cord lesion- benign  . LUMBAR LAMINECTOMY/DECOMPRESSION MICRODISCECTOMY Right 07/26/2017   Procedure: RIGHT LUMBAR 3- LUMBAR 4 FORAMINOTOMY WITH RESECTION OF SYNOVIAL CYST;  Surgeon: Erline Levine, MD;  Location: [facility];  Service: Neurosurgery;  Laterality: Right;  RIGHT LUMBAR 3- LUMBAR 4 FORAMINOTOMY WITH RESECTION OF SYNOVIAL CYST  . MENISCUS REPAIR Right   . POLYPECTOMY    . PROSTATE BIOPSY  1999  . PROSTATECTOMY  04/25/2006       Family History  Problem Relation Age of Onset  . Emphysema Father   . Lung cancer Father   . Hypertension Mother   . Heart disease Mother        heart attack  . Arthritis Mother   . Diabetes Maternal Grandmother   . Heart disease Maternal  Grandfather        Died age 80  . Liver cancer Paternal Grandmother   . Heart disease Paternal Grandfather        Died age 13  . Atrial fibrillation Son   . Stomach cancer Neg Hx   . Rectal cancer Neg Hx   . Esophageal cancer Neg Hx   . Colon cancer Neg Hx     Social History   Tobacco Use  . Smoking status: Former Smoker    Packs/day: 1.00    Years: 10.00    Pack years: 10.00    Types: Cigarettes    Quit date: 1972    Years since quitting: 50.3  . Smokeless tobacco: Never Used  . Tobacco comment: 1972  Vaping Use  . Vaping Use: Never used  Substance Use Topics  . Alcohol use: Yes    Alcohol/week: 7.0 standard drinks    Types: 7 Glasses of wine per week    Comment: 2  glasses wine or beer weekly  . Drug use: No    Home Medications Prior to Admission medications   Medication Sig Start Date End Date Taking? Authorizing Provider  acetaminophen (TYLENOL) 500 MG tablet Take 500 mg by mouth at bedtime.     [provider]  apixaban (ELIQUIS) 5 MG TABS tablet Take 5 mg by mouth 2 (two) times daily.     [provider]  atorvastatin (LIPITOR) 40 MG tablet Take 20 mg by mouth daily.  10/21/19   [provider]  cholecalciferol (VITAMIN D) 1000 UNITS tablet Take 1,000 Units by mouth daily.    [provider]  diclofenac sodium (VOLTAREN) 1 % GEL Apply 2 g topically in the morning and at bedtime.  12/08/18   [provider]  diltiazem (CARDIZEM) 30 MG tablet TAKE 1 TABLET EVERY 4 HOURS AS NEEDED FOR AFIB RAPID HEART RATE OVER 100 05/27/20   Sherran Needs, NP  EPINEPHrine 0.3 mg/0.3 mL IJ SOAJ injection Inject 0.3 mg into the muscle as needed for anaphylaxis. 04/06/19   [provider]  fluticasone (FLONASE) 50 MCG/ACT nasal spray Place 1 spray into both nostrils at bedtime.  09/04/13   [provider]  Glucosamine-Chondroit-Vit C-Mn (GLUCOSAMINE 1500 COMPLEX PO) Take 1,500 mg by mouth in the morning.     [provider]  HYDROcodone-acetaminophen (NORCO/VICODIN) 5-325 MG tablet Take  0.5 tablets by mouth as needed for moderate pain (When traveling).     [provider]  L-Lysine 500 MG TABS Take 500 mg by mouth in the morning.     [provider]  LORazepam (ATIVAN) 1 MG tablet Take 1 mg by mouth at bedtime as needed for sleep (When traveling).  04/18/19   [provider]  methocarbamol (ROBAXIN) 500 MG tablet Take 1 tablet (500 mg total) by mouth every 6 (six) hours as needed for muscle spasms. 07/27/17   Erline Levine, MD  Mometasone Furoate 100 MCG/ACT AERO Inhale 1 puff into the lungs at bedtime.     [provider]  montelukast (SINGULAIR) 10 MG tablet Take 10 mg by mouth at bedtime.    [provider]  Multiple Vitamin (MULTIVITAMIN) capsule Take 1 capsule by mouth daily.    [provider]  Naphazoline-Pheniramine (OPCON-A) 0.027-0.315 % SOLN Place 1 drop into both eyes daily as needed (allergies).     [provider]  omeprazole (PRILOSEC) 20 MG capsule Take 20 mg by mouth 2 (two) times daily before a meal.     [provider]  OVER THE COUNTER MEDICATION Apply 1 application topically at bedtime. CBD lotion on joints    [provider]  predniSONE (DELTASONE) 10 MG tablet Take 10-20 mg by mouth daily as needed (Asthma).    [provider]  PRESCRIPTION MEDICATION Inject 1 application into the skin every 14 (fourteen) days. Allergy injections    [provider]  Probiotic Product (ALIGN) 4 MG CAPS Take 4 mg by mouth daily.     [provider]  psyllium (METAMUCIL) 58.6 % powder Take 2 packets by mouth 2 (two) times daily.     [provider]  VENTOLIN HFA 108 (90 Base) MCG/ACT inhaler Inhale 1-2 puffs into the lungs as needed for wheezing or shortness of breath.  12/30/18   [provider]  vitamin C (ASCORBIC ACID) 500 MG tablet Take 500 mg by mouth daily.    [provider]  zolpidem (AMBIEN) 10 MG tablet Take 10 mg by mouth at bedtime as needed for sleep.  04/16/19   [provider]    Allergies    Patient has no known allergies.  Review of Systems   Review of Systems  Respiratory: Positive for cough and shortness of breath.     All other systems reviewed and are negative except that which was mentioned in HPI  Physical Exam Updated Vital Signs BP 135/66 (BP Location: Left Arm)   Pulse 94   Temp (!) 102.9 F (39.4 C) (Oral)   Resp 16   Ht 5\' 10"  (1.778 m)   Wt 95.3 kg   SpO2 100%   BMI 30.13 kg/m   Physical Exam Vitals and nursing note reviewed.  Constitutional:      General: He is not in acute distress.    Appearance: Normal appearance.  HENT:     Head: Normocephalic and atraumatic.  Eyes:     Conjunctiva/sclera: Conjunctivae normal.  Cardiovascular:     Rate and Rhythm: Normal rate and regular rhythm.     Heart sounds: Murmur heard.    Pulmonary:     Effort: Pulmonary effort is normal.     Breath sounds: Normal breath sounds.     Comments: Frequent dry cough Abdominal:     General: Abdomen is flat. Bowel sounds are normal. There is no distension.     Palpations: Abdomen is soft.  Tenderness: There is no abdominal tenderness.  Musculoskeletal:     Right lower leg: No edema.     Left lower leg: No edema.  Skin:    General: Skin is warm and dry.  Neurological:     Mental Status: He is alert and oriented to person, place, and time.     Comments: fluent  Psychiatric:        Mood and Affect: Mood normal.        Behavior: Behavior normal.     ED Results / Procedures / Treatments   Labs (all labs ordered are listed, but only abnormal results are displayed) Labs Reviewed  CBC WITH DIFFERENTIAL/PLATELET - Abnormal; Notable for the following components:      Result Value   WBC 14.3 (*)    RBC 4.21 (*)    HCT 38.7 (*)    Platelets 141 (*)    Neutro Abs 10.8 (*)    Monocytes Absolute 1.2 (*)     All other components within normal limits  RESP PANEL BY RT-PCR (FLU A&B, COVID) [id number]  COMPREHENSIVE METABOLIC PANEL  LACTIC ACID, PLASMA  URINALYSIS, ROUTINE W REFLEX MICROSCOPIC  I-STAT VENOUS BLOOD GAS, ED    EKG EKG Interpretation  Date/Time:  Friday July 09 2020 22:24:59 EDT Ventricular Rate:  92 PR Interval:  165 QRS Duration: 90 QT Interval:  320 QTC Calculation: 396 R Axis:   13 Text Interpretation: Sinus rhythm mild T wave flattening compared to previous Confirmed by Theotis Burrow [phone]) on 07/09/2020 10:27:01 PM   Radiology No results found.  Procedures Procedures   Medications Ordered in ED Medications  albuterol (VENTOLIN HFA) 108 (90 Base) MCG/ACT inhaler 2 puff (2 puffs Inhalation Given 07/09/20 2229)  aerochamber plus with mask device 1 each (1 each Other Given 07/09/20 2231)  acetaminophen (TYLENOL) tablet 1,000 mg (1,000 mg Oral Given 07/09/20 2309)    ED Course  I have reviewed the triage vital signs and the nursing notes.  Pertinent labs & imaging results that were available during my care of the patient were reviewed by me and considered in my medical decision making (see chart for details).    MDM Rules/Calculators/A&P                          Alert, nontoxic on exam, temp 102.9, remainder of vital signs normal.  No focal wheezing or abnormal lung sounds but he did have a very frequent cough.  He and wife are concerned about confusion; I am reassured by his level of alertness and normal orientation during conversation with me.  Lab work thus far notable for WBC 14.3 which is difficult to interpret in the setting of steroid use.  Remainder of blood work is pending as well as COVID/flu test and chest x-ray.  Patient given Tylenol and albuterol.  Signed out pending completion of work-up and reassessment. Final Clinical Impression(s) / ED Diagnoses Final diagnoses:  Shortness of breath    Rx / DC Orders ED Discharge Orders    None       Haja Crego,  Wenda Overland, MD 07/09/20 2318

## 2020-07-10 DIAGNOSIS — R0602 Shortness of breath: Secondary | ICD-10-CM | POA: Diagnosis not present

## 2020-07-10 LAB — URINALYSIS, ROUTINE W REFLEX MICROSCOPIC
Bilirubin Urine: NEGATIVE
Glucose, UA: NEGATIVE mg/dL
Hgb urine dipstick: NEGATIVE
Ketones, ur: NEGATIVE mg/dL
Leukocytes,Ua: NEGATIVE
Nitrite: NEGATIVE
Specific Gravity, Urine: 1.025 (ref 1.005–1.030)
pH: 5.5 (ref 5.0–8.0)

## 2020-07-10 MED ORDER — POTASSIUM CHLORIDE CRYS ER 20 MEQ PO TBCR
40.0000 meq | EXTENDED_RELEASE_TABLET | Freq: Once | ORAL | Status: AC
  Administered 2020-07-10: 40 meq via ORAL
  Filled 2020-07-10: qty 2

## 2020-07-10 MED ORDER — AMOXICILLIN 500 MG PO CAPS: 1000.0000 mg | ORAL_CAPSULE | Freq: Two times a day (BID) | ORAL | 0 refills | Status: DC

## 2020-07-10 MED ORDER — AMOXICILLIN 500 MG PO CAPS
1000.0000 mg | ORAL_CAPSULE | Freq: Once | ORAL | Status: AC
  Administered 2020-07-10: 1000 mg via ORAL
  Filled 2020-07-10: qty 2

## 2020-07-10 NOTE — Discharge Instructions (Addendum)
Take acetaminophen as needed for fever.  Continue using albuterol inhaler as needed for cough, wheezing, shortness of breath.  Return to the emergency department if symptoms are getting worse.

## 2020-07-11 ENCOUNTER — Emergency Department (HOSPITAL_BASED_OUTPATIENT_CLINIC_OR_DEPARTMENT_OTHER)
Admission: EM | Admit: 2020-07-11 | Discharge: 2020-07-11 | Disposition: A | Discharge status: Home / Self Care | Payer: Medicare Other | Attending: Emergency Medicine | Admitting: Emergency Medicine

## 2020-07-11 ENCOUNTER — Emergency Department (HOSPITAL_BASED_OUTPATIENT_CLINIC_OR_DEPARTMENT_OTHER): Payer: Medicare Other | Admitting: Radiology

## 2020-07-11 ENCOUNTER — Other Ambulatory Visit: Payer: Self-pay

## 2020-07-11 ENCOUNTER — Encounter (HOSPITAL_BASED_OUTPATIENT_CLINIC_OR_DEPARTMENT_OTHER): Payer: Self-pay

## 2020-07-11 DIAGNOSIS — Z8546 Personal history of malignant neoplasm of prostate: Secondary | ICD-10-CM | POA: Diagnosis not present

## 2020-07-11 DIAGNOSIS — I4891 Unspecified atrial fibrillation: Secondary | ICD-10-CM | POA: Diagnosis not present

## 2020-07-11 DIAGNOSIS — R509 Fever, unspecified: Secondary | ICD-10-CM | POA: Diagnosis not present

## 2020-07-11 DIAGNOSIS — J189 Pneumonia, unspecified organism: Secondary | ICD-10-CM

## 2020-07-11 DIAGNOSIS — Z20822 Contact with and (suspected) exposure to covid-19: Secondary | ICD-10-CM | POA: Insufficient documentation

## 2020-07-11 DIAGNOSIS — Z87891 Personal history of nicotine dependence: Secondary | ICD-10-CM | POA: Insufficient documentation

## 2020-07-11 DIAGNOSIS — Z7901 Long term (current) use of anticoagulants: Secondary | ICD-10-CM | POA: Diagnosis not present

## 2020-07-11 DIAGNOSIS — J45909 Unspecified asthma, uncomplicated: Secondary | ICD-10-CM | POA: Insufficient documentation

## 2020-07-11 DIAGNOSIS — R059 Cough, unspecified: Secondary | ICD-10-CM

## 2020-07-11 DIAGNOSIS — J181 Lobar pneumonia, unspecified organism: Secondary | ICD-10-CM | POA: Diagnosis not present

## 2020-07-11 LAB — CBC WITH DIFFERENTIAL/PLATELET
Abs Immature Granulocytes: 0.06 10*3/uL (ref 0.00–0.07)
Basophils Absolute: 0 10*3/uL (ref 0.0–0.1)
Basophils Relative: 0 %
Eosinophils Absolute: 0.1 10*3/uL (ref 0.0–0.5)
Eosinophils Relative: 0 %
HCT: 34.4 % — ABNORMAL LOW (ref 39.0–52.0)
Hemoglobin: 12 g/dL — ABNORMAL LOW (ref 13.0–17.0)
Immature Granulocytes: 1 %
Lymphocytes Relative: 10 %
Lymphs Abs: 1.3 10*3/uL (ref 0.7–4.0)
MCH: 31.8 pg (ref 26.0–34.0)
MCHC: 34.9 g/dL (ref 30.0–36.0)
MCV: 91.2 fL (ref 80.0–100.0)
Monocytes Absolute: 1.2 10*3/uL — ABNORMAL HIGH (ref 0.1–1.0)
Monocytes Relative: 9 %
Neutro Abs: 10.2 10*3/uL — ABNORMAL HIGH (ref 1.7–7.7)
Neutrophils Relative %: 80 %
Platelets: 146 10*3/uL — ABNORMAL LOW (ref 150–400)
RBC: 3.77 MIL/uL — ABNORMAL LOW (ref 4.22–5.81)
RDW: 11.8 % (ref 11.5–15.5)
WBC: 12.8 10*3/uL — ABNORMAL HIGH (ref 4.0–10.5)
nRBC: 0 % (ref 0.0–0.2)

## 2020-07-11 LAB — BASIC METABOLIC PANEL
Anion gap: 8 (ref 5–15)
BUN: 12 mg/dL (ref 8–23)
CO2: 23 mmol/L (ref 22–32)
Calcium: 8.5 mg/dL — ABNORMAL LOW (ref 8.9–10.3)
Chloride: 99 mmol/L (ref 98–111)
Creatinine, Ser: 0.91 mg/dL (ref 0.61–1.24)
GFR, Estimated: >60 mL/min (ref >60)
Glucose, Bld: 141 mg/dL — ABNORMAL HIGH (ref 70–99)
Potassium: 3.4 mmol/L — ABNORMAL LOW (ref 3.5–5.1)
Sodium: 130 mmol/L — ABNORMAL LOW (ref 135–145)

## 2020-07-11 LAB — RESP PANEL BY RT-PCR (FLU A&B, COVID) ARPGX2
Influenza A by PCR: NEGATIVE
Influenza B by PCR: NEGATIVE
SARS Coronavirus 2 by RT PCR: NEGATIVE

## 2020-07-11 MED ORDER — PREDNISONE 20 MG PO TABS: 60.0000 mg | ORAL_TABLET | Freq: Every day | ORAL | 0 refills | Status: AC

## 2020-07-11 MED ORDER — ACETAMINOPHEN 325 MG PO TABS
650.0000 mg | ORAL_TABLET | Freq: Once | ORAL | Status: DC
  Filled 2020-07-11: qty 2

## 2020-07-11 MED ORDER — DOXYCYCLINE HYCLATE 100 MG PO CAPS: 100.0000 mg | ORAL_CAPSULE | Freq: Two times a day (BID) | ORAL | 0 refills | Status: DC

## 2020-07-11 MED ORDER — HYDROCODONE-HOMATROPINE 5-1.5 MG/5ML PO SYRP: 5.0000 mL | ORAL_SOLUTION | Freq: Four times a day (QID) | ORAL | 0 refills | Status: DC | PRN

## 2020-07-11 MED ORDER — SODIUM CHLORIDE 0.9 % IV SOLN
1.0000 g | Freq: Once | INTRAVENOUS | Status: AC
  Administered 2020-07-11: 1 g via INTRAVENOUS
  Filled 2020-07-11: qty 10

## 2020-07-11 MED ORDER — DOXYCYCLINE HYCLATE 100 MG PO TABS
100.0000 mg | ORAL_TABLET | Freq: Once | ORAL | Status: AC
  Administered 2020-07-11: 100 mg via ORAL
  Filled 2020-07-11: qty 1

## 2020-07-11 NOTE — ED Notes (Signed)
Attempted iv left fa, and right ac without success.

## 2020-07-11 NOTE — ED Provider Notes (Signed)
[place] EMERGENCY DEPT Provider Note   CSN: [id number] Arrival date & time: 07/11/20  1503     History No chief complaint on file.   Brandon Oren Section. is a 75 y.o. male.  He was seen here on the day his symptoms started.  Negative chest x-ray but was sent home with amoxicillin for possible pneumonia.  He has continued to have a fever and cough.  He is also planning to go on a journey to [place] in 2 days.  The history is provided by the patient.  Cough Cough characteristics:  Productive and hacking Sputum characteristics:  [place] Severity:  Severe Onset quality:  Sudden Duration:  3 days Timing:  Constant Progression:  Unchanged Chronicity:  New Smoker: no   Context: not sick contacts   Context comment:  History of asthma. Fully vaccinated for COVID-19. Relieved by:  Nothing Worsened by:  Nothing Ineffective treatments: amoxicillin. Associated symptoms: chills, fever and headaches   Associated symptoms: no chest pain, no ear pain, no rash, no shortness of breath and no sore throat        Past Medical History:  Diagnosis Date  . Aortic atherosclerosis ([place])   . Arthritis   . Asthma   . Diverticulitis   . Diverticulosis   . DVT (deep venous thrombosis) ([place])    right  . 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 4.3cm by CT 03/2018.  Marland Kitchen GERD (gastroesophageal reflux disease)   . Hemorrhoids   . History of echocardiogram    a. Echo 10/16 [facility]):  EF 55%, trace MR, mild TR, mild to mod AI, mild dilated Ao root (41 mm)  . Hyperlipemia   . Organic erectile dysfunction   . PAF (paroxysmal atrial fibrillation) ([place])    a. admx to [facility] in [place], [place] 10/16 with AF with RVR >> converted to NSR with IV Dilt;  b. Flecainide started >> ETT neg for pro-arrhythmia   . Prostate cancer ([place]) 04/25/2006   Gleason 3+4=7  . Prostatitis   . S/P cardiac cath    a. LHC at [facility] 10/16:  LM ok, mLAD 30%, LCx ok, dRCA 20%  . S/P radiation  therapy 01/19/2014 through 03/05/2014                                                      Prostate bed 6600 cGy in 33 sessions                          . Torn rotator cuff    right  40% tear    Patient Active Problem List   Diagnosis Date Noted  . Atrial fibrillation ([place]) 08/24/2018  . Mild renal insufficiency 08/24/2018  . Mild aortic regurgitation 08/24/2018  . 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 Paroxysmal atrial fibrillation ([place]) 03/28/2018  . Synovial cyst of lumbar facet joint 07/26/2017  . PAF (paroxysmal atrial fibrillation) ([place]) 02/26/2015  . Malignant neoplasm of prostate ([place]) 10/16/2013    Past Surgical History:  Procedure Laterality Date  . ABLATION OF DYSRHYTHMIC FOCUS  03/28/2018  . APPENDECTOMY  1967  . ATRIAL FIBRILLATION ABLATION N/A 03/28/2018   Procedure: ATRIAL FIBRILLATION ABLATION;  Surgeon: [name], MD;  Location: [place] CV LAB;  Service: Cardiovascular;  Laterality: N/A;  . ATRIAL FIBRILLATION ABLATION N/A 11/11/2019   Procedure: ATRIAL FIBRILLATION ABLATION;  Surgeon: [name], MD;  Location: [place] CV LAB;  Service: Cardiovascular;  Laterality: N/A;  . CATARACT EXTRACTION W/ INTRAOCULAR LENS IMPLANT Left 11/2011  . CATARACT EXTRACTION W/ INTRAOCULAR LENS IMPLANT Right 09/2015  . COLONOSCOPY    . LARYNX SURGERY  2000   vocal cord lesion- benign  . LUMBAR LAMINECTOMY/DECOMPRESSION MICRODISCECTOMY Right 07/26/2017   Procedure: RIGHT LUMBAR 3- LUMBAR 4 FORAMINOTOMY WITH RESECTION OF SYNOVIAL CYST;  Surgeon: Erline Levine, MD;  Location: [facility];  Service: Neurosurgery;  Laterality: Right;  RIGHT LUMBAR 3- LUMBAR 4 FORAMINOTOMY WITH RESECTION OF SYNOVIAL CYST  . MENISCUS REPAIR Right   . POLYPECTOMY    . PROSTATE BIOPSY  1999  . PROSTATECTOMY  04/25/2006       Family History  Problem Relation Age of Onset  . Emphysema Father   . Lung cancer Father   . Hypertension Mother   . Heart disease Mother        heart attack  .  Arthritis Mother   . Diabetes Maternal Grandmother   . Heart disease Maternal Grandfather        Died age 23  . Liver cancer Paternal Grandmother   . Heart disease Paternal Grandfather        Died age 44  . Atrial fibrillation Son   . Stomach cancer Neg Hx   . Rectal cancer Neg Hx   . Esophageal cancer Neg Hx   . Colon cancer Neg Hx     Social History   Tobacco Use  . Smoking status: Former Smoker    Packs/day: 1.00    Years: 10.00    Pack years: 10.00    Types: Cigarettes    Quit date: 1972    Years since quitting: 50.3  . Smokeless tobacco: Never Used  . Tobacco comment: 1972  Vaping Use  . Vaping Use: Never used  Substance Use Topics  . Alcohol use: Yes    Alcohol/week: 7.0 standard drinks    Types: 7 Glasses of wine per week    Comment: 2  glasses wine or beer weekly  . Drug use: No    Home Medications Prior to Admission medications   Medication Sig Start Date End Date Taking? Authorizing Provider  doxycycline (VIBRAMYCIN) 100 MG capsule Take 1 capsule (100 mg total) by mouth 2 (two) times daily. 07/11/20  Yes Arnaldo Natal, MD  HYDROcodone-homatropine [facility]) 5-1.5 MG/5ML syrup Take 5 mLs by mouth every 6 (six) hours as needed for cough. 07/11/20  Yes Arnaldo Natal, MD  predniSONE (DELTASONE) 20 MG tablet Take 3 tablets (60 mg total) by mouth daily for 5 days. 07/11/20 07/16/20 Yes Arnaldo Natal, MD  acetaminophen (TYLENOL) 500 MG tablet Take 500 mg by mouth at bedtime.     [provider]  apixaban (ELIQUIS) 5 MG TABS tablet Take 5 mg by mouth 2 (two) times daily.     [provider]  atorvastatin (LIPITOR) 40 MG tablet Take 20 mg by mouth daily.  10/21/19   [provider]  cholecalciferol (VITAMIN D) 1000 UNITS tablet Take 1,000 Units by mouth daily.    [provider]  diclofenac sodium (VOLTAREN) 1 % GEL Apply 2 g topically in the morning and at bedtime.  12/08/18   [provider]  diltiazem (CARDIZEM) 30 MG tablet  TAKE 1 TABLET EVERY 4 HOURS AS NEEDED FOR AFIB RAPID HEART RATE  OVER 100 05/27/20   Newman Nip, NP  EPINEPHrine 0.3 mg/0.3 mL IJ SOAJ injection Inject 0.3 mg into the muscle as needed for anaphylaxis. 04/06/19   [provider]  fluticasone (FLONASE) 50 MCG/ACT nasal spray Place 1 spray into both nostrils at bedtime.  09/04/13   [provider]  Glucosamine-Chondroit-Vit C-Mn (GLUCOSAMINE 1500 COMPLEX PO) Take 1,500 mg by mouth in the morning.     [provider]  HYDROcodone-acetaminophen (NORCO/VICODIN) 5-325 MG tablet Take 0.5 tablets by mouth as needed for moderate pain (When traveling).     [provider]  L-Lysine 500 MG TABS Take 500 mg by mouth in the morning.     [provider]  LORazepam (ATIVAN) 1 MG tablet Take 1 mg by mouth at bedtime as needed for sleep (When traveling).  04/18/19   [provider]  methocarbamol (ROBAXIN) 500 MG tablet Take 1 tablet (500 mg total) by mouth every 6 (six) hours as needed for muscle spasms. 07/27/17   Maeola Harman, MD  Mometasone Furoate 100 MCG/ACT AERO Inhale 1 puff into the lungs at bedtime.     [provider]  montelukast (SINGULAIR) 10 MG tablet Take 10 mg by mouth at bedtime.    [provider]  Multiple Vitamin (MULTIVITAMIN) capsule Take 1 capsule by mouth daily.    [provider]  Naphazoline-Pheniramine (OPCON-A) 0.027-0.315 % SOLN Place 1 drop into both eyes daily as needed (allergies).     [provider]  omeprazole (PRILOSEC) 20 MG capsule Take 20 mg by mouth 2 (two) times daily before a meal.     [provider]  OVER THE COUNTER MEDICATION Apply 1 application topically at bedtime. CBD lotion on joints    [provider]  PRESCRIPTION MEDICATION Inject 1 application into the skin every 14 (fourteen) days. Allergy injections    [provider]  Probiotic Product (ALIGN) 4 MG CAPS Take 4 mg by mouth daily.     [provider]  psyllium (METAMUCIL) 58.6 % powder Take 2 packets by mouth 2 (two) times daily.     [provider]  VENTOLIN HFA 108 (90 Base) MCG/ACT inhaler Inhale 1-2 puffs into the lungs as needed for wheezing or shortness of breath.  12/30/18   [provider]  vitamin C (ASCORBIC ACID) 500 MG tablet Take 500 mg by mouth daily.    [provider]  zolpidem (AMBIEN) 10 MG tablet Take 10 mg by mouth at bedtime as needed for sleep.  04/16/19   [provider]    Allergies    Patient has no known allergies.  Review of Systems   Review of Systems  Constitutional: Positive for chills and fever.  HENT: Negative for ear pain and sore throat.   Eyes: Negative for pain and visual disturbance.  Respiratory: Positive for cough. Negative for shortness of breath.   Cardiovascular: Negative for chest pain and palpitations.  Gastrointestinal: Negative for abdominal pain and vomiting.  Genitourinary: Negative for dysuria and hematuria.  Musculoskeletal: Negative for arthralgias and back pain.  Skin: Negative for color change and rash.  Neurological: Positive for headaches. Negative for seizures and syncope.  All other systems reviewed and are negative.   Physical Exam Updated Vital Signs BP 130/70   Pulse 89   Temp (!) 102.7 F (39.3 C) (Oral) Comment: RN made aware  Resp 20   SpO2 96%   Physical Exam Vitals and nursing note reviewed.  Constitutional:  Appearance: He is well-developed.     Comments: Frequent cough  HENT:     Head: Normocephalic and atraumatic.  Eyes:     Conjunctiva/sclera: Conjunctivae normal.  Cardiovascular:     Rate and Rhythm: Normal rate and regular rhythm.     Heart sounds: No murmur heard.   Pulmonary:     Effort: Pulmonary effort is normal. No respiratory distress.     Breath sounds: Normal breath sounds.  Abdominal:     Palpations: Abdomen is soft.     Tenderness: There is no abdominal tenderness.   Musculoskeletal:     Cervical back: Neck supple.  Skin:    General: Skin is warm and dry.  Neurological:     General: No focal deficit present.     Mental Status: He is alert.  Psychiatric:        Mood and Affect: Mood normal.     ED Results / Procedures / Treatments   Labs (all labs ordered are listed, but only abnormal results are displayed) Labs Reviewed  BASIC METABOLIC PANEL - Abnormal; Notable for the following components:      Result Value   Sodium 130 (*)    Potassium 3.4 (*)    Glucose, Bld 141 (*)    Calcium 8.5 (*)    All other components within normal limits  CBC WITH DIFFERENTIAL/PLATELET - Abnormal; Notable for the following components:   WBC 12.8 (*)    RBC 3.77 (*)    Hemoglobin 12.0 (*)    HCT 34.4 (*)    Platelets 146 (*)    Neutro Abs 10.2 (*)    Monocytes Absolute 1.2 (*)    All other components within normal limits  RESP PANEL BY RT-PCR (FLU A&B, COVID) [id number]    EKG EKG Interpretation  Date/Time:  Sunday July 11 2020 15:41:20 EDT Ventricular Rate:  87 PR Interval:  172 QRS Duration: 92 QT Interval:  331 QTC Calculation: 399 R Axis:   23 Text Interpretation: Sinus rhythm Atrial premature complex normal axis no acute ischemia Confirmed by Lorre Munroe ([id number]) on 07/11/2020 5:14:34 PM   Radiology DG Chest Port 1 View  Result Date: 07/11/2020 CLINICAL DATA:  Cough and fever. EXAM: PORTABLE CHEST 1 VIEW COMPARISON:  Radiograph 2 days ago 07/09/2020 FINDINGS: Patchy bibasilar opacities are new from prior exam, left greater than right. Stable heart size and mediastinal contours. No pulmonary edema, pleural effusion, or pneumothorax. No acute osseous abnormalities are seen. Left shoulder osteoarthritis. IMPRESSION: Patchy bibasilar opacities, left greater than right, suspicious for pneumonia. This is new from prior exam. Electronically Signed   By: Keith Rake M.D.   On: 07/11/2020 16:16   DG Chest Port 1 View  Result Date: 07/09/2020 CLINICAL  DATA:  Cough, shortness of breath, and chest tightness since this afternoon. EXAM: PORTABLE CHEST 1 VIEW COMPARISON:  08/23/2018 FINDINGS: Heart size and pulmonary vascularity are normal. Lungs are clear. No pleural effusions. No pneumothorax. Mediastinal contours appear intact. Degenerative changes in the shoulders. IMPRESSION: No active disease. Electronically Signed   By: Lucienne Capers M.D.   On: 07/09/2020 23:21    Procedures Procedures   Medications Ordered in ED Medications  acetaminophen (TYLENOL) tablet 650 mg (has no administration in time range)  cefTRIAXone (ROCEPHIN) 1 g in sodium chloride 0.9 % 100 mL IVPB (1 g Intravenous New Bag/Given 07/11/20 1651)  doxycycline (VIBRA-TABS) tablet 100 mg (100 mg Oral Given 07/11/20 1651)    ED Course  I have reviewed the triage  vital signs and the nursing notes.  Pertinent labs & imaging results that were available during my care of the patient were reviewed by me and considered in my medical decision making (see chart for details).    MDM Rules/Calculators/A&P                          Seung Oren Section. is well-appearing.  He has suffered from a respiratory illness for 3 days and now has evidence of pneumonia on x-ray.  He was given Rocephin and doxycycline and will be discharged on doxycycline.  I have asked him to discontinue his amoxicillin.  I will also start him on a short course of prednisone because he does have a history of asthma.  He is otherwise well-appearing.  He does not have confusion, and this seemed to be linked to his fever.  This seems clearly infectious and does not seem consistent with another etiology of cough such as congestive heart failure, PE, or other emergent cardiopulmonary condition. Final Clinical Impression(s) / ED Diagnoses Final diagnoses:  Community acquired pneumonia of left lower lobe of lung    Rx / DC Orders ED Discharge Orders         Ordered    doxycycline (VIBRAMYCIN) 100 MG capsule  2 times  daily        07/11/20 1716    predniSONE (DELTASONE) 20 MG tablet  Daily        07/11/20 1716    HYDROcodone-homatropine (HYCODAN) 5-1.5 MG/5ML syrup  Every 6 hours PRN        07/11/20 1716           Arnaldo Natal, MD 07/11/20 1721

## 2020-07-11 NOTE — ED Triage Notes (Signed)
He tells me he was seen here for fever and congested cough and "not feeling well" a couple of days ago. He is here because all of these symptoms persist; and he is scheduled to go on vacation this Wed.

## 2020-07-13 DIAGNOSIS — J189 Pneumonia, unspecified organism: Secondary | ICD-10-CM | POA: Diagnosis not present

## 2020-07-13 DIAGNOSIS — Z1152 Encounter for screening for COVID-19: Secondary | ICD-10-CM | POA: Diagnosis not present

## 2020-07-13 DIAGNOSIS — J45909 Unspecified asthma, uncomplicated: Secondary | ICD-10-CM | POA: Diagnosis not present

## 2020-07-13 DIAGNOSIS — J9801 Acute bronchospasm: Secondary | ICD-10-CM | POA: Diagnosis not present

## 2020-07-13 DIAGNOSIS — E871 Hypo-osmolality and hyponatremia: Secondary | ICD-10-CM | POA: Diagnosis not present

## 2020-07-13 DIAGNOSIS — D72829 Elevated white blood cell count, unspecified: Secondary | ICD-10-CM | POA: Diagnosis not present

## 2020-07-15 DIAGNOSIS — F5101 Primary insomnia: Secondary | ICD-10-CM | POA: Diagnosis not present

## 2020-07-15 DIAGNOSIS — K219 Gastro-esophageal reflux disease without esophagitis: Secondary | ICD-10-CM | POA: Diagnosis not present

## 2020-07-15 DIAGNOSIS — I2510 Atherosclerotic heart disease of native coronary artery without angina pectoris: Secondary | ICD-10-CM | POA: Diagnosis not present

## 2020-07-15 DIAGNOSIS — Z20828 Contact with and (suspected) exposure to other viral communicable diseases: Secondary | ICD-10-CM | POA: Diagnosis not present

## 2020-07-15 DIAGNOSIS — I4891 Unspecified atrial fibrillation: Secondary | ICD-10-CM | POA: Diagnosis not present

## 2020-07-15 DIAGNOSIS — R059 Cough, unspecified: Secondary | ICD-10-CM | POA: Diagnosis not present

## 2020-07-16 ENCOUNTER — Ambulatory Visit: Payer: Medicare Other | Admitting: Pulmonary Disease

## 2020-07-16 ENCOUNTER — Ambulatory Visit (INDEPENDENT_AMBULATORY_CARE_PROVIDER_SITE_OTHER): Payer: Medicare Other | Admitting: Pulmonary Disease

## 2020-07-16 ENCOUNTER — Encounter: Payer: Self-pay | Admitting: Pulmonary Disease

## 2020-07-16 ENCOUNTER — Other Ambulatory Visit: Payer: Self-pay

## 2020-07-16 VITALS — BP 138/78 | HR 69 | Ht 70.0 in | Wt 215.0 lb

## 2020-07-16 DIAGNOSIS — J189 Pneumonia, unspecified organism: Secondary | ICD-10-CM

## 2020-07-16 MED ORDER — LEVOFLOXACIN 750 MG PO TABS: 750.0000 mg | ORAL_TABLET | Freq: Every day | ORAL | 0 refills | Status: DC

## 2020-07-16 MED ORDER — PREDNISONE 20 MG PO TABS: ORAL_TABLET | ORAL | 0 refills | Status: DC

## 2020-07-16 NOTE — Progress Notes (Signed)
Brandon Simmons    [id number]    1946/02/18  Primary Care Physician:Paterson, Quillian Quince, MD  Referring Physician: Leanna Battles, MD [address] [place],  [place] [address]  Chief complaint: Consult for pneumonia  HPI: 75 year old with history of arrhythmias, asthma, prostate cancer, DVT Developed community-acquired pneumonia in mid April.  Symptoms started off with a upper respiratory viral infection and proceeded to cough, fevers of 102.  He was evaluated in the ED on 4/22 and given amoxicillin with no improvement.  He was reevaluated on 4/24 and doxycycline added.  He got a round of prednisone as well.  He was given breztri from his primary care doctor but had to stop after few days as he developed sore throat and hoarseness.  Continues to be symptomatic with persistent cough, nonproductive in nature, dyspnea.  He was scheduled to leave the clinic for a 3-week trip yesterday but had to cancel due to symptoms  History notable for mild asthma for which he sees Dr. Remus Blake at allergy center.  Treated with Asmanex inhaler, albuterol. 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  No feather pillows or comforters Smoking history: Minimal smoking history in his 12s Travel history: No significant travel history Relevant family history: No family history of lung disease   Outpatient Encounter Medications as of 07/16/2020  Medication Sig  . acetaminophen (TYLENOL) 500 MG tablet Take 500 mg by mouth at bedtime.   Marland Kitchen apixaban (ELIQUIS) 5 MG TABS tablet Take 5 mg by mouth 2 (two) times daily.   Marland Kitchen atorvastatin (LIPITOR) 40 MG tablet Take 20 mg by mouth daily.   . cholecalciferol (VITAMIN D) 1000 UNITS tablet Take 1,000 Units by mouth daily.  . diclofenac sodium (VOLTAREN) 1 % GEL Apply 2 g  topically in the morning and at bedtime.   Marland Kitchen diltiazem (CARDIZEM) 30 MG tablet TAKE 1 TABLET EVERY 4 HOURS AS NEEDED FOR AFIB RAPID HEART RATE OVER 100  . doxycycline (VIBRAMYCIN) 100 MG capsule Take 1 capsule (100 mg total) by mouth 2 (two) times daily.  Marland Kitchen EPINEPHrine 0.3 mg/0.3 mL IJ SOAJ injection Inject 0.3 mg into the muscle as needed for anaphylaxis.  . fluticasone (FLONASE) 50 MCG/ACT nasal spray Place 1 spray into both nostrils at bedtime.   . Glucosamine-Chondroit-Vit C-Mn (GLUCOSAMINE 1500 COMPLEX PO) Take 1,500 mg by mouth in the morning.   Marland Kitchen HYDROcodone-acetaminophen (NORCO/VICODIN) 5-325 MG tablet Take 0.5 tablets by mouth as needed for moderate pain (When traveling).   Marland Kitchen HYDROcodone-homatropine (HYCODAN) 5-1.5 MG/5ML syrup Take 5 mLs by mouth every 6 (six) hours as needed for cough.  . L-Lysine 500 MG TABS Take 500 mg by mouth in the morning.   Marland Kitchen LORazepam (ATIVAN) 1 MG tablet Take 1 mg by mouth at bedtime as needed for sleep (When traveling).   . methocarbamol (ROBAXIN) 500 MG tablet Take 1 tablet (500 mg total) by mouth every 6 (six) hours as needed for muscle spasms.  . Mometasone Furoate 100 MCG/ACT AERO Inhale 1 puff into the lungs at bedtime.   . montelukast (SINGULAIR) 10 MG tablet Take 10 mg by mouth at bedtime.  . Multiple Vitamin (MULTIVITAMIN) capsule Take 1 capsule by mouth daily.  . Naphazoline-Pheniramine (OPCON-A) 0.027-0.315 % SOLN Place 1 drop into both  eyes daily as needed (allergies).   Marland Kitchen omeprazole (PRILOSEC) 20 MG capsule Take 20 mg by mouth 2 (two) times daily before a meal.   . OVER THE COUNTER MEDICATION Apply 1 application topically at bedtime. CBD lotion on joints  . predniSONE (DELTASONE) 20 MG tablet Take 3 tablets (60 mg total) by mouth daily for 5 days.  Marland Kitchen PRESCRIPTION MEDICATION Inject 1 application into the skin every 14 (fourteen) days. Allergy injections  . Probiotic Product (ALIGN) 4 MG CAPS Take 4 mg by mouth daily.   . psyllium (METAMUCIL) 58.6 %  powder Take 2 packets by mouth 2 (two) times daily.   . VENTOLIN HFA 108 (90 Base) MCG/ACT inhaler Inhale 1-2 puffs into the lungs as needed for wheezing or shortness of breath.   . vitamin C (ASCORBIC ACID) 500 MG tablet Take 500 mg by mouth daily.  Marland Kitchen zolpidem (AMBIEN) 10 MG tablet Take 10 mg by mouth at bedtime as needed for sleep.    Facility-Administered Encounter Medications as of 07/16/2020  Medication  . 0.9 %  sodium chloride infusion    Allergies as of 07/16/2020  . (No Known Allergies)    Past Medical History:  Diagnosis Date  . Aortic atherosclerosis ([place])   . Arthritis   . Asthma   . Diverticulitis   . Diverticulosis   . DVT (deep venous thrombosis) ([place])    right  . Ectatic thoracic aorta ([place])    a. 4.3cm by CT 03/2018.  Marland Kitchen GERD (gastroesophageal reflux disease)   . Hemorrhoids   . History of echocardiogram    a. Echo 10/16 [facility]):  EF 55%, trace MR, mild TR, mild to mod AI, mild dilated Ao root (41 mm)  . Hyperlipemia   . Organic erectile dysfunction   . PAF (paroxysmal atrial fibrillation) ([place])    a. admx to [facility] in [place], [place] 10/16 with AF with RVR >> converted to NSR with IV Dilt;  b. Flecainide started >> ETT neg for pro-arrhythmia   . Prostate cancer ([place]) 04/25/2006   Gleason 3+4=7  . Prostatitis   . S/P cardiac cath    a. LHC at [facility] 10/16:  LM ok, mLAD 30%, LCx ok, dRCA 20%  . S/P radiation therapy 01/19/2014 through 03/05/2014                                                      Prostate bed 6600 cGy in 33 sessions                          . Torn rotator cuff    right  40% tear    Past Surgical History:  Procedure Laterality Date  . ABLATION OF DYSRHYTHMIC FOCUS  03/28/2018  . APPENDECTOMY  1967  . ATRIAL FIBRILLATION ABLATION N/A 03/28/2018   Procedure: ATRIAL FIBRILLATION ABLATION;  Surgeon: [name], MD;  Location: [place] [facility];  Service: Cardiovascular;  Laterality: N/A;  . ATRIAL FIBRILLATION  ABLATION N/A 11/11/2019   Procedure: ATRIAL FIBRILLATION ABLATION;  Surgeon: [name], MD;  Location: [place] [facility];  Service: Cardiovascular;  Laterality: N/A;  . CATARACT EXTRACTION W/ INTRAOCULAR LENS IMPLANT Left 11/2011  . CATARACT EXTRACTION W/ INTRAOCULAR LENS IMPLANT Right 09/2015  . COLONOSCOPY    .  LARYNX SURGERY  2000   vocal cord lesion- benign  . LUMBAR LAMINECTOMY/DECOMPRESSION MICRODISCECTOMY Right 07/26/2017   Procedure: RIGHT LUMBAR 3- LUMBAR 4 FORAMINOTOMY WITH RESECTION OF SYNOVIAL CYST;  Surgeon: Erline Levine, MD;  Location: [facility];  Service: Neurosurgery;  Laterality: Right;  RIGHT LUMBAR 3- LUMBAR 4 FORAMINOTOMY WITH RESECTION OF SYNOVIAL CYST  . MENISCUS REPAIR Right   . POLYPECTOMY    . PROSTATE BIOPSY  1999  . PROSTATECTOMY  04/25/2006    Family History  Problem Relation Age of Onset  . Emphysema Father   . Lung cancer Father   . Hypertension Mother   . Heart disease Mother        heart attack  . Arthritis Mother   . Diabetes Maternal Grandmother   . Heart disease Maternal Grandfather        Died age 80  . Liver cancer Paternal Grandmother   . Heart disease Paternal Grandfather        Died age 36  . Atrial fibrillation Son   . Stomach cancer Neg Hx   . Rectal cancer Neg Hx   . Esophageal cancer Neg Hx   . Colon cancer Neg Hx     Social History   Socioeconomic History  . Marital status: Married    Spouse name: Not on file  . Number of children: 2  . Years of education: Not on file  . Highest education level: Not on file  Occupational History  . 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 Occupation: Optometrist  Tobacco Use  . Smoking status: Former Smoker    Packs/day: 1.00    Years: 10.00    Pack years: 10.00    Types: Cigarettes    Quit date: 1972    Years since quitting: 50.3  . Smokeless tobacco: Never Used  . Tobacco comment: 1972  Vaping Use  . Vaping Use: Never used  Substance and Sexual Activity  . Alcohol use: Yes    Alcohol/week:  7.0 standard drinks    Types: 7 Glasses of wine per week    Comment: 2  glasses wine or beer weekly  . Drug use: No  . Sexual activity: Not on file  Other Topics Concern  . Not on file  Social History Narrative   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  Consultant for schools.   Travels over seas    Social Determinants of Health   Financial Resource Strain: Not on file  Food Insecurity: Not on file  Transportation Needs: Not on file  Physical Activity: Not on file  Stress: Not on file  Social Connections: Not on file  Intimate Partner Violence: Not on file    Review of systems: Review of Systems  Constitutional: Negative for fever and chills.  HENT: Negative.   Eyes: Negative for blurred vision.  Respiratory: as per HPI  Cardiovascular: Negative for chest pain and palpitations.  Gastrointestinal: Negative for vomiting, diarrhea, blood per rectum. Genitourinary: Negative for dysuria, urgency, frequency and hematuria.  Musculoskeletal: Negative for myalgias, back pain and joint pain.  Skin: Negative for itching and rash.  Neurological: Negative for dizziness, tremors, focal weakness, seizures and loss of consciousness.  Endo/Heme/Allergies: Negative for environmental allergies.  Psychiatric/Behavioral: Negative for depression, suicidal ideas and hallucinations.  All other systems reviewed and are negative.  Physical Exam: Blood pressure 138/78, pulse 69, height 5\' 10"  (1.778 m), weight 215 lb (97.5 kg), SpO2 99 %. Gen:      No acute distress HEENT:  EOMI, sclera anicteric Neck:  No masses; no thyromegaly Lungs:    Clear to auscultation bilaterally; normal respiratory effort CV:         Regular rate and rhythm; no murmurs Abd:      + bowel sounds; soft, non-tender; no palpable masses, no distension Ext:    No edema; adequate peripheral perfusion Skin:      Warm and dry; no rash Neuro: alert and oriented x 3 Psych: normal mood and affect  Data Reviewed: Imaging: Chest x-ray  07/09/2020- minimal basal atelectasis Chest x-ray 07/11/2020- patchy bibasilar opacities left greater than right I have reviewed the images personally.  PFTs:  Labs:  Assessment:  Community acquired pneumonia Has been treated with amoxicillin and doxycycline but continues to be symptomatic with persistent cough  We will treat him with levofloxacin 750 mg/day for 7 days Prednisone 40 mg a day for 7 days  He continues to treat postnasal drip with Flonase.  At chlorpheniramine Continue PPI.  He is already on a high dose of 20 mg twice daily He already has Hycodan for cough  Asthma Continue steroid inhaler and albuterol as needed  Plan/Recommendations: Levofloxacin, prednisone for 7 days Continue PPI, Flonase, Add chlorpheniramine Continue Hycodan  Marshell Garfinkel MD [facility] 07/16/2020, 8:34 AM  CC: Leanna Battles, MD

## 2020-07-16 NOTE — Patient Instructions (Signed)
We will start you on a medication called levofloxacin 750 mg once a day for 7 days Prednisone 40 mg/day for 7 days  Continue your antiacid medication and Flonase Continue Hycodan for cough Start chlorpheniramine 4 mg 2-3 times a day for postnasal drip Continue Prilosec twice daily for acid reflux  Follow-up in 1 to 2 months.

## 2020-07-21 ENCOUNTER — Telehealth (HOSPITAL_COMMUNITY): Payer: Self-pay | Admitting: *Deleted

## 2020-07-21 NOTE — Telephone Encounter (Addendum)
Patient called in stating he is being treated for the past 3 weeks with pneumonia. He is currently on levaquin and high dose prednisone taper; he is currently in AF - started around [place] - he took 30mg  of cardizem last around 2am. HR is currently 155.  Instructed pt to take a 30mg  of cardizem and he will call with update this afternoon. Pt in agreement.

## 2020-07-21 NOTE — Telephone Encounter (Signed)
Pt continues in AF. Heart rates in the 150s BP 124/80. Discussed with Roderic Palau NP will try resuming 180mg  of cardizem daily - he will call with an update tomorrow.

## 2020-07-22 ENCOUNTER — Encounter (HOSPITAL_COMMUNITY): Payer: Self-pay | Admitting: Nurse Practitioner

## 2020-07-22 ENCOUNTER — Ambulatory Visit (HOSPITAL_COMMUNITY)
Admission: RE | Admit: 2020-07-22 | Discharge: 2020-07-22 | Disposition: A | Discharge status: Home / Self Care | Payer: Medicare Other | Source: Ambulatory Visit | Attending: Nurse Practitioner | Admitting: Nurse Practitioner

## 2020-07-22 ENCOUNTER — Other Ambulatory Visit: Payer: Self-pay

## 2020-07-22 VITALS — BP 112/70 | HR 96 | Ht 70.0 in | Wt 207.0 lb

## 2020-07-22 DIAGNOSIS — Z87891 Personal history of nicotine dependence: Secondary | ICD-10-CM | POA: Diagnosis not present

## 2020-07-22 DIAGNOSIS — I4891 Unspecified atrial fibrillation: Secondary | ICD-10-CM | POA: Diagnosis not present

## 2020-07-22 DIAGNOSIS — D6869 Other thrombophilia: Secondary | ICD-10-CM

## 2020-07-22 DIAGNOSIS — I493 Ventricular premature depolarization: Secondary | ICD-10-CM | POA: Diagnosis not present

## 2020-07-22 DIAGNOSIS — Z7901 Long term (current) use of anticoagulants: Secondary | ICD-10-CM | POA: Diagnosis not present

## 2020-07-22 DIAGNOSIS — I480 Paroxysmal atrial fibrillation: Secondary | ICD-10-CM | POA: Diagnosis not present

## 2020-07-22 MED ORDER — DILTIAZEM HCL ER COATED BEADS 180 MG PO CP24: 180.0000 mg | ORAL_CAPSULE | Freq: Every day | ORAL | Status: DC

## 2020-07-22 NOTE — Progress Notes (Signed)
Primary Care Physician: Leanna Battles, MD Referring Physician:Dr. Geroge Baseman Brandon Simmons. is a 75 y.o. male with a h/o paroxysmal afib that is in the afib clinic for onset afib last pm.  He  had ablation in 03/2018. He had to go back on daily flecainide and Cardizem as he had some stuttering afib after trying to stop antiarrythmic at 3 month f/u.  He has enjoyed sinus rhythm since being back on daily meds until last night. He also had Rocky Spotted Tick fever within the last few weeks and was treated with doxycycline. He had a tooth pulled 2/2 to infection last week and just finished amoxicillin. He stopped anticoagulation for 1 day  then resumed Friday. He is also having new onset of pain in his rt leg that his PCP does not think is related to tick bite but possibly 2/2 back issues. He tried an extra 30 mg Cardizem last night but felt like it bottomed out his BP.   F/u in afib clinic, 11/17/19. He had a repeat ablation last week for increase in afib burden. Over the weekend, he felt weak and lightheaded. Yesterday, he did not feel weill with HR's in the 40's to the 70's. He did not feel this to be afib but would note several regular bests then a pause in his  heart rhythm. He felt  lightheaded with this. When this was happening  he also felt "hollow" in his chest. Today feels better and EKG shows NSR. His flecainide and CCB was stopped at time of ablation.   F/u in afib clinic 12/17/19, one month s/p ablation. He feels great. No afib to appreciate. Had palpitations early on after ablation that sounded more like PC's, these have resolved. No swallowing or groin issues.   F/u in afib clinic, 07/22/20. He is being seen urgently as he went out of rhythm yesterday with v rates around 150 bpm. . He has been sick for the last 3 weeks with pneumonia and just a few days ago was placed on another  antibiotic and prednisone. He was placed back on diltiazem 180 mg daily yesterday pm and this am he felt as  though he may be back in rhythm for short periods of time. I feel the infection and prednisone may have been the trigger and may self convert when he is over the meds/infections. He remains on eliquis 5 mg bid for a CHA2DS2VASc of 2.                              Today, he denies symptoms of palpitations, chest pain, shortness of breath, orthopnea, PND, lower extremity edema, dizziness, presyncope, syncope, or neurologic sequela. The patient is tolerating medications without difficulties and is otherwise without complaint today.   Past Medical History:  Diagnosis Date  . Aortic atherosclerosis ([place])   . Arthritis   . Asthma   . Diverticulitis   . Diverticulosis   . DVT (deep venous thrombosis) ([place])    right  . Ectatic thoracic aorta ([place])    a. 4.3cm by CT 03/2018.  Marland Kitchen GERD (gastroesophageal reflux disease)   . Hemorrhoids   . History of echocardiogram    a. Echo 10/16 [facility]):  EF 55%, trace MR, mild TR, mild to mod AI, mild dilated Ao root (41 mm)  . Hyperlipemia   . Organic erectile dysfunction   . PAF (paroxysmal atrial fibrillation) ([place])  a. admx to [facility] in [place], [place] 10/16 with AF with RVR >> converted to NSR with IV Dilt;  b. Flecainide started >> ETT neg for pro-arrhythmia   . Prostate cancer ([place]) 04/25/2006   Gleason 3+4=7  . Prostatitis   . S/P cardiac cath    a. LHC at [facility] 10/16:  LM ok, mLAD 30%, LCx ok, dRCA 20%  . S/P radiation therapy 01/19/2014 through 03/05/2014                                                      Prostate bed 6600 cGy in 33 sessions                          . Torn rotator cuff    right  40% tear   Past Surgical History:  Procedure Laterality Date  . ABLATION OF DYSRHYTHMIC FOCUS  03/28/2018  . APPENDECTOMY  1967  . ATRIAL FIBRILLATION ABLATION N/A 03/28/2018   Procedure: ATRIAL FIBRILLATION ABLATION;  Surgeon: [name], MD;  Location: [facility];  Service: Cardiovascular;  Laterality: N/A;  .  ATRIAL FIBRILLATION ABLATION N/A 11/11/2019   Procedure: ATRIAL FIBRILLATION ABLATION;  Surgeon: [name], MD;  Location: [place] [facility];  Service: Cardiovascular;  Laterality: N/A;  . CATARACT EXTRACTION W/ INTRAOCULAR LENS IMPLANT Left 11/2011  . CATARACT EXTRACTION W/ INTRAOCULAR LENS IMPLANT Right 09/2015  . COLONOSCOPY    . LARYNX SURGERY  2000   vocal cord lesion- benign  . LUMBAR LAMINECTOMY/DECOMPRESSION MICRODISCECTOMY Right 07/26/2017   Procedure: RIGHT LUMBAR 3- LUMBAR 4 FORAMINOTOMY WITH RESECTION OF SYNOVIAL CYST;  Surgeon: Erline Levine, MD;  Location: [facility];  Service: Neurosurgery;  Laterality: Right;  RIGHT LUMBAR 3- LUMBAR 4 FORAMINOTOMY WITH RESECTION OF SYNOVIAL CYST  . MENISCUS REPAIR Right   . POLYPECTOMY    . PROSTATE BIOPSY  1999  . PROSTATECTOMY  04/25/2006    Current Outpatient Medications  Medication Sig Dispense Refill  . acetaminophen (TYLENOL) 500 MG tablet Take 500 mg by mouth at bedtime.     Marland Kitchen apixaban (ELIQUIS) 5 MG TABS tablet Take 5 mg by mouth 2 (two) times daily.     Marland Kitchen atorvastatin (LIPITOR) 40 MG tablet Take 20 mg by mouth daily.     . cholecalciferol (VITAMIN D) 1000 UNITS tablet Take 1,000 Units by mouth daily.    . diclofenac sodium (VOLTAREN) 1 % GEL Apply 2 g topically in the morning and at bedtime.     Marland Kitchen diltiazem (CARDIZEM CD) 180 MG 24 hr capsule Take 1 capsule (180 mg total) by mouth daily.    Marland Kitchen diltiazem (CARDIZEM) 30 MG tablet TAKE 1 TABLET EVERY 4 HOURS AS NEEDED FOR AFIB RAPID HEART RATE OVER 100 90 tablet 1  . EPINEPHrine 0.3 mg/0.3 mL IJ SOAJ injection Inject 0.3 mg into the muscle as needed for anaphylaxis.    . fluticasone (FLONASE) 50 MCG/ACT nasal spray Place 1 spray into both nostrils at bedtime.     . Glucosamine-Chondroit-Vit C-Mn (GLUCOSAMINE 1500 COMPLEX PO) Take 1,500 mg by mouth in the morning.     Marland Kitchen HYDROcodone-acetaminophen (NORCO/VICODIN) 5-325 MG tablet Take 0.5 tablets by mouth as needed for moderate pain (When  traveling).     Marland Kitchen HYDROcodone-homatropine (HYCODAN) 5-1.5 MG/5ML syrup Take  5 mLs by mouth every 6 (six) hours as needed for cough. 120 mL 0  . L-Lysine 500 MG TABS Take 500 mg by mouth in the morning.     Marland Kitchen levofloxacin (LEVAQUIN) 750 MG tablet Take 1 tablet (750 mg total) by mouth daily. 7 tablet 0  . LORazepam (ATIVAN) 1 MG tablet Take 1 mg by mouth at bedtime as needed for sleep (When traveling).     . methocarbamol (ROBAXIN) 500 MG tablet Take 1 tablet (500 mg total) by mouth every 6 (six) hours as needed for muscle spasms. 60 tablet 1  . Mometasone Furoate 100 MCG/ACT AERO Inhale 1 puff into the lungs at bedtime.     . montelukast (SINGULAIR) 10 MG tablet Take 10 mg by mouth at bedtime.    . Multiple Vitamin (MULTIVITAMIN) capsule Take 1 capsule by mouth daily.    . Naphazoline-Pheniramine (OPCON-A) 0.027-0.315 % SOLN Place 1 drop into both eyes daily as needed (allergies).     Marland Kitchen omeprazole (PRILOSEC) 20 MG capsule Take 20 mg by mouth 2 (two) times daily before a meal.     . OVER THE COUNTER MEDICATION Apply 1 application topically at bedtime. CBD lotion on joints    . predniSONE (DELTASONE) 20 MG tablet Take 40mg  daily for 7 days 14 tablet 0  . PRESCRIPTION MEDICATION Inject 1 application into the skin every 14 (fourteen) days. Allergy injections    . Probiotic Product (ALIGN) 4 MG CAPS Take 4 mg by mouth daily.     . psyllium (METAMUCIL) 58.6 % powder Take 2 packets by mouth 2 (two) times daily.     . VENTOLIN HFA 108 (90 Base) MCG/ACT inhaler Inhale 1-2 puffs into the lungs as needed for wheezing or shortness of breath.     . vitamin C (ASCORBIC ACID) 500 MG tablet Take 500 mg by mouth daily.    Marland Kitchen zolpidem (AMBIEN) 10 MG tablet Take 10 mg by mouth at bedtime as needed for sleep.      Current Facility-Administered Medications  Medication Dose Route Frequency Provider Last Rate Last Admin  . 0.9 %  sodium chloride infusion  500 mL Intravenous Once [place], MD        No Known  Allergies  Social History   Socioeconomic History  . Marital status: Married    Spouse name: Not on file  . Number of children: 2  . Years of education: Not on file  . Highest education level: Not on file  Occupational History  . 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 Occupation: Optometrist  Tobacco Use  . Smoking status: Former Smoker    Packs/day: 1.00    Years: 10.00    Pack years: 10.00    Types: Cigarettes    Quit date: 1972    Years since quitting: 50.3  . Smokeless tobacco: Never Used  . Tobacco comment: 1972  Vaping Use  . Vaping Use: Never used  Substance and Sexual Activity  . Alcohol use: Yes    Alcohol/week: 7.0 standard drinks    Types: 7 Glasses of wine per week    Comment: 2  glasses wine or beer weekly  . Drug use: No  . Sexual activity: Not on file  Other Topics Concern  . Not on file  Social History Narrative   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  Consultant for schools.   Travels over seas    Social Determinants of Health   Financial Resource Strain: Not on file  Food Insecurity: Not  on file  Transportation Needs: Not on file  Physical Activity: Not on file  Stress: Not on file  Social Connections: Not on file  Intimate Partner Violence: Not on file    Family History  Problem Relation Age of Onset  . Emphysema Father   . Lung cancer Father   . Hypertension Mother   . Heart disease Mother        heart attack  . Arthritis Mother   . Diabetes Maternal Grandmother   . Heart disease Maternal Grandfather        Died age 73  . Liver cancer Paternal Grandmother   . Heart disease Paternal Grandfather        Died age 60  . Atrial fibrillation Son   . Stomach cancer Neg Hx   . Rectal cancer Neg Hx   . Esophageal cancer Neg Hx   . Colon cancer Neg Hx     ROS- All systems are reviewed and negative except as per the HPI above  Physical Exam: Vitals:   07/22/20 1033  BP: 112/70  Pulse: 96  Weight: 93.9 kg  Height: 5\' 10"  (1.778 m)   Wt Readings from  Last 3 Encounters:  07/22/20 93.9 kg  07/16/20 97.5 kg  07/09/20 95.3 kg    Labs: Lab Results  Component Value Date   NA 130 (L) 07/11/2020   K 3.4 (L) 07/11/2020   CL 99 07/11/2020   CO2 23 07/11/2020   GLUCOSE 141 (H) 07/11/2020   BUN 12 07/11/2020   CREATININE 0.91 07/11/2020   CALCIUM 8.5 (L) 07/11/2020   MG 2.2 02/11/2018   Lab Results  Component Value Date   INR 1.04 02/11/2018   No results found for: CHOL, HDL, LDLCALC, TRIG   GEN- The patient is well appearing, alert and oriented x 3 today.   Head- normocephalic, atraumatic Eyes-  Sclera clear, conjunctiva pink Ears- hearing intact Oropharynx- clear Neck- supple, no JVP Lymph- no cervical lymphadenopathy Lungs- Clear to ausculation bilaterally, normal work of breathing Heart-irregular rate and rhythm, no murmurs, rubs or gallops, PMI not laterally displaced GI- soft, NT, ND, + BS Extremities- no clubbing, cyanosis, or edema MS- no significant deformity or atrophy Skin- no rash or lesion Psych- euthymic mood, full affect Neuro- strength and sensation are intact  EKG- afib at 96 bpm,  qrs int 96 ms, qtc 421 ms     Assessment and Plan: 1. Paroxysmal afib S/p ablation 03/2018 and repeat ablation 11/11/19 Had been maintaining  SR until recent Pneumonia/prednisone taper.  Probably the  trigger   I have restarted on Cardizem 180 mg qd, he is now rate controlled and pt feels he may  be going in and out since this am     2. Chadsvasc score of 3  Continue  eliquis 5 mg bid    F/u in 2 weeks   Brandon Simmons, [facility] [address] [place], [place] [id number] [phone]

## 2020-07-22 NOTE — Telephone Encounter (Signed)
Received the following message from patient:  "Thank you so much, Dr. Vaughan Browner, for following up with me today with your call.  Adding insult to injury, I began some major AFib episodes around midnight night before last. I am an AFib patient, so this wasn't totally unexpected, both because of the trauma of pneumonia (and it has indeed be a trauma for me!) and because of the Prednisone treatment for it. I was at the [facility]ib Clinic when you called. Having said all of this, I am slowly getting back in shape. I am sleeping well, although I rely still on the Hydrocodone-homatropine to control my cough at night (I do not take it during the day.) I don't have much energy yet, and the AFib is exacerbating that as well.  But I still am so much better than when I saw you. I will take my last levofloxacin this evening, and will finish my Prednisone next Tuesday (at the recommendation of the AFib Clinic, I actually am tapering that for a couple of days after I take my last 40 mg dose tomorrow.) So, I am on the mend, and I feel in very good hands with you. Thank you so much. I will see you for a televisit on June 13. Brandon Simmons"

## 2020-07-22 NOTE — Patient Instructions (Signed)
Continue cardizem 180mg  once a day

## 2020-07-23 DIAGNOSIS — J181 Lobar pneumonia, unspecified organism: Secondary | ICD-10-CM | POA: Diagnosis not present

## 2020-07-29 ENCOUNTER — Other Ambulatory Visit (HOSPITAL_COMMUNITY): Payer: Self-pay | Admitting: *Deleted

## 2020-07-29 ENCOUNTER — Telehealth (HOSPITAL_COMMUNITY): Payer: Self-pay | Admitting: *Deleted

## 2020-07-29 NOTE — Telephone Encounter (Signed)
Patient continues in AF and is miserable would like to go ahead and schedule cardioversion as he has "never been in AF this long."   Discussed with Brandon Palau NP ok to go ahead and schedule. Cardioversion for 5/17 at 830am. NPO after MN no missed doses of eliquis.  Ride needed at discharge. Pt verbalized agreement.

## 2020-08-02 ENCOUNTER — Other Ambulatory Visit (HOSPITAL_COMMUNITY)
Admission: RE | Admit: 2020-08-02 | Discharge: 2020-08-02 | Disposition: A | Discharge status: Home / Self Care | Payer: Medicare Other | Source: Ambulatory Visit | Attending: Cardiology | Admitting: Cardiology

## 2020-08-02 ENCOUNTER — Ambulatory Visit (HOSPITAL_COMMUNITY)
Admission: RE | Admit: 2020-08-02 | Discharge: 2020-08-02 | Disposition: A | Discharge status: Home / Self Care | Payer: Medicare Other | Source: Ambulatory Visit | Attending: Physician Assistant | Admitting: Physician Assistant

## 2020-08-02 ENCOUNTER — Other Ambulatory Visit: Payer: Self-pay

## 2020-08-02 DIAGNOSIS — Z20822 Contact with and (suspected) exposure to covid-19: Secondary | ICD-10-CM | POA: Diagnosis not present

## 2020-08-02 DIAGNOSIS — I4891 Unspecified atrial fibrillation: Secondary | ICD-10-CM | POA: Insufficient documentation

## 2020-08-02 DIAGNOSIS — Z01818 Encounter for other preprocedural examination: Secondary | ICD-10-CM | POA: Diagnosis not present

## 2020-08-02 DIAGNOSIS — I480 Paroxysmal atrial fibrillation: Secondary | ICD-10-CM

## 2020-08-02 LAB — SARS CORONAVIRUS 2 (TAT 6-24 HRS): SARS Coronavirus 2: NEGATIVE

## 2020-08-02 MED ORDER — DILTIAZEM HCL ER COATED BEADS 180 MG PO CP24: 180.0000 mg | ORAL_CAPSULE | Freq: Every day | ORAL | 3 refills | Status: DC

## 2020-08-02 NOTE — Progress Notes (Signed)
Patient returns for ECG today. ECG shows SR HR 63, PR 174, QRS 98, QTc 399. Will cancel DCCV. Continue diltiazem 180 mg daily. F/u in the AF clinic in 3 months.

## 2020-08-03 ENCOUNTER — Encounter (HOSPITAL_COMMUNITY): Admission: RE | Payer: Self-pay | Source: Home / Self Care

## 2020-08-03 ENCOUNTER — Ambulatory Visit (HOSPITAL_COMMUNITY): Admission: RE | Admit: 2020-08-03 | Payer: Medicare Other | Source: Home / Self Care | Admitting: Cardiology

## 2020-08-03 SURGERY — CARDIOVERSION
Anesthesia: Monitor Anesthesia Care

## 2020-08-09 ENCOUNTER — Other Ambulatory Visit (HOSPITAL_COMMUNITY): Payer: Medicare Other

## 2020-08-09 ENCOUNTER — Encounter (HOSPITAL_COMMUNITY): Admission: RE | Admit: 2020-08-09 | Payer: Medicare Other | Source: Ambulatory Visit

## 2020-08-12 ENCOUNTER — Ambulatory Visit (HOSPITAL_COMMUNITY): Payer: Medicare Other | Admitting: Nurse Practitioner

## 2020-08-13 DIAGNOSIS — J3089 Other allergic rhinitis: Secondary | ICD-10-CM | POA: Diagnosis not present

## 2020-08-13 DIAGNOSIS — J301 Allergic rhinitis due to pollen: Secondary | ICD-10-CM | POA: Diagnosis not present

## 2020-08-19 NOTE — Patient Instructions (Addendum)
DUE TO COVID-19 ONLY ONE VISITOR IS ALLOWED TO COME WITH YOU AND STAY IN THE WAITING ROOM ONLY DURING PRE OP AND PROCEDURE DAY OF SURGERY. THE 1 VISITOR  MAY VISIT WITH YOU AFTER SURGERY IN YOUR PRIVATE ROOM DURING VISITING HOURS ONLY!                 [facility].   Your procedure is scheduled on: 09/02/20   Report to [facility] Main  Entrance   Report to Short stay at 5:15 AM     Call this number if you have problems the morning of surgery [place], NO CHEWING GUM [place].  No food after midnight.    You may have clear liquid until 4:30 AM.    At 4:00 AM drink pre surgery drink.   Nothing by mouth after 4:30 AM .  Take these medicines the morning of surgery with A SIP OF WATER: Diltiazem, Omeprazole, Flecainide,  Use your inhalers and bring them  with you.                                You may not have any metal on your body including               piercings  Do not wear jewelry,  lotions, powders or  deodorant              Men may shave face and neck.   Do not bring valuables to the hospital. [place].  Contacts, dentures or bridgework may not be worn into surgery.      Patients discharged the day of surgery will not be allowed to drive home.  IF YOU ARE HAVING SURGERY AND GOING HOME THE SAME DAY, YOU MUST HAVE AN ADULT TO DRIVE YOU HOME AND BE WITH YOU FOR 24 HOURS. YOU MAY GO HOME BY TAXI OR UBER OR ORTHERWISE, BUT AN ADULT MUST ACCOMPANY YOU HOME AND STAY WITH YOU FOR 24 HOURS.  Name and phone number of your driver:  Special Instructions: N/A              Please read over the following fact sheets you were given: _____________________________________________________________________             [facility]- Preparing for Total Shoulder Arthroplasty    Before surgery, you can play an important role. Because skin is not  sterile, your skin needs to be as free of germs as possible. You can reduce the number of germs on your skin by using the following products. . Benzoyl Peroxide Gel o Reduces the number of germs present on the skin o Applied twice a day to shoulder area starting two days before surgery    ==================================================================  Please follow these instructions carefully:  BENZOYL PEROXIDE 5% GEL  Please do not use if you have an allergy to benzoyl peroxide.   If your skin becomes reddened/irritated stop using the benzoyl peroxide.  Starting two days before surgery, apply as follows: 1. Apply benzoyl peroxide in the morning and at night. Apply after taking a shower. 2.  If you are not taking a shower clean entire shoulder front, back, and side along with the armpit with a clean wet washcloth.  3. Place a quarter-sized dollop on your shoulder and rub in thoroughly, making sure to cover the front, back, and side of your shoulder, along with the armpit.   2 days before ____ AM   ____ PM              1 day before ____ AM   ____ PM                         4. Do this twice a day for two days.  (Last application is the night before surgery, AFTER using the CHG soap as described below).  5. Do NOT apply benzoyl peroxide gel on the day of surgery.   [place] - Preparing for Surgery Before surgery, you can play an important role.  Because skin is not sterile, your skin needs to be as free of germs as possible.  You can reduce the number of germs on your skin by washing with CHG (chlorahexidine gluconate) soap before surgery.  CHG is an antiseptic cleaner which kills germs and bonds with the skin to continue killing germs even after washing. Please DO NOT use if you have an allergy to CHG or antibacterial soaps.  If your skin becomes reddened/irritated stop using the CHG and inform your nurse when you arrive at Short Stay.   You may shave your face/neck.  Please  follow these instructions carefully:   1.  Shower with CHG Soap the night before surgery and the  morning of Surgery.  2.  If you choose to wash your hair, wash your hair first as usual with your  normal  shampoo.  3.  After you shampoo, rinse your hair and body thoroughly to remove the  shampoo.                                        4.  Use CHG as you would any other liquid soap.  You can apply chg directly  to the skin and wash                       Gently with a scrungie or clean washcloth.  5.  Apply the CHG Soap to your body ONLY FROM THE NECK DOWN.   Do not use on face/ open                           Wound or open sores. Avoid contact with eyes, ears mouth and genitals (private parts).                       Wash face,  Genitals (private parts) with your normal soap.             6.  Wash thoroughly, paying special attention to the area where your surgery  will be performed.  7.  Thoroughly rinse your body with warm water from the neck down.  8.  DO NOT shower/wash with your normal soap after using and rinsing off  the CHG Soap.             9.  Pat yourself dry with a clean towel.            10.  Wear clean pajamas.            11.  Place clean sheets on your bed the night of your first shower and do not  sleep with pets. Day of Surgery : Do not apply any lotions/deodorants the morning of surgery.  Please wear clean clothes to the hospital/surgery center.  FAILURE TO FOLLOW THESE INSTRUCTIONS MAY RESULT IN THE CANCELLATION OF YOUR SURGERY PATIENT SIGNATURE_________________________________  NURSE SIGNATURE__________________________________  ________________________________________________________________________   Adam Phenix  An incentive spirometer is a tool that can help keep your lungs clear and active. This tool measures how well you are filling your lungs with each breath. Taking long deep breaths may help reverse or decrease the chance of developing breathing  (pulmonary) problems (especially infection) following:  A long period of time when you are unable to move or be active. BEFORE THE PROCEDURE   If the spirometer includes an indicator to show your best effort, your nurse or respiratory therapist will set it to a desired goal.  If possible, sit up straight or lean slightly forward. Try not to slouch.  Hold the incentive spirometer in an upright position. INSTRUCTIONS FOR USE  1. Sit on the edge of your bed if possible, or sit up as far as you can in bed or on a chair. 2. Hold the incentive spirometer in an upright position. 3. Breathe out normally. 4. Place the mouthpiece in your mouth and seal your lips tightly around it. 5. Breathe in slowly and as deeply as possible, raising the piston or the ball toward the top of the column. 6. Hold your breath for 3-5 seconds or for as long as possible. Allow the piston or ball to fall to the bottom of the column. 7. Remove the mouthpiece from your mouth and breathe out normally. 8. Rest for a few seconds and repeat Steps 1 through 7 at least 10 times every 1-2 hours when you are awake. Take your time and take a few normal breaths between deep breaths. 9. The spirometer may include an indicator to show your best effort. Use the indicator as a goal to work toward during each repetition. 10. After each set of 10 deep breaths, practice coughing to be sure your lungs are clear. If you have an incision (the cut made at the time of surgery), support your incision when coughing by placing a pillow or rolled up towels firmly against it. Once you are able to get out of bed, walk around indoors and cough well. You may stop using the incentive spirometer when instructed by your caregiver.  RISKS AND COMPLICATIONS  Take your time so you do not get dizzy or light-headed.  If you are in pain, you may need to take or ask for pain medication before doing incentive spirometry. It is harder to take a deep breath if you  are having pain. AFTER USE  Rest and breathe slowly and easily.  It can be helpful to keep track of a log of your progress. Your caregiver can provide you with a simple table to help with this. If you are using the spirometer at home, follow these instructions: [place] IF:   You are having difficultly using the spirometer.  You have trouble using the spirometer as often as instructed.  Your pain medication is not giving enough relief while using the spirometer.  You develop fever of 100.5 F (38.1 C) or higher. SEEK IMMEDIATE MEDICAL CARE IF:   You cough up bloody sputum that had not been present before.  You develop fever of 102 F (38.9 C) or greater.  You develop worsening pain at or near the incision site. MAKE SURE YOU:   Understand these instructions.  Will watch your condition.  Will get help right away if you are not doing well or get worse. Document Released: 07/17/2006 Document Revised: 05/29/2011 Document Reviewed: 09/17/2006 [facility] Patient Information 2014 [place], [place].   ________________________________________________________________________

## 2020-08-20 ENCOUNTER — Encounter (HOSPITAL_COMMUNITY)
Admission: RE | Admit: 2020-08-20 | Discharge: 2020-08-20 | Disposition: A | Discharge status: Home / Self Care | Payer: Medicare Other | Source: Ambulatory Visit | Attending: Orthopedic Surgery | Admitting: Orthopedic Surgery

## 2020-08-20 ENCOUNTER — Other Ambulatory Visit: Payer: Self-pay

## 2020-08-20 ENCOUNTER — Telehealth (HOSPITAL_COMMUNITY): Payer: Self-pay | Admitting: *Deleted

## 2020-08-20 ENCOUNTER — Encounter (HOSPITAL_COMMUNITY): Payer: Self-pay

## 2020-08-20 DIAGNOSIS — Z01812 Encounter for preprocedural laboratory examination: Secondary | ICD-10-CM | POA: Diagnosis not present

## 2020-08-20 HISTORY — DX: Cardiac arrhythmia, unspecified: I49.9

## 2020-08-20 MED ORDER — FLECAINIDE ACETATE 100 MG PO TABS: 100.0000 mg | ORAL_TABLET | Freq: Two times a day (BID) | ORAL | 3 refills | Status: DC

## 2020-08-20 NOTE — Telephone Encounter (Signed)
Patient called in stating he went into Af this morning at 2am. He took 2 doses of PRN cardizem HR now 150 BP 112/71. Discussed with Adline Peals PA will restart flecainide 100mg  BID with follow up next week for repeat EKG. Pt was to have his pre-op appt this morning but feels he cannot go for this. I encouraged pt to reach out to Dr. Susie Cassette office to let them know in AF this morning and has follow up next week. Pt in agreement.

## 2020-08-20 NOTE — Progress Notes (Signed)
COVID Vaccine Completed:Yes Date COVID Vaccine completed:04/29/19-booster 01/16/20, 06/24/20 COVID vaccine manufacturer:   Moderna     PCP - Dr. Threasa Beards Cardiologist - Dr. Clearnce Hasten  Chest x-ray - 07/11/20-epic EKG - 08/02/20-epic Stress Test - 02/08/15-epic ECHO - 08/24/18 -epic Cardiac Cath - 2016- had it done in [place] while travelling at the onset of his a-fib. Pacemaker/ICD device last checked:NA  Sleep Study - yes- negative results CPAP - no  Fasting Blood Sugar - NA Checks Blood Sugar _____ times a day  Blood Thinner Instructions:Eliquis/ Dr. Sharlett Iles Aspirin Instructions:Pt will call Dr. Sharlett Iles today and ask when to hold it for surgery Last Dose:  Anesthesia review:   Patient denies shortness of breath, fever, cough and chest pain at PAT appointment Yes. Pt works out regularly and his asthma is well controlled. He has no SOB with any activities. His PAT visit was by phone because of episode of a-fib with rate of 150. He saw the Dr. And meds were adjusted.  Patient verbalized understanding of instructions that were given to them at the PAT appointment. Patient was also instructed that they will need to review over the PAT instructions again at home before surgery.Yes

## 2020-08-24 ENCOUNTER — Encounter (HOSPITAL_COMMUNITY)
Admission: RE | Admit: 2020-08-24 | Discharge: 2020-08-24 | Disposition: A | Discharge status: Home / Self Care | Payer: Medicare Other | Source: Ambulatory Visit | Attending: Orthopedic Surgery | Admitting: Orthopedic Surgery

## 2020-08-24 ENCOUNTER — Other Ambulatory Visit: Payer: Self-pay

## 2020-08-24 DIAGNOSIS — I2510 Atherosclerotic heart disease of native coronary artery without angina pectoris: Secondary | ICD-10-CM | POA: Insufficient documentation

## 2020-08-24 DIAGNOSIS — Z791 Long term (current) use of non-steroidal anti-inflammatories (NSAID): Secondary | ICD-10-CM | POA: Insufficient documentation

## 2020-08-24 DIAGNOSIS — M19012 Primary osteoarthritis, left shoulder: Secondary | ICD-10-CM | POA: Diagnosis not present

## 2020-08-24 DIAGNOSIS — Z79899 Other long term (current) drug therapy: Secondary | ICD-10-CM | POA: Diagnosis not present

## 2020-08-24 DIAGNOSIS — Z86718 Personal history of other venous thrombosis and embolism: Secondary | ICD-10-CM | POA: Insufficient documentation

## 2020-08-24 DIAGNOSIS — Z01818 Encounter for other preprocedural examination: Secondary | ICD-10-CM | POA: Diagnosis not present

## 2020-08-24 DIAGNOSIS — I480 Paroxysmal atrial fibrillation: Secondary | ICD-10-CM | POA: Diagnosis not present

## 2020-08-24 DIAGNOSIS — Z7952 Long term (current) use of systemic steroids: Secondary | ICD-10-CM | POA: Insufficient documentation

## 2020-08-24 DIAGNOSIS — Z8546 Personal history of malignant neoplasm of prostate: Secondary | ICD-10-CM | POA: Insufficient documentation

## 2020-08-24 DIAGNOSIS — Z7901 Long term (current) use of anticoagulants: Secondary | ICD-10-CM | POA: Diagnosis not present

## 2020-08-24 DIAGNOSIS — R059 Cough, unspecified: Secondary | ICD-10-CM | POA: Diagnosis not present

## 2020-08-24 DIAGNOSIS — Z7951 Long term (current) use of inhaled steroids: Secondary | ICD-10-CM | POA: Insufficient documentation

## 2020-08-24 DIAGNOSIS — Z9079 Acquired absence of other genital organ(s): Secondary | ICD-10-CM | POA: Diagnosis not present

## 2020-08-24 DIAGNOSIS — J45909 Unspecified asthma, uncomplicated: Secondary | ICD-10-CM | POA: Diagnosis not present

## 2020-08-24 LAB — CBC
HCT: 41.8 % (ref 39.0–52.0)
Hemoglobin: 13.9 g/dL (ref 13.0–17.0)
MCH: 31.6 pg (ref 26.0–34.0)
MCHC: 33.3 g/dL (ref 30.0–36.0)
MCV: 95 fL (ref 80.0–100.0)
Platelets: 188 10*3/uL (ref 150–400)
RBC: 4.4 MIL/uL (ref 4.22–5.81)
RDW: 12.4 % (ref 11.5–15.5)
WBC: 6.9 10*3/uL (ref 4.0–10.5)
nRBC: 0 % (ref 0.0–0.2)

## 2020-08-24 LAB — SURGICAL PCR SCREEN
MRSA, PCR: NEGATIVE
Staphylococcus aureus: NEGATIVE

## 2020-08-24 LAB — BASIC METABOLIC PANEL
Anion gap: 4 — ABNORMAL LOW (ref 5–15)
BUN: 19 mg/dL (ref 8–23)
CO2: 28 mmol/L (ref 22–32)
Calcium: 9.5 mg/dL (ref 8.9–10.3)
Chloride: 108 mmol/L (ref 98–111)
Creatinine, Ser: 1.16 mg/dL (ref 0.61–1.24)
GFR, Estimated: >60 mL/min (ref >60)
Glucose, Bld: 131 mg/dL — ABNORMAL HIGH (ref 70–99)
Potassium: 3.7 mmol/L (ref 3.5–5.1)
Sodium: 140 mmol/L (ref 135–145)

## 2020-08-24 NOTE — Progress Notes (Addendum)
Pt in for pre op labs. EKG was done. Pt is NSR rate of 61.  Pt was told to hold eliquis for 2 days prior to DOS by Dr. Sharlett Iles Last dose will be 08/30/20.

## 2020-08-25 ENCOUNTER — Encounter (HOSPITAL_COMMUNITY): Payer: Self-pay

## 2020-08-25 ENCOUNTER — Ambulatory Visit (HOSPITAL_COMMUNITY): Payer: Medicare Other | Admitting: Nurse Practitioner

## 2020-08-25 NOTE — Progress Notes (Signed)
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 Thought to be due to recent pneumonia; diltiazem restarted. Follow up 08/02/20 with Clint Fenton, PA found pt in sinus rhythm. Pt called office 08/20/20 to report he was back in afib with HR 150, he took prn doses diltiazem; flecainide started. Had EKG 08/24/20 at pre-surgical testing that showed NSR.   Reviewed recent PAF episodes with Dr. Jenita Seashore  Pt to hold eliquis 2 days before surgery   VS: BP 131/72   Pulse 66   Temp 36.8 C (Oral)   Resp 20   Ht 5\' 10"  (1.778 m)   Wt 93.9 kg   SpO2 98%   BMI 29.70 kg/m   PROVIDERS: - PCP is Leanna Battles, MD - Cardiologist is [name], MD.   LABS: Labs reviewed: Acceptable for surgery. (all labs ordered are listed, but only abnormal results are displayed)  Labs Reviewed  BASIC METABOLIC PANEL - Abnormal; Notable for the following components:      Result Value   Glucose, Bld 131 (*)    Anion gap 4 (*)    All other components within normal limits  SURGICAL PCR SCREEN  CBC    IMAGES: 1 view CXR 07/11/20:  - Patchy bibasilar opacities, left greater than right, suspicious for pneumonia. This is new from prior exam   EKG 08/24/20: Normal sinus rhythm. Non-specific ST-t changes   CV: CT cardiac morphology 11/04/19:  - No significant non-cardiovascular abnormality seen in visualized portion of the thorax.  Echo 08/24/18:  1. The left ventricle has normal systolic  function with an ejection fraction of 60-65%. The cavity size was normal. Left ventricular diastolic Doppler parameters are consistent with pseudonormalization.   2. The right ventricle has normal systolic function. The cavity was normal. There is no increase in right ventricular wall thickness.   3. No evidence of mitral valve stenosis.   4. Aortic valve regurgitation is mild by color flow Doppler. No stenosis of the aortic valve.   5. There is mild dilatation of the aortic root and of the ascending aorta.   6. The interatrial septum was not assessed.   Cardiac cath 01/05/15 (found in media tab):  1. Right dominant coronary circulation 2. Large, short LM is normal 3. Large LAD and DA. 30% mid-LAD stenosis 4. Large LCx and OM1 are normal 5. Large RCA, rPL, and rPDA. 20% distal RCA stenosis only   Past Medical History:  Diagnosis Date   Aortic atherosclerosis ([place])    Arthritis    Asthma    Diverticulitis    Diverticulosis    DVT (deep venous thrombosis) (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 4.3cm by CT 03/2018.   GERD (gastroesophageal reflux disease)    Hemorrhoids    History of echocardiogram    a.  Echo 10/16 ([place]):  EF 55%, trace MR, mild TR, mild to mod AI, mild dilated Ao root (41 mm)   Hyperlipemia    Organic erectile dysfunction    PAF (paroxysmal atrial fibrillation) ([place])    a. admx to [facility] in [place], [place] 10/16 with AF with RVR >> converted to NSR with IV Dilt;  b. Flecainide started >> ETT neg for pro-arrhythmia    Pneumonia 06/2020   Prostate cancer ([place]) 04/25/2006   Gleason 3+4=7   Prostatitis    S/P cardiac cath    a. LHC at [facility] 10/16:  LM ok, mLAD 30%, LCx ok, dRCA 20%   S/P radiation therapy 01/19/2014 through 03/05/2014                                                      Prostate bed 6600 cGy in 33 sessions                           Torn  rotator cuff    right  40% tear    Past Surgical History:  Procedure Laterality Date   ABLATION OF DYSRHYTHMIC FOCUS  03/28/2018   APPENDECTOMY  1967   ATRIAL FIBRILLATION ABLATION N/A 03/28/2018   Procedure: ATRIAL FIBRILLATION ABLATION;  Surgeon: [name], MD;  Location: [place] CV LAB;  Service: Cardiovascular;  Laterality: N/A;   ATRIAL FIBRILLATION ABLATION N/A 11/11/2019   Procedure: ATRIAL FIBRILLATION ABLATION;  Surgeon: [name], MD;  Location: [place] CV LAB;  Service: Cardiovascular;  Laterality: N/A;   BACK SURGERY  2019   CARDIAC CATHETERIZATION  2016   [place]   CATARACT EXTRACTION W/ INTRAOCULAR LENS IMPLANT Left 11/2011   CATARACT EXTRACTION W/ INTRAOCULAR LENS IMPLANT Right 09/2015   COLONOSCOPY     LARYNX SURGERY  2000   vocal cord lesion- benign   LUMBAR LAMINECTOMY/DECOMPRESSION MICRODISCECTOMY Right 07/26/2017   Procedure: RIGHT LUMBAR 3- LUMBAR 4 FORAMINOTOMY WITH RESECTION OF SYNOVIAL CYST;  Surgeon: Erline Levine, MD;  Location: [facility];  Service: Neurosurgery;  Laterality: Right;  RIGHT LUMBAR 3- LUMBAR 4 FORAMINOTOMY WITH RESECTION OF SYNOVIAL CYST   MENISCUS REPAIR Right    POLYPECTOMY     PROSTATE BIOPSY  1999   PROSTATECTOMY  04/25/2006    MEDICATIONS:  acetaminophen (TYLENOL) 500 MG tablet   apixaban (ELIQUIS) 5 MG TABS tablet   atorvastatin (LIPITOR) 40 MG tablet   cholecalciferol (VITAMIN D) 1000 UNITS tablet   cyclobenzaprine (FLEXERIL) 10 MG tablet   diclofenac sodium (VOLTAREN) 1 % GEL   diltiazem (CARDIZEM CD) 180 MG 24 hr capsule   diltiazem (CARDIZEM) 30 MG tablet   EPINEPHrine 0.3 mg/0.3 mL IJ SOAJ injection   flecainide (TAMBOCOR) 100 MG tablet   fluticasone (FLONASE) 50 MCG/ACT nasal spray   Glucosamine-Chondroit-Vit C-Mn (GLUCOSAMINE 1500 COMPLEX PO)   HYDROcodone-homatropine (HYCODAN) 5-1.5 MG/5ML syrup   L-Lysine 500 MG TABS   levofloxacin (LEVAQUIN) 750 MG tablet   methocarbamol (ROBAXIN) 500 MG tablet   Mometasone  Furoate (ASMANEX HFA) 100 MCG/ACT AERO   montelukast (SINGULAIR) 10 MG tablet   Multiple Vitamin (MULTIVITAMIN WITH MINERALS) TABS tablet   Naphazoline-Pheniramine (OPCON-A) 0.027-0.315 % SOLN   NON FORMULARY   omeprazole (PRILOSEC) 20 MG capsule   predniSONE (DELTASONE) 20 MG  tablet   Probiotic Product (ALIGN) 4 MG CAPS   psyllium (METAMUCIL) 58.6 % powder   VENTOLIN HFA 108 (90 Base) MCG/ACT inhaler   vitamin C (ASCORBIC ACID) 500 MG tablet    0.9 %  sodium chloride infusion   - Pt to hold eliquis 2 days before surgery   If no changes, I anticipate pt can proceed with surgery as scheduled.   Willeen Cass, PhD, FNP-BC [facility] Short Stay Surgical Center/Anesthesiology Phone: [phone] 08/26/2020 9:41 AM

## 2020-08-26 ENCOUNTER — Other Ambulatory Visit: Payer: Self-pay

## 2020-08-26 NOTE — Anesthesia Preprocedure Evaluation (Addendum)
Anesthesia Evaluation  Patient identified by MRN, date of birth, ID band Patient awake    Reviewed: Allergy & Precautions, H&P , NPO status , Patient's Chart, lab work & pertinent test results  Airway Mallampati: III  TM Distance: >3 FB Neck ROM: Full    Dental no notable dental hx. (+) Teeth Intact, Dental Advisory Given   Pulmonary asthma , pneumonia, resolved, former smoker,    Pulmonary exam normal breath sounds clear to auscultation       Cardiovascular Exercise Tolerance: Good + dysrhythmias Atrial Fibrillation  Rhythm:Irregular Rate:Normal     Neuro/Psych negative neurological ROS  negative psych ROS   GI/Hepatic Neg liver ROS, GERD  Medicated,  Endo/Other  negative endocrine ROS  Renal/GU Renal disease  negative genitourinary   Musculoskeletal  (+) Arthritis ,   Abdominal   Peds  Hematology negative hematology ROS (+)   Anesthesia Other Findings   Reproductive/Obstetrics negative OB ROS                           Anesthesia Physical Anesthesia Plan  ASA: 2  Anesthesia Plan: General   Post-op Pain Management:  Regional for Post-op pain   Induction: Intravenous  PONV Risk Score and Plan: 3 and Ondansetron, Dexamethasone and Midazolam  Airway Management Planned: Oral ETT  Additional Equipment:   Intra-op Plan:   Post-operative Plan: Extubation in OR  Informed Consent: I have reviewed the patients History and Physical, chart, labs and discussed the procedure including the risks, benefits and alternatives for the proposed anesthesia with the patient or authorized representative who has indicated his/her understanding and acceptance.     Dental advisory given  Plan Discussed with: CRNA  Anesthesia Plan Comments: (See APP note by Durel Salts, FNP )      Anesthesia Quick Evaluation

## 2020-08-27 DIAGNOSIS — J301 Allergic rhinitis due to pollen: Secondary | ICD-10-CM | POA: Diagnosis not present

## 2020-08-27 DIAGNOSIS — J3089 Other allergic rhinitis: Secondary | ICD-10-CM | POA: Diagnosis not present

## 2020-08-27 NOTE — Telephone Encounter (Signed)
I just got the results today of my lung x-ray of 08/24/20, and wanted you to have a copy of it before our 9:15 appointment on Monday. So I dropped a copy of the Radiology Report off at your office early this afternoon. I forgot I could do that electronically with My Chart, so I am also attaching an electronic copy for you to cover my bases! I look forward to "seeing" you on Monday. Brandon Simmons  Dr. Vaughan Browner, please see mychart message, just FYI for Monday appt. Thanks!

## 2020-08-30 ENCOUNTER — Ambulatory Visit (INDEPENDENT_AMBULATORY_CARE_PROVIDER_SITE_OTHER): Payer: Medicare Other | Admitting: Pulmonary Disease

## 2020-08-30 ENCOUNTER — Institutional Professional Consult (permissible substitution): Payer: Medicare Other | Admitting: Pulmonary Disease

## 2020-08-30 ENCOUNTER — Other Ambulatory Visit: Payer: Self-pay

## 2020-08-30 DIAGNOSIS — J4530 Mild persistent asthma, uncomplicated: Secondary | ICD-10-CM

## 2020-08-30 DIAGNOSIS — J189 Pneumonia, unspecified organism: Secondary | ICD-10-CM

## 2020-08-30 NOTE — Patient Instructions (Signed)
I am glad you are doing well with your breathing and asthma is under control I have reviewed your chest x-ray with some improvement in bilateral infiltrates which is expected  I feel that you should be able to get the shoulder surgery scheduled for later this month I would advise you to get a follow-up chest x-ray in 1 to 2 months at her primary care office -Follow-up in clinic with me in 3 to 4 months

## 2020-08-30 NOTE — Progress Notes (Signed)
Brandon Simmons    [id number]    Oct 26, 1945  Primary Care Physician:Paterson, Quillian Quince, MD  Referring Physician: Leanna Battles, [address] [place],  [place] [address]   Virtual Visit via Telephone Note  I connected with Brandon Alt. on 08/30/20 at  9:15 AM EDT by telephone and verified that I am speaking with the correct person using two identifiers.  Location: Patient: Home Provider: [facility], [place], [place]   I discussed the limitations, risks, security and privacy concerns of performing an evaluation and management service by telephone and the availability of in person appointments. I also discussed with the patient that there may be a patient responsible charge related to this service. The patient expressed understanding and agreed to proceed.  Chief complaint: Follow-up for pneumonia, mild asthma  HPI: 75 year old with history of arrhythmias, asthma, prostate cancer, DVT Developed community-acquired pneumonia in mid April.  Symptoms started off with a upper respiratory viral infection and proceeded to cough, fevers of 102.  He was evaluated in the ED on 4/22 and given amoxicillin with no improvement.  He was reevaluated on 4/24 and doxycycline added.  He got a round of prednisone as well.  He was given breztri from his primary care doctor but had to stop after few days as he developed sore throat and hoarseness.  Continues to be symptomatic with persistent cough, nonproductive in nature, dyspnea.  He was scheduled to leave the clinic for a 3-week trip yesterday but had to cancel due to symptoms  History notable for mild asthma for which he sees Dr. Remus Blake at allergy center.  Treated with Asmanex inhaler, albuterol. 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  No feather pillows or comforters Smoking history: Minimal smoking history in his 42s Travel history: No significant travel history Relevant family history: No family history of lung disease  Interim history: Treated with levofloxacin and prednisone for 7 days last month for ongoing symptoms of pneumonia.  Unfortunately he developed atrial fibrillation transiently while on prednisone but is back to normal sinus rhythm States that overall his breathing is much improved and feels back to baseline  Continues on Asmanex and albuterol for asthma  Outpatient Encounter Medications as of 08/30/2020  Medication Sig   acetaminophen (TYLENOL) 500 MG tablet Take 500 mg by mouth at bedtime.    apixaban (ELIQUIS) 5 MG TABS tablet Take 5 mg by mouth 2 (two) times daily.    atorvastatin (LIPITOR) 40 MG tablet Take 20 mg by mouth every evening.   cholecalciferol (VITAMIN D) 1000 UNITS tablet Take 1,000 Units by mouth in the morning.   cyclobenzaprine (FLEXERIL) 10 MG tablet Take 10 mg by mouth daily as needed (shoulder pain.).   diclofenac sodium (VOLTAREN) 1 % GEL Apply 2 g topically 2 (two) times daily as needed (shoulder pain.).   diltiazem (CARDIZEM CD) 180 MG 24 hr capsule Take 1 capsule (180 mg total) by mouth daily in the afternoon.   diltiazem (CARDIZEM) 30 MG tablet TAKE 1 TABLET EVERY 4 HOURS AS NEEDED FOR AFIB RAPID HEART RATE OVER 100 (Patient taking differently: Take 30 mg by mouth every 4 (four) hours as needed (afib rapid heart rate over 100).)   EPINEPHrine  0.3 mg/0.3 mL IJ SOAJ injection Inject 0.3 mg into the muscle as needed for anaphylaxis.   flecainide (TAMBOCOR) 100 MG tablet Take 1 tablet (100 mg total) by mouth 2 (two) times daily.   Glucosamine-Chondroit-Vit C-Mn (GLUCOSAMINE 1500 COMPLEX PO) Take 1,500 mg by mouth in the morning.    HYDROcodone-homatropine (HYCODAN) 5-1.5 MG/5ML syrup Take 5 mLs by mouth every 6 (six) hours as needed for cough.   L-Lysine 500  MG TABS Take 500 mg by mouth every evening.   methocarbamol (ROBAXIN) 500 MG tablet Take 1 tablet (500 mg total) by mouth every 6 (six) hours as needed for muscle spasms.   Mometasone Furoate (ASMANEX HFA) 100 MCG/ACT AERO Inhale 1 puff into the lungs every evening.   montelukast (SINGULAIR) 10 MG tablet Take 10 mg by mouth at bedtime.   Multiple Vitamin (MULTIVITAMIN WITH MINERALS) TABS tablet Take 1 tablet by mouth in the morning.   Naphazoline-Pheniramine (OPCON-A) 0.027-0.315 % SOLN Place 1 drop into both eyes 2 (two) times daily as needed (eye allergies (red/irritated eyes)).   NON FORMULARY Allergy Shots every 3 weeks   omeprazole (PRILOSEC) 20 MG capsule Take 20 mg by mouth 2 (two) times daily before a meal.    Probiotic Product (ALIGN) 4 MG CAPS Take 4 mg by mouth every evening.   psyllium (METAMUCIL) 58.6 % powder Take 1 packet by mouth 2 (two) times daily.   VENTOLIN HFA 108 (90 Base) MCG/ACT inhaler Inhale 1-2 puffs into the lungs every 6 (six) hours as needed for wheezing or shortness of breath.   vitamin C (ASCORBIC ACID) 500 MG tablet Take 500 mg by mouth in the morning.   fluticasone (FLONASE) 50 MCG/ACT nasal spray Place 1 spray into both nostrils at bedtime.    levofloxacin (LEVAQUIN) 750 MG tablet Take 1 tablet (750 mg total) by mouth daily. (Patient not taking: Reported on 07/30/2020)   predniSONE (DELTASONE) 20 MG tablet Take 40mg  daily for 7 days (Patient not taking: No sig reported)   Facility-Administered Encounter Medications as of 08/30/2020  Medication   0.9 %  sodium chloride infusion    Physical Exam: Televisit  Data Reviewed: Imaging: Chest x-ray 07/09/2020- minimal basal atelectasis Chest x-ray 07/11/2020- patchy bibasilar opacities left greater than right Chest x-ray report from primary care 08/24/2020-slight improvement in bilateral infiltrates   PFTs:  Labs:  Assessment:  Community acquired pneumonia Has been treated with amoxicillin and doxycycline but  continues to be symptomatic with persistent cough  Got levofloxacin for 7 days and prednisone for 7 days at last visit.  He has had improvement and feels back to baseline. Chest x-ray report from primary care reviewed which shows some improvement which is expected as it will take several weeks to months for x-ray to go back to normalize.  Advised him to get repeat chest x-ray at primary care in 1 to 2 months.  If there continues to be abnormality then consider CT chest  Asthma Continues on steroid inhaler, albuterol as needed.  He would like to establish care here for management of asthma Return to clinic in 3 months to assess for PFTs  Preop for shoulder arthroplasty Please schedule for shoulder arthroplasty under general anesthesia in 09/02/2020 Is at low risk for perioperative complication as breathing is back to normal, asthma is under good control No pulmonary contraindications for surgery.  Plan/Recommendations: Repeat chest x-ray at primary care office in 1 to 2 months Follow-up in clinic in 3 months  I discussed the assessment and  treatment plan with the patient. The patient was provided an opportunity to ask questions and all were answered. The patient agreed with the plan and demonstrated an understanding of the instructions.   The patient was advised to call back or seek an in-person evaluation if the symptoms worsen or if the condition fails to improve as anticipated.  I provided 25 minutes of non-face-to-face time during this encounter.  Marshell Garfinkel MD [facility] 08/30/2020, 9:14 AM  CC: Leanna Battles, MD

## 2020-09-02 ENCOUNTER — Encounter (HOSPITAL_COMMUNITY): Payer: Self-pay | Admitting: Orthopedic Surgery

## 2020-09-02 ENCOUNTER — Ambulatory Visit (HOSPITAL_COMMUNITY): Payer: Medicare Other | Admitting: Physician Assistant

## 2020-09-02 ENCOUNTER — Encounter (HOSPITAL_COMMUNITY)
Admission: RE | Disposition: A | Discharge status: Home / Self Care | Payer: Self-pay | Source: Home / Self Care | Attending: Orthopedic Surgery

## 2020-09-02 ENCOUNTER — Ambulatory Visit (HOSPITAL_COMMUNITY)
Admission: RE | Admit: 2020-09-02 | Discharge: 2020-09-02 | Disposition: A | Discharge status: Home / Self Care | Payer: Medicare Other | Attending: Orthopedic Surgery | Admitting: Orthopedic Surgery

## 2020-09-02 DIAGNOSIS — Z87891 Personal history of nicotine dependence: Secondary | ICD-10-CM | POA: Insufficient documentation

## 2020-09-02 DIAGNOSIS — Z7951 Long term (current) use of inhaled steroids: Secondary | ICD-10-CM | POA: Insufficient documentation

## 2020-09-02 DIAGNOSIS — I480 Paroxysmal atrial fibrillation: Secondary | ICD-10-CM | POA: Diagnosis not present

## 2020-09-02 DIAGNOSIS — M19012 Primary osteoarthritis, left shoulder: Secondary | ICD-10-CM | POA: Diagnosis not present

## 2020-09-02 DIAGNOSIS — Z79899 Other long term (current) drug therapy: Secondary | ICD-10-CM | POA: Insufficient documentation

## 2020-09-02 DIAGNOSIS — Z7901 Long term (current) use of anticoagulants: Secondary | ICD-10-CM | POA: Diagnosis not present

## 2020-09-02 DIAGNOSIS — G8918 Other acute postprocedural pain: Secondary | ICD-10-CM | POA: Diagnosis not present

## 2020-09-02 DIAGNOSIS — K219 Gastro-esophageal reflux disease without esophagitis: Secondary | ICD-10-CM | POA: Diagnosis not present

## 2020-09-02 HISTORY — PX: REVERSE SHOULDER ARTHROPLASTY: SHX5054

## 2020-09-02 SURGERY — ARTHROPLASTY, SHOULDER, TOTAL, REVERSE
Anesthesia: General | Site: Shoulder | Laterality: Left

## 2020-09-02 MED ORDER — TRANEXAMIC ACID-NACL 1000-0.7 MG/100ML-% IV SOLN
1000.0000 mg | INTRAVENOUS | Status: AC
  Administered 2020-09-02: 08:00:00 1000 mg via INTRAVENOUS
  Filled 2020-09-02: qty 100

## 2020-09-02 MED ORDER — ROCURONIUM BROMIDE 10 MG/ML (PF) SYRINGE
PREFILLED_SYRINGE | INTRAVENOUS | Status: DC | PRN
  Administered 2020-09-02: 20 mg via INTRAVENOUS
  Administered 2020-09-02: 50 mg via INTRAVENOUS

## 2020-09-02 MED ORDER — PROPOFOL 10 MG/ML IV BOLUS
INTRAVENOUS | Status: DC | PRN
  Administered 2020-09-02: 150 mg via INTRAVENOUS

## 2020-09-02 MED ORDER — MIDAZOLAM HCL 2 MG/2ML IJ SOLN
INTRAMUSCULAR | Status: AC
  Filled 2020-09-02: qty 2

## 2020-09-02 MED ORDER — ONDANSETRON HCL 4 MG/2ML IJ SOLN
INTRAMUSCULAR | Status: DC | PRN
  Administered 2020-09-02: 4 mg via INTRAVENOUS

## 2020-09-02 MED ORDER — OXYCODONE-ACETAMINOPHEN 5-325 MG PO TABS
ORAL_TABLET | ORAL | Status: AC
  Filled 2020-09-02: qty 1

## 2020-09-02 MED ORDER — ALUM & MAG HYDROXIDE-SIMETH 200-200-20 MG/5ML PO SUSP: 30.0000 mL | ORAL | Status: DC | PRN

## 2020-09-02 MED ORDER — ORAL CARE MOUTH RINSE: 15.0000 mL | Freq: Once | OROMUCOSAL | Status: AC

## 2020-09-02 MED ORDER — CYCLOBENZAPRINE HCL 10 MG PO TABS: 10.0000 mg | ORAL_TABLET | Freq: Every day | ORAL | 1 refills | Status: DC | PRN

## 2020-09-02 MED ORDER — BUPIVACAINE LIPOSOME 1.3 % IJ SUSP
INTRAMUSCULAR | Status: DC | PRN
  Administered 2020-09-02: 10 mL via PERINEURAL

## 2020-09-02 MED ORDER — CHLORHEXIDINE GLUCONATE 0.12 % MT SOLN
15.0000 mL | Freq: Once | OROMUCOSAL | Status: AC
  Administered 2020-09-02: 15 mL via OROMUCOSAL

## 2020-09-02 MED ORDER — LACTATED RINGERS IV SOLN: INTRAVENOUS | Status: DC

## 2020-09-02 MED ORDER — PHENYLEPHRINE HCL (PRESSORS) 10 MG/ML IV SOLN
INTRAVENOUS | Status: AC
  Filled 2020-09-02: qty 2

## 2020-09-02 MED ORDER — PROPOFOL 10 MG/ML IV BOLUS
INTRAVENOUS | Status: AC
  Filled 2020-09-02: qty 20

## 2020-09-02 MED ORDER — VANCOMYCIN HCL 1000 MG IV SOLR
INTRAVENOUS | Status: AC
  Filled 2020-09-02: qty 1000

## 2020-09-02 MED ORDER — LIDOCAINE 2% (20 MG/ML) 5 ML SYRINGE
INTRAMUSCULAR | Status: AC
  Filled 2020-09-02: qty 5

## 2020-09-02 MED ORDER — CEFAZOLIN SODIUM-DEXTROSE 2-4 GM/100ML-% IV SOLN
2.0000 g | INTRAVENOUS | Status: AC
  Administered 2020-09-02: 08:00:00 2 g via INTRAVENOUS
  Filled 2020-09-02: qty 100

## 2020-09-02 MED ORDER — FENTANYL CITRATE (PF) 100 MCG/2ML IJ SOLN
INTRAMUSCULAR | Status: AC
  Filled 2020-09-02: qty 2

## 2020-09-02 MED ORDER — FENTANYL CITRATE (PF) 100 MCG/2ML IJ SOLN
INTRAMUSCULAR | Status: DC | PRN
  Administered 2020-09-02 (×2): 50 ug via INTRAVENOUS

## 2020-09-02 MED ORDER — MAGNESIUM CITRATE PO SOLN: 1.0000 | Freq: Once | ORAL | Status: DC | PRN

## 2020-09-02 MED ORDER — MENTHOL 3 MG MT LOZG: 1.0000 | LOZENGE | OROMUCOSAL | Status: DC | PRN

## 2020-09-02 MED ORDER — ROCURONIUM BROMIDE 10 MG/ML (PF) SYRINGE
PREFILLED_SYRINGE | INTRAVENOUS | Status: AC
  Filled 2020-09-02: qty 10

## 2020-09-02 MED ORDER — VANCOMYCIN HCL 1000 MG IV SOLR
INTRAVENOUS | Status: DC | PRN
  Administered 2020-09-02: 1000 mg

## 2020-09-02 MED ORDER — BUPIVACAINE HCL (PF) 0.5 % IJ SOLN
INTRAMUSCULAR | Status: DC | PRN
  Administered 2020-09-02: 10 mL via PERINEURAL
  Administered 2020-09-02: 15 mL via PERINEURAL

## 2020-09-02 MED ORDER — SUGAMMADEX SODIUM 200 MG/2ML IV SOLN
INTRAVENOUS | Status: DC | PRN
  Administered 2020-09-02: 200 mg via INTRAVENOUS

## 2020-09-02 MED ORDER — DEXAMETHASONE SODIUM PHOSPHATE 10 MG/ML IJ SOLN
INTRAMUSCULAR | Status: AC
  Filled 2020-09-02: qty 1

## 2020-09-02 MED ORDER — ACETAMINOPHEN 500 MG PO TABS
1000.0000 mg | ORAL_TABLET | Freq: Once | ORAL | Status: AC
  Administered 2020-09-02: 1000 mg via ORAL
  Filled 2020-09-02: qty 2

## 2020-09-02 MED ORDER — ONDANSETRON HCL 4 MG PO TABS
4.0000 mg | ORAL_TABLET | Freq: Four times a day (QID) | ORAL | Status: DC | PRN
  Filled 2020-09-02: qty 1

## 2020-09-02 MED ORDER — POLYETHYLENE GLYCOL 3350 17 G PO PACK: 17.0000 g | PACK | Freq: Every day | ORAL | Status: DC | PRN

## 2020-09-02 MED ORDER — PROMETHAZINE HCL 25 MG/ML IJ SOLN
6.2500 mg | Freq: Four times a day (QID) | INTRAMUSCULAR | Status: DC | PRN
  Administered 2020-09-02: 6.25 mg via INTRAVENOUS

## 2020-09-02 MED ORDER — OXYCODONE-ACETAMINOPHEN 5-325 MG PO TABS: 1.0000 | ORAL_TABLET | ORAL | Status: DC | PRN

## 2020-09-02 MED ORDER — LACTATED RINGERS IV BOLUS
500.0000 mL | Freq: Once | INTRAVENOUS | Status: AC
  Administered 2020-09-02: 500 mL via INTRAVENOUS

## 2020-09-02 MED ORDER — DOCUSATE SODIUM 100 MG PO CAPS: 100.0000 mg | ORAL_CAPSULE | Freq: Two times a day (BID) | ORAL | Status: DC

## 2020-09-02 MED ORDER — LACTATED RINGERS IV BOLUS: 250.0000 mL | Freq: Once | INTRAVENOUS | Status: DC

## 2020-09-02 MED ORDER — METOCLOPRAMIDE HCL 5 MG PO TABS
5.0000 mg | ORAL_TABLET | Freq: Three times a day (TID) | ORAL | Status: DC | PRN
  Filled 2020-09-02: qty 2

## 2020-09-02 MED ORDER — OXYCODONE-ACETAMINOPHEN 5-325 MG PO TABS: 1.0000 | ORAL_TABLET | ORAL | 0 refills | Status: DC | PRN

## 2020-09-02 MED ORDER — PROMETHAZINE HCL 25 MG/ML IJ SOLN
INTRAMUSCULAR | Status: AC
  Filled 2020-09-02: qty 1

## 2020-09-02 MED ORDER — PHENOL 1.4 % MT LIQD: 1.0000 | OROMUCOSAL | Status: DC | PRN

## 2020-09-02 MED ORDER — HYDROMORPHONE HCL 1 MG/ML IJ SOLN
INTRAMUSCULAR | Status: AC
  Administered 2020-09-02: 0.5 mg via INTRAVENOUS
  Filled 2020-09-02: qty 1

## 2020-09-02 MED ORDER — BISACODYL 5 MG PO TBEC: 5.0000 mg | DELAYED_RELEASE_TABLET | Freq: Every day | ORAL | Status: DC | PRN

## 2020-09-02 MED ORDER — LIDOCAINE 2% (20 MG/ML) 5 ML SYRINGE
INTRAMUSCULAR | Status: DC | PRN
  Administered 2020-09-02: 40 mg via INTRAVENOUS

## 2020-09-02 MED ORDER — METOCLOPRAMIDE HCL 5 MG/ML IJ SOLN: 5.0000 mg | Freq: Three times a day (TID) | INTRAMUSCULAR | Status: DC | PRN

## 2020-09-02 MED ORDER — DIPHENHYDRAMINE HCL 12.5 MG/5ML PO ELIX: 12.5000 mg | ORAL_SOLUTION | ORAL | Status: DC | PRN

## 2020-09-02 MED ORDER — TRAMADOL HCL 50 MG PO TABS: 50.0000 mg | ORAL_TABLET | Freq: Four times a day (QID) | ORAL | 0 refills | Status: DC | PRN

## 2020-09-02 MED ORDER — ONDANSETRON HCL 4 MG/2ML IJ SOLN
INTRAMUSCULAR | Status: AC
  Filled 2020-09-02: qty 2

## 2020-09-02 MED ORDER — HYDROMORPHONE HCL 1 MG/ML IJ SOLN
0.2500 mg | INTRAMUSCULAR | Status: DC | PRN
  Administered 2020-09-02: 0.5 mg via INTRAVENOUS

## 2020-09-02 MED ORDER — ONDANSETRON HCL 4 MG PO TABS: 4.0000 mg | ORAL_TABLET | Freq: Three times a day (TID) | ORAL | 0 refills | Status: DC | PRN

## 2020-09-02 MED ORDER — PANTOPRAZOLE SODIUM 40 MG PO TBEC: 40.0000 mg | DELAYED_RELEASE_TABLET | Freq: Every day | ORAL | Status: DC

## 2020-09-02 MED ORDER — DEXAMETHASONE SODIUM PHOSPHATE 10 MG/ML IJ SOLN
INTRAMUSCULAR | Status: DC | PRN
  Administered 2020-09-02: 8 mg via INTRAVENOUS

## 2020-09-02 MED ORDER — SODIUM CHLORIDE 0.9 % IR SOLN
Status: DC | PRN
  Administered 2020-09-02: 1000 mL

## 2020-09-02 MED ORDER — PHENYLEPHRINE HCL-NACL 10-0.9 MG/250ML-% IV SOLN
INTRAVENOUS | Status: DC | PRN
  Administered 2020-09-02: 25 ug/min via INTRAVENOUS

## 2020-09-02 MED ORDER — HYDROMORPHONE HCL 1 MG/ML IJ SOLN
INTRAMUSCULAR | Status: AC
  Filled 2020-09-02: qty 1

## 2020-09-02 MED ORDER — ONDANSETRON HCL 4 MG/2ML IJ SOLN: 4.0000 mg | Freq: Four times a day (QID) | INTRAMUSCULAR | Status: DC | PRN

## 2020-09-02 MED ORDER — MIDAZOLAM HCL 5 MG/5ML IJ SOLN
INTRAMUSCULAR | Status: DC | PRN
  Administered 2020-09-02: 2 mg via INTRAVENOUS

## 2020-09-02 SURGICAL SUPPLY — 72 items
ADH SKN CLS APL DERMABOND .7 (GAUZE/BANDAGES/DRESSINGS) ×1
AID PSTN UNV HD RSTRNT DISP (MISCELLANEOUS) ×1
BAG SPEC THK2 15X12 ZIP CLS (MISCELLANEOUS) ×1
BAG ZIPLOCK 12X15 (MISCELLANEOUS) ×2 IMPLANT
BLADE SAW SGTL 83.5X18.5 (BLADE) ×2 IMPLANT
BSPLAT GLND +2X24 MDLR (Joint) ×1 IMPLANT
COOLER ICEMAN CLASSIC (MISCELLANEOUS) ×1 IMPLANT
COVER BACK TABLE 60X90IN (DRAPES) ×2 IMPLANT
COVER SURGICAL LIGHT HANDLE (MISCELLANEOUS) ×2 IMPLANT
COVER WAND RF STERILE (DRAPES) IMPLANT
CUP SUT UNIV REVERS 39 NEU (Shoulder) ×1 IMPLANT
DERMABOND ADVANCED (GAUZE/BANDAGES/DRESSINGS) ×1
DERMABOND ADVANCED .7 DNX12 (GAUZE/BANDAGES/DRESSINGS) ×1 IMPLANT
DRAPE INCISE IOBAN 66X45 STRL (DRAPES) IMPLANT
DRAPE ORTHO SPLIT 77X108 STRL (DRAPES) ×4
DRAPE SHEET LG 3/4 BI-LAMINATE (DRAPES) ×2 IMPLANT
DRAPE SURG 17X11 SM STRL (DRAPES) ×2 IMPLANT
DRAPE SURG ORHT 6 SPLT 77X108 (DRAPES) ×2 IMPLANT
DRAPE U-SHAPE 47X51 STRL (DRAPES) ×2 IMPLANT
DRESSING AQUACEL AG SP 3.5X6 (GAUZE/BANDAGES/DRESSINGS) ×1 IMPLANT
DRSG AQUACEL AG ADV 3.5X 6 (GAUZE/BANDAGES/DRESSINGS) ×1 IMPLANT
DRSG AQUACEL AG ADV 3.5X10 (GAUZE/BANDAGES/DRESSINGS) IMPLANT
DRSG AQUACEL AG SP 3.5X6 (GAUZE/BANDAGES/DRESSINGS) ×2
DURAPREP 26ML APPLICATOR (WOUND CARE) ×2 IMPLANT
ELECT BLADE TIP CTD 4 INCH (ELECTRODE) ×2 IMPLANT
ELECT REM PT RETURN 15FT ADLT (MISCELLANEOUS) ×2 IMPLANT
FACESHIELD WRAPAROUND (MASK) ×8 IMPLANT
FACESHIELD WRAPAROUND OR TEAM (MASK) ×4 IMPLANT
GLENOID UNI REV MOD 24 +2 LAT (Joint) ×1 IMPLANT
GLENOSPHERE 39+4 LAT/24 UNI RV (Joint) ×1 IMPLANT
GLOVE SRG 8 PF TXTR STRL LF DI (GLOVE) ×1 IMPLANT
GLOVE SURG ENC MOIS LTX SZ7 (GLOVE) ×2 IMPLANT
GLOVE SURG ENC MOIS LTX SZ7.5 (GLOVE) ×2 IMPLANT
GLOVE SURG UNDER POLY LF SZ7 (GLOVE) ×2 IMPLANT
GLOVE SURG UNDER POLY LF SZ8 (GLOVE) ×2
GOWN STRL REUS W/TWL LRG LVL3 (GOWN DISPOSABLE) ×4 IMPLANT
INSERT HUMERAL MED 39/ +3 (Shoulder) IMPLANT
INSERT MEDIUM HUMERAL 39/ +3 (Shoulder) ×2 IMPLANT
KIT BASIN OR (CUSTOM PROCEDURE TRAY) ×2 IMPLANT
KIT TURNOVER KIT A (KITS) ×2 IMPLANT
MANIFOLD NEPTUNE II (INSTRUMENTS) ×2 IMPLANT
NDL TAPERED W/ NITINOL LOOP (MISCELLANEOUS) ×1 IMPLANT
NEEDLE TAPERED W/ NITINOL LOOP (MISCELLANEOUS) ×2 IMPLANT
NS IRRIG 1000ML POUR BTL (IV SOLUTION) ×2 IMPLANT
PACK SHOULDER (CUSTOM PROCEDURE TRAY) ×2 IMPLANT
PAD ARMBOARD 7.5X6 YLW CONV (MISCELLANEOUS) ×2 IMPLANT
PAD COLD SHLDR WRAP-ON (PAD) ×1 IMPLANT
PIN NITINOL TARGETER 2.8 (PIN) IMPLANT
PIN SET MODULAR GLENOID SYSTEM (PIN) ×1 IMPLANT
RESTRAINT HEAD UNIVERSAL NS (MISCELLANEOUS) ×2 IMPLANT
SCREW CENTRAL MOD 30MM (Screw) ×1 IMPLANT
SCREW PERI LOCK 5.5X16 (Screw) ×2 IMPLANT
SCREW PERI LOCK 5.5X32 (Screw) ×1 IMPLANT
SCREW PERIPHERAL 5.5X28 LOCK (Screw) ×1 IMPLANT
SLING ARM FOAM STRAP LRG (SOFTGOODS) ×1 IMPLANT
SLING ARM FOAM STRAP MED (SOFTGOODS) IMPLANT
SPACER SHLD UNI REV 39 +6 (Shoulder) ×1 IMPLANT
SPONGE LAP 18X18 RF (DISPOSABLE) ×1 IMPLANT
STEM HUMERAL UNI REVERSE SZ10 (Stem) ×1 IMPLANT
SUCTION FRAZIER HANDLE 12FR (TUBING) ×2
SUCTION TUBE FRAZIER 12FR DISP (TUBING) ×1 IMPLANT
SUT FIBERWIRE #2 38 T-5 BLUE (SUTURE)
SUT MNCRL AB 3-0 PS2 18 (SUTURE) ×2 IMPLANT
SUT MON AB 2-0 CT1 36 (SUTURE) ×2 IMPLANT
SUT VIC AB 1 CT1 36 (SUTURE) ×2 IMPLANT
SUTURE FIBERWR #2 38 T-5 BLUE (SUTURE) IMPLANT
SUTURE TAPE 1.3 40 TPR END (SUTURE) ×2 IMPLANT
SUTURETAPE 1.3 40 TPR END (SUTURE) ×4
TOWEL OR 17X26 10 PK STRL BLUE (TOWEL DISPOSABLE) ×2 IMPLANT
TOWEL OR NON WOVEN STRL DISP B (DISPOSABLE) ×2 IMPLANT
WATER STERILE IRR 1000ML POUR (IV SOLUTION) ×4 IMPLANT
YANKAUER SUCT BULB TIP 10FT TU (MISCELLANEOUS) ×2 IMPLANT

## 2020-09-02 NOTE — Anesthesia Procedure Notes (Signed)
Procedure Name: Intubation Date/Time: 09/02/2020 7:45 AM Performed by: Victoriano Lain, CRNA Pre-anesthesia Checklist: Patient identified, Emergency Drugs available, Suction available, Patient being monitored and Timeout performed Patient Re-evaluated:Patient Re-evaluated prior to induction Oxygen Delivery Method: Circle system utilized Preoxygenation: Pre-oxygenation with 100% oxygen Induction Type: IV induction Ventilation: Mask ventilation without difficulty Laryngoscope Size: Mac and 4 Grade View: Grade II Tube type: Oral Tube size: 7.5 mm Number of attempts: 1 Airway Equipment and Method: Stylet Placement Confirmation: ETT inserted through vocal cords under direct vision, positive ETCO2 and breath sounds checked- equal and bilateral Secured at: 22 cm Tube secured with: Tape Dental Injury: Teeth and Oropharynx as per pre-operative assessment

## 2020-09-02 NOTE — Evaluation (Signed)
Occupational Therapy Evaluation Patient Details Name: Brandon Simmons. MRN: [id number] DOB: 04/10/1945 Today's Date: 09/02/2020    History of Present Illness Patient is a 75 year old male s/p Left shoulder reverse arthroplasty. PMH includes a-fib, PNA, prostate CA   Clinical Impression   Patient is a 75 year old male s/p shoulder replacement without functional use of rleft dominant upper extremity secondary to effects of surgery, interscalene block and shoulder precautions. Therapist provided education and instruction to patient and spouse in regards to exercises, precautions, positioning, donning upper extremity clothing and bathing while maintaining shoulder precautions, ice and edema management and donning/doffing sling. Patient and spouse verbalized understanding and demonstrated as needed. Patient needed assistance to donn shirt, underwear, pants, socks and shoes and provided with instruction on compensatory strategies to perform ADLs. Patient to follow up with MD for further therapy needs.      Follow Up Recommendations  Other (comment) (per spouse patient going to rehab in their retirement community ([facility]    Equipment Recommendations  None recommended by OT       Precautions / Restrictions Precautions Precautions: Shoulder Type of Shoulder Precautions: If sitting in controlled environment, ok to come out of sling to give neck a break. Please sleep in it to protect until follow up in office.     OK to use operative arm for feeding, hygiene and ADLs. Ok to instruct Pendulums and lap slides as exercises. Ok to use operative arm within the following parameters for ADL purposes Ok for PROM, AAROM, AROM within pain tolerance and within the following ROM   ER 20   ABD 45   FE 60. Ok elbow, wrist, hand AROM Shoulder Interventions: Shoulder sling/immobilizer;Off for dressing/bathing/exercises Precaution Booklet Issued: Yes (comment) Required Braces or Orthoses:  Sling Restrictions Weight Bearing Restrictions: Yes LUE Weight Bearing: Non weight bearing      Mobility Bed Mobility                    Transfers Overall transfer level: Needs assistance Equipment used: None Transfers: Sit to/from Stand Sit to Stand: Min guard         General transfer comment: reports of dizziness in standing    Balance Overall balance assessment: Mild deficits observed, not formally tested                                         ADL either performed or assessed with clinical judgement   ADL Overall ADL's : Needs assistance/impaired Eating/Feeding: Independent   Grooming: Independent   Upper Body Bathing: Minimal assistance;Sitting   Lower Body Bathing: Minimal assistance   Upper Body Dressing : Minimal assistance;Sitting;Cueing for sequencing;Cueing for compensatory techniques Upper Body Dressing Details (indicate cue type and reason): assist to thread L UE due to nerve block Lower Body Dressing: Minimal assistance;Sitting/lateral leans;Sit to/from stand Lower Body Dressing Details (indicate cue type and reason): to pull up clothing over L hip Toilet Transfer: Min guard Toilet Transfer Details (indicate cue type and reason): patient reports dizziness with standing, seated BP reading 139/84 Toileting- Clothing Manipulation and Hygiene: Minimal assistance;Sit to/from stand Toileting - Clothing Manipulation Details (indicate cue type and reason): clothing management     Functional mobility during ADLs: Min guard General ADL Comments: patient and spouse educated in compensatory strategies for ADL tasks in order to maintain shoulder precautions      Pertinent Vitals/Pain Pain  Assessment: Faces Faces Pain Scale: Hurts a little bit Pain Location: L UE Pain Descriptors / Indicators: Heaviness;Numbness Pain Intervention(s): Monitored during session     Hand Dominance Left   Extremity/Trunk Assessment Upper Extremity  Assessment Upper Extremity Assessment: LUE deficits/detail LUE Deficits / Details: + nerve block   Lower Extremity Assessment Lower Extremity Assessment: Overall WFL for tasks assessed   Cervical / Trunk Assessment Cervical / Trunk Assessment: Normal   Communication Communication Communication: No difficulties   Cognition Arousal/Alertness: Awake/alert Behavior During Therapy: WFL for tasks assessed/performed Overall Cognitive Status: Within Functional Limits for tasks assessed                                        Exercises Exercises: Shoulder   Shoulder Instructions Shoulder Instructions Donning/doffing shirt without moving shoulder: Minimal assistance;Caregiver independent with task;Patient able to independently direct caregiver Method for sponge bathing under operated UE: Minimal assistance;Caregiver independent with task;Patient able to independently direct caregiver Donning/doffing sling/immobilizer: Moderate assistance;Caregiver independent with task;Patient able to independently direct caregiver Correct positioning of sling/immobilizer: Caregiver independent with task;Patient able to independently direct caregiver Pendulum exercises (written home exercise program): Caregiver independent with task;Patient able to independently direct caregiver ROM for elbow, wrist and digits of operated UE: Caregiver independent with task;Patient able to independently direct caregiver Sling wearing schedule (on at all times/off for ADL's): Caregiver independent with task;Patient able to independently direct caregiver Proper positioning of operated UE when showering: Caregiver independent with task;Patient able to independently direct caregiver Dressing change:  (N/A) Positioning of UE while sleeping: Caregiver independent with task;Patient able to independently direct caregiver    Home Living Family/patient expects to be discharged to:: Private residence Living Arrangements:  Spouse/significant other Available Help at Discharge: Family Type of Home: House Home Access: Level entry     Home Layout: One level     Bathroom Shower/Tub: Occupational psychologist: Handicapped height     Home Equipment: Grab bars - tub/shower   Additional Comments: plans to go to rehab in their retirement community post surgery      Prior Functioning/Environment Level of Independence: Independent                 OT Problem List: Pain;Impaired UE functional use;Decreased knowledge of precautions         OT Goals(Current goals can be found in the care plan section) Acute Rehab OT Goals Patient Stated Goal: do what I can for myself OT Goal Formulation: All assessment and education complete, DC therapy   AM-PAC OT "6 Clicks" Daily Activity     Outcome Measure Help from another person eating meals?: None Help from another person taking care of personal grooming?: None Help from another person toileting, which includes using toliet, bedpan, or urinal?: A Little Help from another person bathing (including washing, rinsing, drying)?: A Little Help from another person to put on and taking off regular upper body clothing?: A Little Help from another person to put on and taking off regular lower body clothing?: A Little 6 Click Score: 20   End of Session Equipment Utilized During Treatment: Other (comment) (sling) Nurse Communication: Other (comment) (OT complete)  Activity Tolerance: Patient tolerated treatment well Patient left: in chair;with call bell/phone within reach;with family/visitor present  OT Visit Diagnosis: Pain Pain - Right/Left: Left Pain - part of body: Shoulder  Time: 2072-1828 OT Time Calculation (min): 32 min Charges:  OT General Charges $OT Visit: 1 Visit OT Evaluation $OT Eval Low Complexity: 1 Low OT Treatments $Self Care/Home Management : 8-22 mins  Delbert Phenix OT OT pager: [phone]   Rosemary Holms 09/02/2020, 12:48 PM

## 2020-09-02 NOTE — Op Note (Signed)
09/02/2020  9:26 AM  PATIENT:   Brandon Simmons.  75 y.o. 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  ASSISTANTS: Jenetta Loges, PA-C  ANESTHESIA:   General endotracheal and interscalene block with Exparel  EBL: 100 cc  SPECIMEN: None  Drains: None   PATIENT DISPOSITION:  PACU - hemodynamically stable.    PLAN OF CARE: Discharge to home after PACU  Brief history:  [name] is a 75 year old gentleman who has had chronic and progressively increasing left shoulder pain related to severe glenohumeral joint osteoarthritis with associated posterior glenoid wear and posterior humeral head subluxation.  Due to his increasing functional imitations and failure to respond to prolonged attempts at conservative management he is brought to the operating this time for planned left shoulder reverse arthroplasty.  Preoperatively, I counseled the patient regarding treatment options and risks versus benefits thereof.  Possible surgical complications were all reviewed including potential for bleeding, infection, neurovascular injury, persistent pain, loss of motion, anesthetic complication, failure of the implant, and possible need for additional surgery. They understand and accept and agrees with our planned procedure.   Procedure in detail:  After undergoing routine preop evaluation the patient received prophylactic antibiotics and interscalene block with Exparel was established in the holding area by the anesthesia department.  Patient subsequently brought to the operating placed spine on the operative and underwent the smooth induction of a general endotracheal anesthesia.  Placed  into the beachchair position and appropriately padded and protected.  The left shoulder girdle region was sterilely prepped and draped in standard fashion.  Timeout was called.  A deltopectoral approach to the left shoulder was made through an approximately 10 cm incision.  Skin flaps were elevated dissection carried deeply and the deltopectoral interval was then developed from proximal to distal with the vein taken laterally.  The upper centimeter the pectoralis major tendon was then tenotomized for exposure and the conjoined tendon was mobilized and retracted medially.  Long head biceps tendon was then tenodesed at the upper border the pectoralis major and the proximal segment was then unroofed and excised.  The rotator cuff was then split along the rotator interval to the base of the coracoid and the subscapularis was then divided from the lesser tuberosity using electrocautery and the free margin was then tagged with a pair of suture tape sutures.  Capsular attachments were then divided from the anterior and inferior margins of the humeral neck and humeral head was then delivered through the wound with care taken to dissect free a very large osteophyte from the inferior margin of the humeral neck.  Once the humeral head was successfully delivered through the wound and extra medullary guide was then used to outline a proposed humeral head resection which we then performed approximately 20 degrees retroversion using an oscillating saw.  The large marginal osteophytes were then removed with a rondure and a metal cap was then placed over the cut proximal humeral surface.  The glenoid was then exposed with appropriate retractors and a circumferential labral resection was then performed.  Complete visualization of the glenoid was then achieved allowing visualization of the severe retroversion.  A guidepin was then directed into the center of the glenoid correcting for the marked retroversion  and the glenoid was then  reamed with a central hole by peripheral reamers to a stable subchondral bony bed and all peripheral debris and osteophytes were carefully removed.  Glenoid preparation was then completed with the central drill and tap at a 30 mm lag screw.  Our baseplate was then assembled and then inserted with vancomycin powder applied to the lag screw threads.  Excellent purchase and fixation was achieved.  All of the peripheral locking screws were then placed using standard technique with good purchase and fixation.  A 39/+4 glenosphere was then impacted onto the baseplate and the central locking screw was then placed.  At this point we returned our attention to the proximal humerus where the canal was opened and reaming to size 7 and broaching to a size 10.  A neutral metaphyseal reamer was then utilized to prepare the metaphysis.  A trial reduction was then performed showing good motion good stability good soft tissue balance.  At this point the trial was removed the final implant was assembled and vancomycin powder was then spread liberally into the humeral canal.  The implant was then seated with excellent fit and fixation.  This point a series of trial reduction was then performed and we ultimately felt that a total of +9 off the implant gave [place] the best mobility and soft tissue balance with good stability.  This point the trials were removed a +6 spacer was applied followed by the +3 poly-.  Final reduction was performed showing good motion good stability good soft tissue balance.  We then confirmed good elasticity of the subscapularis tendon was repaired back to the eyelets on the collar the implant using the previously placed suture tape sutures.  The arm easily achieved 45 degrees of external rotation upon repair.  Final irrigation was then completed.  Hemostasis was obtained.  The balance of the vancomycin powder was then spread liberally throughout the deep soft tissue layers.  The deltopectoral interval was then  reapproximated with a series of figure-of-eight number Vicryl sutures.  2-0 Monocryl used for the subcu layer and intracuticular 3-0 Monocryl for the skin followed by Dermabond and Aquacel dressing.  The left arm was then placed into a sling and the patient was awakened, extubated, and taken to the recovery room in stable condition.  Jenetta Loges, PA-C was utilized as an Environmental consultant throughout this case, essential for help with positioning the patient, positioning extremity, tissue manipulation, implantation of the prosthesis, suture management, wound closure, and intraoperative decision-making.  Marin Shutter MD   Contact # [phone]

## 2020-09-02 NOTE — Discharge Instructions (Addendum)
 Kevin M. Supple, M.D., F.A.A.O.S. Orthopaedic Surgery Specializing in Arthroscopic and Reconstructive Surgery of the Shoulder [phone] [address] - , [place] [address] - Fax [phone]   POST-OP TOTAL SHOULDER REPLACEMENT INSTRUCTIONS  1. Follow up in the office for your first post-op appointment 10-14 days from the date of your surgery. If you do not already have a scheduled appointment, our office will contact you to schedule.  2. The bandage over your incision is waterproof. You may begin showering with this dressing on. You may leave this dressing on until first follow up appointment within 2 weeks. We prefer you leave this dressing in place until follow up however after 5-7 days if you are having itching or skin irritation and would like to remove it you may do so. Go slow and tug at the borders gently to break the bond the dressing has with the skin. At this point if there is no drainage it is okay to go without a bandage or you may cover it with a light guaze and tape. You can also expect significant bruising around your shoulder that will drift down your arm and into your chest wall. This is very normal and should resolve over several days.   3. Wear your sling/immobilizer at all times except to perform the exercises below or to occasionally let your arm dangle by your side to stretch your elbow. You also need to sleep in your sling immobilizer until instructed otherwise. It is ok to remove your sling if you are sitting in a controlled environment and allow your arm to rest in a position of comfort by your side or on your lap with pillows to give your neck and skin a break from the sling. You may remove it to allow arm to dangle by side to shower. If you are up walking around and when you go to sleep at night you need to wear it.  4. Range of motion to your elbow, wrist, and hand are encouraged 3-5 times daily. Exercise to your hand and fingers helps to reduce  swelling you may experience.   5. Prescriptions for a pain medication and a muscle relaxant are provided for you. It is recommended that if you are experiencing pain that you pain medication alone is not controlling, add the muscle relaxant along with the pain medication which can give additional pain relief. The first 1-2 days is generally the most severe of your pain and then should gradually decrease. As your pain lessens it is recommended that you decrease your use of the pain medications to an "as needed basis'" only and to always comply with the recommended dosages of the pain medications.  6. Pain medications can produce constipation along with their use. If you experience this, the use of an over the counter stool softener or laxative daily is recommended.   7. For additional questions or concerns, please do not hesitate to call the office. If after hours there is an answering service to forward your concerns to the physician on call.  8.Pain control following an exparel block  To help control your post-operative pain you received a nerve block  performed with Exparel which is a long acting anesthetic (numbing agent) which can provide pain relief and sensations of numbness (and relief of pain) in the operative shoulder and arm for up to 3 days. Sometimes it provides mixed relief, meaning you may still have numbness in certain areas of the arm but can still be able to   move  parts of that arm, hand, and fingers. We recommend that your prescribed pain medications  be used as needed. We do not feel it is necessary to "pre medicate" and "stay ahead" of pain.  Taking narcotic pain medications when you are not having any pain can lead to unnecessary and potentially dangerous side effects.    9. Use the ice machine as much as possible in the first 5-7 days from surgery, then you can wean its use to as needed. The ice typically needs to be replaced every 6 hours, instead of ice you can actually freeze  water bottles to put in the cooler and then fill water around them to avoid having to purchase ice. You can have spare water bottles freezing to allow you to rotate them once they have melted. Try to have a thin shirt or light cloth or towel under the ice wrap to protect your skin.   FOR ADDITIONAL INFO ON ICE MACHINE AND INSTRUCTIONS GO TO THE WEBSITE AT  https://[email or web address].com/products/donjoy/[email or web address]-iceman-[email or web address]  10.  We recommend that you avoid any dental work or cleaning in the first 3 months following your joint replacement. This is to help minimize the possibility of infection from the bacteria in your mouth that enters your bloodstream during dental work. We also recommend that you take an antibiotic prior to your dental work for the first year after your shoulder replacement to further help reduce that risk. Please simply contact our office for antibiotics to be sent to your pharmacy prior to dental work.  11. Dental Antibiotics:  In most cases prophylactic antibiotics for Dental procdeures after total joint surgery are not necessary.  Exceptions are as follows:  1. History of prior total joint infection  2. Severely immunocompromised (Organ Transplant, cancer chemotherapy, Rheumatoid biologic meds such as Humera)  3. Poorly controlled diabetes (A1C &gt; 8.0, blood glucose over 200)  If you have one of these conditions, contact your surgeon for an antibiotic prescription, prior to your dental procedure.   POST-OP EXERCISES  Pendulum Exercises  Perform pendulum exercises while standing and bending at the waist. Support your uninvolved arm on a table or chair and allow your operated arm to hang freely. Make sure to do these exercises passively - not using you shoulder muscles. These exercises can be performed once your nerve block effects have worn off.  Repeat 20 times. Do 3 sessions per day.     

## 2020-09-02 NOTE — Anesthesia Procedure Notes (Signed)
Anesthesia Regional Block: Interscalene brachial plexus block   Pre-Anesthetic Checklist: , timeout performed,  Correct Patient, Correct Site, Correct Laterality,  Correct Procedure, Correct Position, site marked,  Risks and benefits discussed,  Pre-op evaluation,  At surgeon's request and post-op pain management  Laterality: Left  Prep: Maximum Sterile Barrier Precautions used, chloraprep       Needles:  Injection technique: Single-shot  Needle Type: Echogenic Stimulator Needle     Needle Length: 5cm  Needle Gauge: 22     Additional Needles:   Procedures:,,,, ultrasound used (permanent image in chart),,    Narrative:  Start time: 09/02/2020 6:55 AM End time: 09/02/2020 7:05 AM Injection made incrementally with aspirations every 5 mL. Anesthesiologist: Roderic Palau, MD  Additional Notes: 2% Lidocaine skin wheel.

## 2020-09-02 NOTE — H&P (Signed)
[facility]andon Simmons Section.    Chief Complaint: left shoulder osteoarthritis HPI: The patient is a 75 y.o. male with chronic and progressively increasing left shoulder pain secondary to severe left shoulder osteoarthritis and associated rotator cuff dysfunction.  Due to his increasing functional limitations and failure to respond to prolonged attempts at conservative management, he is brought to the operating at this time for planned left shoulder reverse arthroplasty  Past Medical History:  Diagnosis Date   Aortic atherosclerosis ([place])    Arthritis    Asthma    Diverticulitis    Diverticulosis    DVT (deep venous thrombosis) ([place]) 2019   right leg after back surgery   Dysrhythmia    A-fib every few weeks   Ectatic thoracic aorta ([place])    a. 4.3cm by CT 03/2018.   GERD (gastroesophageal reflux disease)    Hemorrhoids    History of echocardiogram    a. Echo 10/16 ([place]):  EF 55%, trace MR, mild TR, mild to mod AI, mild dilated Ao root (41 mm)   Hyperlipemia    Organic erectile dysfunction    PAF (paroxysmal atrial fibrillation) ([employer])    a. admx to [facility] in [place], [place] 10/16 with AF with RVR >> converted to NSR with IV Dilt;  b. Flecainide started >> ETT neg for pro-arrhythmia    Pneumonia 06/2020   Prostate cancer ([place]) 04/25/2006   Gleason 3+4=7   Prostatitis    S/P cardiac cath    a. LHC at [facility] 10/16:  LM ok, mLAD 30%, LCx ok, dRCA 20%   S/P radiation therapy 01/19/2014 through 03/05/2014                                                      Prostate bed 6600 cGy in 33 sessions                           Torn rotator cuff    right  40% tear    Past Surgical History:  Procedure Laterality Date   ABLATION OF DYSRHYTHMIC FOCUS  03/28/2018   APPENDECTOMY  1967   ATRIAL FIBRILLATION ABLATION N/A 03/28/2018   Procedure: ATRIAL FIBRILLATION ABLATION;  Surgeon: [name], MD;  Location: [place] [facility];  Service: Cardiovascular;  Laterality: N/A;    ATRIAL FIBRILLATION ABLATION N/A 11/11/2019   Procedure: ATRIAL FIBRILLATION ABLATION;  Surgeon: [name], MD;  Location: [place] [facility];  Service: Cardiovascular;  Laterality: N/A;   BACK SURGERY  2019   CARDIAC CATHETERIZATION  2016   [place]   CATARACT EXTRACTION W/ INTRAOCULAR LENS IMPLANT Left 11/2011   CATARACT EXTRACTION W/ INTRAOCULAR LENS IMPLANT Right 09/2015   COLONOSCOPY     LARYNX SURGERY  2000   vocal cord lesion- benign   LUMBAR LAMINECTOMY/DECOMPRESSION MICRODISCECTOMY Right 07/26/2017   Procedure: RIGHT LUMBAR 3- LUMBAR 4 FORAMINOTOMY WITH RESECTION OF SYNOVIAL CYST;  Surgeon: Erline Levine, MD;  Location: [place];  Service: Neurosurgery;  Laterality: Right;  RIGHT LUMBAR 3- LUMBAR 4 FORAMINOTOMY WITH RESECTION OF SYNOVIAL CYST   MENISCUS REPAIR Right    POLYPECTOMY     PROSTATE BIOPSY  1999   PROSTATECTOMY  04/25/2006    Family History  Problem Relation Age of Onset   Emphysema Father  Lung cancer Father    Hypertension Mother    Heart disease Mother        heart attack   Arthritis Mother    Diabetes Maternal Grandmother    Heart disease Maternal Grandfather        Died age 22   Liver cancer Paternal Grandmother    Heart disease Paternal Grandfather        Died age 48   Atrial fibrillation Son    Stomach cancer Neg Hx    Rectal cancer Neg Hx    Esophageal cancer Neg Hx    Colon cancer Neg Hx     Social History:  reports that he quit smoking about 50 years ago. His smoking use included cigarettes. He has a 10.00 pack-year smoking history. He has never used smokeless tobacco. He reports previous alcohol use. He reports that he does not use drugs.   Facility-Administered Medications Prior to Admission  Medication Dose Route Frequency Provider Last Rate Last Admin   0.9 %  sodium chloride infusion  500 mL Intravenous Once [place], MD       Medications Prior to Admission  Medication Sig Dispense Refill   acetaminophen (TYLENOL) 500 MG  tablet Take 500 mg by mouth at bedtime.      atorvastatin (LIPITOR) 40 MG tablet Take 20 mg by mouth every evening.     cholecalciferol (VITAMIN D) 1000 UNITS tablet Take 1,000 Units by mouth in the morning.     diclofenac sodium (VOLTAREN) 1 % GEL Apply 2 g topically 2 (two) times daily as needed (shoulder pain.).     diltiazem (CARDIZEM CD) 180 MG 24 hr capsule Take 1 capsule (180 mg total) by mouth daily in the afternoon. 30 capsule 3   flecainide (TAMBOCOR) 100 MG tablet Take 1 tablet (100 mg total) by mouth 2 (two) times daily. 180 tablet 3   Glucosamine-Chondroit-Vit C-Mn (GLUCOSAMINE 1500 COMPLEX PO) Take 1,500 mg by mouth in the morning.      L-Lysine 500 MG TABS Take 500 mg by mouth every evening.     Mometasone Furoate (ASMANEX HFA) 100 MCG/ACT AERO Inhale 1 puff into the lungs every evening.     montelukast (SINGULAIR) 10 MG tablet Take 10 mg by mouth at bedtime.     Multiple Vitamin (MULTIVITAMIN WITH MINERALS) TABS tablet Take 1 tablet by mouth in the morning.     omeprazole (PRILOSEC) 20 MG capsule Take 20 mg by mouth 2 (two) times daily before a meal.      Probiotic Product (ALIGN) 4 MG CAPS Take 4 mg by mouth every evening.     psyllium (METAMUCIL) 58.6 % powder Take 1 packet by mouth 2 (two) times daily.     vitamin C (ASCORBIC ACID) 500 MG tablet Take 500 mg by mouth in the morning.     apixaban (ELIQUIS) 5 MG TABS tablet Take 5 mg by mouth 2 (two) times daily.      cyclobenzaprine (FLEXERIL) 10 MG tablet Take 10 mg by mouth daily as needed (shoulder pain.).     diltiazem (CARDIZEM) 30 MG tablet TAKE 1 TABLET EVERY 4 HOURS AS NEEDED FOR AFIB RAPID HEART RATE OVER 100 (Patient taking differently: Take 30 mg by mouth every 4 (four) hours as needed (afib rapid heart rate over 100).) 90 tablet 1   EPINEPHrine 0.3 mg/0.3 mL IJ SOAJ injection Inject 0.3 mg into the muscle as needed for anaphylaxis.     fluticasone (FLONASE) 50 MCG/ACT nasal spray Place  1 spray into both nostrils at  bedtime.      HYDROcodone-homatropine (HYCODAN) 5-1.5 MG/5ML syrup Take 5 mLs by mouth every 6 (six) hours as needed for cough. 120 mL 0   levofloxacin (LEVAQUIN) 750 MG tablet Take 1 tablet (750 mg total) by mouth daily. (Patient not taking: No sig reported) 7 tablet 0   methocarbamol (ROBAXIN) 500 MG tablet Take 1 tablet (500 mg total) by mouth every 6 (six) hours as needed for muscle spasms. 60 tablet 1   Naphazoline-Pheniramine (OPCON-A) 0.027-0.315 % SOLN Place 1 drop into both eyes 2 (two) times daily as needed (eye allergies (red/irritated eyes)).     NON FORMULARY Allergy Shots every 3 weeks     predniSONE (DELTASONE) 20 MG tablet Take 40mg  daily for 7 days (Patient not taking: No sig reported) 14 tablet 0   VENTOLIN HFA 108 (90 Base) MCG/ACT inhaler Inhale 1-2 puffs into the lungs every 6 (six) hours as needed for wheezing or shortness of breath.       Physical Exam: Left shoulder demonstrates painful and severely restricted motion as noted at recent office visit.  Globally decreased strength.  Neurovascular intact distally in the left upper extremity.  Radiographs  Plain films confirm severe osteoarthritis with subchondral sclerosis, peripheral osteophyte formation, and underlying bony deformity.  Vitals  Temp:  [98 F (36.7 C)] 98 F (36.7 C) (06/16 0538) Pulse Rate:  [64] 64 (06/16 0538) Resp:  [17] 17 (06/16 0538) BP: (149)/(78) 149/78 (06/16 0538) SpO2:  [98 %] 98 % (06/16 0538) Weight:  [93.9 kg] 93.9 kg (06/16 0601)  Assessment/Plan  Impression: left shoulder osteoarthritis  Plan of Action: Procedure(s): REVERSE SHOULDER ARTHROPLASTY  Coby Antrobus M Dwan Fennel 09/02/2020, 6:20 AM Contact # [phone]

## 2020-09-02 NOTE — Transfer of Care (Addendum)
Immediate Anesthesia Transfer of Care Note  Patient: Brandon Simmons.  Procedure(s) Performed: REVERSE SHOULDER ARTHROPLASTY (Left: Shoulder)  Patient Location: PACU  Anesthesia Type:General  Level of Consciousness: awake, alert , oriented and patient cooperative  Airway & Oxygen Therapy: Patient Spontanous Breathing and Patient connected to face mask oxygen  Post-op Assessment: Report given to RN, Post -op Vital signs reviewed and stable and Patient moving all extremities  Post vital signs: Reviewed and stable  Last Vitals:  Vitals Value Taken Time  BP 142/116 09/02/20 0933  Temp    Pulse 60 09/02/20 0935  Resp 13 09/02/20 0935  SpO2 100 % 09/02/20 0935  Vitals shown include unvalidated device data.  Last Pain:  Vitals:   09/02/20 0601  TempSrc:   PainSc: 4       Patients Stated Pain Goal: 3 (01/65/80 0634)  Complications: No notable events documented.

## 2020-09-02 NOTE — Anesthesia Postprocedure Evaluation (Signed)
Anesthesia Post Note  Patient: Brandon Simmons.  Procedure(s) Performed: REVERSE SHOULDER ARTHROPLASTY (Left: Shoulder)     Patient location during evaluation: PACU Anesthesia Type: General and Regional Level of consciousness: awake and alert Pain management: pain level controlled Vital Signs Assessment: post-procedure vital signs reviewed and stable Respiratory status: spontaneous breathing, nonlabored ventilation and respiratory function stable Cardiovascular status: blood pressure returned to baseline and stable Postop Assessment: no apparent nausea or vomiting Anesthetic complications: no   No notable events documented.  Last Vitals:  Vitals:   09/02/20 1100 09/02/20 1121  BP: 137/75   Pulse: (!) 55 65  Resp: 13 14  Temp: (!) 35.9 C   SpO2: 97% 98%    Last Pain:  Vitals:   09/02/20 1121  TempSrc:   PainSc: 3                  Deztiny Sarra,W. EDMOND

## 2020-09-03 ENCOUNTER — Encounter: Payer: Self-pay | Admitting: Orthopedic Surgery

## 2020-09-03 ENCOUNTER — Non-Acute Institutional Stay (SKILLED_NURSING_FACILITY): Payer: Medicare Other | Admitting: Orthopedic Surgery

## 2020-09-03 DIAGNOSIS — Z8701 Personal history of pneumonia (recurrent): Secondary | ICD-10-CM | POA: Diagnosis not present

## 2020-09-03 DIAGNOSIS — I480 Paroxysmal atrial fibrillation: Secondary | ICD-10-CM

## 2020-09-03 DIAGNOSIS — R2689 Other abnormalities of gait and mobility: Secondary | ICD-10-CM | POA: Diagnosis not present

## 2020-09-03 DIAGNOSIS — K219 Gastro-esophageal reflux disease without esophagitis: Secondary | ICD-10-CM

## 2020-09-03 DIAGNOSIS — R2681 Unsteadiness on feet: Secondary | ICD-10-CM | POA: Diagnosis not present

## 2020-09-03 DIAGNOSIS — Z9889 Other specified postprocedural states: Secondary | ICD-10-CM | POA: Diagnosis not present

## 2020-09-03 DIAGNOSIS — J4520 Mild intermittent asthma, uncomplicated: Secondary | ICD-10-CM | POA: Diagnosis not present

## 2020-09-03 DIAGNOSIS — K5903 Drug induced constipation: Secondary | ICD-10-CM

## 2020-09-03 DIAGNOSIS — J301 Allergic rhinitis due to pollen: Secondary | ICD-10-CM

## 2020-09-03 DIAGNOSIS — Z96612 Presence of left artificial shoulder joint: Secondary | ICD-10-CM | POA: Diagnosis not present

## 2020-09-03 DIAGNOSIS — M25511 Pain in right shoulder: Secondary | ICD-10-CM | POA: Diagnosis not present

## 2020-09-03 NOTE — Progress Notes (Signed)
Provider:  Yvonna Alanis, NP Location:  [facility] Room Number: 151-A Rehab Transitional Place of Service:  SNF (31)  PCP: Leanna Battles, MD Patient Care Team: Leanna Battles, MD as PCP - General (Internal Medicine) [name], MD as PCP - Electrophysiology (Cardiology) Minus Breeding, MD as PCP - Cardiology (Cardiology)  Extended Emergency Contact Information Primary Emergency Contact: Woody Seller Address: [address]          [place], [place] [address] Johnnette Litter of [place] Phone: [phone] Mobile Phone: [phone] Relation: Spouse  Code Status: Full Code  Goals of Care: Advanced Directive information Advanced Directives 09/03/2020  Does Patient Have a Medical Advance Directive? Yes  Type of Paramedic of [place];Living will  Does patient want to make changes to medical advance directive? No - Patient declined  Copy of [place] in Chart? Yes - validated most recent copy scanned in chart (See row information)  Would patient like information on creating a medical advance directive? -      Chief Complaint  Patient presents with   Follow-up    Rehab admission follow-up     HPI: Patient is a 75 y.o. male seen today for admission to [facility] rehabilitation. Past medical history includes: ascending aorta dilation, atrial fibrillation, mild aortic regurgitation, malignant neoplasm of prostate and mild renal insufficiency.   06/16 he underwent reverse left shoulder arthroplasty at [facility] by Dr. [name] due to chronic left shoulder pain. He was given general anesthesia with interscalene block with Exparel. EBL 100 cc. He tolerated surgery well and transferred to PACU. In PACU he was hemodynamically stable and decided to discharge to [facility] rehab for additional therapy and pain control. He reports feeling left shoulder pain around 4 am. Described as a  burning, rated 6/10. He was given percocet 5/325 mg and pain reduced to 3/10. He may also have tramadol for mild pain (rated < 5) or flexeril for muscle spasms. Arm remains in sling, polar ice machine remains on left shoulder.   Atrial fibrillation: rate controlled with Cardizem and flecainide, past ablation 03/2018 and 11/11/2019, Eliquis 5 mg bid restarted after procedure.  GERD: stable with Prilosec 20 mg bid Post surgical constipation: he reports flatus, he has been eating prunes at the bedside and staying hydrated Allergic rhinitis: stable with Singulair CAP: diagnosed 04/24 in ED, initially given amoxicillin and doxycycline, but later started on levofloxacin 750 mg x 7 days with prednisone 40 mg x 7 days. Denies sob today Asthma: stable with asmanex inhaler, and prn albuterol  Recent blood pressures:  06/17- 13/67, 102/60  06/16- 141/80, 135/74  He plans to discharge home 06/20 or 06/21.   Nurse does not report any other concerns, vitals stable.   Past Medical History:  Diagnosis Date   Aortic atherosclerosis ([place])    Arthritis    Asthma    Diverticulitis    Diverticulosis    DVT (deep venous thrombosis) ([place]) 2019   right leg after back surgery   Dysrhythmia    A-fib every few weeks   Ectatic thoracic aorta ([place])    a. 4.3cm by CT 03/2018.   GERD (gastroesophageal reflux disease)    Hemorrhoids    History of echocardiogram    a. Echo 10/16 ([place]):  EF 55%, trace MR, mild TR, mild to mod AI, mild dilated Ao root (41 mm)   Hyperlipemia    Organic erectile dysfunction    PAF (paroxysmal atrial  fibrillation) [facility])    a. admx to [facility] in [place], [place] 10/16 with AF with RVR >> converted to NSR with IV Dilt;  b. Flecainide started >> ETT neg for pro-arrhythmia    Pneumonia 06/2020   Prostate cancer ([place]) 04/25/2006   Gleason 3+4=7   Prostatitis    S/P cardiac cath    a. LHC at [facility] 10/16:  LM ok, mLAD 30%, LCx ok, dRCA 20%   S/P radiation  therapy 01/19/2014 through 03/05/2014                                                      Prostate bed 6600 cGy in 33 sessions                           Torn rotator cuff    right  40% tear   Past Surgical History:  Procedure Laterality Date   ABLATION OF DYSRHYTHMIC FOCUS  03/28/2018   APPENDECTOMY  1967   ATRIAL FIBRILLATION ABLATION N/A 03/28/2018   Procedure: ATRIAL FIBRILLATION ABLATION;  Surgeon: [name], MD;  Location: [facility] CV LAB;  Service: Cardiovascular;  Laterality: N/A;   ATRIAL FIBRILLATION ABLATION N/A 11/11/2019   Procedure: ATRIAL FIBRILLATION ABLATION;  Surgeon: [name], MD;  Location: [place] CV LAB;  Service: Cardiovascular;  Laterality: N/A;   BACK SURGERY  2019   CARDIAC CATHETERIZATION  2016   [place]   CATARACT EXTRACTION W/ INTRAOCULAR LENS IMPLANT Left 11/2011   CATARACT EXTRACTION W/ INTRAOCULAR LENS IMPLANT Right 09/2015   COLONOSCOPY     LARYNX SURGERY  2000   vocal cord lesion- benign   LUMBAR LAMINECTOMY/DECOMPRESSION MICRODISCECTOMY Right 07/26/2017   Procedure: RIGHT LUMBAR 3- LUMBAR 4 FORAMINOTOMY WITH RESECTION OF SYNOVIAL CYST;  Surgeon: Erline Levine, MD;  Location: [facility];  Service: Neurosurgery;  Laterality: Right;  RIGHT LUMBAR 3- LUMBAR 4 FORAMINOTOMY WITH RESECTION OF SYNOVIAL CYST   MENISCUS REPAIR Right    POLYPECTOMY     PROSTATE BIOPSY  1999   PROSTATECTOMY  04/25/2006    reports that he quit smoking about 50 years ago. His smoking use included cigarettes. He has a 10.00 pack-year smoking history. He has never used smokeless tobacco. He reports previous alcohol use. He reports that he does not use drugs. Social History   Socioeconomic History   Marital status: Married    Spouse name: Romie Minus   Number of children: 2   Years of education: Not on file   Highest education level: Not on file  Occupational History   Occupation: Immunologist [employer]   Occupation: Optometrist  Tobacco Use   Smoking status: Former    Packs/day:  1.00    Years: 10.00    Pack years: 10.00    Types: Cigarettes    Quit date: 1972    Years since quitting: 50.4   Smokeless tobacco: Never   Tobacco comments:    1972  Vaping Use   Vaping Use: Never used  Substance and Sexual Activity   Alcohol use: Not Currently   Drug use: No   Sexual activity: Not on file  Other Topics Concern   Not on file  Social History Narrative   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  Consultant for schools.   Travels over seas  Social Determinants of Health   Financial Resource Strain: Not on file  Food Insecurity: Not on file  Transportation Needs: Not on file  Physical Activity: Not on file  Stress: Not on file  Social Connections: Not on file  Intimate Partner Violence: Not on file    Functional Status Survey:    Family History  Problem Relation Age of Onset   Emphysema Father    Lung cancer Father    Hypertension Mother    Heart disease Mother        heart attack   Arthritis Mother    Diabetes Maternal Grandmother    Heart disease Maternal Grandfather        Died age 66   Liver cancer Paternal Grandmother    Heart disease Paternal Grandfather        Died age 71   Atrial fibrillation Son    Stomach cancer Neg Hx    Rectal cancer Neg Hx    Esophageal cancer Neg Hx    Colon cancer Neg Hx     Health Maintenance  Topic Date Due   Hepatitis C Screening  Never done   Zoster Vaccines- Shingrix (1 of 2) Never done   TETANUS/TDAP  12/25/2019   INFLUENZA VACCINE  10/18/2020   COVID-19 Vaccine (5 - Booster for Moderna series) 10/24/2020   COLONOSCOPY (Pts 45-39yrs Insurance coverage will need to be confirmed)  04/27/2030   PNA vac Low Risk Adult  Completed   HPV VACCINES  Aged Out    No Known Allergies  Outpatient Encounter Medications as of 09/03/2020  Medication Sig   acetaminophen (TYLENOL) 500 MG tablet Take 500 mg by mouth at bedtime as needed.   apixaban (ELIQUIS) 5 MG TABS tablet Take 5 mg by mouth 2 (two) times daily.     atorvastatin (LIPITOR) 40 MG tablet Take 20 mg by mouth every evening.   cyclobenzaprine (FLEXERIL) 10 MG tablet Take 1 tablet (10 mg total) by mouth daily as needed (shoulder pain.).   diltiazem (CARDIZEM CD) 180 MG 24 hr capsule Take 1 capsule (180 mg total) by mouth daily in the afternoon.   diltiazem (CARDIZEM) 30 MG tablet TAKE 1 TABLET EVERY 4 HOURS AS NEEDED FOR AFIB RAPID HEART RATE OVER 100   EPINEPHrine 0.3 mg/0.3 mL IJ SOAJ injection Inject 0.3 mg into the muscle as needed for anaphylaxis.   flecainide (TAMBOCOR) 100 MG tablet Take 1 tablet (100 mg total) by mouth 2 (two) times daily.   Mometasone Furoate (ASMANEX HFA) 100 MCG/ACT AERO Inhale 1 puff into the lungs every evening.   montelukast (SINGULAIR) 10 MG tablet Take 10 mg by mouth at bedtime.   Naphazoline-Pheniramine (OPCON-A) 0.027-0.315 % SOLN Place 1 drop into both eyes 2 (two) times daily as needed (eye allergies (red/irritated eyes)).   NON FORMULARY Allergy Shots every 3 weeks   omeprazole (PRILOSEC) 20 MG capsule Take 20 mg by mouth 2 (two) times daily before a meal.    ondansetron (ZOFRAN) 4 MG tablet Take 1 tablet (4 mg total) by mouth every 8 (eight) hours as needed for nausea or vomiting.   oxyCODONE-acetaminophen (PERCOCET) 5-325 MG tablet Take 1 tablet by mouth every 4 (four) hours as needed (max 6 q for severe pain).   Probiotic Product (ALIGN) 4 MG CAPS Take 4 mg by mouth every evening.   psyllium (METAMUCIL) 58.6 % powder Take 1 packet by mouth 2 (two) times daily.   traMADol (ULTRAM) 50 MG tablet Take 1 tablet (50 mg  total) by mouth every 6 (six) hours as needed for moderate pain (prn mild to moderate pain).   VENTOLIN HFA 108 (90 Base) MCG/ACT inhaler Inhale 1-2 puffs into the lungs every 6 (six) hours as needed for wheezing or shortness of breath.   cholecalciferol (VITAMIN D) 1000 UNITS tablet Take 1,000 Units by mouth in the morning. (Patient not taking: Reported on 09/03/2020)   Glucosamine-Chondroit-Vit C-Mn  (GLUCOSAMINE 1500 COMPLEX PO) Take 1,500 mg by mouth in the morning.  (Patient not taking: Reported on 09/03/2020)   L-Lysine 500 MG TABS Take 500 mg by mouth every evening. (Patient not taking: Reported on 09/03/2020)   Multiple Vitamin (MULTIVITAMIN WITH MINERALS) TABS tablet Take 1 tablet by mouth in the morning. (Patient not taking: Reported on 09/03/2020)   vitamin C (ASCORBIC ACID) 500 MG tablet Take 500 mg by mouth in the morning. (Patient not taking: Reported on 09/03/2020)   No facility-administered encounter medications on file as of 09/03/2020.    Review of Systems  Constitutional:  Negative for activity change, appetite change and fever.  HENT: Negative.    Eyes: Negative.   Respiratory:  Negative for cough, shortness of breath and wheezing.   Cardiovascular:  Negative for chest pain and leg swelling.  Gastrointestinal:  Negative for abdominal distention, abdominal pain, constipation, diarrhea and nausea.  Genitourinary:  Negative for dysuria, frequency and hematuria.  Musculoskeletal:  Positive for arthralgias and myalgias.       Left shoulder pain  Skin:        Left shoulder incision  Neurological:  Positive for numbness. Negative for dizziness, weakness and headaches.  Psychiatric/Behavioral:  Negative for confusion, dysphoric mood and sleep disturbance. The patient is not nervous/anxious.    Vitals:   09/03/20 0948  BP: 102/60  Pulse: 62  Resp: 20  Temp: (!) 97.5 F (36.4 C)  SpO2: 96%  Weight: 212 lb 3.2 oz (96.3 kg)  Height: 5\' 10"  (1.778 m)   Body mass index is 30.45 kg/m. Physical Exam Vitals reviewed.  Constitutional:      General: He is not in acute distress. HENT:     Head: Normocephalic.  Eyes:     General:        Right eye: No discharge.        Left eye: No discharge.  Cardiovascular:     Rate and Rhythm: Normal rate. Rhythm irregular.     Pulses: Normal pulses.     Heart sounds: Normal heart sounds. No murmur heard. Pulmonary:     Effort:  Pulmonary effort is normal. No respiratory distress.     Breath sounds: Normal breath sounds. No wheezing.  Abdominal:     General: Bowel sounds are normal. There is no distension.     Palpations: Abdomen is soft.     Tenderness: There is no abdominal tenderness.  Musculoskeletal:     Cervical back: Normal range of motion.     Right lower leg: No edema.     Left lower leg: No edema.  Lymphadenopathy:     Cervical: No cervical adenopathy.  Skin:    Capillary Refill: Capillary refill takes less than 2 seconds. Left finger cap refill < 2sec. Left radial pulse 2+.     Comments: Left shoulder incision, aquacell, CDI. No drainage. Mild swelling and bruising noted. Surrounding skin intact.   Neurological:     General: No focal deficit present.     Mental Status: He is alert and oriented to person, place, and time.  Psychiatric:  Mood and Affect: Mood normal.        Behavior: Behavior normal.    Labs reviewed: Basic Metabolic Panel: Recent Labs    07/09/20 2243 07/09/20 2320 07/11/20 1608 08/24/20 1501  NA 135 136 130* 140  K 3.2* 3.0* 3.4* 3.7  CL 101  --  99 108  CO2 23  --  23 28  GLUCOSE 106*  --  141* 131*  BUN 18  --  12 19  CREATININE 1.04  --  0.91 1.16  CALCIUM 9.0  --  8.5* 9.5   Liver Function Tests: Recent Labs    07/09/20 2243  AST 28  ALT 23  ALKPHOS 48  BILITOT 0.8  PROT 6.5  ALBUMIN 3.9   No results for input(s): LIPASE, AMYLASE in the last 8760 hours. No results for input(s): AMMONIA in the last 8760 hours. CBC: Recent Labs    11/03/19 1014 07/09/20 2243 07/09/20 2243 07/09/20 2320 07/11/20 1608 08/24/20 1501  WBC 6.1 14.3*  --   --  12.8* 6.9  NEUTROABS 3.3 10.8*  --   --  10.2*  --   HGB 13.9 13.3   < > 12.9* 12.0* 13.9  HCT 40.2 38.7*   < > 38.0* 34.4* 41.8  MCV 92 91.9  --   --  91.2 95.0  PLT 163 141*  --   --  146* 188   < > = values in this interval not displayed.   Cardiac Enzymes: No results for input(s): CKTOTAL, CKMB,  CKMBINDEX, TROPONINI in the last 8760 hours. BNP: Invalid input(s): POCBNP No results found for: HGBA1C Lab Results  Component Value Date   TSH 2.574 02/24/2015   No results found for: VITAMINB12 No results found for: FOLATE No results found for: IRON, TIBC, FERRITIN  Imaging and Procedures obtained prior to SNF admission: No results found.  Assessment/Plan 1. S/P arthroscopy of left shoulder - anesthesia block wore off around 4am - surgical incision CDI, LUE NWB - cont percocet 5/325 mg po every 4 hours for pain rated 6-10 - cont tramadol 50 mg po q6 prn for pain rated 1-5 - cont flexeril 10 mg po daily prn for muscle spasms - start tylenol 500 mg po bid prn  - cont PT/OT - cont sling - f/u with Dr. Onnie Graham in 2 weeks  2. Paroxysmal atrial fibrillation (HCC) - rate controlled with diltiazem and flecainide - cont Eliquis for clot prevention  3. Drug-induced constipation - + flatus, active bowel sounds - he is eating a lot of fruits and vegetables, prunes at bedside - recommend staying hydrated with water - miralax 17 g- mix with 4 oz fluid po daily prn for constipation  4. Gastroesophageal reflux disease without esophagitis - stable with Prilosec 20 mg po bid  5. Seasonal allergic rhinitis due to pollen - stable with Singulair daily  6. Mild intermittent asthma without complication - cont asmanex and prn albuterol  7. History of community acquired pneumonia - denies sob or chest pain - recently completed amoxicillin, doxycycline, Levaquin, and prednisone - recommend incentive spirometer 10x qid    Family/ staff Communication: plan discussed with patient and nurse  Labs/tests ordered: none

## 2020-09-06 ENCOUNTER — Non-Acute Institutional Stay (SKILLED_NURSING_FACILITY): Payer: Medicare Other | Admitting: Internal Medicine

## 2020-09-06 ENCOUNTER — Encounter: Payer: Self-pay | Admitting: Internal Medicine

## 2020-09-06 DIAGNOSIS — J4520 Mild intermittent asthma, uncomplicated: Secondary | ICD-10-CM

## 2020-09-06 DIAGNOSIS — M25511 Pain in right shoulder: Secondary | ICD-10-CM | POA: Diagnosis not present

## 2020-09-06 DIAGNOSIS — R2681 Unsteadiness on feet: Secondary | ICD-10-CM | POA: Diagnosis not present

## 2020-09-06 DIAGNOSIS — I480 Paroxysmal atrial fibrillation: Secondary | ICD-10-CM | POA: Diagnosis not present

## 2020-09-06 DIAGNOSIS — Z8701 Personal history of pneumonia (recurrent): Secondary | ICD-10-CM

## 2020-09-06 DIAGNOSIS — K219 Gastro-esophageal reflux disease without esophagitis: Secondary | ICD-10-CM | POA: Diagnosis not present

## 2020-09-06 DIAGNOSIS — E7849 Other hyperlipidemia: Secondary | ICD-10-CM | POA: Diagnosis not present

## 2020-09-06 DIAGNOSIS — R2689 Other abnormalities of gait and mobility: Secondary | ICD-10-CM | POA: Diagnosis not present

## 2020-09-06 DIAGNOSIS — Z9889 Other specified postprocedural states: Secondary | ICD-10-CM | POA: Diagnosis not present

## 2020-09-06 DIAGNOSIS — Z96612 Presence of left artificial shoulder joint: Secondary | ICD-10-CM | POA: Diagnosis not present

## 2020-09-06 MED ORDER — OXYCODONE-ACETAMINOPHEN 5-325 MG PO TABS: 1.0000 | ORAL_TABLET | ORAL | 0 refills | Status: DC | PRN

## 2020-09-06 NOTE — Progress Notes (Signed)
Provider:  Veleta Miners MD Location:    [facility] Room Number: 073 Place of Service:  SNF (31)  PCP: Leanna Battles, MD Patient Care Team: Leanna Battles, MD as PCP - General (Internal Medicine) [name], MD as PCP - Electrophysiology (Cardiology) Minus Breeding, MD as PCP - Cardiology (Cardiology)  Extended Emergency Contact Information Primary Emergency Contact: Woody Seller Address: [address]          [place], [place] [address] Johnnette Litter of [place] Phone: [phone] Mobile Phone: [phone] Relation: Spouse  Code Status: Full Code Goals of Care: Advanced Directive information Advanced Directives 09/06/2020  Does Patient Have a Medical Advance Directive? Yes  Type of Paramedic of [place];Living will  Does patient want to make changes to medical advance directive? No - Patient declined  Copy of [place] in Chart? Yes - validated most recent copy scanned in chart (See row information)  Would patient like information on creating a medical advance directive? -      Chief Complaint  Patient presents with   New Admit To SNF    Admission to SNF    HPI: Patient is a 75 y.o. male seen today for admission to SNF for Therapy and Pain Control  Patient has h/o Mild Asthma H/o DVT in the setting of Surgery H/o PAF on Eliquis s/p Ablation in 8/21 Now on Cardizem and Flecainide Recent Community Acquired Pneumonia. Follow with Pulmonology  Had Elective Reverse left Shoulder Arthroplasty by Dr Onnie Graham for Severe Arthritis Post op Uneventful Now in SNF for therapy Doing well. Plan to go home by tomorrow Only issue is Pain control. Wants to know if he can schedule some pain meds and how to proceed at home. He is able to perform some ADLS. Wife in room who plans to help Other Shoulder is good   Past Medical History:  Diagnosis Date   Aortic atherosclerosis ([place])     Arthritis    Asthma    Diverticulitis    Diverticulosis    DVT (deep venous thrombosis) ([place]) 2019   right leg after back surgery   Dysrhythmia    A-fib every few weeks   Ectatic thoracic aorta ([place])    a. 4.3cm by CT 03/2018.   GERD (gastroesophageal reflux disease)    Hemorrhoids    History of echocardiogram    a. Echo 10/16 ([facility]):  EF 55%, trace MR, mild TR, mild to mod AI, mild dilated Ao root (41 mm)   Hyperlipemia    Organic erectile dysfunction    PAF (paroxysmal atrial fibrillation) ([place])    a. admx to [facility] in [place], [place] 10/16 with AF with RVR >> converted to NSR with IV Dilt;  b. Flecainide started >> ETT neg for pro-arrhythmia    Pneumonia 06/2020   Prostate cancer ([place]) 04/25/2006   Gleason 3+4=7   Prostatitis    S/P cardiac cath    a. LHC at [facility] 10/16:  LM ok, mLAD 30%, LCx ok, dRCA 20%   S/P radiation therapy 01/19/2014 through 03/05/2014                                                      Prostate bed 6600 cGy in 33 sessions  Torn rotator cuff    right  40% tear   Past Surgical History:  Procedure Laterality Date   ABLATION OF DYSRHYTHMIC FOCUS  03/28/2018   APPENDECTOMY  1967   ATRIAL FIBRILLATION ABLATION N/A 03/28/2018   Procedure: ATRIAL FIBRILLATION ABLATION;  Surgeon: [name], MD;  Location: [facility];  Service: Cardiovascular;  Laterality: N/A;   ATRIAL FIBRILLATION ABLATION N/A 11/11/2019   Procedure: ATRIAL FIBRILLATION ABLATION;  Surgeon: [name], MD;  Location: [facility];  Service: Cardiovascular;  Laterality: N/A;   BACK SURGERY  2019   CARDIAC CATHETERIZATION  2016   [place]   CATARACT EXTRACTION W/ INTRAOCULAR LENS IMPLANT Left 11/2011   CATARACT EXTRACTION W/ INTRAOCULAR LENS IMPLANT Right 09/2015   COLONOSCOPY     LARYNX SURGERY  2000   vocal cord lesion- benign   LUMBAR LAMINECTOMY/DECOMPRESSION MICRODISCECTOMY Right 07/26/2017   Procedure: RIGHT LUMBAR  3- LUMBAR 4 FORAMINOTOMY WITH RESECTION OF SYNOVIAL CYST;  Surgeon: Erline Levine, MD;  Location: [facility];  Service: Neurosurgery;  Laterality: Right;  RIGHT LUMBAR 3- LUMBAR 4 FORAMINOTOMY WITH RESECTION OF SYNOVIAL CYST   MENISCUS REPAIR Right    POLYPECTOMY     PROSTATE BIOPSY  1999   PROSTATECTOMY  04/25/2006   REVERSE SHOULDER ARTHROPLASTY Left 09/02/2020   Procedure: REVERSE SHOULDER ARTHROPLASTY;  Surgeon: [name], MD;  [facility]: [facility];  Service: Orthopedics;  Laterality: Left;  138min    reports that he quit smoking about 50 years ago. His smoking use included cigarettes. He has a 10.00 pack-year smoking history. He has never used smokeless tobacco. He reports previous alcohol use. He reports that he does not use drugs. Social History   Socioeconomic History   Marital status: Married    Spouse name: Romie Minus   Number of children: 2   Years of education: Not on file   Highest education level: Not on file  Occupational History   Occupation: Immunologist GDS   Occupation: Optometrist  Tobacco Use   Smoking status: Former    Packs/day: 1.00    Years: 10.00    Pack years: 10.00    Types: Cigarettes    Quit date: 1972    Years since quitting: 50.5   Smokeless tobacco: Never   Tobacco comments:    1972  Vaping Use   Vaping Use: Never used  Substance and Sexual Activity   Alcohol use: Not Currently   Drug use: No   Sexual activity: Not on file  Other Topics Concern   Not on file  Social History Narrative   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  Consultant for schools.   Travels over seas    Social Determinants of Health   Financial Resource Strain: Not on file  Food Insecurity: Not on file  Transportation Needs: Not on file  Physical Activity: Not on file  Stress: Not on file  Social Connections: Not on file  Intimate Partner Violence: Not on file    Functional Status Survey:    Family History  Problem Relation Age of Onset   Emphysema Father    Lung cancer Father     Hypertension Mother    Heart disease Mother        heart attack   Arthritis Mother    Diabetes Maternal Grandmother    Heart disease Maternal Grandfather        Died age 27   Liver cancer Paternal Grandmother    Heart disease Paternal Grandfather  Died age 70   Atrial fibrillation Son    Stomach cancer Neg Hx    Rectal cancer Neg Hx    Esophageal cancer Neg Hx    Colon cancer Neg Hx     Health Maintenance  Topic Date Due   Hepatitis C Screening  Never done   Zoster Vaccines- Shingrix (1 of 2) Never done   TETANUS/TDAP  12/25/2019   INFLUENZA VACCINE  10/18/2020   COVID-19 Vaccine (5 - Booster for Moderna series) 10/24/2020   COLONOSCOPY (Pts 45-34yrs Insurance coverage will need to be confirmed)  04/27/2030   PNA vac Low Risk Adult  Completed   HPV VACCINES  Aged Out    No Known Allergies  Allergies as of 09/06/2020   No Known Allergies      Medication List        Accurate as of September 06, 2020 10:18 AM. If you have any questions, ask your nurse or doctor.          STOP taking these medications    cholecalciferol 1000 units tablet Commonly known as: VITAMIN D Stopped by: Virgie Dad, MD   GLUCOSAMINE 1500 COMPLEX PO Stopped by: Virgie Dad, MD   L-Lysine 500 MG Tabs Stopped by: Virgie Dad, MD   multivitamin with minerals Tabs tablet Stopped by: Virgie Dad, MD   vitamin C 500 MG tablet Commonly known as: ASCORBIC ACID Stopped by: Virgie Dad, MD       TAKE these medications    acetaminophen 500 MG tablet Commonly known as: TYLENOL Take 500 mg by mouth 2 (two) times daily as needed.   Align 4 MG Caps Take 4 mg by mouth every evening.   apixaban 5 MG Tabs tablet Commonly known as: ELIQUIS Take 5 mg by mouth 2 (two) times daily.   Asmanex HFA 100 MCG/ACT Aero Generic drug: Mometasone Furoate Inhale 1 puff into the lungs every evening.   atorvastatin 40 MG tablet Commonly known as: LIPITOR Take 20 mg by mouth every  evening.   cyclobenzaprine 10 MG tablet Commonly known as: FLEXERIL Take 1 tablet (10 mg total) by mouth daily as needed (shoulder pain.).   diltiazem 180 MG 24 hr capsule Commonly known as: Cardizem CD Take 1 capsule (180 mg total) by mouth daily in the afternoon.   diltiazem 30 MG tablet Commonly known as: CARDIZEM TAKE 1 TABLET EVERY 4 HOURS AS NEEDED FOR AFIB RAPID HEART RATE OVER 100   EPINEPHrine 0.3 mg/0.3 mL Soaj injection Commonly known as: EPI-PEN Inject 0.3 mg into the muscle as needed for anaphylaxis.   flecainide 100 MG tablet Commonly known as: TAMBOCOR Take 1 tablet (100 mg total) by mouth 2 (two) times daily.   montelukast 10 MG tablet Commonly known as: SINGULAIR Take 10 mg by mouth at bedtime.   NON FORMULARY Allergy Shots every 3 weeks   omeprazole 20 MG capsule Commonly known as: PRILOSEC Take 20 mg by mouth 2 (two) times daily before a meal.   ondansetron 4 MG tablet Commonly known as: Zofran Take 1 tablet (4 mg total) by mouth every 8 (eight) hours as needed for nausea or vomiting.   Opcon-A 0.027-0.315 % Soln Generic drug: Naphazoline-Pheniramine Place 1 drop into both eyes 2 (two) times daily as needed (eye allergies (red/irritated eyes)).   oxyCODONE-acetaminophen 5-325 MG tablet Commonly known as: Percocet Take 1 tablet by mouth every 4 (four) hours as needed (max 6 q for severe pain).   polyethylene glycol  17 g packet Commonly known as: MIRALAX / GLYCOLAX Take 17 g by mouth daily as needed.   psyllium 58.6 % powder Commonly known as: METAMUCIL Take 1 packet by mouth 2 (two) times daily.   traMADol 50 MG tablet Commonly known as: ULTRAM Take 1 tablet (50 mg total) by mouth every 6 (six) hours as needed for moderate pain (prn mild to moderate pain).   Ventolin HFA 108 (90 Base) MCG/ACT inhaler Generic drug: albuterol Inhale 1-2 puffs into the lungs every 6 (six) hours as needed for wheezing or shortness of breath.        Review  of Systems  Constitutional:  Positive for activity change.  HENT: Negative.    Respiratory: Negative.    Cardiovascular:  Positive for leg swelling.  Gastrointestinal:  Positive for constipation.  Genitourinary: Negative.   Musculoskeletal:  Positive for arthralgias.  Skin: Negative.   Neurological: Negative.   Psychiatric/Behavioral: Negative.     Vitals:   09/06/20 1006  BP: 138/80  Pulse: (!) 53  Resp: 16  Temp: 98.1 F (36.7 C)  SpO2: 99%  Weight: 212 lb 3.2 oz (96.3 kg)  Height: 5\' 10"  (1.778 m)   Body mass index is 30.45 kg/m. Physical Exam Constitutional: Oriented to person, place, and time. Well-developed and well-nourished.  HENT:  Head: Normocephalic.  Mouth/Throat: Oropharynx is clear and moist.  Eyes: Pupils are equal, round, and reactive to light.  Neck: Neck supple.  Cardiovascular: Normal rate and normal heart sounds.  No murmur heard. Pulmonary/Chest: Effort normal and breath sounds normal. No respiratory distress. No wheezes. Few Crackles in the base Abdominal: Soft. Bowel sounds are normal. No distension. There is no tenderness. There is no rebound.  Musculoskeletal: Mild edema Bilateral Lymphadenopathy: none Neurological: Alert and oriented to person, place, and time.  Skin: Skin is warm and dry.  Psychiatric: Normal mood and affect. Behavior is normal. Thought content normal.   Labs reviewed: Basic Metabolic Panel: Recent Labs    07/09/20 2243 07/09/20 2320 07/11/20 1608 08/24/20 1501  NA 135 136 130* 140  K 3.2* 3.0* 3.4* 3.7  CL 101  --  99 108  CO2 23  --  23 28  GLUCOSE 106*  --  141* 131*  BUN 18  --  12 19  CREATININE 1.04  --  0.91 1.16  CALCIUM 9.0  --  8.5* 9.5   Liver Function Tests: Recent Labs    07/09/20 2243  AST 28  ALT 23  ALKPHOS 48  BILITOT 0.8  PROT 6.5  ALBUMIN 3.9   No results for input(s): LIPASE, AMYLASE in the last 8760 hours. No results for input(s): AMMONIA in the last 8760 hours. CBC: Recent Labs     11/03/19 1014 07/09/20 2243 07/09/20 2243 07/09/20 2320 07/11/20 1608 08/24/20 1501  WBC 6.1 14.3*  --   --  12.8* 6.9  NEUTROABS 3.3 10.8*  --   --  10.2*  --   HGB 13.9 13.3   < > 12.9* 12.0* 13.9  HCT 40.2 38.7*   < > 38.0* 34.4* 41.8  MCV 92 91.9  --   --  91.2 95.0  PLT 163 141*  --   --  146* 188   < > = values in this interval not displayed.   Cardiac Enzymes: No results for input(s): CKTOTAL, CKMB, CKMBINDEX, TROPONINI in the last 8760 hours. BNP: Invalid input(s): POCBNP No results found for: HGBA1C Lab Results  Component Value Date   TSH 2.574 02/24/2015  No results found for: VITAMINB12 No results found for: FOLATE No results found for: IRON, TIBC, FERRITIN  Imaging and Procedures obtained prior to SNF admission: No results found.  Assessment/Plan  S/P arthroscopy of left shoulder Doing well Plan to go home in next few days with help of his Wife Made a schedule for him to take alternate Percocet with tramadol. Also use Flexeril at night Order 2 week supply Continue Therapy Follow up with Ortho and his PCP  Paroxysmal atrial fibrillation (HCC) On Cardizem and Tambocor Also on Eliquis GERD On Prilosec  Mild intermittent asthma without complication Continue on Singulair History of community acquired pneumonia Will Follow with Pulmonary Other hyperlipidemia Continue Statin Follow up with PCP  Family/ staff Communication: With Wife in the room  Labs/tests ordered:  Total time spent in this patient care encounter was  _45  minutes; greater than 50% of the visit spent counseling patient and staff, reviewing records , Labs and coordinating care for problems addressed at this encounter.

## 2020-09-07 DIAGNOSIS — Z96612 Presence of left artificial shoulder joint: Secondary | ICD-10-CM | POA: Diagnosis not present

## 2020-09-07 DIAGNOSIS — M6388 Disorders of muscle in diseases classified elsewhere, other site: Secondary | ICD-10-CM | POA: Diagnosis not present

## 2020-09-07 DIAGNOSIS — M24812 Other specific joint derangements of left shoulder, not elsewhere classified: Secondary | ICD-10-CM | POA: Diagnosis not present

## 2020-09-07 DIAGNOSIS — M63812 Disorders of muscle in diseases classified elsewhere, left shoulder: Secondary | ICD-10-CM | POA: Diagnosis not present

## 2020-09-07 DIAGNOSIS — M25511 Pain in right shoulder: Secondary | ICD-10-CM | POA: Diagnosis not present

## 2020-09-07 DIAGNOSIS — R278 Other lack of coordination: Secondary | ICD-10-CM | POA: Diagnosis not present

## 2020-09-07 DIAGNOSIS — R2689 Other abnormalities of gait and mobility: Secondary | ICD-10-CM | POA: Diagnosis not present

## 2020-09-07 DIAGNOSIS — R2681 Unsteadiness on feet: Secondary | ICD-10-CM | POA: Diagnosis not present

## 2020-09-07 DIAGNOSIS — M25512 Pain in left shoulder: Secondary | ICD-10-CM | POA: Diagnosis not present

## 2020-09-07 DIAGNOSIS — R600 Localized edema: Secondary | ICD-10-CM | POA: Diagnosis not present

## 2020-09-08 DIAGNOSIS — R278 Other lack of coordination: Secondary | ICD-10-CM | POA: Diagnosis not present

## 2020-09-08 DIAGNOSIS — Z96612 Presence of left artificial shoulder joint: Secondary | ICD-10-CM | POA: Diagnosis not present

## 2020-09-08 DIAGNOSIS — M6388 Disorders of muscle in diseases classified elsewhere, other site: Secondary | ICD-10-CM | POA: Diagnosis not present

## 2020-09-08 DIAGNOSIS — M24812 Other specific joint derangements of left shoulder, not elsewhere classified: Secondary | ICD-10-CM | POA: Diagnosis not present

## 2020-09-08 DIAGNOSIS — M25512 Pain in left shoulder: Secondary | ICD-10-CM | POA: Diagnosis not present

## 2020-09-08 DIAGNOSIS — R600 Localized edema: Secondary | ICD-10-CM | POA: Diagnosis not present

## 2020-09-08 DIAGNOSIS — M63812 Disorders of muscle in diseases classified elsewhere, left shoulder: Secondary | ICD-10-CM | POA: Diagnosis not present

## 2020-09-10 DIAGNOSIS — J301 Allergic rhinitis due to pollen: Secondary | ICD-10-CM | POA: Diagnosis not present

## 2020-09-10 DIAGNOSIS — M6388 Disorders of muscle in diseases classified elsewhere, other site: Secondary | ICD-10-CM | POA: Diagnosis not present

## 2020-09-10 DIAGNOSIS — M25511 Pain in right shoulder: Secondary | ICD-10-CM | POA: Diagnosis not present

## 2020-09-10 DIAGNOSIS — R600 Localized edema: Secondary | ICD-10-CM | POA: Diagnosis not present

## 2020-09-10 DIAGNOSIS — Z96612 Presence of left artificial shoulder joint: Secondary | ICD-10-CM | POA: Diagnosis not present

## 2020-09-10 DIAGNOSIS — R2689 Other abnormalities of gait and mobility: Secondary | ICD-10-CM | POA: Diagnosis not present

## 2020-09-10 DIAGNOSIS — J3089 Other allergic rhinitis: Secondary | ICD-10-CM | POA: Diagnosis not present

## 2020-09-10 DIAGNOSIS — M25512 Pain in left shoulder: Secondary | ICD-10-CM | POA: Diagnosis not present

## 2020-09-10 DIAGNOSIS — M63812 Disorders of muscle in diseases classified elsewhere, left shoulder: Secondary | ICD-10-CM | POA: Diagnosis not present

## 2020-09-10 DIAGNOSIS — M24812 Other specific joint derangements of left shoulder, not elsewhere classified: Secondary | ICD-10-CM | POA: Diagnosis not present

## 2020-09-10 DIAGNOSIS — R278 Other lack of coordination: Secondary | ICD-10-CM | POA: Diagnosis not present

## 2020-09-10 DIAGNOSIS — R2681 Unsteadiness on feet: Secondary | ICD-10-CM | POA: Diagnosis not present

## 2020-09-13 ENCOUNTER — Ambulatory Visit: Payer: Medicare Other | Admitting: Internal Medicine

## 2020-09-13 DIAGNOSIS — Z471 Aftercare following joint replacement surgery: Secondary | ICD-10-CM | POA: Diagnosis not present

## 2020-09-13 DIAGNOSIS — Z96612 Presence of left artificial shoulder joint: Secondary | ICD-10-CM | POA: Diagnosis not present

## 2020-09-14 DIAGNOSIS — R600 Localized edema: Secondary | ICD-10-CM | POA: Diagnosis not present

## 2020-09-14 DIAGNOSIS — M63812 Disorders of muscle in diseases classified elsewhere, left shoulder: Secondary | ICD-10-CM | POA: Diagnosis not present

## 2020-09-14 DIAGNOSIS — M24812 Other specific joint derangements of left shoulder, not elsewhere classified: Secondary | ICD-10-CM | POA: Diagnosis not present

## 2020-09-14 DIAGNOSIS — R059 Cough, unspecified: Secondary | ICD-10-CM | POA: Diagnosis not present

## 2020-09-14 DIAGNOSIS — R278 Other lack of coordination: Secondary | ICD-10-CM | POA: Diagnosis not present

## 2020-09-14 DIAGNOSIS — M25512 Pain in left shoulder: Secondary | ICD-10-CM | POA: Diagnosis not present

## 2020-09-14 DIAGNOSIS — M6388 Disorders of muscle in diseases classified elsewhere, other site: Secondary | ICD-10-CM | POA: Diagnosis not present

## 2020-09-14 DIAGNOSIS — C61 Malignant neoplasm of prostate: Secondary | ICD-10-CM | POA: Diagnosis not present

## 2020-09-14 DIAGNOSIS — Z96612 Presence of left artificial shoulder joint: Secondary | ICD-10-CM | POA: Diagnosis not present

## 2020-09-16 DIAGNOSIS — M24812 Other specific joint derangements of left shoulder, not elsewhere classified: Secondary | ICD-10-CM | POA: Diagnosis not present

## 2020-09-16 DIAGNOSIS — M63812 Disorders of muscle in diseases classified elsewhere, left shoulder: Secondary | ICD-10-CM | POA: Diagnosis not present

## 2020-09-16 DIAGNOSIS — R278 Other lack of coordination: Secondary | ICD-10-CM | POA: Diagnosis not present

## 2020-09-16 DIAGNOSIS — Z96612 Presence of left artificial shoulder joint: Secondary | ICD-10-CM | POA: Diagnosis not present

## 2020-09-16 DIAGNOSIS — R600 Localized edema: Secondary | ICD-10-CM | POA: Diagnosis not present

## 2020-09-16 DIAGNOSIS — M6388 Disorders of muscle in diseases classified elsewhere, other site: Secondary | ICD-10-CM | POA: Diagnosis not present

## 2020-09-16 DIAGNOSIS — J3089 Other allergic rhinitis: Secondary | ICD-10-CM | POA: Diagnosis not present

## 2020-09-16 DIAGNOSIS — M25512 Pain in left shoulder: Secondary | ICD-10-CM | POA: Diagnosis not present

## 2020-09-16 DIAGNOSIS — J301 Allergic rhinitis due to pollen: Secondary | ICD-10-CM | POA: Diagnosis not present

## 2020-09-21 DIAGNOSIS — R278 Other lack of coordination: Secondary | ICD-10-CM | POA: Diagnosis not present

## 2020-09-21 DIAGNOSIS — Z96612 Presence of left artificial shoulder joint: Secondary | ICD-10-CM | POA: Diagnosis not present

## 2020-09-21 DIAGNOSIS — M63812 Disorders of muscle in diseases classified elsewhere, left shoulder: Secondary | ICD-10-CM | POA: Diagnosis not present

## 2020-09-21 DIAGNOSIS — M24812 Other specific joint derangements of left shoulder, not elsewhere classified: Secondary | ICD-10-CM | POA: Diagnosis not present

## 2020-09-21 DIAGNOSIS — R600 Localized edema: Secondary | ICD-10-CM | POA: Diagnosis not present

## 2020-09-21 DIAGNOSIS — M6388 Disorders of muscle in diseases classified elsewhere, other site: Secondary | ICD-10-CM | POA: Diagnosis not present

## 2020-09-21 DIAGNOSIS — M25512 Pain in left shoulder: Secondary | ICD-10-CM | POA: Diagnosis not present

## 2020-09-23 ENCOUNTER — Other Ambulatory Visit (HOSPITAL_COMMUNITY): Payer: Self-pay | Admitting: Urology

## 2020-09-23 DIAGNOSIS — C61 Malignant neoplasm of prostate: Secondary | ICD-10-CM

## 2020-09-23 DIAGNOSIS — R278 Other lack of coordination: Secondary | ICD-10-CM | POA: Diagnosis not present

## 2020-09-23 DIAGNOSIS — M25512 Pain in left shoulder: Secondary | ICD-10-CM | POA: Diagnosis not present

## 2020-09-23 DIAGNOSIS — R600 Localized edema: Secondary | ICD-10-CM | POA: Diagnosis not present

## 2020-09-23 DIAGNOSIS — M63812 Disorders of muscle in diseases classified elsewhere, left shoulder: Secondary | ICD-10-CM | POA: Diagnosis not present

## 2020-09-23 DIAGNOSIS — Z96612 Presence of left artificial shoulder joint: Secondary | ICD-10-CM | POA: Diagnosis not present

## 2020-09-23 DIAGNOSIS — M24812 Other specific joint derangements of left shoulder, not elsewhere classified: Secondary | ICD-10-CM | POA: Diagnosis not present

## 2020-09-23 DIAGNOSIS — M6388 Disorders of muscle in diseases classified elsewhere, other site: Secondary | ICD-10-CM | POA: Diagnosis not present

## 2020-09-24 DIAGNOSIS — D2371 Other benign neoplasm of skin of right lower limb, including hip: Secondary | ICD-10-CM | POA: Diagnosis not present

## 2020-09-24 DIAGNOSIS — L814 Other melanin hyperpigmentation: Secondary | ICD-10-CM | POA: Diagnosis not present

## 2020-09-24 DIAGNOSIS — D1801 Hemangioma of skin and subcutaneous tissue: Secondary | ICD-10-CM | POA: Diagnosis not present

## 2020-09-24 DIAGNOSIS — J301 Allergic rhinitis due to pollen: Secondary | ICD-10-CM | POA: Diagnosis not present

## 2020-09-24 DIAGNOSIS — L821 Other seborrheic keratosis: Secondary | ICD-10-CM | POA: Diagnosis not present

## 2020-09-24 DIAGNOSIS — J3089 Other allergic rhinitis: Secondary | ICD-10-CM | POA: Diagnosis not present

## 2020-09-24 DIAGNOSIS — D225 Melanocytic nevi of trunk: Secondary | ICD-10-CM | POA: Diagnosis not present

## 2020-09-24 DIAGNOSIS — L570 Actinic keratosis: Secondary | ICD-10-CM | POA: Diagnosis not present

## 2020-09-27 DIAGNOSIS — R600 Localized edema: Secondary | ICD-10-CM | POA: Diagnosis not present

## 2020-09-27 DIAGNOSIS — M6388 Disorders of muscle in diseases classified elsewhere, other site: Secondary | ICD-10-CM | POA: Diagnosis not present

## 2020-09-27 DIAGNOSIS — Z96612 Presence of left artificial shoulder joint: Secondary | ICD-10-CM | POA: Diagnosis not present

## 2020-09-27 DIAGNOSIS — R278 Other lack of coordination: Secondary | ICD-10-CM | POA: Diagnosis not present

## 2020-09-27 DIAGNOSIS — M63812 Disorders of muscle in diseases classified elsewhere, left shoulder: Secondary | ICD-10-CM | POA: Diagnosis not present

## 2020-09-27 DIAGNOSIS — M24812 Other specific joint derangements of left shoulder, not elsewhere classified: Secondary | ICD-10-CM | POA: Diagnosis not present

## 2020-09-27 DIAGNOSIS — M25512 Pain in left shoulder: Secondary | ICD-10-CM | POA: Diagnosis not present

## 2020-09-29 DIAGNOSIS — M63812 Disorders of muscle in diseases classified elsewhere, left shoulder: Secondary | ICD-10-CM | POA: Diagnosis not present

## 2020-09-29 DIAGNOSIS — M6388 Disorders of muscle in diseases classified elsewhere, other site: Secondary | ICD-10-CM | POA: Diagnosis not present

## 2020-09-29 DIAGNOSIS — M24812 Other specific joint derangements of left shoulder, not elsewhere classified: Secondary | ICD-10-CM | POA: Diagnosis not present

## 2020-09-29 DIAGNOSIS — R600 Localized edema: Secondary | ICD-10-CM | POA: Diagnosis not present

## 2020-09-29 DIAGNOSIS — M25512 Pain in left shoulder: Secondary | ICD-10-CM | POA: Diagnosis not present

## 2020-09-29 DIAGNOSIS — Z96612 Presence of left artificial shoulder joint: Secondary | ICD-10-CM | POA: Diagnosis not present

## 2020-09-29 DIAGNOSIS — R278 Other lack of coordination: Secondary | ICD-10-CM | POA: Diagnosis not present

## 2020-10-01 DIAGNOSIS — R278 Other lack of coordination: Secondary | ICD-10-CM | POA: Diagnosis not present

## 2020-10-01 DIAGNOSIS — M24812 Other specific joint derangements of left shoulder, not elsewhere classified: Secondary | ICD-10-CM | POA: Diagnosis not present

## 2020-10-01 DIAGNOSIS — M6388 Disorders of muscle in diseases classified elsewhere, other site: Secondary | ICD-10-CM | POA: Diagnosis not present

## 2020-10-01 DIAGNOSIS — J301 Allergic rhinitis due to pollen: Secondary | ICD-10-CM | POA: Diagnosis not present

## 2020-10-01 DIAGNOSIS — M63812 Disorders of muscle in diseases classified elsewhere, left shoulder: Secondary | ICD-10-CM | POA: Diagnosis not present

## 2020-10-01 DIAGNOSIS — R600 Localized edema: Secondary | ICD-10-CM | POA: Diagnosis not present

## 2020-10-01 DIAGNOSIS — Z96612 Presence of left artificial shoulder joint: Secondary | ICD-10-CM | POA: Diagnosis not present

## 2020-10-01 DIAGNOSIS — J3089 Other allergic rhinitis: Secondary | ICD-10-CM | POA: Diagnosis not present

## 2020-10-01 DIAGNOSIS — M25512 Pain in left shoulder: Secondary | ICD-10-CM | POA: Diagnosis not present

## 2020-10-04 DIAGNOSIS — M6388 Disorders of muscle in diseases classified elsewhere, other site: Secondary | ICD-10-CM | POA: Diagnosis not present

## 2020-10-04 DIAGNOSIS — M25512 Pain in left shoulder: Secondary | ICD-10-CM | POA: Diagnosis not present

## 2020-10-04 DIAGNOSIS — M63812 Disorders of muscle in diseases classified elsewhere, left shoulder: Secondary | ICD-10-CM | POA: Diagnosis not present

## 2020-10-04 DIAGNOSIS — Z96612 Presence of left artificial shoulder joint: Secondary | ICD-10-CM | POA: Diagnosis not present

## 2020-10-04 DIAGNOSIS — R278 Other lack of coordination: Secondary | ICD-10-CM | POA: Diagnosis not present

## 2020-10-04 DIAGNOSIS — R600 Localized edema: Secondary | ICD-10-CM | POA: Diagnosis not present

## 2020-10-04 DIAGNOSIS — M24812 Other specific joint derangements of left shoulder, not elsewhere classified: Secondary | ICD-10-CM | POA: Diagnosis not present

## 2020-10-06 DIAGNOSIS — M25512 Pain in left shoulder: Secondary | ICD-10-CM | POA: Diagnosis not present

## 2020-10-06 DIAGNOSIS — R600 Localized edema: Secondary | ICD-10-CM | POA: Diagnosis not present

## 2020-10-06 DIAGNOSIS — M24812 Other specific joint derangements of left shoulder, not elsewhere classified: Secondary | ICD-10-CM | POA: Diagnosis not present

## 2020-10-06 DIAGNOSIS — Z96612 Presence of left artificial shoulder joint: Secondary | ICD-10-CM | POA: Diagnosis not present

## 2020-10-06 DIAGNOSIS — M63812 Disorders of muscle in diseases classified elsewhere, left shoulder: Secondary | ICD-10-CM | POA: Diagnosis not present

## 2020-10-06 DIAGNOSIS — M6388 Disorders of muscle in diseases classified elsewhere, other site: Secondary | ICD-10-CM | POA: Diagnosis not present

## 2020-10-06 DIAGNOSIS — R278 Other lack of coordination: Secondary | ICD-10-CM | POA: Diagnosis not present

## 2020-10-08 DIAGNOSIS — R600 Localized edema: Secondary | ICD-10-CM | POA: Diagnosis not present

## 2020-10-08 DIAGNOSIS — R278 Other lack of coordination: Secondary | ICD-10-CM | POA: Diagnosis not present

## 2020-10-08 DIAGNOSIS — M24812 Other specific joint derangements of left shoulder, not elsewhere classified: Secondary | ICD-10-CM | POA: Diagnosis not present

## 2020-10-08 DIAGNOSIS — M6388 Disorders of muscle in diseases classified elsewhere, other site: Secondary | ICD-10-CM | POA: Diagnosis not present

## 2020-10-08 DIAGNOSIS — M63812 Disorders of muscle in diseases classified elsewhere, left shoulder: Secondary | ICD-10-CM | POA: Diagnosis not present

## 2020-10-08 DIAGNOSIS — M25512 Pain in left shoulder: Secondary | ICD-10-CM | POA: Diagnosis not present

## 2020-10-08 DIAGNOSIS — Z96612 Presence of left artificial shoulder joint: Secondary | ICD-10-CM | POA: Diagnosis not present

## 2020-10-11 DIAGNOSIS — M25512 Pain in left shoulder: Secondary | ICD-10-CM | POA: Diagnosis not present

## 2020-10-11 DIAGNOSIS — Z96612 Presence of left artificial shoulder joint: Secondary | ICD-10-CM | POA: Diagnosis not present

## 2020-10-11 DIAGNOSIS — R278 Other lack of coordination: Secondary | ICD-10-CM | POA: Diagnosis not present

## 2020-10-11 DIAGNOSIS — M6388 Disorders of muscle in diseases classified elsewhere, other site: Secondary | ICD-10-CM | POA: Diagnosis not present

## 2020-10-11 DIAGNOSIS — M24812 Other specific joint derangements of left shoulder, not elsewhere classified: Secondary | ICD-10-CM | POA: Diagnosis not present

## 2020-10-11 DIAGNOSIS — R600 Localized edema: Secondary | ICD-10-CM | POA: Diagnosis not present

## 2020-10-11 DIAGNOSIS — M63812 Disorders of muscle in diseases classified elsewhere, left shoulder: Secondary | ICD-10-CM | POA: Diagnosis not present

## 2020-10-13 ENCOUNTER — Other Ambulatory Visit: Payer: Self-pay

## 2020-10-13 ENCOUNTER — Ambulatory Visit (HOSPITAL_COMMUNITY)
Admission: RE | Admit: 2020-10-13 | Discharge: 2020-10-13 | Disposition: A | Discharge status: Home / Self Care | Payer: Medicare Other | Source: Ambulatory Visit | Attending: Urology | Admitting: Urology

## 2020-10-13 DIAGNOSIS — C61 Malignant neoplasm of prostate: Secondary | ICD-10-CM

## 2020-10-13 MED ORDER — PIFLIFOLASTAT F 18 (PYLARIFY) INJECTION
9.0000 | Freq: Once | INTRAVENOUS | Status: AC
  Administered 2020-10-13: 9 via INTRAVENOUS

## 2020-10-14 DIAGNOSIS — M25512 Pain in left shoulder: Secondary | ICD-10-CM | POA: Diagnosis not present

## 2020-10-14 DIAGNOSIS — R600 Localized edema: Secondary | ICD-10-CM | POA: Diagnosis not present

## 2020-10-14 DIAGNOSIS — R278 Other lack of coordination: Secondary | ICD-10-CM | POA: Diagnosis not present

## 2020-10-14 DIAGNOSIS — M6388 Disorders of muscle in diseases classified elsewhere, other site: Secondary | ICD-10-CM | POA: Diagnosis not present

## 2020-10-14 DIAGNOSIS — Z96612 Presence of left artificial shoulder joint: Secondary | ICD-10-CM | POA: Diagnosis not present

## 2020-10-14 DIAGNOSIS — M24812 Other specific joint derangements of left shoulder, not elsewhere classified: Secondary | ICD-10-CM | POA: Diagnosis not present

## 2020-10-14 DIAGNOSIS — M63812 Disorders of muscle in diseases classified elsewhere, left shoulder: Secondary | ICD-10-CM | POA: Diagnosis not present

## 2020-10-18 DIAGNOSIS — M6388 Disorders of muscle in diseases classified elsewhere, other site: Secondary | ICD-10-CM | POA: Diagnosis not present

## 2020-10-18 DIAGNOSIS — R600 Localized edema: Secondary | ICD-10-CM | POA: Diagnosis not present

## 2020-10-18 DIAGNOSIS — M63812 Disorders of muscle in diseases classified elsewhere, left shoulder: Secondary | ICD-10-CM | POA: Diagnosis not present

## 2020-10-18 DIAGNOSIS — R278 Other lack of coordination: Secondary | ICD-10-CM | POA: Diagnosis not present

## 2020-10-18 DIAGNOSIS — M24812 Other specific joint derangements of left shoulder, not elsewhere classified: Secondary | ICD-10-CM | POA: Diagnosis not present

## 2020-10-18 DIAGNOSIS — Z96612 Presence of left artificial shoulder joint: Secondary | ICD-10-CM | POA: Diagnosis not present

## 2020-10-18 DIAGNOSIS — M25512 Pain in left shoulder: Secondary | ICD-10-CM | POA: Diagnosis not present

## 2020-10-19 DIAGNOSIS — J301 Allergic rhinitis due to pollen: Secondary | ICD-10-CM | POA: Diagnosis not present

## 2020-10-19 DIAGNOSIS — J3089 Other allergic rhinitis: Secondary | ICD-10-CM | POA: Diagnosis not present

## 2020-10-20 DIAGNOSIS — M63812 Disorders of muscle in diseases classified elsewhere, left shoulder: Secondary | ICD-10-CM | POA: Diagnosis not present

## 2020-10-20 DIAGNOSIS — R600 Localized edema: Secondary | ICD-10-CM | POA: Diagnosis not present

## 2020-10-20 DIAGNOSIS — R278 Other lack of coordination: Secondary | ICD-10-CM | POA: Diagnosis not present

## 2020-10-20 DIAGNOSIS — Z96612 Presence of left artificial shoulder joint: Secondary | ICD-10-CM | POA: Diagnosis not present

## 2020-10-20 DIAGNOSIS — M24812 Other specific joint derangements of left shoulder, not elsewhere classified: Secondary | ICD-10-CM | POA: Diagnosis not present

## 2020-10-20 DIAGNOSIS — M6388 Disorders of muscle in diseases classified elsewhere, other site: Secondary | ICD-10-CM | POA: Diagnosis not present

## 2020-10-20 DIAGNOSIS — M25512 Pain in left shoulder: Secondary | ICD-10-CM | POA: Diagnosis not present

## 2020-10-22 DIAGNOSIS — M6388 Disorders of muscle in diseases classified elsewhere, other site: Secondary | ICD-10-CM | POA: Diagnosis not present

## 2020-10-22 DIAGNOSIS — R600 Localized edema: Secondary | ICD-10-CM | POA: Diagnosis not present

## 2020-10-22 DIAGNOSIS — M25512 Pain in left shoulder: Secondary | ICD-10-CM | POA: Diagnosis not present

## 2020-10-22 DIAGNOSIS — Z96612 Presence of left artificial shoulder joint: Secondary | ICD-10-CM | POA: Diagnosis not present

## 2020-10-22 DIAGNOSIS — R278 Other lack of coordination: Secondary | ICD-10-CM | POA: Diagnosis not present

## 2020-10-22 DIAGNOSIS — M24812 Other specific joint derangements of left shoulder, not elsewhere classified: Secondary | ICD-10-CM | POA: Diagnosis not present

## 2020-10-22 DIAGNOSIS — M63812 Disorders of muscle in diseases classified elsewhere, left shoulder: Secondary | ICD-10-CM | POA: Diagnosis not present

## 2020-11-01 DIAGNOSIS — Z96612 Presence of left artificial shoulder joint: Secondary | ICD-10-CM | POA: Diagnosis not present

## 2020-11-01 DIAGNOSIS — M6388 Disorders of muscle in diseases classified elsewhere, other site: Secondary | ICD-10-CM | POA: Diagnosis not present

## 2020-11-01 DIAGNOSIS — R278 Other lack of coordination: Secondary | ICD-10-CM | POA: Diagnosis not present

## 2020-11-01 DIAGNOSIS — R600 Localized edema: Secondary | ICD-10-CM | POA: Diagnosis not present

## 2020-11-01 DIAGNOSIS — M24812 Other specific joint derangements of left shoulder, not elsewhere classified: Secondary | ICD-10-CM | POA: Diagnosis not present

## 2020-11-01 DIAGNOSIS — M25512 Pain in left shoulder: Secondary | ICD-10-CM | POA: Diagnosis not present

## 2020-11-01 DIAGNOSIS — M63812 Disorders of muscle in diseases classified elsewhere, left shoulder: Secondary | ICD-10-CM | POA: Diagnosis not present

## 2020-11-02 DIAGNOSIS — J3089 Other allergic rhinitis: Secondary | ICD-10-CM | POA: Diagnosis not present

## 2020-11-02 DIAGNOSIS — J301 Allergic rhinitis due to pollen: Secondary | ICD-10-CM | POA: Diagnosis not present

## 2020-11-03 DIAGNOSIS — R600 Localized edema: Secondary | ICD-10-CM | POA: Diagnosis not present

## 2020-11-03 DIAGNOSIS — M25512 Pain in left shoulder: Secondary | ICD-10-CM | POA: Diagnosis not present

## 2020-11-03 DIAGNOSIS — R278 Other lack of coordination: Secondary | ICD-10-CM | POA: Diagnosis not present

## 2020-11-03 DIAGNOSIS — Z96612 Presence of left artificial shoulder joint: Secondary | ICD-10-CM | POA: Diagnosis not present

## 2020-11-03 DIAGNOSIS — M24812 Other specific joint derangements of left shoulder, not elsewhere classified: Secondary | ICD-10-CM | POA: Diagnosis not present

## 2020-11-03 DIAGNOSIS — M63812 Disorders of muscle in diseases classified elsewhere, left shoulder: Secondary | ICD-10-CM | POA: Diagnosis not present

## 2020-11-03 DIAGNOSIS — M6388 Disorders of muscle in diseases classified elsewhere, other site: Secondary | ICD-10-CM | POA: Diagnosis not present

## 2020-11-05 DIAGNOSIS — M6388 Disorders of muscle in diseases classified elsewhere, other site: Secondary | ICD-10-CM | POA: Diagnosis not present

## 2020-11-05 DIAGNOSIS — R278 Other lack of coordination: Secondary | ICD-10-CM | POA: Diagnosis not present

## 2020-11-05 DIAGNOSIS — M25512 Pain in left shoulder: Secondary | ICD-10-CM | POA: Diagnosis not present

## 2020-11-05 DIAGNOSIS — R600 Localized edema: Secondary | ICD-10-CM | POA: Diagnosis not present

## 2020-11-05 DIAGNOSIS — M63812 Disorders of muscle in diseases classified elsewhere, left shoulder: Secondary | ICD-10-CM | POA: Diagnosis not present

## 2020-11-05 DIAGNOSIS — Z96612 Presence of left artificial shoulder joint: Secondary | ICD-10-CM | POA: Diagnosis not present

## 2020-11-05 DIAGNOSIS — M24812 Other specific joint derangements of left shoulder, not elsewhere classified: Secondary | ICD-10-CM | POA: Diagnosis not present

## 2020-11-08 DIAGNOSIS — M6388 Disorders of muscle in diseases classified elsewhere, other site: Secondary | ICD-10-CM | POA: Diagnosis not present

## 2020-11-08 DIAGNOSIS — M63812 Disorders of muscle in diseases classified elsewhere, left shoulder: Secondary | ICD-10-CM | POA: Diagnosis not present

## 2020-11-08 DIAGNOSIS — Z96612 Presence of left artificial shoulder joint: Secondary | ICD-10-CM | POA: Diagnosis not present

## 2020-11-08 DIAGNOSIS — M25512 Pain in left shoulder: Secondary | ICD-10-CM | POA: Diagnosis not present

## 2020-11-08 DIAGNOSIS — R600 Localized edema: Secondary | ICD-10-CM | POA: Diagnosis not present

## 2020-11-08 DIAGNOSIS — R278 Other lack of coordination: Secondary | ICD-10-CM | POA: Diagnosis not present

## 2020-11-08 DIAGNOSIS — M24812 Other specific joint derangements of left shoulder, not elsewhere classified: Secondary | ICD-10-CM | POA: Diagnosis not present

## 2020-11-10 DIAGNOSIS — R600 Localized edema: Secondary | ICD-10-CM | POA: Diagnosis not present

## 2020-11-10 DIAGNOSIS — M24812 Other specific joint derangements of left shoulder, not elsewhere classified: Secondary | ICD-10-CM | POA: Diagnosis not present

## 2020-11-10 DIAGNOSIS — Z96612 Presence of left artificial shoulder joint: Secondary | ICD-10-CM | POA: Diagnosis not present

## 2020-11-10 DIAGNOSIS — M6388 Disorders of muscle in diseases classified elsewhere, other site: Secondary | ICD-10-CM | POA: Diagnosis not present

## 2020-11-10 DIAGNOSIS — M63812 Disorders of muscle in diseases classified elsewhere, left shoulder: Secondary | ICD-10-CM | POA: Diagnosis not present

## 2020-11-10 DIAGNOSIS — R278 Other lack of coordination: Secondary | ICD-10-CM | POA: Diagnosis not present

## 2020-11-10 DIAGNOSIS — M25512 Pain in left shoulder: Secondary | ICD-10-CM | POA: Diagnosis not present

## 2020-11-12 DIAGNOSIS — R278 Other lack of coordination: Secondary | ICD-10-CM | POA: Diagnosis not present

## 2020-11-12 DIAGNOSIS — M24812 Other specific joint derangements of left shoulder, not elsewhere classified: Secondary | ICD-10-CM | POA: Diagnosis not present

## 2020-11-12 DIAGNOSIS — M63812 Disorders of muscle in diseases classified elsewhere, left shoulder: Secondary | ICD-10-CM | POA: Diagnosis not present

## 2020-11-12 DIAGNOSIS — Z96612 Presence of left artificial shoulder joint: Secondary | ICD-10-CM | POA: Diagnosis not present

## 2020-11-12 DIAGNOSIS — M6388 Disorders of muscle in diseases classified elsewhere, other site: Secondary | ICD-10-CM | POA: Diagnosis not present

## 2020-11-12 DIAGNOSIS — M25512 Pain in left shoulder: Secondary | ICD-10-CM | POA: Diagnosis not present

## 2020-11-12 DIAGNOSIS — R600 Localized edema: Secondary | ICD-10-CM | POA: Diagnosis not present

## 2020-11-16 DIAGNOSIS — M63812 Disorders of muscle in diseases classified elsewhere, left shoulder: Secondary | ICD-10-CM | POA: Diagnosis not present

## 2020-11-16 DIAGNOSIS — J3089 Other allergic rhinitis: Secondary | ICD-10-CM | POA: Diagnosis not present

## 2020-11-16 DIAGNOSIS — M25512 Pain in left shoulder: Secondary | ICD-10-CM | POA: Diagnosis not present

## 2020-11-16 DIAGNOSIS — R600 Localized edema: Secondary | ICD-10-CM | POA: Diagnosis not present

## 2020-11-16 DIAGNOSIS — M6388 Disorders of muscle in diseases classified elsewhere, other site: Secondary | ICD-10-CM | POA: Diagnosis not present

## 2020-11-16 DIAGNOSIS — M24812 Other specific joint derangements of left shoulder, not elsewhere classified: Secondary | ICD-10-CM | POA: Diagnosis not present

## 2020-11-16 DIAGNOSIS — Z96612 Presence of left artificial shoulder joint: Secondary | ICD-10-CM | POA: Diagnosis not present

## 2020-11-16 DIAGNOSIS — R278 Other lack of coordination: Secondary | ICD-10-CM | POA: Diagnosis not present

## 2020-11-16 DIAGNOSIS — J301 Allergic rhinitis due to pollen: Secondary | ICD-10-CM | POA: Diagnosis not present

## 2020-11-17 ENCOUNTER — Ambulatory Visit (HOSPITAL_COMMUNITY)
Admission: RE | Admit: 2020-11-17 | Discharge: 2020-11-17 | Disposition: A | Discharge status: Home / Self Care | Payer: Medicare Other | Source: Ambulatory Visit | Attending: Nurse Practitioner | Admitting: Nurse Practitioner

## 2020-11-17 ENCOUNTER — Other Ambulatory Visit: Payer: Self-pay

## 2020-11-17 VITALS — BP 138/70 | HR 64 | Ht 70.0 in | Wt 212.2 lb

## 2020-11-17 DIAGNOSIS — Z09 Encounter for follow-up examination after completed treatment for conditions other than malignant neoplasm: Secondary | ICD-10-CM | POA: Diagnosis not present

## 2020-11-17 DIAGNOSIS — D6869 Other thrombophilia: Secondary | ICD-10-CM | POA: Diagnosis not present

## 2020-11-17 DIAGNOSIS — Z79899 Other long term (current) drug therapy: Secondary | ICD-10-CM | POA: Diagnosis not present

## 2020-11-17 DIAGNOSIS — Z87891 Personal history of nicotine dependence: Secondary | ICD-10-CM | POA: Diagnosis not present

## 2020-11-17 DIAGNOSIS — I480 Paroxysmal atrial fibrillation: Secondary | ICD-10-CM

## 2020-11-17 DIAGNOSIS — Z8249 Family history of ischemic heart disease and other diseases of the circulatory system: Secondary | ICD-10-CM | POA: Diagnosis not present

## 2020-11-17 DIAGNOSIS — Z7901 Long term (current) use of anticoagulants: Secondary | ICD-10-CM | POA: Diagnosis not present

## 2020-11-17 NOTE — Progress Notes (Signed)
Primary Care Physician: Leanna Battles, MD Referring Physician:Dr. Geroge Baseman Amner Knoedler. is a 75 y.o. male with a h/o paroxysmal afib that is in the afib clinic for onset afib last pm.  He  had ablation in 03/2018. He had to go back on daily flecainide and Cardizem as he had some stuttering afib after trying to stop antiarrythmic at 3 month f/u.  He has enjoyed sinus rhythm since being back on daily meds until last night. He also had Rocky Spotted Tick fever within the last few weeks and was treated with doxycycline. He had a tooth pulled 2/2 to infection last week and just finished amoxicillin. He stopped anticoagulation for 1 day  then resumed Friday. He is also having new onset of pain in his rt leg that his PCP does not think is related to tick bite but possibly 2/2 back issues. He tried an extra 30 mg Cardizem last night but felt like it bottomed out his BP.   F/u in afib clinic, 11/17/19. He had a repeat ablation last week for increase in afib burden. Over the weekend, he felt weak and lightheaded. Yesterday, he did not feel weill with HR's in the 40's to the 70's. He did not feel this to be afib but would note several regular bests then a pause in his  heart rhythm. He felt  lightheaded with this. When this was happening  he also felt "hollow" in his chest. Today feels better and EKG shows NSR. His flecainide and CCB was stopped at time of ablation.   F/u in afib clinic 12/17/19, one month s/p ablation. He feels great. No afib to appreciate. Had palpitations early on after ablation that sounded more like PC's, these have resolved. No swallowing or groin issues.   F/u in afib clinic, 07/22/20. He is being seen urgently as he went out of rhythm yesterday with v rates around 150 bpm. . He has been sick for the last 3 weeks with pneumonia and just a few days ago was placed on another  antibiotic and prednisone. He was placed back on diltiazem 180 mg daily yesterday pm and this am he felt as  though he may be back in rhythm for short periods of time. I feel the infection and prednisone may have been the trigger and may self convert when he is over the meds/infections. He remains on eliquis 5 mg bid for a CHA2DS2VASc of 2.          F/u in afib clinic, 11/17/20. He continues to have break thru afib once a week, longest episode was 20 hours.  He uses his 30 mg Cardizem and will take one extra 100 mg flecainide if needed to convert to SR. Usually this is successful. We discussed other antiarrythmic's if afib burden escalates. He is planning a trip to the [place] in late September.           Today, he denies symptoms of palpitations, chest pain, shortness of breath, orthopnea, PND, lower extremity edema, dizziness, presyncope, syncope, or neurologic sequela. The patient is tolerating medications without difficulties and is otherwise without complaint today.   Past Medical History:  Diagnosis Date   Aortic atherosclerosis ([place])    Arthritis    Asthma    Diverticulitis    Diverticulosis    DVT (deep venous thrombosis) ([place]) 2019   right leg after back surgery   Dysrhythmia    A-fib every few weeks   Ectatic thoracic aorta ([place])  a. 4.3cm by CT 03/2018.   GERD (gastroesophageal reflux disease)    Hemorrhoids    History of echocardiogram    a. Echo 10/16 ([facility]):  EF 55%, trace MR, mild TR, mild to mod AI, mild dilated Ao root (41 mm)   Hyperlipemia    Organic erectile dysfunction    PAF (paroxysmal atrial fibrillation) ([place])    a. admx to [facility] in [place], [place] 10/16 with AF with RVR >> converted to NSR with IV Dilt;  b. Flecainide started >> ETT neg for pro-arrhythmia    Pneumonia 06/2020   Prostate cancer ([place]) 04/25/2006   Gleason 3+4=7   Prostatitis    S/P cardiac cath    a. LHC at [facility] 10/16:  LM ok, mLAD 30%, LCx ok, dRCA 20%   S/P radiation therapy 01/19/2014 through 03/05/2014                                                      Prostate bed  6600 cGy in 33 sessions                           Torn rotator cuff    right  40% tear   Past Surgical History:  Procedure Laterality Date   ABLATION OF DYSRHYTHMIC FOCUS  03/28/2018   APPENDECTOMY  1967   ATRIAL FIBRILLATION ABLATION N/A 03/28/2018   Procedure: ATRIAL FIBRILLATION ABLATION;  Surgeon: [name], MD;  Location: [facility];  Service: Cardiovascular;  Laterality: N/A;   ATRIAL FIBRILLATION ABLATION N/A 11/11/2019   Procedure: ATRIAL FIBRILLATION ABLATION;  Surgeon: [name], MD;  Location: [facility];  Service: Cardiovascular;  Laterality: N/A;   BACK SURGERY  2019   CARDIAC CATHETERIZATION  2016   [place]   CATARACT EXTRACTION W/ INTRAOCULAR LENS IMPLANT Left 11/2011   CATARACT EXTRACTION W/ INTRAOCULAR LENS IMPLANT Right 09/2015   COLONOSCOPY     LARYNX SURGERY  2000   vocal cord lesion- benign   LUMBAR LAMINECTOMY/DECOMPRESSION MICRODISCECTOMY Right 07/26/2017   Procedure: RIGHT LUMBAR 3- LUMBAR 4 FORAMINOTOMY WITH RESECTION OF SYNOVIAL CYST;  Surgeon: Erline Levine, MD;  Location: [facility];  Service: Neurosurgery;  Laterality: Right;  RIGHT LUMBAR 3- LUMBAR 4 FORAMINOTOMY WITH RESECTION OF SYNOVIAL CYST   MENISCUS REPAIR Right    POLYPECTOMY     PROSTATE BIOPSY  1999   PROSTATECTOMY  04/25/2006   REVERSE SHOULDER ARTHROPLASTY Left 09/02/2020   Procedure: REVERSE SHOULDER ARTHROPLASTY;  Surgeon: [name], MD;  Location[facility];  Service: Orthopedics;  Laterality: Left;  173mn    Current Outpatient Medications  Medication Sig Dispense Refill   acetaminophen (TYLENOL) 500 MG tablet Take 500 mg by mouth 2 (two) times daily as needed.     apixaban (ELIQUIS) 5 MG TABS tablet Take 5 mg by mouth 2 (two) times daily.      atorvastatin (LIPITOR) 40 MG tablet Take 20 mg by mouth every evening.     chlorpheniramine (CHLOR-TRIMETON) 4 MG tablet Take 4 mg by mouth daily.     diltiazem (CARDIZEM CD) 180 MG 24 hr capsule Take 1 capsule (180 mg total) by  mouth daily in the afternoon. 30 capsule 3   diltiazem (CARDIZEM) 30 MG tablet TAKE 1 TABLET EVERY 4  HOURS AS NEEDED FOR AFIB RAPID HEART RATE OVER 100 90 tablet 1   EPINEPHrine 0.3 mg/0.3 mL IJ SOAJ injection Inject 0.3 mg into the muscle as needed for anaphylaxis.     flecainide (TAMBOCOR) 100 MG tablet Take 1 tablet (100 mg total) by mouth 2 (two) times daily. 180 tablet 3   Mometasone Furoate (ASMANEX HFA) 100 MCG/ACT AERO Inhale 1 puff into the lungs every evening.     montelukast (SINGULAIR) 10 MG tablet Take 10 mg by mouth at bedtime.     Naphazoline-Pheniramine (OPCON-A) 0.027-0.315 % SOLN Place 1 drop into both eyes 2 (two) times daily as needed (eye allergies (red/irritated eyes)).     NON FORMULARY Allergy Shots every 3 weeks     omeprazole (PRILOSEC) 20 MG capsule Take 20 mg by mouth 2 (two) times daily before a meal.      Probiotic Product (ALIGN) 4 MG CAPS Take 4 mg by mouth every evening.     psyllium (METAMUCIL) 58.6 % powder Take 1 packet by mouth 2 (two) times daily.     VENTOLIN HFA 108 (90 Base) MCG/ACT inhaler Inhale 1-2 puffs into the lungs every 6 (six) hours as needed for wheezing or shortness of breath.     azithromycin (ZITHROMAX) 250 MG tablet TAKE 2 TABLETS BY MOUTH TODAY, THEN TAKE 1 TABLET DAILY FOR 4 DAYS (Patient not taking: Reported on 11/17/2020)     LAGEVRIO 200 MG CAPS SMARTSIG:4 Capsule(s) By Mouth Every 12 Hours (Patient not taking: Reported on 11/17/2020)     zolpidem (AMBIEN) 10 MG tablet Take 10 mg by mouth at bedtime as needed.     No current facility-administered medications for this encounter.    No Known Allergies  Social History   Socioeconomic History   Marital status: Married    Spouse name: Romie Minus   Number of children: 2   Years of education: Not on file   Highest education level: Not on file  Occupational History   Occupation: Immunologist GDS   Occupation: Optometrist  Tobacco Use   Smoking status: Former    Packs/day: 1.00    Years:  10.00    Pack years: 10.00    Types: Cigarettes    Quit date: 1972    Years since quitting: 50.6   Smokeless tobacco: Never   Tobacco comments:    1972  Vaping Use   Vaping Use: Never used  Substance and Sexual Activity   Alcohol use: Not Currently   Drug use: No   Sexual activity: Not on file  Other Topics Concern   Not on file  Social History Narrative   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  Consultant for schools.   Travels over seas    Social Determinants of Health   Financial Resource Strain: Not on file  Food Insecurity: Not on file  Transportation Needs: Not on file  Physical Activity: Not on file  Stress: Not on file  Social Connections: Not on file  Intimate Partner Violence: Not on file    Family History  Problem Relation Age of Onset   Emphysema Father    Lung cancer Father    Hypertension Mother    Heart disease Mother        heart attack   Arthritis Mother    Diabetes Maternal Grandmother    Heart disease Maternal Grandfather        Died age 73   Liver cancer Paternal Grandmother    Heart disease Paternal Grandfather  Died age 34   Atrial fibrillation Son    Stomach cancer Neg Hx    Rectal cancer Neg Hx    Esophageal cancer Neg Hx    Colon cancer Neg Hx     ROS- All systems are reviewed and negative except as per the HPI above  Physical Exam: Vitals:   11/17/20 1125  BP: 138/70  Pulse: 64  Weight: 96.3 kg  Height: '5\' 10"'$  (1.778 m)   Wt Readings from Last 3 Encounters:  11/17/20 96.3 kg  09/06/20 96.3 kg  09/03/20 96.3 kg    Labs: Lab Results  Component Value Date   NA 140 08/24/2020   K 3.7 08/24/2020   CL 108 08/24/2020   CO2 28 08/24/2020   GLUCOSE 131 (H) 08/24/2020   BUN 19 08/24/2020   CREATININE 1.16 08/24/2020   CALCIUM 9.5 08/24/2020   MG 2.2 02/11/2018   Lab Results  Component Value Date   INR 1.04 02/11/2018   No results found for: CHOL, HDL, LDLCALC, TRIG   GEN- The patient is well appearing, alert and oriented  x 3 today.   Head- normocephalic, atraumatic Eyes-  Sclera clear, conjunctiva pink Ears- hearing intact Oropharynx- clear Neck- supple, no JVP Lymph- no cervical lymphadenopathy Lungs- Clear to ausculation bilaterally, normal work of breathing Heart regular rate and rhythm, no murmurs, rubs or gallops, PMI not laterally displaced GI- soft, NT, ND, + BS Extremities- no clubbing, cyanosis, or edema MS- no significant deformity or atrophy Skin- no rash or lesion Psych- euthymic mood, full affect Neuro- strength and sensation are intact  EKG- NSR at 64 bpm, pr int 186 ms, qrs int 104 ms, qtc 410 ms   Assessment and Plan: 1. Paroxysmal afib S/p ablation 03/2018 and repeat ablation 11/11/19 Reasonable control of breakthrough afib with flecainide and metoprolol  We discussed how to take meds to convert afib to SR if needed on his trip, basically he is currently managing his afib at home  If his afib burden escalates, he may be interested in changing over to tikosyn   2. Chadsvasc score of 3  Continue  eliquis 5 mg bid    F/u with Dr. Rayann Heman as scheduled   Geroge Baseman. Mantaj Chamberlin, [facility] [address] [place], [place] [address] [phone]

## 2020-11-18 ENCOUNTER — Other Ambulatory Visit (HOSPITAL_COMMUNITY): Payer: Self-pay | Admitting: Nurse Practitioner

## 2020-11-18 ENCOUNTER — Other Ambulatory Visit (HOSPITAL_COMMUNITY): Payer: Self-pay | Admitting: Internal Medicine

## 2020-11-19 ENCOUNTER — Other Ambulatory Visit (HOSPITAL_COMMUNITY): Payer: Self-pay | Admitting: *Deleted

## 2020-11-19 DIAGNOSIS — Z96612 Presence of left artificial shoulder joint: Secondary | ICD-10-CM | POA: Diagnosis not present

## 2020-11-19 DIAGNOSIS — M6388 Disorders of muscle in diseases classified elsewhere, other site: Secondary | ICD-10-CM | POA: Diagnosis not present

## 2020-11-19 DIAGNOSIS — M63812 Disorders of muscle in diseases classified elsewhere, left shoulder: Secondary | ICD-10-CM | POA: Diagnosis not present

## 2020-11-19 DIAGNOSIS — R278 Other lack of coordination: Secondary | ICD-10-CM | POA: Diagnosis not present

## 2020-11-19 DIAGNOSIS — M25512 Pain in left shoulder: Secondary | ICD-10-CM | POA: Diagnosis not present

## 2020-11-19 DIAGNOSIS — M24812 Other specific joint derangements of left shoulder, not elsewhere classified: Secondary | ICD-10-CM | POA: Diagnosis not present

## 2020-11-19 DIAGNOSIS — R600 Localized edema: Secondary | ICD-10-CM | POA: Diagnosis not present

## 2020-11-19 MED ORDER — FLECAINIDE ACETATE 100 MG PO TABS: 100.0000 mg | ORAL_TABLET | Freq: Two times a day (BID) | ORAL | 2 refills | Status: DC

## 2020-11-20 DIAGNOSIS — H9209 Otalgia, unspecified ear: Secondary | ICD-10-CM | POA: Diagnosis not present

## 2020-11-20 DIAGNOSIS — H60502 Unspecified acute noninfective otitis externa, left ear: Secondary | ICD-10-CM | POA: Diagnosis not present

## 2020-11-26 DIAGNOSIS — Z96612 Presence of left artificial shoulder joint: Secondary | ICD-10-CM | POA: Diagnosis not present

## 2020-11-26 DIAGNOSIS — Z471 Aftercare following joint replacement surgery: Secondary | ICD-10-CM | POA: Diagnosis not present

## 2020-11-29 DIAGNOSIS — M25512 Pain in left shoulder: Secondary | ICD-10-CM | POA: Diagnosis not present

## 2020-11-29 DIAGNOSIS — R600 Localized edema: Secondary | ICD-10-CM | POA: Diagnosis not present

## 2020-11-29 DIAGNOSIS — R278 Other lack of coordination: Secondary | ICD-10-CM | POA: Diagnosis not present

## 2020-11-29 DIAGNOSIS — M6388 Disorders of muscle in diseases classified elsewhere, other site: Secondary | ICD-10-CM | POA: Diagnosis not present

## 2020-11-29 DIAGNOSIS — M63812 Disorders of muscle in diseases classified elsewhere, left shoulder: Secondary | ICD-10-CM | POA: Diagnosis not present

## 2020-11-29 DIAGNOSIS — M24812 Other specific joint derangements of left shoulder, not elsewhere classified: Secondary | ICD-10-CM | POA: Diagnosis not present

## 2020-11-29 DIAGNOSIS — Z96612 Presence of left artificial shoulder joint: Secondary | ICD-10-CM | POA: Diagnosis not present

## 2020-11-30 DIAGNOSIS — J301 Allergic rhinitis due to pollen: Secondary | ICD-10-CM | POA: Diagnosis not present

## 2020-11-30 DIAGNOSIS — J3089 Other allergic rhinitis: Secondary | ICD-10-CM | POA: Diagnosis not present

## 2020-12-01 DIAGNOSIS — M24812 Other specific joint derangements of left shoulder, not elsewhere classified: Secondary | ICD-10-CM | POA: Diagnosis not present

## 2020-12-01 DIAGNOSIS — M63812 Disorders of muscle in diseases classified elsewhere, left shoulder: Secondary | ICD-10-CM | POA: Diagnosis not present

## 2020-12-01 DIAGNOSIS — M6388 Disorders of muscle in diseases classified elsewhere, other site: Secondary | ICD-10-CM | POA: Diagnosis not present

## 2020-12-01 DIAGNOSIS — R278 Other lack of coordination: Secondary | ICD-10-CM | POA: Diagnosis not present

## 2020-12-01 DIAGNOSIS — Z23 Encounter for immunization: Secondary | ICD-10-CM | POA: Diagnosis not present

## 2020-12-01 DIAGNOSIS — R600 Localized edema: Secondary | ICD-10-CM | POA: Diagnosis not present

## 2020-12-01 DIAGNOSIS — Z96612 Presence of left artificial shoulder joint: Secondary | ICD-10-CM | POA: Diagnosis not present

## 2020-12-01 DIAGNOSIS — M25512 Pain in left shoulder: Secondary | ICD-10-CM | POA: Diagnosis not present

## 2020-12-02 DIAGNOSIS — C61 Malignant neoplasm of prostate: Secondary | ICD-10-CM | POA: Diagnosis not present

## 2020-12-03 DIAGNOSIS — M25512 Pain in left shoulder: Secondary | ICD-10-CM | POA: Diagnosis not present

## 2020-12-03 DIAGNOSIS — R278 Other lack of coordination: Secondary | ICD-10-CM | POA: Diagnosis not present

## 2020-12-03 DIAGNOSIS — M6388 Disorders of muscle in diseases classified elsewhere, other site: Secondary | ICD-10-CM | POA: Diagnosis not present

## 2020-12-03 DIAGNOSIS — R600 Localized edema: Secondary | ICD-10-CM | POA: Diagnosis not present

## 2020-12-03 DIAGNOSIS — M24812 Other specific joint derangements of left shoulder, not elsewhere classified: Secondary | ICD-10-CM | POA: Diagnosis not present

## 2020-12-03 DIAGNOSIS — M63812 Disorders of muscle in diseases classified elsewhere, left shoulder: Secondary | ICD-10-CM | POA: Diagnosis not present

## 2020-12-03 DIAGNOSIS — Z96612 Presence of left artificial shoulder joint: Secondary | ICD-10-CM | POA: Diagnosis not present

## 2020-12-06 DIAGNOSIS — Z96612 Presence of left artificial shoulder joint: Secondary | ICD-10-CM | POA: Diagnosis not present

## 2020-12-06 DIAGNOSIS — R600 Localized edema: Secondary | ICD-10-CM | POA: Diagnosis not present

## 2020-12-06 DIAGNOSIS — M63812 Disorders of muscle in diseases classified elsewhere, left shoulder: Secondary | ICD-10-CM | POA: Diagnosis not present

## 2020-12-06 DIAGNOSIS — M25512 Pain in left shoulder: Secondary | ICD-10-CM | POA: Diagnosis not present

## 2020-12-06 DIAGNOSIS — M6388 Disorders of muscle in diseases classified elsewhere, other site: Secondary | ICD-10-CM | POA: Diagnosis not present

## 2020-12-06 DIAGNOSIS — R278 Other lack of coordination: Secondary | ICD-10-CM | POA: Diagnosis not present

## 2020-12-06 DIAGNOSIS — M24812 Other specific joint derangements of left shoulder, not elsewhere classified: Secondary | ICD-10-CM | POA: Diagnosis not present

## 2020-12-08 DIAGNOSIS — M25512 Pain in left shoulder: Secondary | ICD-10-CM | POA: Diagnosis not present

## 2020-12-08 DIAGNOSIS — M6388 Disorders of muscle in diseases classified elsewhere, other site: Secondary | ICD-10-CM | POA: Diagnosis not present

## 2020-12-08 DIAGNOSIS — R600 Localized edema: Secondary | ICD-10-CM | POA: Diagnosis not present

## 2020-12-08 DIAGNOSIS — M24812 Other specific joint derangements of left shoulder, not elsewhere classified: Secondary | ICD-10-CM | POA: Diagnosis not present

## 2020-12-08 DIAGNOSIS — Z96612 Presence of left artificial shoulder joint: Secondary | ICD-10-CM | POA: Diagnosis not present

## 2020-12-08 DIAGNOSIS — R278 Other lack of coordination: Secondary | ICD-10-CM | POA: Diagnosis not present

## 2020-12-08 DIAGNOSIS — M63812 Disorders of muscle in diseases classified elsewhere, left shoulder: Secondary | ICD-10-CM | POA: Diagnosis not present

## 2020-12-10 DIAGNOSIS — M25512 Pain in left shoulder: Secondary | ICD-10-CM | POA: Diagnosis not present

## 2020-12-10 DIAGNOSIS — M6388 Disorders of muscle in diseases classified elsewhere, other site: Secondary | ICD-10-CM | POA: Diagnosis not present

## 2020-12-10 DIAGNOSIS — M63812 Disorders of muscle in diseases classified elsewhere, left shoulder: Secondary | ICD-10-CM | POA: Diagnosis not present

## 2020-12-10 DIAGNOSIS — Z96612 Presence of left artificial shoulder joint: Secondary | ICD-10-CM | POA: Diagnosis not present

## 2020-12-10 DIAGNOSIS — M24812 Other specific joint derangements of left shoulder, not elsewhere classified: Secondary | ICD-10-CM | POA: Diagnosis not present

## 2020-12-10 DIAGNOSIS — R600 Localized edema: Secondary | ICD-10-CM | POA: Diagnosis not present

## 2020-12-10 DIAGNOSIS — R278 Other lack of coordination: Secondary | ICD-10-CM | POA: Diagnosis not present

## 2020-12-13 DIAGNOSIS — M25512 Pain in left shoulder: Secondary | ICD-10-CM | POA: Diagnosis not present

## 2020-12-13 DIAGNOSIS — M6388 Disorders of muscle in diseases classified elsewhere, other site: Secondary | ICD-10-CM | POA: Diagnosis not present

## 2020-12-13 DIAGNOSIS — Z96612 Presence of left artificial shoulder joint: Secondary | ICD-10-CM | POA: Diagnosis not present

## 2020-12-13 DIAGNOSIS — R278 Other lack of coordination: Secondary | ICD-10-CM | POA: Diagnosis not present

## 2020-12-13 DIAGNOSIS — R600 Localized edema: Secondary | ICD-10-CM | POA: Diagnosis not present

## 2020-12-13 DIAGNOSIS — M24812 Other specific joint derangements of left shoulder, not elsewhere classified: Secondary | ICD-10-CM | POA: Diagnosis not present

## 2020-12-13 DIAGNOSIS — M63812 Disorders of muscle in diseases classified elsewhere, left shoulder: Secondary | ICD-10-CM | POA: Diagnosis not present

## 2020-12-14 DIAGNOSIS — J301 Allergic rhinitis due to pollen: Secondary | ICD-10-CM | POA: Diagnosis not present

## 2020-12-14 DIAGNOSIS — J3089 Other allergic rhinitis: Secondary | ICD-10-CM | POA: Diagnosis not present

## 2021-01-19 ENCOUNTER — Other Ambulatory Visit (HOSPITAL_COMMUNITY): Payer: Self-pay | Admitting: Internal Medicine

## 2021-01-20 DIAGNOSIS — I4891 Unspecified atrial fibrillation: Secondary | ICD-10-CM | POA: Diagnosis not present

## 2021-01-20 DIAGNOSIS — I2510 Atherosclerotic heart disease of native coronary artery without angina pectoris: Secondary | ICD-10-CM | POA: Diagnosis not present

## 2021-01-20 DIAGNOSIS — J00 Acute nasopharyngitis [common cold]: Secondary | ICD-10-CM | POA: Diagnosis not present

## 2021-01-20 DIAGNOSIS — L03011 Cellulitis of right finger: Secondary | ICD-10-CM | POA: Diagnosis not present

## 2021-01-20 DIAGNOSIS — Z7901 Long term (current) use of anticoagulants: Secondary | ICD-10-CM | POA: Diagnosis not present

## 2021-01-20 DIAGNOSIS — R051 Acute cough: Secondary | ICD-10-CM | POA: Diagnosis not present

## 2021-01-21 DIAGNOSIS — J301 Allergic rhinitis due to pollen: Secondary | ICD-10-CM | POA: Diagnosis not present

## 2021-01-21 DIAGNOSIS — J3089 Other allergic rhinitis: Secondary | ICD-10-CM | POA: Diagnosis not present

## 2021-01-24 DIAGNOSIS — M25512 Pain in left shoulder: Secondary | ICD-10-CM | POA: Diagnosis not present

## 2021-01-24 DIAGNOSIS — Z96612 Presence of left artificial shoulder joint: Secondary | ICD-10-CM | POA: Diagnosis not present

## 2021-01-24 DIAGNOSIS — M6388 Disorders of muscle in diseases classified elsewhere, other site: Secondary | ICD-10-CM | POA: Diagnosis not present

## 2021-01-24 DIAGNOSIS — R278 Other lack of coordination: Secondary | ICD-10-CM | POA: Diagnosis not present

## 2021-01-24 DIAGNOSIS — M24812 Other specific joint derangements of left shoulder, not elsewhere classified: Secondary | ICD-10-CM | POA: Diagnosis not present

## 2021-01-24 DIAGNOSIS — M63812 Disorders of muscle in diseases classified elsewhere, left shoulder: Secondary | ICD-10-CM | POA: Diagnosis not present

## 2021-01-24 DIAGNOSIS — R600 Localized edema: Secondary | ICD-10-CM | POA: Diagnosis not present

## 2021-01-27 DIAGNOSIS — J3089 Other allergic rhinitis: Secondary | ICD-10-CM | POA: Diagnosis not present

## 2021-01-27 DIAGNOSIS — J301 Allergic rhinitis due to pollen: Secondary | ICD-10-CM | POA: Diagnosis not present

## 2021-01-28 DIAGNOSIS — M25512 Pain in left shoulder: Secondary | ICD-10-CM | POA: Diagnosis not present

## 2021-01-28 DIAGNOSIS — R600 Localized edema: Secondary | ICD-10-CM | POA: Diagnosis not present

## 2021-01-28 DIAGNOSIS — M6388 Disorders of muscle in diseases classified elsewhere, other site: Secondary | ICD-10-CM | POA: Diagnosis not present

## 2021-01-28 DIAGNOSIS — M24812 Other specific joint derangements of left shoulder, not elsewhere classified: Secondary | ICD-10-CM | POA: Diagnosis not present

## 2021-01-28 DIAGNOSIS — M63812 Disorders of muscle in diseases classified elsewhere, left shoulder: Secondary | ICD-10-CM | POA: Diagnosis not present

## 2021-01-28 DIAGNOSIS — R278 Other lack of coordination: Secondary | ICD-10-CM | POA: Diagnosis not present

## 2021-01-28 DIAGNOSIS — Z96612 Presence of left artificial shoulder joint: Secondary | ICD-10-CM | POA: Diagnosis not present

## 2021-01-31 DIAGNOSIS — M6388 Disorders of muscle in diseases classified elsewhere, other site: Secondary | ICD-10-CM | POA: Diagnosis not present

## 2021-01-31 DIAGNOSIS — R278 Other lack of coordination: Secondary | ICD-10-CM | POA: Diagnosis not present

## 2021-01-31 DIAGNOSIS — M25512 Pain in left shoulder: Secondary | ICD-10-CM | POA: Diagnosis not present

## 2021-01-31 DIAGNOSIS — M24812 Other specific joint derangements of left shoulder, not elsewhere classified: Secondary | ICD-10-CM | POA: Diagnosis not present

## 2021-01-31 DIAGNOSIS — M63812 Disorders of muscle in diseases classified elsewhere, left shoulder: Secondary | ICD-10-CM | POA: Diagnosis not present

## 2021-01-31 DIAGNOSIS — Z96612 Presence of left artificial shoulder joint: Secondary | ICD-10-CM | POA: Diagnosis not present

## 2021-01-31 DIAGNOSIS — R600 Localized edema: Secondary | ICD-10-CM | POA: Diagnosis not present

## 2021-02-03 DIAGNOSIS — J3089 Other allergic rhinitis: Secondary | ICD-10-CM | POA: Diagnosis not present

## 2021-02-03 DIAGNOSIS — J301 Allergic rhinitis due to pollen: Secondary | ICD-10-CM | POA: Diagnosis not present

## 2021-02-07 DIAGNOSIS — R600 Localized edema: Secondary | ICD-10-CM | POA: Diagnosis not present

## 2021-02-07 DIAGNOSIS — M6388 Disorders of muscle in diseases classified elsewhere, other site: Secondary | ICD-10-CM | POA: Diagnosis not present

## 2021-02-07 DIAGNOSIS — Z96612 Presence of left artificial shoulder joint: Secondary | ICD-10-CM | POA: Diagnosis not present

## 2021-02-07 DIAGNOSIS — R278 Other lack of coordination: Secondary | ICD-10-CM | POA: Diagnosis not present

## 2021-02-07 DIAGNOSIS — M25512 Pain in left shoulder: Secondary | ICD-10-CM | POA: Diagnosis not present

## 2021-02-07 DIAGNOSIS — M63812 Disorders of muscle in diseases classified elsewhere, left shoulder: Secondary | ICD-10-CM | POA: Diagnosis not present

## 2021-02-07 DIAGNOSIS — M24812 Other specific joint derangements of left shoulder, not elsewhere classified: Secondary | ICD-10-CM | POA: Diagnosis not present

## 2021-02-09 ENCOUNTER — Encounter (HOSPITAL_COMMUNITY): Payer: Self-pay

## 2021-02-09 DIAGNOSIS — M25512 Pain in left shoulder: Secondary | ICD-10-CM | POA: Diagnosis not present

## 2021-02-09 DIAGNOSIS — M24812 Other specific joint derangements of left shoulder, not elsewhere classified: Secondary | ICD-10-CM | POA: Diagnosis not present

## 2021-02-09 DIAGNOSIS — M63812 Disorders of muscle in diseases classified elsewhere, left shoulder: Secondary | ICD-10-CM | POA: Diagnosis not present

## 2021-02-09 DIAGNOSIS — M6388 Disorders of muscle in diseases classified elsewhere, other site: Secondary | ICD-10-CM | POA: Diagnosis not present

## 2021-02-09 DIAGNOSIS — Z96612 Presence of left artificial shoulder joint: Secondary | ICD-10-CM | POA: Diagnosis not present

## 2021-02-09 DIAGNOSIS — R600 Localized edema: Secondary | ICD-10-CM | POA: Diagnosis not present

## 2021-02-09 DIAGNOSIS — R278 Other lack of coordination: Secondary | ICD-10-CM | POA: Diagnosis not present

## 2021-02-14 DIAGNOSIS — M25512 Pain in left shoulder: Secondary | ICD-10-CM | POA: Diagnosis not present

## 2021-02-14 DIAGNOSIS — R278 Other lack of coordination: Secondary | ICD-10-CM | POA: Diagnosis not present

## 2021-02-14 DIAGNOSIS — J3089 Other allergic rhinitis: Secondary | ICD-10-CM | POA: Diagnosis not present

## 2021-02-14 DIAGNOSIS — Z96612 Presence of left artificial shoulder joint: Secondary | ICD-10-CM | POA: Diagnosis not present

## 2021-02-14 DIAGNOSIS — R600 Localized edema: Secondary | ICD-10-CM | POA: Diagnosis not present

## 2021-02-14 DIAGNOSIS — M63812 Disorders of muscle in diseases classified elsewhere, left shoulder: Secondary | ICD-10-CM | POA: Diagnosis not present

## 2021-02-14 DIAGNOSIS — M24812 Other specific joint derangements of left shoulder, not elsewhere classified: Secondary | ICD-10-CM | POA: Diagnosis not present

## 2021-02-14 DIAGNOSIS — M6388 Disorders of muscle in diseases classified elsewhere, other site: Secondary | ICD-10-CM | POA: Diagnosis not present

## 2021-02-17 ENCOUNTER — Other Ambulatory Visit: Payer: Self-pay | Admitting: *Deleted

## 2021-02-17 ENCOUNTER — Other Ambulatory Visit: Payer: Self-pay | Admitting: Pulmonary Disease

## 2021-02-17 ENCOUNTER — Ambulatory Visit (INDEPENDENT_AMBULATORY_CARE_PROVIDER_SITE_OTHER): Payer: Medicare Other | Admitting: Pulmonary Disease

## 2021-02-17 ENCOUNTER — Encounter: Payer: Self-pay | Admitting: Pulmonary Disease

## 2021-02-17 ENCOUNTER — Ambulatory Visit (INDEPENDENT_AMBULATORY_CARE_PROVIDER_SITE_OTHER): Payer: Medicare Other

## 2021-02-17 ENCOUNTER — Other Ambulatory Visit: Payer: Self-pay

## 2021-02-17 ENCOUNTER — Telehealth: Payer: Self-pay | Admitting: Pulmonary Disease

## 2021-02-17 VITALS — BP 130/70 | HR 61 | Temp 98.5°F | Ht 70.0 in | Wt 216.8 lb

## 2021-02-17 DIAGNOSIS — R051 Acute cough: Secondary | ICD-10-CM

## 2021-02-17 DIAGNOSIS — J4530 Mild persistent asthma, uncomplicated: Secondary | ICD-10-CM

## 2021-02-17 DIAGNOSIS — R918 Other nonspecific abnormal finding of lung field: Secondary | ICD-10-CM | POA: Diagnosis not present

## 2021-02-17 DIAGNOSIS — U071 COVID-19: Secondary | ICD-10-CM | POA: Diagnosis not present

## 2021-02-17 MED ORDER — IPRATROPIUM-ALBUTEROL 0.5-2.5 (3) MG/3ML IN SOLN: 3.0000 mL | Freq: Three times a day (TID) | RESPIRATORY_TRACT | 5 refills | Status: DC

## 2021-02-17 NOTE — Patient Instructions (Signed)
You already have a Z-Pak.  I will advise you to take it We will call in a nebulizer and duo nebs 3 times a day Continue the Asmanex inhaler, Singulair  Please alert me when your CT scan from urology is completed so that I can review Follow-up in 1 to 2 months.

## 2021-02-17 NOTE — Telephone Encounter (Signed)
Can he come in for a visit and Chest x ray? I have an opening at 9:15 AM . Or can see APP

## 2021-02-17 NOTE — Telephone Encounter (Signed)
Called and spoke to pt. Pt states he was diagnosed with COVID in October and was given an antiviral. Pt states he was improving but his cough seemed to linger. Pt states his cough never resolved and keeps him up at night. Pt states his PCP advised him to alternate taking hydromet cough syrup and a "partial hydrocodone" to help him sleep. Pt states the cough is wearing him down. Pt last seen in 08/2020 by Dr. Vaughan Browner for CAP and advised to follow up in 3-4 months. Pt has been scheduled today with Dr. Vaughan Browner at 3:45pm.   Will forward to Dr. Vaughan Browner as Juluis Rainier.

## 2021-02-17 NOTE — Telephone Encounter (Signed)
Called and spoke with patient, offered appointment for am at 9:15 am.  He stated he was sitting in the waiting room waiting to see Dr. Vaughan Browner at 3:45 pm.  CXR order placed and x-ray advised he needs an x-ray.  Nothing further needed.

## 2021-02-17 NOTE — Progress Notes (Signed)
Brandon Simmons    [id number]    Dec 19, 1945  Primary Care Physician:Paterson, Quillian Quince, MD  Referring Physician: Leanna Battles, [place],  [place] [address]   Chief complaint: Follow-up for pneumonia, mild asthma  HPI: 75 year old with history of arrhythmias, asthma, prostate cancer, DVT Developed community-acquired pneumonia in mid April.  Symptoms started off with a upper respiratory viral infection and proceeded to cough, fevers of 102.  He was evaluated in the ED on 4/22 and given amoxicillin with no improvement.  He was reevaluated on 4/24 and doxycycline added.  He got a round of prednisone as well.  He was given breztri from his primary care doctor but had to stop after few days as he developed sore throat and hoarseness.  He then got levofloxacin and prednisone for 7 days.  Unfortunately developed atrial fibrillation transiently while on prednisone but converted back to sinus rhythm.  Overall improved with this treatment  History notable for mild asthma for which he sees Dr. Remus Blake at allergy center.  Treated with Asmanex inhaler, albuterol. 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  No feather pillows or comforters Smoking history: Minimal smoking history in his 65s Travel history: No significant travel history Relevant family history: No family history of lung disease  Interim history: He has done better over the year however developed COVID-19 in October 2022 after a trip to [place].  Treated with monotherapy with but has persistent cough since then.  He had been prescribed Hydromet by his primary care but is not helping   Outpatient Encounter Medications as of 02/17/2021  Medication Sig   acetaminophen (TYLENOL) 500 MG tablet Take 500 mg by mouth 2  (two) times daily as needed.   apixaban (ELIQUIS) 5 MG TABS tablet Take 5 mg by mouth 2 (two) times daily.    atorvastatin (LIPITOR) 20 MG tablet Take 20 mg by mouth daily.   chlorpheniramine (CHLOR-TRIMETON) 4 MG tablet Take 4 mg by mouth daily.   diltiazem (CARDIZEM CD) 180 MG 24 hr capsule TAKE 1 CAPSULE (180 MG TOTAL) BY MOUTH DAILY IN THE AFTERNOON.   EPINEPHrine 0.3 mg/0.3 mL IJ SOAJ injection Inject 0.3 mg into the muscle as needed for anaphylaxis.   flecainide (TAMBOCOR) 100 MG tablet Take 1 tablet (100 mg total) by mouth 2 (two) times daily. May take extra tablet for breakthrough afib   GLUCOSAMINE-CHONDROITIN PO Take 1,500 mg by mouth daily.   HYDROcodone-acetaminophen (NORCO/VICODIN) 5-325 MG tablet Take 1 tablet by mouth 3 (three) times daily as needed.   HYDROCODONE-HOMATROPINE PO Take by mouth. 2-4 ml every other night   Mometasone Furoate (ASMANEX HFA) 100 MCG/ACT AERO Inhale 1 puff into the lungs every evening.   montelukast (SINGULAIR) 10 MG tablet Take 10 mg by mouth at bedtime.   NON FORMULARY Allergy Shots every 3 weeks   omeprazole (PRILOSEC) 20 MG capsule Take 20 mg by mouth 2 (two) times daily before a meal.    predniSONE (DELTASONE) 10 MG tablet 10 mg. 10- 20mg  as needed   Probiotic Product (ALIGN) 4 MG CAPS Take 4 mg by mouth every evening.   psyllium (METAMUCIL) 58.6 % powder Take 1 packet by mouth 2 (two) times daily.   VENTOLIN HFA 108 (90  Base) MCG/ACT inhaler Inhale 1-2 puffs into the lungs every 6 (six) hours as needed for wheezing or shortness of breath.   zolpidem (AMBIEN) 10 MG tablet Take 10 mg by mouth at bedtime as needed.   [DISCONTINUED] atorvastatin (LIPITOR) 40 MG tablet Take 20 mg by mouth every evening.   [DISCONTINUED] diltiazem (CARDIZEM) 30 MG tablet TAKE 1 TABLET EVERY 4 HOURS AS NEEDED FOR AFIB RAPID HEART RATE OVER 100   azithromycin (ZITHROMAX) 250 MG tablet TAKE 2 TABLETS BY MOUTH TODAY, THEN TAKE 1 TABLET DAILY FOR 4 DAYS (Patient not taking:  Reported on 11/17/2020)   LAGEVRIO 200 MG CAPS SMARTSIG:4 Capsule(s) By Mouth Every 12 Hours (Patient not taking: Reported on 11/17/2020)   Naphazoline-Pheniramine (OPCON-A) 0.027-0.315 % SOLN Place 1 drop into both eyes 2 (two) times daily as needed (eye allergies (red/irritated eyes)). (Patient not taking: Reported on 02/17/2021)   No facility-administered encounter medications on file as of 02/17/2021.    Physical Exam: Blood pressure 130/70, pulse 61, temperature 98.5 F (36.9 C), temperature source Oral, height 5\' 10"  (1.778 m), weight 216 lb 12.8 oz (98.3 kg), SpO2 97 %. Gen:      No acute distress HEENT:  EOMI, sclera anicteric Neck:     No masses; no thyromegaly Lungs:    Clear to auscultation bilaterally; normal respiratory effort CV:         Regular rate and rhythm; no murmurs Abd:      + bowel sounds; soft, non-tender; no palpable masses, no distension Ext:    No edema; adequate peripheral perfusion Skin:      Warm and dry; no rash Neuro: alert and oriented x 3 Psych: normal mood and affect   Data Reviewed: Imaging: Chest x-ray 07/09/2020- minimal basal atelectasis Chest x-ray 07/11/2020- patchy bibasilar opacities left greater than right Chest x-ray report from primary care 08/24/2020-slight improvement in bilateral infiltrates PET scan 10/13/2020-pulmonary nodule in the left lower lobe with limited uptake.  Architectural distortion in the left lower lobe. Chest x-ray 02/17/2021-persistent left lower lobe opacity I have reviewed the images personally.  PFTs:  Labs:  Assessment:  Cough He was previously treated for community-acquired pneumonia earlier this year requiring multiple rounds of antibiotics and prednisone Now had COVID infection in October 2022 with persistent cough since then.  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  Chest x-ray today reviewed with no infiltrate though he does have a persistent left lower lobe opacity that will need follow-up  Treat with Z-Pak, nebulizer  and duo nebs 3 times a day We discussed addition of prednisone to reduce lung inflammation but he would like to avoid as he developed atrial fibrillation the last time he took it  Abnormal chest x-ray Chest x-ray today with persistent left lower lobe opacity previously noted on PET scan in September.  He will need dedicated CT of the chest as a follow-up.  He is scheduled for a whole-body scan ordered by Dr. Wynetta Emery, urology for follow-up of prostate cancer.  I will review this when available  Asthma Continues on steroid inhaler, albuterol as needed.   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

## 2021-02-18 ENCOUNTER — Ambulatory Visit: Payer: Medicare Other | Admitting: Pulmonary Disease

## 2021-02-18 ENCOUNTER — Encounter: Payer: Self-pay | Admitting: Pulmonary Disease

## 2021-02-18 ENCOUNTER — Other Ambulatory Visit: Payer: Self-pay | Admitting: Pulmonary Disease

## 2021-02-18 DIAGNOSIS — M6388 Disorders of muscle in diseases classified elsewhere, other site: Secondary | ICD-10-CM | POA: Diagnosis not present

## 2021-02-18 DIAGNOSIS — M63812 Disorders of muscle in diseases classified elsewhere, left shoulder: Secondary | ICD-10-CM | POA: Diagnosis not present

## 2021-02-18 DIAGNOSIS — R600 Localized edema: Secondary | ICD-10-CM | POA: Diagnosis not present

## 2021-02-18 DIAGNOSIS — M24812 Other specific joint derangements of left shoulder, not elsewhere classified: Secondary | ICD-10-CM | POA: Diagnosis not present

## 2021-02-18 DIAGNOSIS — R278 Other lack of coordination: Secondary | ICD-10-CM | POA: Diagnosis not present

## 2021-02-18 DIAGNOSIS — M25512 Pain in left shoulder: Secondary | ICD-10-CM | POA: Diagnosis not present

## 2021-02-18 DIAGNOSIS — Z96612 Presence of left artificial shoulder joint: Secondary | ICD-10-CM | POA: Diagnosis not present

## 2021-02-18 NOTE — Telephone Encounter (Signed)
Ok. Noted  I will keep a look out for the scan. Thanks

## 2021-02-18 NOTE — Addendum Note (Signed)
Addended by: Elton Sin on: 02/18/2021 10:15 AM   Modules accepted: Orders

## 2021-02-18 NOTE — Telephone Encounter (Signed)
FYI for PM  Thank you again for seeing me on such short notice, Dr. Vaughan Browner. I told you yesterday that my cough was producing clear, thick mucus. That was true yesterday, but this morning my cough produced thick yellow mucus. Just wanted you to know that. I am glad that you prescribed the Z-Pack.  My CT scan will be on December 9. I checked today, and it will be a scan dedicated only to the lungs. I have asked Dr. Lynne Logan office to be sure that you get a copy of the results.  Happy Weekend!  Joycie Peek

## 2021-02-21 DIAGNOSIS — M6388 Disorders of muscle in diseases classified elsewhere, other site: Secondary | ICD-10-CM | POA: Diagnosis not present

## 2021-02-21 DIAGNOSIS — R600 Localized edema: Secondary | ICD-10-CM | POA: Diagnosis not present

## 2021-02-21 DIAGNOSIS — M63812 Disorders of muscle in diseases classified elsewhere, left shoulder: Secondary | ICD-10-CM | POA: Diagnosis not present

## 2021-02-21 DIAGNOSIS — M24812 Other specific joint derangements of left shoulder, not elsewhere classified: Secondary | ICD-10-CM | POA: Diagnosis not present

## 2021-02-21 DIAGNOSIS — Z96612 Presence of left artificial shoulder joint: Secondary | ICD-10-CM | POA: Diagnosis not present

## 2021-02-21 DIAGNOSIS — R278 Other lack of coordination: Secondary | ICD-10-CM | POA: Diagnosis not present

## 2021-02-21 DIAGNOSIS — M25512 Pain in left shoulder: Secondary | ICD-10-CM | POA: Diagnosis not present

## 2021-02-21 MED ORDER — HYDROCODONE BIT-HOMATROP MBR 5-1.5 MG/5ML PO SOLN: 5.0000 mL | Freq: Four times a day (QID) | ORAL | 0 refills | Status: DC | PRN

## 2021-02-21 NOTE — Telephone Encounter (Signed)
I believe we can send the prescription for nebulizer machine and meds to the DME company to fill out? Please check if it can be done  I apologize for the delay in cough med prescription. I have sent it in to [facility]

## 2021-02-21 NOTE — Telephone Encounter (Signed)
PM please advise on mychart message from pt.  Thanks

## 2021-02-22 DIAGNOSIS — C61 Malignant neoplasm of prostate: Secondary | ICD-10-CM | POA: Diagnosis not present

## 2021-02-22 DIAGNOSIS — H43813 Vitreous degeneration, bilateral: Secondary | ICD-10-CM | POA: Diagnosis not present

## 2021-02-22 DIAGNOSIS — H26492 Other secondary cataract, left eye: Secondary | ICD-10-CM | POA: Diagnosis not present

## 2021-02-22 DIAGNOSIS — H524 Presbyopia: Secondary | ICD-10-CM | POA: Diagnosis not present

## 2021-02-22 DIAGNOSIS — H0100A Unspecified blepharitis right eye, upper and lower eyelids: Secondary | ICD-10-CM | POA: Diagnosis not present

## 2021-02-23 ENCOUNTER — Encounter: Payer: Self-pay | Admitting: Pulmonary Disease

## 2021-02-23 NOTE — Telephone Encounter (Signed)
Community message sent to [employer] [facility] and Andee Poles) to check on status of nebulizer machine order. Patient is aware.

## 2021-02-25 DIAGNOSIS — I712 Thoracic aortic aneurysm, without rupture, unspecified: Secondary | ICD-10-CM | POA: Diagnosis not present

## 2021-02-25 DIAGNOSIS — M24812 Other specific joint derangements of left shoulder, not elsewhere classified: Secondary | ICD-10-CM | POA: Diagnosis not present

## 2021-02-25 DIAGNOSIS — M25512 Pain in left shoulder: Secondary | ICD-10-CM | POA: Diagnosis not present

## 2021-02-25 DIAGNOSIS — R911 Solitary pulmonary nodule: Secondary | ICD-10-CM | POA: Diagnosis not present

## 2021-02-25 DIAGNOSIS — M6388 Disorders of muscle in diseases classified elsewhere, other site: Secondary | ICD-10-CM | POA: Diagnosis not present

## 2021-02-25 DIAGNOSIS — I7121 Aneurysm of the ascending aorta, without rupture: Secondary | ICD-10-CM | POA: Diagnosis not present

## 2021-02-25 DIAGNOSIS — R600 Localized edema: Secondary | ICD-10-CM | POA: Diagnosis not present

## 2021-02-25 DIAGNOSIS — I700 Atherosclerosis of aorta: Secondary | ICD-10-CM | POA: Diagnosis not present

## 2021-02-25 DIAGNOSIS — M63812 Disorders of muscle in diseases classified elsewhere, left shoulder: Secondary | ICD-10-CM | POA: Diagnosis not present

## 2021-02-25 DIAGNOSIS — Z96612 Presence of left artificial shoulder joint: Secondary | ICD-10-CM | POA: Diagnosis not present

## 2021-02-25 DIAGNOSIS — R278 Other lack of coordination: Secondary | ICD-10-CM | POA: Diagnosis not present

## 2021-02-25 DIAGNOSIS — C61 Malignant neoplasm of prostate: Secondary | ICD-10-CM | POA: Diagnosis not present

## 2021-03-01 ENCOUNTER — Encounter: Payer: Self-pay | Admitting: Pulmonary Disease

## 2021-03-01 DIAGNOSIS — J3089 Other allergic rhinitis: Secondary | ICD-10-CM | POA: Diagnosis not present

## 2021-03-01 DIAGNOSIS — J4530 Mild persistent asthma, uncomplicated: Secondary | ICD-10-CM

## 2021-03-01 DIAGNOSIS — M24812 Other specific joint derangements of left shoulder, not elsewhere classified: Secondary | ICD-10-CM | POA: Diagnosis not present

## 2021-03-01 DIAGNOSIS — D381 Neoplasm of uncertain behavior of trachea, bronchus and lung: Secondary | ICD-10-CM | POA: Diagnosis not present

## 2021-03-01 DIAGNOSIS — R278 Other lack of coordination: Secondary | ICD-10-CM | POA: Diagnosis not present

## 2021-03-01 DIAGNOSIS — Z96612 Presence of left artificial shoulder joint: Secondary | ICD-10-CM | POA: Diagnosis not present

## 2021-03-01 DIAGNOSIS — M6388 Disorders of muscle in diseases classified elsewhere, other site: Secondary | ICD-10-CM | POA: Diagnosis not present

## 2021-03-01 DIAGNOSIS — R600 Localized edema: Secondary | ICD-10-CM | POA: Diagnosis not present

## 2021-03-01 DIAGNOSIS — M25512 Pain in left shoulder: Secondary | ICD-10-CM | POA: Diagnosis not present

## 2021-03-01 DIAGNOSIS — C61 Malignant neoplasm of prostate: Secondary | ICD-10-CM | POA: Diagnosis not present

## 2021-03-01 DIAGNOSIS — M63812 Disorders of muscle in diseases classified elsewhere, left shoulder: Secondary | ICD-10-CM | POA: Diagnosis not present

## 2021-03-02 MED ORDER — IPRATROPIUM-ALBUTEROL 0.5-2.5 (3) MG/3ML IN SOLN: 3.0000 mL | Freq: Four times a day (QID) | RESPIRATORY_TRACT | 11 refills | Status: DC | PRN

## 2021-03-02 NOTE — Telephone Encounter (Signed)
I had reviewed the CT scan which does show some lung nodules which appear benign.  There is no evidence of pneumonia.  Agree that you will need another follow-up CT in 6 months.  Has this been ordered by your urologist?  I apologize with regard to your nebulizer machine.  We did send in the order to [employer] [facility].  I will have my nurse check into it and send the order again.

## 2021-03-03 ENCOUNTER — Telehealth: Payer: Self-pay | Admitting: Pulmonary Disease

## 2021-03-04 DIAGNOSIS — Z96612 Presence of left artificial shoulder joint: Secondary | ICD-10-CM | POA: Diagnosis not present

## 2021-03-04 DIAGNOSIS — M24812 Other specific joint derangements of left shoulder, not elsewhere classified: Secondary | ICD-10-CM | POA: Diagnosis not present

## 2021-03-04 DIAGNOSIS — M6388 Disorders of muscle in diseases classified elsewhere, other site: Secondary | ICD-10-CM | POA: Diagnosis not present

## 2021-03-04 DIAGNOSIS — M63812 Disorders of muscle in diseases classified elsewhere, left shoulder: Secondary | ICD-10-CM | POA: Diagnosis not present

## 2021-03-04 DIAGNOSIS — R600 Localized edema: Secondary | ICD-10-CM | POA: Diagnosis not present

## 2021-03-04 DIAGNOSIS — M25512 Pain in left shoulder: Secondary | ICD-10-CM | POA: Diagnosis not present

## 2021-03-04 DIAGNOSIS — J3089 Other allergic rhinitis: Secondary | ICD-10-CM | POA: Diagnosis not present

## 2021-03-04 DIAGNOSIS — R278 Other lack of coordination: Secondary | ICD-10-CM | POA: Diagnosis not present

## 2021-03-04 NOTE — Telephone Encounter (Signed)
I will fax the office notes to Otila Kluver at the pharmacy. Nothing further needed. I let tina know that I am faxing the note from the 12/01/122 visit. Nothing further needed.

## 2021-03-04 NOTE — Telephone Encounter (Signed)
FYI for PM>  thanks

## 2021-03-07 DIAGNOSIS — J3089 Other allergic rhinitis: Secondary | ICD-10-CM | POA: Diagnosis not present

## 2021-03-07 DIAGNOSIS — J301 Allergic rhinitis due to pollen: Secondary | ICD-10-CM | POA: Diagnosis not present

## 2021-03-07 NOTE — Telephone Encounter (Signed)
Received the following message from patient:   "Just to let you know that the nebulizer arrived over the weekend. I also got an email today from [employer]. in [place], [place] that I will receive a package from them on Wednesday. May I assume that these are the meds that go with the nebulizer? Also, a question re: the meds. Is there any worry that the treatments could cause dysphonia?  Many thanks for your work on this.  I had no idea it would be so complicated. Brandon Simmons"  He wanted to know if the Duoneb solution could call dysphonia.   Dr. Vaughan Browner, can you please advise? Thanks!

## 2021-03-08 DIAGNOSIS — R600 Localized edema: Secondary | ICD-10-CM | POA: Diagnosis not present

## 2021-03-08 DIAGNOSIS — M6388 Disorders of muscle in diseases classified elsewhere, other site: Secondary | ICD-10-CM | POA: Diagnosis not present

## 2021-03-08 DIAGNOSIS — R278 Other lack of coordination: Secondary | ICD-10-CM | POA: Diagnosis not present

## 2021-03-08 DIAGNOSIS — M24812 Other specific joint derangements of left shoulder, not elsewhere classified: Secondary | ICD-10-CM | POA: Diagnosis not present

## 2021-03-08 DIAGNOSIS — M25512 Pain in left shoulder: Secondary | ICD-10-CM | POA: Diagnosis not present

## 2021-03-08 DIAGNOSIS — Z96612 Presence of left artificial shoulder joint: Secondary | ICD-10-CM | POA: Diagnosis not present

## 2021-03-08 DIAGNOSIS — M63812 Disorders of muscle in diseases classified elsewhere, left shoulder: Secondary | ICD-10-CM | POA: Diagnosis not present

## 2021-03-09 NOTE — Telephone Encounter (Signed)
Dysphonia is a rare side effect and not common. It does not have any steroid in it so has lesser side effects. If this is a concern over christmas then perhaps you can hold off starting the medications until after the holidays.

## 2021-03-10 DIAGNOSIS — M24812 Other specific joint derangements of left shoulder, not elsewhere classified: Secondary | ICD-10-CM | POA: Diagnosis not present

## 2021-03-10 DIAGNOSIS — R278 Other lack of coordination: Secondary | ICD-10-CM | POA: Diagnosis not present

## 2021-03-10 DIAGNOSIS — R600 Localized edema: Secondary | ICD-10-CM | POA: Diagnosis not present

## 2021-03-10 DIAGNOSIS — Z96612 Presence of left artificial shoulder joint: Secondary | ICD-10-CM | POA: Diagnosis not present

## 2021-03-10 DIAGNOSIS — M6388 Disorders of muscle in diseases classified elsewhere, other site: Secondary | ICD-10-CM | POA: Diagnosis not present

## 2021-03-10 DIAGNOSIS — M25512 Pain in left shoulder: Secondary | ICD-10-CM | POA: Diagnosis not present

## 2021-03-10 DIAGNOSIS — M63812 Disorders of muscle in diseases classified elsewhere, left shoulder: Secondary | ICD-10-CM | POA: Diagnosis not present

## 2021-03-11 DIAGNOSIS — J301 Allergic rhinitis due to pollen: Secondary | ICD-10-CM | POA: Diagnosis not present

## 2021-03-11 DIAGNOSIS — J3089 Other allergic rhinitis: Secondary | ICD-10-CM | POA: Diagnosis not present

## 2021-03-15 DIAGNOSIS — M63812 Disorders of muscle in diseases classified elsewhere, left shoulder: Secondary | ICD-10-CM | POA: Diagnosis not present

## 2021-03-15 DIAGNOSIS — M25512 Pain in left shoulder: Secondary | ICD-10-CM | POA: Diagnosis not present

## 2021-03-15 DIAGNOSIS — R600 Localized edema: Secondary | ICD-10-CM | POA: Diagnosis not present

## 2021-03-15 DIAGNOSIS — R278 Other lack of coordination: Secondary | ICD-10-CM | POA: Diagnosis not present

## 2021-03-15 DIAGNOSIS — M24812 Other specific joint derangements of left shoulder, not elsewhere classified: Secondary | ICD-10-CM | POA: Diagnosis not present

## 2021-03-15 DIAGNOSIS — M6388 Disorders of muscle in diseases classified elsewhere, other site: Secondary | ICD-10-CM | POA: Diagnosis not present

## 2021-03-15 DIAGNOSIS — Z96612 Presence of left artificial shoulder joint: Secondary | ICD-10-CM | POA: Diagnosis not present

## 2021-03-16 DIAGNOSIS — J3089 Other allergic rhinitis: Secondary | ICD-10-CM | POA: Diagnosis not present

## 2021-03-16 DIAGNOSIS — J301 Allergic rhinitis due to pollen: Secondary | ICD-10-CM | POA: Diagnosis not present

## 2021-03-25 DIAGNOSIS — Z96612 Presence of left artificial shoulder joint: Secondary | ICD-10-CM | POA: Diagnosis not present

## 2021-03-25 DIAGNOSIS — M25512 Pain in left shoulder: Secondary | ICD-10-CM | POA: Diagnosis not present

## 2021-03-25 DIAGNOSIS — M63812 Disorders of muscle in diseases classified elsewhere, left shoulder: Secondary | ICD-10-CM | POA: Diagnosis not present

## 2021-03-25 DIAGNOSIS — R278 Other lack of coordination: Secondary | ICD-10-CM | POA: Diagnosis not present

## 2021-03-25 DIAGNOSIS — R600 Localized edema: Secondary | ICD-10-CM | POA: Diagnosis not present

## 2021-03-25 DIAGNOSIS — M6388 Disorders of muscle in diseases classified elsewhere, other site: Secondary | ICD-10-CM | POA: Diagnosis not present

## 2021-03-25 DIAGNOSIS — M24812 Other specific joint derangements of left shoulder, not elsewhere classified: Secondary | ICD-10-CM | POA: Diagnosis not present

## 2021-03-28 DIAGNOSIS — R600 Localized edema: Secondary | ICD-10-CM | POA: Diagnosis not present

## 2021-03-28 DIAGNOSIS — M63812 Disorders of muscle in diseases classified elsewhere, left shoulder: Secondary | ICD-10-CM | POA: Diagnosis not present

## 2021-03-28 DIAGNOSIS — Z96612 Presence of left artificial shoulder joint: Secondary | ICD-10-CM | POA: Diagnosis not present

## 2021-03-28 DIAGNOSIS — M24812 Other specific joint derangements of left shoulder, not elsewhere classified: Secondary | ICD-10-CM | POA: Diagnosis not present

## 2021-03-28 DIAGNOSIS — M25512 Pain in left shoulder: Secondary | ICD-10-CM | POA: Diagnosis not present

## 2021-03-28 DIAGNOSIS — R278 Other lack of coordination: Secondary | ICD-10-CM | POA: Diagnosis not present

## 2021-03-28 DIAGNOSIS — M6388 Disorders of muscle in diseases classified elsewhere, other site: Secondary | ICD-10-CM | POA: Diagnosis not present

## 2021-03-29 DIAGNOSIS — J3089 Other allergic rhinitis: Secondary | ICD-10-CM | POA: Diagnosis not present

## 2021-03-31 DIAGNOSIS — M6388 Disorders of muscle in diseases classified elsewhere, other site: Secondary | ICD-10-CM | POA: Diagnosis not present

## 2021-03-31 DIAGNOSIS — M63812 Disorders of muscle in diseases classified elsewhere, left shoulder: Secondary | ICD-10-CM | POA: Diagnosis not present

## 2021-03-31 DIAGNOSIS — R278 Other lack of coordination: Secondary | ICD-10-CM | POA: Diagnosis not present

## 2021-03-31 DIAGNOSIS — M24812 Other specific joint derangements of left shoulder, not elsewhere classified: Secondary | ICD-10-CM | POA: Diagnosis not present

## 2021-03-31 DIAGNOSIS — R600 Localized edema: Secondary | ICD-10-CM | POA: Diagnosis not present

## 2021-03-31 DIAGNOSIS — Z96612 Presence of left artificial shoulder joint: Secondary | ICD-10-CM | POA: Diagnosis not present

## 2021-03-31 DIAGNOSIS — M25512 Pain in left shoulder: Secondary | ICD-10-CM | POA: Diagnosis not present

## 2021-04-04 DIAGNOSIS — M63812 Disorders of muscle in diseases classified elsewhere, left shoulder: Secondary | ICD-10-CM | POA: Diagnosis not present

## 2021-04-04 DIAGNOSIS — R278 Other lack of coordination: Secondary | ICD-10-CM | POA: Diagnosis not present

## 2021-04-04 DIAGNOSIS — Z96612 Presence of left artificial shoulder joint: Secondary | ICD-10-CM | POA: Diagnosis not present

## 2021-04-04 DIAGNOSIS — R600 Localized edema: Secondary | ICD-10-CM | POA: Diagnosis not present

## 2021-04-04 DIAGNOSIS — M6388 Disorders of muscle in diseases classified elsewhere, other site: Secondary | ICD-10-CM | POA: Diagnosis not present

## 2021-04-04 DIAGNOSIS — M25512 Pain in left shoulder: Secondary | ICD-10-CM | POA: Diagnosis not present

## 2021-04-04 DIAGNOSIS — M24812 Other specific joint derangements of left shoulder, not elsewhere classified: Secondary | ICD-10-CM | POA: Diagnosis not present

## 2021-04-07 DIAGNOSIS — M6388 Disorders of muscle in diseases classified elsewhere, other site: Secondary | ICD-10-CM | POA: Diagnosis not present

## 2021-04-07 DIAGNOSIS — R278 Other lack of coordination: Secondary | ICD-10-CM | POA: Diagnosis not present

## 2021-04-07 DIAGNOSIS — M25512 Pain in left shoulder: Secondary | ICD-10-CM | POA: Diagnosis not present

## 2021-04-07 DIAGNOSIS — M24812 Other specific joint derangements of left shoulder, not elsewhere classified: Secondary | ICD-10-CM | POA: Diagnosis not present

## 2021-04-07 DIAGNOSIS — Z96612 Presence of left artificial shoulder joint: Secondary | ICD-10-CM | POA: Diagnosis not present

## 2021-04-07 DIAGNOSIS — M63812 Disorders of muscle in diseases classified elsewhere, left shoulder: Secondary | ICD-10-CM | POA: Diagnosis not present

## 2021-04-07 DIAGNOSIS — R600 Localized edema: Secondary | ICD-10-CM | POA: Diagnosis not present

## 2021-04-11 DIAGNOSIS — J301 Allergic rhinitis due to pollen: Secondary | ICD-10-CM | POA: Diagnosis not present

## 2021-04-11 DIAGNOSIS — J4530 Mild persistent asthma, uncomplicated: Secondary | ICD-10-CM | POA: Diagnosis not present

## 2021-04-11 DIAGNOSIS — J3089 Other allergic rhinitis: Secondary | ICD-10-CM | POA: Diagnosis not present

## 2021-04-12 DIAGNOSIS — M25512 Pain in left shoulder: Secondary | ICD-10-CM | POA: Diagnosis not present

## 2021-04-12 DIAGNOSIS — M24812 Other specific joint derangements of left shoulder, not elsewhere classified: Secondary | ICD-10-CM | POA: Diagnosis not present

## 2021-04-12 DIAGNOSIS — Z96612 Presence of left artificial shoulder joint: Secondary | ICD-10-CM | POA: Diagnosis not present

## 2021-04-12 DIAGNOSIS — R600 Localized edema: Secondary | ICD-10-CM | POA: Diagnosis not present

## 2021-04-12 DIAGNOSIS — M63812 Disorders of muscle in diseases classified elsewhere, left shoulder: Secondary | ICD-10-CM | POA: Diagnosis not present

## 2021-04-12 DIAGNOSIS — M6388 Disorders of muscle in diseases classified elsewhere, other site: Secondary | ICD-10-CM | POA: Diagnosis not present

## 2021-04-12 DIAGNOSIS — R278 Other lack of coordination: Secondary | ICD-10-CM | POA: Diagnosis not present

## 2021-04-14 DIAGNOSIS — L821 Other seborrheic keratosis: Secondary | ICD-10-CM | POA: Diagnosis not present

## 2021-04-14 DIAGNOSIS — Z96612 Presence of left artificial shoulder joint: Secondary | ICD-10-CM | POA: Diagnosis not present

## 2021-04-14 DIAGNOSIS — M24812 Other specific joint derangements of left shoulder, not elsewhere classified: Secondary | ICD-10-CM | POA: Diagnosis not present

## 2021-04-14 DIAGNOSIS — L820 Inflamed seborrheic keratosis: Secondary | ICD-10-CM | POA: Diagnosis not present

## 2021-04-14 DIAGNOSIS — R600 Localized edema: Secondary | ICD-10-CM | POA: Diagnosis not present

## 2021-04-14 DIAGNOSIS — B078 Other viral warts: Secondary | ICD-10-CM | POA: Diagnosis not present

## 2021-04-14 DIAGNOSIS — R278 Other lack of coordination: Secondary | ICD-10-CM | POA: Diagnosis not present

## 2021-04-14 DIAGNOSIS — D044 Carcinoma in situ of skin of scalp and neck: Secondary | ICD-10-CM | POA: Diagnosis not present

## 2021-04-14 DIAGNOSIS — M6388 Disorders of muscle in diseases classified elsewhere, other site: Secondary | ICD-10-CM | POA: Diagnosis not present

## 2021-04-14 DIAGNOSIS — M25512 Pain in left shoulder: Secondary | ICD-10-CM | POA: Diagnosis not present

## 2021-04-14 DIAGNOSIS — M63812 Disorders of muscle in diseases classified elsewhere, left shoulder: Secondary | ICD-10-CM | POA: Diagnosis not present

## 2021-04-18 ENCOUNTER — Ambulatory Visit (INDEPENDENT_AMBULATORY_CARE_PROVIDER_SITE_OTHER): Payer: Medicare Other | Admitting: Pulmonary Disease

## 2021-04-18 ENCOUNTER — Encounter: Payer: Self-pay | Admitting: Pulmonary Disease

## 2021-04-18 ENCOUNTER — Other Ambulatory Visit: Payer: Self-pay

## 2021-04-18 VITALS — BP 126/78 | HR 67 | Temp 98.2°F | Ht 70.0 in | Wt 220.0 lb

## 2021-04-18 DIAGNOSIS — J4530 Mild persistent asthma, uncomplicated: Secondary | ICD-10-CM

## 2021-04-18 DIAGNOSIS — R051 Acute cough: Secondary | ICD-10-CM

## 2021-04-18 NOTE — Progress Notes (Signed)
Brandon Simmons    [id number]    [birth year]  Primary Care Physician:Paterson, Ermalene Searing, MD  Referring Physician: [name], MD [address] [place],  [place] [address]   Chief complaint: Follow-up for pneumonia, mild asthma  HPI: 76 year old with history of arrhythmias, asthma, prostate cancer, DVT Developed community-acquired pneumonia in mid April.  Symptoms started off with a upper respiratory viral infection and proceeded to cough, fevers of 102.  He was evaluated in the ED on 4/22 and given amoxicillin with no improvement.  He was reevaluated on 4/24 and doxycycline added.  He got a round of prednisone as well.  He was given breztri from his primary care doctor but had to stop after few days as he developed sore throat and hoarseness.  He then got levofloxacin and prednisone for 7 days.  Unfortunately developed atrial fibrillation transiently while on prednisone but converted back to sinus rhythm.  Overall improved with this treatment  History notable for mild asthma for which he sees Dr. Remus Blake at allergy center.  Treated with Asmanex inhaler, albuterol. 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  No feather pillows or comforters Smoking history: Minimal smoking history in his 34s Travel history: No significant travel history Relevant family history: No family history of lung disease  Interim history: He has done better over the year however developed COVID-19 in October 2022 after a trip to [place].  Treated with monotherapy with but has persistent cough since then.    He was seen in pulmonary clinic for persistent symptoms and given Z-Pak, nebulizers Continued on Asmanex, Singulair He did not want prednisone as he had side effects from it in the past  Overall is  improved since then and feels back to normal.  Outpatient Encounter Medications as of 04/18/2021  Medication Sig   acetaminophen (TYLENOL) 500 MG tablet Take 500 mg by mouth 2 (two) times daily as needed.   apixaban (ELIQUIS) 5 MG TABS tablet Take 5 mg by mouth 2 (two) times daily.    atorvastatin (LIPITOR) 20 MG tablet Take 20 mg by mouth daily.   diltiazem (CARDIZEM CD) 180 MG 24 hr capsule TAKE 1 CAPSULE (180 MG TOTAL) BY MOUTH DAILY IN THE AFTERNOON.   GLUCOSAMINE-CHONDROITIN PO Take 1,500 mg by mouth daily.   Mometasone Furoate (ASMANEX HFA) 100 MCG/ACT AERO Inhale 1 puff into the lungs every evening.   montelukast (SINGULAIR) 10 MG tablet Take 10 mg by mouth at bedtime.   NON FORMULARY Allergy Shots every 3 weeks   omeprazole (PRILOSEC) 20 MG capsule Take 20 mg by mouth 2 (two) times daily before a meal.    predniSONE (DELTASONE) 10 MG tablet 10 mg. 10- 20mg  as needed   Probiotic Product (ALIGN) 4 MG CAPS Take 4 mg by mouth every evening.   psyllium (METAMUCIL) 58.6 % powder Take 1 packet by mouth 2 (two) times daily.   VENTOLIN HFA 108 (90 Base) MCG/ACT inhaler Inhale 1-2 puffs into the lungs every 6 (six) hours as needed for wheezing or shortness of breath.   EPINEPHrine 0.3 mg/0.3 mL IJ SOAJ injection Inject 0.3 mg into the muscle as needed for anaphylaxis. (Patient not taking: Reported on 04/18/2021)   flecainide (TAMBOCOR) 100 MG tablet Take 1  tablet (100 mg total) by mouth 2 (two) times daily. May take extra tablet for breakthrough afib   Naphazoline-Pheniramine (OPCON-A) 0.027-0.315 % SOLN Place 1 drop into both eyes 2 (two) times daily as needed (eye allergies (red/irritated eyes)). (Patient not taking: Reported on 02/17/2021)   zolpidem (AMBIEN) 10 MG tablet Take 10 mg by mouth at bedtime as needed. (Patient not taking: Reported on 04/18/2021)   [DISCONTINUED] chlorpheniramine (CHLOR-TRIMETON) 4 MG tablet Take 4 mg by mouth daily.   [DISCONTINUED] HYDROcodone bit-homatropine  (HYCODAN) 5-1.5 MG/5ML syrup Take 5 mLs by mouth every 6 (six) hours as needed for cough.   [DISCONTINUED] HYDROcodone-acetaminophen (NORCO/VICODIN) 5-325 MG tablet Take 1 tablet by mouth 3 (three) times daily as needed.   [DISCONTINUED] HYDROCODONE-HOMATROPINE PO Take by mouth. 2-4 ml every other night   [DISCONTINUED] ipratropium (ATROVENT) 0.02 % nebulizer solution TAKE 2.5 MLS BY NEBULIZATION 3 TIMES DAILY AS NEEDED FOR WHEEZING OR SHORTNESS OF BREATH.   [DISCONTINUED] ipratropium-albuterol (DUONEB) 0.5-2.5 (3) MG/3ML SOLN Take 3 mLs by nebulization every 6 (six) hours as needed.   No facility-administered encounter medications on file as of 04/18/2021.    Physical Exam: Blood pressure 126/78, pulse 67, temperature 98.2 F (36.8 C), temperature source Oral, height 5\' 10"  (1.778 m), weight 220 lb (99.8 kg), SpO2 98 %. Gen:      No acute distress HEENT:  EOMI, sclera anicteric Neck:     No masses; no thyromegaly Lungs:    Clear to auscultation bilaterally; normal respiratory effort CV:         Regular rate and rhythm; no murmurs Abd:      + bowel sounds; soft, non-tender; no palpable masses, no distension Ext:    No edema; adequate peripheral perfusion Skin:      Warm and dry; no rash Neuro: alert and oriented x 3 Psych: normal mood and affect   Data Reviewed: Imaging: Chest x-ray 07/09/2020- minimal basal atelectasis Chest x-ray 07/11/2020- patchy bibasilar opacities left greater than right Chest x-ray report from primary care 08/24/2020-slight improvement in bilateral infiltrates PET scan 10/13/2020-pulmonary nodule in the left lower lobe with limited uptake.  Architectural distortion in the left lower lobe. Chest x-ray 02/17/2021-persistent left lower lobe opacity CT chest 02/25/2021- 4 mm nodule at the left lung base.  New small nodules in the inferior lingula, ascending thoracic aortic aneurysm. I have reviewed the images personally.  PFTs:   Labs:  Assessment:  Post  COVID-pneumonia Had COVID infection in October 2022 with persistent cough since then.  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  Symptoms improved with Z-Pak, nebulizer and duo nebs 3 times a day  Lung nodule Chest x-ray ordered by urology shows benign-appearing nodule at the lung base.  Follow-up scan already ordered by urology  Asthma Continues on steroid inhaler, albuterol as needed.   He may benefit from Symbicort inhaler but he wants to avoid as it had caused hoarseness of voice in the past and he would like to continue singing in the church choir  As his symptoms have improved he would like to continue treatment with his primary care and will follow-up with pulmonary as needed  Plan/Recommendations: Continue Asmanex, Singulair Follow-up as needed  Marshell Garfinkel MD [facility] 04/18/2021, 2:47 PM  CC: [name], MD

## 2021-04-18 NOTE — Patient Instructions (Signed)
Glad you are feeling better with improvement in your cough Continue the steroid inhaler albuterol as needed Follow-up in pulmonary clinic as needed

## 2021-04-19 DIAGNOSIS — Z96612 Presence of left artificial shoulder joint: Secondary | ICD-10-CM | POA: Diagnosis not present

## 2021-04-19 DIAGNOSIS — M63812 Disorders of muscle in diseases classified elsewhere, left shoulder: Secondary | ICD-10-CM | POA: Diagnosis not present

## 2021-04-19 DIAGNOSIS — M25512 Pain in left shoulder: Secondary | ICD-10-CM | POA: Diagnosis not present

## 2021-04-19 DIAGNOSIS — M24812 Other specific joint derangements of left shoulder, not elsewhere classified: Secondary | ICD-10-CM | POA: Diagnosis not present

## 2021-04-19 DIAGNOSIS — R278 Other lack of coordination: Secondary | ICD-10-CM | POA: Diagnosis not present

## 2021-04-19 DIAGNOSIS — M6388 Disorders of muscle in diseases classified elsewhere, other site: Secondary | ICD-10-CM | POA: Diagnosis not present

## 2021-04-19 DIAGNOSIS — R600 Localized edema: Secondary | ICD-10-CM | POA: Diagnosis not present

## 2021-04-20 DIAGNOSIS — Z96612 Presence of left artificial shoulder joint: Secondary | ICD-10-CM | POA: Diagnosis not present

## 2021-04-21 DIAGNOSIS — M6388 Disorders of muscle in diseases classified elsewhere, other site: Secondary | ICD-10-CM | POA: Diagnosis not present

## 2021-04-21 DIAGNOSIS — R278 Other lack of coordination: Secondary | ICD-10-CM | POA: Diagnosis not present

## 2021-04-21 DIAGNOSIS — M25512 Pain in left shoulder: Secondary | ICD-10-CM | POA: Diagnosis not present

## 2021-04-21 DIAGNOSIS — M63812 Disorders of muscle in diseases classified elsewhere, left shoulder: Secondary | ICD-10-CM | POA: Diagnosis not present

## 2021-04-21 DIAGNOSIS — R600 Localized edema: Secondary | ICD-10-CM | POA: Diagnosis not present

## 2021-04-21 DIAGNOSIS — M24812 Other specific joint derangements of left shoulder, not elsewhere classified: Secondary | ICD-10-CM | POA: Diagnosis not present

## 2021-04-21 DIAGNOSIS — Z96612 Presence of left artificial shoulder joint: Secondary | ICD-10-CM | POA: Diagnosis not present

## 2021-04-28 ENCOUNTER — Encounter: Payer: Self-pay | Admitting: Internal Medicine

## 2021-05-10 DIAGNOSIS — J3089 Other allergic rhinitis: Secondary | ICD-10-CM | POA: Diagnosis not present

## 2021-05-10 DIAGNOSIS — J3081 Allergic rhinitis due to animal (cat) (dog) hair and dander: Secondary | ICD-10-CM | POA: Diagnosis not present

## 2021-05-10 DIAGNOSIS — J301 Allergic rhinitis due to pollen: Secondary | ICD-10-CM | POA: Diagnosis not present

## 2021-05-11 DIAGNOSIS — R278 Other lack of coordination: Secondary | ICD-10-CM | POA: Diagnosis not present

## 2021-05-11 DIAGNOSIS — M24812 Other specific joint derangements of left shoulder, not elsewhere classified: Secondary | ICD-10-CM | POA: Diagnosis not present

## 2021-05-11 DIAGNOSIS — M63812 Disorders of muscle in diseases classified elsewhere, left shoulder: Secondary | ICD-10-CM | POA: Diagnosis not present

## 2021-05-11 DIAGNOSIS — M25512 Pain in left shoulder: Secondary | ICD-10-CM | POA: Diagnosis not present

## 2021-05-11 DIAGNOSIS — Z96612 Presence of left artificial shoulder joint: Secondary | ICD-10-CM | POA: Diagnosis not present

## 2021-05-11 DIAGNOSIS — R600 Localized edema: Secondary | ICD-10-CM | POA: Diagnosis not present

## 2021-05-11 DIAGNOSIS — M6388 Disorders of muscle in diseases classified elsewhere, other site: Secondary | ICD-10-CM | POA: Diagnosis not present

## 2021-05-13 DIAGNOSIS — R278 Other lack of coordination: Secondary | ICD-10-CM | POA: Diagnosis not present

## 2021-05-13 DIAGNOSIS — R600 Localized edema: Secondary | ICD-10-CM | POA: Diagnosis not present

## 2021-05-13 DIAGNOSIS — M6388 Disorders of muscle in diseases classified elsewhere, other site: Secondary | ICD-10-CM | POA: Diagnosis not present

## 2021-05-13 DIAGNOSIS — Z96612 Presence of left artificial shoulder joint: Secondary | ICD-10-CM | POA: Diagnosis not present

## 2021-05-13 DIAGNOSIS — M24812 Other specific joint derangements of left shoulder, not elsewhere classified: Secondary | ICD-10-CM | POA: Diagnosis not present

## 2021-05-13 DIAGNOSIS — M25512 Pain in left shoulder: Secondary | ICD-10-CM | POA: Diagnosis not present

## 2021-05-13 DIAGNOSIS — M63812 Disorders of muscle in diseases classified elsewhere, left shoulder: Secondary | ICD-10-CM | POA: Diagnosis not present

## 2021-05-16 DIAGNOSIS — R278 Other lack of coordination: Secondary | ICD-10-CM | POA: Diagnosis not present

## 2021-05-16 DIAGNOSIS — M24812 Other specific joint derangements of left shoulder, not elsewhere classified: Secondary | ICD-10-CM | POA: Diagnosis not present

## 2021-05-16 DIAGNOSIS — Z96612 Presence of left artificial shoulder joint: Secondary | ICD-10-CM | POA: Diagnosis not present

## 2021-05-16 DIAGNOSIS — M63812 Disorders of muscle in diseases classified elsewhere, left shoulder: Secondary | ICD-10-CM | POA: Diagnosis not present

## 2021-05-16 DIAGNOSIS — M6388 Disorders of muscle in diseases classified elsewhere, other site: Secondary | ICD-10-CM | POA: Diagnosis not present

## 2021-05-16 DIAGNOSIS — R600 Localized edema: Secondary | ICD-10-CM | POA: Diagnosis not present

## 2021-05-16 DIAGNOSIS — Z20822 Contact with and (suspected) exposure to covid-19: Secondary | ICD-10-CM | POA: Diagnosis not present

## 2021-05-16 DIAGNOSIS — M25512 Pain in left shoulder: Secondary | ICD-10-CM | POA: Diagnosis not present

## 2021-05-19 DIAGNOSIS — R278 Other lack of coordination: Secondary | ICD-10-CM | POA: Diagnosis not present

## 2021-05-19 DIAGNOSIS — M25512 Pain in left shoulder: Secondary | ICD-10-CM | POA: Diagnosis not present

## 2021-05-19 DIAGNOSIS — M24812 Other specific joint derangements of left shoulder, not elsewhere classified: Secondary | ICD-10-CM | POA: Diagnosis not present

## 2021-05-19 DIAGNOSIS — R600 Localized edema: Secondary | ICD-10-CM | POA: Diagnosis not present

## 2021-05-19 DIAGNOSIS — M6388 Disorders of muscle in diseases classified elsewhere, other site: Secondary | ICD-10-CM | POA: Diagnosis not present

## 2021-05-19 DIAGNOSIS — M63812 Disorders of muscle in diseases classified elsewhere, left shoulder: Secondary | ICD-10-CM | POA: Diagnosis not present

## 2021-05-19 DIAGNOSIS — Z96612 Presence of left artificial shoulder joint: Secondary | ICD-10-CM | POA: Diagnosis not present

## 2021-05-20 ENCOUNTER — Other Ambulatory Visit (HOSPITAL_COMMUNITY): Payer: Self-pay | Admitting: Nurse Practitioner

## 2021-05-20 DIAGNOSIS — I480 Paroxysmal atrial fibrillation: Secondary | ICD-10-CM

## 2021-05-23 DIAGNOSIS — M24812 Other specific joint derangements of left shoulder, not elsewhere classified: Secondary | ICD-10-CM | POA: Diagnosis not present

## 2021-05-23 DIAGNOSIS — M6388 Disorders of muscle in diseases classified elsewhere, other site: Secondary | ICD-10-CM | POA: Diagnosis not present

## 2021-05-23 DIAGNOSIS — Z96612 Presence of left artificial shoulder joint: Secondary | ICD-10-CM | POA: Diagnosis not present

## 2021-05-23 DIAGNOSIS — M25512 Pain in left shoulder: Secondary | ICD-10-CM | POA: Diagnosis not present

## 2021-05-23 DIAGNOSIS — M63812 Disorders of muscle in diseases classified elsewhere, left shoulder: Secondary | ICD-10-CM | POA: Diagnosis not present

## 2021-05-23 DIAGNOSIS — R600 Localized edema: Secondary | ICD-10-CM | POA: Diagnosis not present

## 2021-05-23 DIAGNOSIS — R278 Other lack of coordination: Secondary | ICD-10-CM | POA: Diagnosis not present

## 2021-05-25 DIAGNOSIS — Z20828 Contact with and (suspected) exposure to other viral communicable diseases: Secondary | ICD-10-CM | POA: Diagnosis not present

## 2021-05-31 DIAGNOSIS — C61 Malignant neoplasm of prostate: Secondary | ICD-10-CM | POA: Diagnosis not present

## 2021-05-31 DIAGNOSIS — J301 Allergic rhinitis due to pollen: Secondary | ICD-10-CM | POA: Diagnosis not present

## 2021-05-31 DIAGNOSIS — J3089 Other allergic rhinitis: Secondary | ICD-10-CM | POA: Diagnosis not present

## 2021-06-01 DIAGNOSIS — R278 Other lack of coordination: Secondary | ICD-10-CM | POA: Diagnosis not present

## 2021-06-01 DIAGNOSIS — R600 Localized edema: Secondary | ICD-10-CM | POA: Diagnosis not present

## 2021-06-01 DIAGNOSIS — M24812 Other specific joint derangements of left shoulder, not elsewhere classified: Secondary | ICD-10-CM | POA: Diagnosis not present

## 2021-06-01 DIAGNOSIS — M25512 Pain in left shoulder: Secondary | ICD-10-CM | POA: Diagnosis not present

## 2021-06-01 DIAGNOSIS — Z20822 Contact with and (suspected) exposure to covid-19: Secondary | ICD-10-CM | POA: Diagnosis not present

## 2021-06-01 DIAGNOSIS — M6388 Disorders of muscle in diseases classified elsewhere, other site: Secondary | ICD-10-CM | POA: Diagnosis not present

## 2021-06-01 DIAGNOSIS — Z96612 Presence of left artificial shoulder joint: Secondary | ICD-10-CM | POA: Diagnosis not present

## 2021-06-01 DIAGNOSIS — M63812 Disorders of muscle in diseases classified elsewhere, left shoulder: Secondary | ICD-10-CM | POA: Diagnosis not present

## 2021-06-03 ENCOUNTER — Other Ambulatory Visit (HOSPITAL_COMMUNITY): Payer: Self-pay | Admitting: Nurse Practitioner

## 2021-06-03 DIAGNOSIS — M6388 Disorders of muscle in diseases classified elsewhere, other site: Secondary | ICD-10-CM | POA: Diagnosis not present

## 2021-06-03 DIAGNOSIS — R278 Other lack of coordination: Secondary | ICD-10-CM | POA: Diagnosis not present

## 2021-06-03 DIAGNOSIS — Z96612 Presence of left artificial shoulder joint: Secondary | ICD-10-CM | POA: Diagnosis not present

## 2021-06-03 DIAGNOSIS — M24812 Other specific joint derangements of left shoulder, not elsewhere classified: Secondary | ICD-10-CM | POA: Diagnosis not present

## 2021-06-03 DIAGNOSIS — R600 Localized edema: Secondary | ICD-10-CM | POA: Diagnosis not present

## 2021-06-03 DIAGNOSIS — M25512 Pain in left shoulder: Secondary | ICD-10-CM | POA: Diagnosis not present

## 2021-06-03 DIAGNOSIS — I480 Paroxysmal atrial fibrillation: Secondary | ICD-10-CM

## 2021-06-03 DIAGNOSIS — M63812 Disorders of muscle in diseases classified elsewhere, left shoulder: Secondary | ICD-10-CM | POA: Diagnosis not present

## 2021-06-05 DIAGNOSIS — U071 COVID-19: Secondary | ICD-10-CM | POA: Diagnosis not present

## 2021-06-07 DIAGNOSIS — M6388 Disorders of muscle in diseases classified elsewhere, other site: Secondary | ICD-10-CM | POA: Diagnosis not present

## 2021-06-07 DIAGNOSIS — Z96612 Presence of left artificial shoulder joint: Secondary | ICD-10-CM | POA: Diagnosis not present

## 2021-06-07 DIAGNOSIS — R600 Localized edema: Secondary | ICD-10-CM | POA: Diagnosis not present

## 2021-06-07 DIAGNOSIS — R278 Other lack of coordination: Secondary | ICD-10-CM | POA: Diagnosis not present

## 2021-06-07 DIAGNOSIS — M24812 Other specific joint derangements of left shoulder, not elsewhere classified: Secondary | ICD-10-CM | POA: Diagnosis not present

## 2021-06-07 DIAGNOSIS — L738 Other specified follicular disorders: Secondary | ICD-10-CM | POA: Diagnosis not present

## 2021-06-07 DIAGNOSIS — M25512 Pain in left shoulder: Secondary | ICD-10-CM | POA: Diagnosis not present

## 2021-06-07 DIAGNOSIS — M63812 Disorders of muscle in diseases classified elsewhere, left shoulder: Secondary | ICD-10-CM | POA: Diagnosis not present

## 2021-06-13 ENCOUNTER — Ambulatory Visit: Payer: Medicare Other | Admitting: Internal Medicine

## 2021-06-13 DIAGNOSIS — R600 Localized edema: Secondary | ICD-10-CM | POA: Diagnosis not present

## 2021-06-13 DIAGNOSIS — M6388 Disorders of muscle in diseases classified elsewhere, other site: Secondary | ICD-10-CM | POA: Diagnosis not present

## 2021-06-13 DIAGNOSIS — M63812 Disorders of muscle in diseases classified elsewhere, left shoulder: Secondary | ICD-10-CM | POA: Diagnosis not present

## 2021-06-13 DIAGNOSIS — M24812 Other specific joint derangements of left shoulder, not elsewhere classified: Secondary | ICD-10-CM | POA: Diagnosis not present

## 2021-06-13 DIAGNOSIS — M25512 Pain in left shoulder: Secondary | ICD-10-CM | POA: Diagnosis not present

## 2021-06-13 DIAGNOSIS — R278 Other lack of coordination: Secondary | ICD-10-CM | POA: Diagnosis not present

## 2021-06-13 DIAGNOSIS — Z96612 Presence of left artificial shoulder joint: Secondary | ICD-10-CM | POA: Diagnosis not present

## 2021-06-14 DIAGNOSIS — J3089 Other allergic rhinitis: Secondary | ICD-10-CM | POA: Diagnosis not present

## 2021-06-14 DIAGNOSIS — J301 Allergic rhinitis due to pollen: Secondary | ICD-10-CM | POA: Diagnosis not present

## 2021-06-15 DIAGNOSIS — R278 Other lack of coordination: Secondary | ICD-10-CM | POA: Diagnosis not present

## 2021-06-15 DIAGNOSIS — M6388 Disorders of muscle in diseases classified elsewhere, other site: Secondary | ICD-10-CM | POA: Diagnosis not present

## 2021-06-15 DIAGNOSIS — M24812 Other specific joint derangements of left shoulder, not elsewhere classified: Secondary | ICD-10-CM | POA: Diagnosis not present

## 2021-06-15 DIAGNOSIS — M25512 Pain in left shoulder: Secondary | ICD-10-CM | POA: Diagnosis not present

## 2021-06-15 DIAGNOSIS — R600 Localized edema: Secondary | ICD-10-CM | POA: Diagnosis not present

## 2021-06-15 DIAGNOSIS — M63812 Disorders of muscle in diseases classified elsewhere, left shoulder: Secondary | ICD-10-CM | POA: Diagnosis not present

## 2021-06-15 DIAGNOSIS — Z96612 Presence of left artificial shoulder joint: Secondary | ICD-10-CM | POA: Diagnosis not present

## 2021-06-23 ENCOUNTER — Encounter: Payer: Self-pay | Admitting: Cardiology

## 2021-06-23 ENCOUNTER — Ambulatory Visit (INDEPENDENT_AMBULATORY_CARE_PROVIDER_SITE_OTHER): Payer: Medicare Other | Admitting: Cardiology

## 2021-06-23 VITALS — BP 130/68 | HR 56 | Ht 70.0 in | Wt 218.0 lb

## 2021-06-23 DIAGNOSIS — D6869 Other thrombophilia: Secondary | ICD-10-CM

## 2021-06-23 DIAGNOSIS — Z79899 Other long term (current) drug therapy: Secondary | ICD-10-CM | POA: Diagnosis not present

## 2021-06-23 DIAGNOSIS — I480 Paroxysmal atrial fibrillation: Secondary | ICD-10-CM

## 2021-06-23 DIAGNOSIS — Z20822 Contact with and (suspected) exposure to covid-19: Secondary | ICD-10-CM | POA: Diagnosis not present

## 2021-06-23 MED ORDER — FLECAINIDE ACETATE 100 MG PO TABS: 150.0000 mg | ORAL_TABLET | Freq: Two times a day (BID) | ORAL | 3 refills | Status: DC

## 2021-06-23 NOTE — Patient Instructions (Addendum)
Medication Instructions:  ?Your physician has recommended you make the following change in your medication:  ?INCREASE Flecainide to 150 mg twice daily ---- YOU WILL START THIS INCREASED DOSE 7 DAYS PRIOR TO YOUR TREADMILL TESTING ? ?*If you need a refill on your cardiac medications before your next appointment, please call your pharmacy* ? ? ?Lab Work: ?None ordered ? ? ?Testing/Procedures: ?Your physician has requested that you have an exercise tolerance test. For further information please visit [email or web address]. Please also follow instruction sheet, as given. ? ? ?Follow-Up: ?At [facility], you and your health needs are our priority.  As part of our continuing mission to provide you with exceptional heart care, we have created designated Provider Care Teams.  These Care Teams include your primary Cardiologist (physician) and Advanced Practice Providers (APPs -  Physician Assistants and Nurse Practitioners) who all work together to provide you with the care you need, when you need it. ? ?Your next appointment:   ?3 month(s) ? ?The format for your next appointment:   ?In Person ? ?Provider:   ?Allegra Lai, MD ? ? ? ?Thank you for choosing [facility]!! ? ? ?Brandon Curet, RN ?[phone] ? ? ?Other Instructions ? ? ?Dear Brandon Simmons,  ? ?You are scheduled for an Exercise Stress Test on ___________________ at _______________ ? ?Please arrive 15 minutes prior to your appointment time for registration and insurance purposes. ? ?The test will take approximately 45 minutes to complete. ? ?How to prepare for your Exercise Stress Test: ?Do bring a list of your current medications with you.  If not listed below, you may take your medications as normal. ?Do wear comfortable clothes (no dresses or overalls) and walking shoes, tennis shoes preferred (no heels or open toed shoes are allowed) ?Do Not wear cologne, perfume, aftershave or lotions (deodorant is allowed). ?Please report to [place], [address] for your test. ? ?If these instructions are not followed, your test will have to be rescheduled. ? ?If you have questions or concerns about your appointment, you can call the Stress Lab at [phone]. ? ?If you cannot keep your appointment, please provide 24 hours notification to the Stress Lab, to avoid a possible $50 charge to your account. ? ? ? ? ? ?Exercise Stress Test ?An exercise stress test is a test that is done to collect information about how your heart functions during exercise. The test is done while you are walking on a treadmill or using a stationary bike. The goal is to raise your heart rate and "stress" the heart. The heart is evaluated before, during, and after you exercise. An electrocardiogram (ECG) will be used to monitor the heart, and your blood pressure will also be monitored. In some cases, nuclear scanning or an ultrasound of the heart (echocardiogram) will also be done to evaluate your heart. ?An exercise stress test is done to look for coronary artery disease (CAD). The test may also be done to: ?Evaluate your limits of exercise during cardiac rehabilitation. ?Check for high blood pressure during exercise. ?Check how well you can exercise after such treatments as coronary stenting or new medicines. ?Check for problems with blood flow to your arms and legs during exercise. ?If you have an abnormal test result, this may mean that you are not getting enough blood flow to your heart during exercise. More testing may be needed to understand why your test was not normal. ?Tell a health care provider about: ?Any allergies you have. ?All medicines you are  taking, including vitamins, herbs, eye drops, creams, and over-the-counter medicines. ?Any blood disorders you have. ?Any surgeries you have had. ?Any medical conditions you have. ?Whether you are pregnant or may be pregnant. ?What are the risks? ?Generally, this is a safe procedure. However, problems may occur, including: ?Pain or  pressure in the following areas: ?Chest. ?Jaw or neck. ?Between your shoulder blades. ?Down your left arm. ?Dizziness or lightheadedness. ?Shortness of breath. ?Increased or irregular heartbeats. ?Nausea or vomiting. ?Heart attack (rare). ?Life-threatening abnormal heart rhythm (rare). ?What happens before the procedure? ?Follow instructions from your health care provider about eating or drinking restrictions. ?You may be told to avoid all forms of caffeine for 24 hours before the test. This includes coffee, tea (even decaffeinated tea), caffeinated sodas, chocolate, cocoa, and certain pain medicines. ?Ask your health care provider about: ?Taking over-the-counter medicines, vitamins, herbs, and supplements. ?Changing or stopping your regular medicines. This is especially important if you are taking diabetes medicines or beta-blocker medicines. ?If you have diabetes, ask how you are to take your insulin or pills. It is common to adjust your insulin dose the morning of the test. ?If you are taking beta-blocker medicines, it is important to talk to your health care provider about these medicines well before the date of your test. Taking beta-blocker medicines may interfere with the test. In some cases, these medicines may need to be changed or stopped 24 hours or more before the test. ?If you wear a nitroglycerin patch, it may need to be removed prior to the test. Ask your health care provider if the patch should be removed before the test. ?If you use an inhaler for any breathing condition, bring it with you to the test. ?Do not apply lotions, powders, creams, or oils on your chest prior to the test. ?Wear loose-fitting clothes and comfortable walking shoes. ?Do not use any products that contain nicotine or tobacco, such as cigarettes and e-cigarettes, for 4 hours before the test or as told by your health care provider. If you need help quitting, ask your health care provider. ?What happens during the  procedure? ? ?Multiple electrodes will be attached to your chest. ?Multiple wires will be attached to the electrodes. These will transfer the electrical impulses from your heart to the ECG machine. Your heart will be monitored both at rest and while exercising. ?If you are also having an echocardiogram or nuclear scanning, images of your heart will be taken before and after you exercise. ?A blood pressure cuff will be placed around your arm to measure your blood pressure throughout the test. You will feel it tighten and loosen throughout the test. ?You will walk on a treadmill or use a stationary bike. If you cannot use these, you may be asked to turn a crank with your hands. ?You will start at a slow pace or level on the exercise machine. The exercise difficulty will be slowly increased to raise your heart rate. In the case of a treadmill, the speed and incline will gradually be increased. ?You may be asked to periodically breathe into a tube. This measures the gases you breathe out. ?You will be asked how you are feeling throughout the test. You will be asked to rate your level of exertion. ?Tell the staff right away if you feel: ?Chest pain. ?Dizziness. ?Shortness of breath. ?Too fatigued to continue. ?Pain or aching in your legs or arms. ?You will exercise until you have symptoms or until you reach a target heart rate. The test  will also be stopped if you have changes in your blood pressure or ECG readings, or if you develop an irregular heartbeat (arrhythmia). ?The procedure may vary among health care providers and hospitals. ?What happens after the procedure? ?You will sit down and recover from the exercise. Your blood pressure, heart rate, and ECG will be monitored until you recover. ?You may return to your normal schedule, including diet, activities, and medicines, unless your health care provider tells you otherwise. ?It is up to you to get your test results. Ask your health care provider, or the department  that is doing the test, when your results will be ready. ?Summary ?An exercise stress test is a test that is done to collect information about how your heart functions during exercise. ?This test is done to look for coron

## 2021-06-23 NOTE — Progress Notes (Signed)
? ?Electrophysiology Office Note ? ? ?Date:  06/23/2021  ? ?ID:  Brandon Alt., DOB 1946-01-21, MRN [id number] ? ?PCP:  [name], MD  ?Cardiologist:  [place] ?Primary Electrophysiologist:  [name], MD   ? ?Chief Complaint: AF ?  ?History of Present Illness: ?Brandon Simmons. is a 76 y.o. male who is being seen today for the evaluation of AF at the request of [name], MD. Presenting today for electrophysiology evaluation. ? ?He has a history significant for atrial fibrillation, hyperlipidemia, ectatic thoracic aorta.  He is status post atrial fibrillation ablation 03/28/2018 with repeat ablation 11/11/2019.  He is unfortunately continued to have atrial fibrillation despite 2 ablations.  He is currently on flecainide 100 mg twice daily, diltiazem 180 mg daily. ? ?Today, he denies symptoms of palpitations, chest pain, shortness of breath, orthopnea, PND, lower extremity edema, claudication, dizziness, presyncope, syncope, bleeding, or neurologic sequela. The patient is tolerating medications without difficulties.  Fortunately has continued to have episodes of atrial fibrillation.  They occur quite frequently.  He feels weak, fatigued, short of breath when he is in atrial fibrillation. ? ? ?Past Medical History:  ?Diagnosis Date  ? Aortic atherosclerosis ([place])   ? Arthritis   ? Asthma   ? Diverticulitis   ? Diverticulosis   ? DVT (deep venous thrombosis) ([place]) 2019  ? right leg after back surgery  ? Dysrhythmia   ? A-fib every few weeks  ? Ectatic thoracic aorta ([place])   ? a. 4.3cm by CT 03/2018.  ? GERD (gastroesophageal reflux disease)   ? Hemorrhoids   ? History of echocardiogram   ? a. Echo 10/16 [facility]  EF 55%, trace MR, mild TR, mild to mod AI, mild dilated Ao root (41 mm)  ? Hyperlipemia   ? Organic erectile dysfunction   ? PAF (paroxysmal atrial fibrillation) ([place])   ? a. admx to [facility] in [place], [place] 10/16 with AF with RVR >> converted to NSR with IV  Dilt;  b. Flecainide started >> ETT neg for pro-arrhythmia   ? Pneumonia 06/2020  ? Prostate cancer ([place]) 04/25/2006  ? Gleason 3+4=7  ? Prostatitis   ? S/P cardiac cath   ? a. LHC at [facility] 10/16:  LM ok, mLAD 30%, LCx ok, dRCA 20%  ? S/P radiation therapy 01/19/2014 through 03/05/2014                                                     ? Prostate bed 6600 cGy in 33 sessions                          ? Torn rotator cuff   ? right  40% tear  ? ?Past Surgical History:  ?Procedure Laterality Date  ? ABLATION OF DYSRHYTHMIC FOCUS  03/28/2018  ? APPENDECTOMY  1967  ? ATRIAL FIBRILLATION ABLATION N/A 03/28/2018  ? Procedure: ATRIAL FIBRILLATION ABLATION;  Surgeon: [name], MD;  Location: [place] [facility];  Service: Cardiovascular;  Laterality: N/A;  ? ATRIAL FIBRILLATION ABLATION N/A 11/11/2019  ? Procedure: ATRIAL FIBRILLATION ABLATION;  Surgeon: [name], MD;  Location: [place] [facility];  Service: Cardiovascular;  Laterality: N/A;  ? BACK SURGERY  2019  ? CARDIAC CATHETERIZATION  2016  ?  [place]  ? CATARACT EXTRACTION W/ INTRAOCULAR LENS IMPLANT Left 11/2011  ? CATARACT EXTRACTION W/ INTRAOCULAR LENS IMPLANT Right 09/2015  ? COLONOSCOPY    ? LARYNX SURGERY  2000  ? vocal cord lesion- benign  ? LUMBAR LAMINECTOMY/DECOMPRESSION MICRODISCECTOMY Right 07/26/2017  ? Procedure: RIGHT LUMBAR 3- LUMBAR 4 FORAMINOTOMY WITH RESECTION OF SYNOVIAL CYST;  Surgeon: Erline Levine, MD;  Location: [facility];  Service: Neurosurgery;  Laterality: Right;  RIGHT LUMBAR 3- LUMBAR 4 FORAMINOTOMY WITH RESECTION OF SYNOVIAL CYST  ? MENISCUS REPAIR Right   ? POLYPECTOMY    ? PROSTATE BIOPSY  1999  ? PROSTATECTOMY  04/25/2006  ? REVERSE SHOULDER ARTHROPLASTY Left 09/02/2020  ? Procedure: REVERSE SHOULDER ARTHROPLASTY;  Surgeon: [name], MD;  Location: [facility];  Service: Orthopedics;  Laterality: Left;  148mn  ? ? ? ?Current Outpatient Medications  ?Medication Sig Dispense Refill  ? acetaminophen (TYLENOL) 500 MG tablet Take 500  mg by mouth 2 (two) times daily as needed.    ? apixaban (ELIQUIS) 5 MG TABS tablet Take 5 mg by mouth 2 (two) times daily.     ? atorvastatin (LIPITOR) 20 MG tablet Take 20 mg by mouth daily.    ? diltiazem (CARDIZEM CD) 180 MG 24 hr capsule TAKE 1 CAPSULE (180 MG TOTAL) BY MOUTH DAILY IN THE AFTERNOON. 90 capsule 3  ? diltiazem (CARDIZEM) 30 MG tablet TAKE 1 TABLET EVERY 4 HOURS AS NEEDED FOR AFIB RAPID HEART RATE OVER 100 540 tablet 1  ? EPINEPHrine 0.3 mg/0.3 mL IJ SOAJ injection Inject 0.3 mg into the muscle as needed for anaphylaxis.    ? [START ON 07/18/2021] flecainide (TAMBOCOR) 100 MG tablet Take 1.5 tablets (150 mg total) by mouth 2 (two) times daily. 90 tablet 3  ? GLUCOSAMINE-CHONDROITIN PO Take 1,500 mg by mouth daily.    ? Mometasone Furoate (ASMANEX HFA) 100 MCG/ACT AERO Inhale 1 puff into the lungs every evening.    ? montelukast (SINGULAIR) 10 MG tablet Take 10 mg by mouth at bedtime.    ? Naphazoline-Pheniramine (OPCON-A) 0.027-0.315 % SOLN Place 1 drop into both eyes 2 (two) times daily as needed (eye allergies (red/irritated eyes)).    ? NON FORMULARY Allergy Shots every 3 weeks    ? omeprazole (PRILOSEC) 20 MG capsule Take 20 mg by mouth 2 (two) times daily before a meal.     ? predniSONE (DELTASONE) 10 MG tablet 10 mg. 10- '20mg'$  as needed    ? Probiotic Product (ALIGN) 4 MG CAPS Take 4 mg by mouth every evening.    ? psyllium (METAMUCIL) 58.6 % powder Take 1 packet by mouth 2 (two) times daily.    ? VENTOLIN HFA 108 (90 Base) MCG/ACT inhaler Inhale 1-2 puffs into the lungs every 6 (six) hours as needed for wheezing or shortness of breath.    ? zolpidem (AMBIEN) 10 MG tablet Take 10 mg by mouth at bedtime as needed.    ? ?No current facility-administered medications for this visit.  ? ? ?Allergies:   Patient has no known allergies.  ? ?Social History:  The patient  reports that he quit smoking about 51 years ago. His smoking use included cigarettes. He has a 10.00 pack-year smoking history. He has  never used smokeless tobacco. He reports that he does not currently use alcohol. He reports that he does not use drugs.  ? ?Family History:  The patient's family history includes Arthritis in his mother; Atrial fibrillation in his son; Diabetes in his  maternal grandmother; Emphysema in his father; Heart disease in his maternal grandfather, mother, and paternal grandfather; Hypertension in his mother; Liver cancer in his paternal grandmother; Lung cancer in his father.  ? ? ?ROS:  Please see the history of present illness.   Otherwise, review of systems is positive for none.   All other systems are reviewed and negative.  ? ? ?PHYSICAL EXAM: ?VS:  BP 130/68   Pulse (!) 56   Ht '5\' 10"'$  (1.778 m)   Wt 218 lb (98.9 kg)   SpO2 97%   BMI 31.28 kg/m?  , BMI Body mass index is 31.28 kg/m?. ?GEN: Well nourished, well developed, in no acute distress  ?HEENT: normal  ?Neck: no JVD, carotid bruits, or masses ?Cardiac: RRR; no murmurs, rubs, or gallops,no edema  ?Respiratory:  clear to auscultation bilaterally, normal work of breathing ?GI: soft, nontender, nondistended, + BS ?MS: no deformity or atrophy  ?Skin: warm and dry ?Neuro:  Strength and sensation are intact ?Psych: euthymic mood, full affect ? ?EKG:  EKG is ordered today. ?Personal review of the ekg ordered shows sinus rhythm3 ? ?Recent Labs: ?07/09/2020: ALT 23 ?08/24/2020: BUN 19; Creatinine, Ser 1.16; Hemoglobin 13.9; Platelets 188; Potassium 3.7; Sodium 140  ? ? ?Lipid Panel  ?No results found for: CHOL, TRIG, HDL, CHOLHDL, VLDL, LDLCALC, LDLDIRECT ? ? ?Wt Readings from Last 3 Encounters:  ?06/23/21 218 lb (98.9 kg)  ?04/18/21 220 lb (99.8 kg)  ?02/17/21 216 lb 12.8 oz (98.3 kg)  ?  ? ? ?Other studies Reviewed: ?Additional studies/ records that were reviewed today include: TTE 08/24/18  ?Review of the above records today demonstrates:  ? 1. The left ventricle has normal systolic function with an ejection  ?fraction of 60-65%. The cavity size was normal. Left  ventricular diastolic  ?Doppler parameters are consistent with pseudonormalization.  ? 2. The right ventricle has normal systolic function. The cavity was  ?normal. There is no increase in right ventricular wall th

## 2021-06-27 DIAGNOSIS — J3089 Other allergic rhinitis: Secondary | ICD-10-CM | POA: Diagnosis not present

## 2021-06-27 DIAGNOSIS — J301 Allergic rhinitis due to pollen: Secondary | ICD-10-CM | POA: Diagnosis not present

## 2021-07-04 DIAGNOSIS — Z20822 Contact with and (suspected) exposure to covid-19: Secondary | ICD-10-CM | POA: Diagnosis not present

## 2021-07-11 ENCOUNTER — Encounter: Payer: Self-pay | Admitting: Cardiology

## 2021-07-11 DIAGNOSIS — H43811 Vitreous degeneration, right eye: Secondary | ICD-10-CM | POA: Diagnosis not present

## 2021-07-11 DIAGNOSIS — H31001 Unspecified chorioretinal scars, right eye: Secondary | ICD-10-CM | POA: Diagnosis not present

## 2021-07-12 DIAGNOSIS — J301 Allergic rhinitis due to pollen: Secondary | ICD-10-CM | POA: Diagnosis not present

## 2021-07-12 DIAGNOSIS — M24812 Other specific joint derangements of left shoulder, not elsewhere classified: Secondary | ICD-10-CM | POA: Diagnosis not present

## 2021-07-12 DIAGNOSIS — M6388 Disorders of muscle in diseases classified elsewhere, other site: Secondary | ICD-10-CM | POA: Diagnosis not present

## 2021-07-12 DIAGNOSIS — R278 Other lack of coordination: Secondary | ICD-10-CM | POA: Diagnosis not present

## 2021-07-12 DIAGNOSIS — M25512 Pain in left shoulder: Secondary | ICD-10-CM | POA: Diagnosis not present

## 2021-07-12 DIAGNOSIS — R600 Localized edema: Secondary | ICD-10-CM | POA: Diagnosis not present

## 2021-07-12 DIAGNOSIS — J3089 Other allergic rhinitis: Secondary | ICD-10-CM | POA: Diagnosis not present

## 2021-07-12 DIAGNOSIS — E785 Hyperlipidemia, unspecified: Secondary | ICD-10-CM | POA: Diagnosis not present

## 2021-07-12 DIAGNOSIS — J3081 Allergic rhinitis due to animal (cat) (dog) hair and dander: Secondary | ICD-10-CM | POA: Diagnosis not present

## 2021-07-12 DIAGNOSIS — Z96612 Presence of left artificial shoulder joint: Secondary | ICD-10-CM | POA: Diagnosis not present

## 2021-07-12 DIAGNOSIS — I2510 Atherosclerotic heart disease of native coronary artery without angina pectoris: Secondary | ICD-10-CM | POA: Diagnosis not present

## 2021-07-12 DIAGNOSIS — Z125 Encounter for screening for malignant neoplasm of prostate: Secondary | ICD-10-CM | POA: Diagnosis not present

## 2021-07-12 DIAGNOSIS — M63812 Disorders of muscle in diseases classified elsewhere, left shoulder: Secondary | ICD-10-CM | POA: Diagnosis not present

## 2021-07-14 DIAGNOSIS — Z96612 Presence of left artificial shoulder joint: Secondary | ICD-10-CM | POA: Diagnosis not present

## 2021-07-14 DIAGNOSIS — R278 Other lack of coordination: Secondary | ICD-10-CM | POA: Diagnosis not present

## 2021-07-14 DIAGNOSIS — M25512 Pain in left shoulder: Secondary | ICD-10-CM | POA: Diagnosis not present

## 2021-07-14 DIAGNOSIS — M24812 Other specific joint derangements of left shoulder, not elsewhere classified: Secondary | ICD-10-CM | POA: Diagnosis not present

## 2021-07-14 DIAGNOSIS — M6388 Disorders of muscle in diseases classified elsewhere, other site: Secondary | ICD-10-CM | POA: Diagnosis not present

## 2021-07-14 DIAGNOSIS — M63812 Disorders of muscle in diseases classified elsewhere, left shoulder: Secondary | ICD-10-CM | POA: Diagnosis not present

## 2021-07-14 DIAGNOSIS — R600 Localized edema: Secondary | ICD-10-CM | POA: Diagnosis not present

## 2021-07-18 DIAGNOSIS — R600 Localized edema: Secondary | ICD-10-CM | POA: Diagnosis not present

## 2021-07-18 DIAGNOSIS — M24812 Other specific joint derangements of left shoulder, not elsewhere classified: Secondary | ICD-10-CM | POA: Diagnosis not present

## 2021-07-18 DIAGNOSIS — M25512 Pain in left shoulder: Secondary | ICD-10-CM | POA: Diagnosis not present

## 2021-07-18 DIAGNOSIS — R278 Other lack of coordination: Secondary | ICD-10-CM | POA: Diagnosis not present

## 2021-07-18 DIAGNOSIS — M6388 Disorders of muscle in diseases classified elsewhere, other site: Secondary | ICD-10-CM | POA: Diagnosis not present

## 2021-07-18 DIAGNOSIS — Z96612 Presence of left artificial shoulder joint: Secondary | ICD-10-CM | POA: Diagnosis not present

## 2021-07-18 DIAGNOSIS — M63812 Disorders of muscle in diseases classified elsewhere, left shoulder: Secondary | ICD-10-CM | POA: Diagnosis not present

## 2021-07-19 DIAGNOSIS — Z7901 Long term (current) use of anticoagulants: Secondary | ICD-10-CM | POA: Diagnosis not present

## 2021-07-19 DIAGNOSIS — I2510 Atherosclerotic heart disease of native coronary artery without angina pectoris: Secondary | ICD-10-CM | POA: Diagnosis not present

## 2021-07-19 DIAGNOSIS — C61 Malignant neoplasm of prostate: Secondary | ICD-10-CM | POA: Diagnosis not present

## 2021-07-19 DIAGNOSIS — Z0000 Encounter for general adult medical examination without abnormal findings: Secondary | ICD-10-CM | POA: Diagnosis not present

## 2021-07-19 DIAGNOSIS — Z1339 Encounter for screening examination for other mental health and behavioral disorders: Secondary | ICD-10-CM | POA: Diagnosis not present

## 2021-07-19 DIAGNOSIS — E785 Hyperlipidemia, unspecified: Secondary | ICD-10-CM | POA: Diagnosis not present

## 2021-07-19 DIAGNOSIS — I4891 Unspecified atrial fibrillation: Secondary | ICD-10-CM | POA: Diagnosis not present

## 2021-07-19 DIAGNOSIS — Z1331 Encounter for screening for depression: Secondary | ICD-10-CM | POA: Diagnosis not present

## 2021-07-19 DIAGNOSIS — R82998 Other abnormal findings in urine: Secondary | ICD-10-CM | POA: Diagnosis not present

## 2021-07-21 ENCOUNTER — Ambulatory Visit (INDEPENDENT_AMBULATORY_CARE_PROVIDER_SITE_OTHER): Payer: Medicare Other

## 2021-07-21 DIAGNOSIS — Z79899 Other long term (current) drug therapy: Secondary | ICD-10-CM | POA: Diagnosis not present

## 2021-07-21 DIAGNOSIS — I480 Paroxysmal atrial fibrillation: Secondary | ICD-10-CM | POA: Diagnosis not present

## 2021-07-21 DIAGNOSIS — M25512 Pain in left shoulder: Secondary | ICD-10-CM | POA: Diagnosis not present

## 2021-07-21 DIAGNOSIS — R600 Localized edema: Secondary | ICD-10-CM | POA: Diagnosis not present

## 2021-07-21 DIAGNOSIS — M24812 Other specific joint derangements of left shoulder, not elsewhere classified: Secondary | ICD-10-CM | POA: Diagnosis not present

## 2021-07-21 DIAGNOSIS — Z96612 Presence of left artificial shoulder joint: Secondary | ICD-10-CM | POA: Diagnosis not present

## 2021-07-21 DIAGNOSIS — R278 Other lack of coordination: Secondary | ICD-10-CM | POA: Diagnosis not present

## 2021-07-21 DIAGNOSIS — M63812 Disorders of muscle in diseases classified elsewhere, left shoulder: Secondary | ICD-10-CM | POA: Diagnosis not present

## 2021-07-21 DIAGNOSIS — M6388 Disorders of muscle in diseases classified elsewhere, other site: Secondary | ICD-10-CM | POA: Diagnosis not present

## 2021-07-21 LAB — EXERCISE TOLERANCE TEST
Angina Index: 0
Duke Treadmill Score: 7
Estimated workload: 8.4
Exercise duration (min): 6 min
Exercise duration (sec): 56 s
MPHR: 144 {beats}/min
Peak HR: 100 {beats}/min
Percent HR: 69 %
RPE: 17
Rest HR: 58 {beats}/min
ST Depression (mm): 0 mm

## 2021-07-25 DIAGNOSIS — H43811 Vitreous degeneration, right eye: Secondary | ICD-10-CM | POA: Diagnosis not present

## 2021-07-25 DIAGNOSIS — H31003 Unspecified chorioretinal scars, bilateral: Secondary | ICD-10-CM | POA: Diagnosis not present

## 2021-07-25 DIAGNOSIS — Z20822 Contact with and (suspected) exposure to covid-19: Secondary | ICD-10-CM | POA: Diagnosis not present

## 2021-07-26 DIAGNOSIS — M63812 Disorders of muscle in diseases classified elsewhere, left shoulder: Secondary | ICD-10-CM | POA: Diagnosis not present

## 2021-07-26 DIAGNOSIS — R600 Localized edema: Secondary | ICD-10-CM | POA: Diagnosis not present

## 2021-07-26 DIAGNOSIS — R278 Other lack of coordination: Secondary | ICD-10-CM | POA: Diagnosis not present

## 2021-07-26 DIAGNOSIS — M24812 Other specific joint derangements of left shoulder, not elsewhere classified: Secondary | ICD-10-CM | POA: Diagnosis not present

## 2021-07-26 DIAGNOSIS — Z96612 Presence of left artificial shoulder joint: Secondary | ICD-10-CM | POA: Diagnosis not present

## 2021-07-26 DIAGNOSIS — M25512 Pain in left shoulder: Secondary | ICD-10-CM | POA: Diagnosis not present

## 2021-07-26 DIAGNOSIS — M6388 Disorders of muscle in diseases classified elsewhere, other site: Secondary | ICD-10-CM | POA: Diagnosis not present

## 2021-08-01 DIAGNOSIS — M25512 Pain in left shoulder: Secondary | ICD-10-CM | POA: Diagnosis not present

## 2021-08-01 DIAGNOSIS — Z96612 Presence of left artificial shoulder joint: Secondary | ICD-10-CM | POA: Diagnosis not present

## 2021-08-01 DIAGNOSIS — M63812 Disorders of muscle in diseases classified elsewhere, left shoulder: Secondary | ICD-10-CM | POA: Diagnosis not present

## 2021-08-01 DIAGNOSIS — R600 Localized edema: Secondary | ICD-10-CM | POA: Diagnosis not present

## 2021-08-01 DIAGNOSIS — M24812 Other specific joint derangements of left shoulder, not elsewhere classified: Secondary | ICD-10-CM | POA: Diagnosis not present

## 2021-08-01 DIAGNOSIS — R278 Other lack of coordination: Secondary | ICD-10-CM | POA: Diagnosis not present

## 2021-08-01 DIAGNOSIS — M6388 Disorders of muscle in diseases classified elsewhere, other site: Secondary | ICD-10-CM | POA: Diagnosis not present

## 2021-08-02 DIAGNOSIS — J301 Allergic rhinitis due to pollen: Secondary | ICD-10-CM | POA: Diagnosis not present

## 2021-08-02 DIAGNOSIS — J3089 Other allergic rhinitis: Secondary | ICD-10-CM | POA: Diagnosis not present

## 2021-08-04 DIAGNOSIS — R278 Other lack of coordination: Secondary | ICD-10-CM | POA: Diagnosis not present

## 2021-08-04 DIAGNOSIS — M6388 Disorders of muscle in diseases classified elsewhere, other site: Secondary | ICD-10-CM | POA: Diagnosis not present

## 2021-08-04 DIAGNOSIS — M24812 Other specific joint derangements of left shoulder, not elsewhere classified: Secondary | ICD-10-CM | POA: Diagnosis not present

## 2021-08-04 DIAGNOSIS — R600 Localized edema: Secondary | ICD-10-CM | POA: Diagnosis not present

## 2021-08-04 DIAGNOSIS — Z96612 Presence of left artificial shoulder joint: Secondary | ICD-10-CM | POA: Diagnosis not present

## 2021-08-04 DIAGNOSIS — M63812 Disorders of muscle in diseases classified elsewhere, left shoulder: Secondary | ICD-10-CM | POA: Diagnosis not present

## 2021-08-04 DIAGNOSIS — M25512 Pain in left shoulder: Secondary | ICD-10-CM | POA: Diagnosis not present

## 2021-08-08 DIAGNOSIS — Z96612 Presence of left artificial shoulder joint: Secondary | ICD-10-CM | POA: Diagnosis not present

## 2021-08-08 DIAGNOSIS — R600 Localized edema: Secondary | ICD-10-CM | POA: Diagnosis not present

## 2021-08-08 DIAGNOSIS — R278 Other lack of coordination: Secondary | ICD-10-CM | POA: Diagnosis not present

## 2021-08-08 DIAGNOSIS — M24812 Other specific joint derangements of left shoulder, not elsewhere classified: Secondary | ICD-10-CM | POA: Diagnosis not present

## 2021-08-08 DIAGNOSIS — M63812 Disorders of muscle in diseases classified elsewhere, left shoulder: Secondary | ICD-10-CM | POA: Diagnosis not present

## 2021-08-08 DIAGNOSIS — M6388 Disorders of muscle in diseases classified elsewhere, other site: Secondary | ICD-10-CM | POA: Diagnosis not present

## 2021-08-08 DIAGNOSIS — M25512 Pain in left shoulder: Secondary | ICD-10-CM | POA: Diagnosis not present

## 2021-08-16 DIAGNOSIS — R600 Localized edema: Secondary | ICD-10-CM | POA: Diagnosis not present

## 2021-08-16 DIAGNOSIS — M63812 Disorders of muscle in diseases classified elsewhere, left shoulder: Secondary | ICD-10-CM | POA: Diagnosis not present

## 2021-08-16 DIAGNOSIS — R278 Other lack of coordination: Secondary | ICD-10-CM | POA: Diagnosis not present

## 2021-08-16 DIAGNOSIS — M25512 Pain in left shoulder: Secondary | ICD-10-CM | POA: Diagnosis not present

## 2021-08-16 DIAGNOSIS — M6388 Disorders of muscle in diseases classified elsewhere, other site: Secondary | ICD-10-CM | POA: Diagnosis not present

## 2021-08-16 DIAGNOSIS — Z96612 Presence of left artificial shoulder joint: Secondary | ICD-10-CM | POA: Diagnosis not present

## 2021-08-16 DIAGNOSIS — M24812 Other specific joint derangements of left shoulder, not elsewhere classified: Secondary | ICD-10-CM | POA: Diagnosis not present

## 2021-08-17 DIAGNOSIS — J4530 Mild persistent asthma, uncomplicated: Secondary | ICD-10-CM | POA: Diagnosis not present

## 2021-08-17 DIAGNOSIS — J3089 Other allergic rhinitis: Secondary | ICD-10-CM | POA: Diagnosis not present

## 2021-08-17 DIAGNOSIS — J301 Allergic rhinitis due to pollen: Secondary | ICD-10-CM | POA: Diagnosis not present

## 2021-08-19 DIAGNOSIS — M25512 Pain in left shoulder: Secondary | ICD-10-CM | POA: Diagnosis not present

## 2021-08-19 DIAGNOSIS — R278 Other lack of coordination: Secondary | ICD-10-CM | POA: Diagnosis not present

## 2021-08-19 DIAGNOSIS — M63812 Disorders of muscle in diseases classified elsewhere, left shoulder: Secondary | ICD-10-CM | POA: Diagnosis not present

## 2021-08-19 DIAGNOSIS — M24812 Other specific joint derangements of left shoulder, not elsewhere classified: Secondary | ICD-10-CM | POA: Diagnosis not present

## 2021-08-19 DIAGNOSIS — R600 Localized edema: Secondary | ICD-10-CM | POA: Diagnosis not present

## 2021-08-19 DIAGNOSIS — M6388 Disorders of muscle in diseases classified elsewhere, other site: Secondary | ICD-10-CM | POA: Diagnosis not present

## 2021-08-19 DIAGNOSIS — Z96612 Presence of left artificial shoulder joint: Secondary | ICD-10-CM | POA: Diagnosis not present

## 2021-08-22 DIAGNOSIS — R600 Localized edema: Secondary | ICD-10-CM | POA: Diagnosis not present

## 2021-08-22 DIAGNOSIS — M24812 Other specific joint derangements of left shoulder, not elsewhere classified: Secondary | ICD-10-CM | POA: Diagnosis not present

## 2021-08-22 DIAGNOSIS — Z96612 Presence of left artificial shoulder joint: Secondary | ICD-10-CM | POA: Diagnosis not present

## 2021-08-22 DIAGNOSIS — M6388 Disorders of muscle in diseases classified elsewhere, other site: Secondary | ICD-10-CM | POA: Diagnosis not present

## 2021-08-22 DIAGNOSIS — M25512 Pain in left shoulder: Secondary | ICD-10-CM | POA: Diagnosis not present

## 2021-08-22 DIAGNOSIS — R278 Other lack of coordination: Secondary | ICD-10-CM | POA: Diagnosis not present

## 2021-08-22 DIAGNOSIS — M63812 Disorders of muscle in diseases classified elsewhere, left shoulder: Secondary | ICD-10-CM | POA: Diagnosis not present

## 2021-08-24 DIAGNOSIS — J3089 Other allergic rhinitis: Secondary | ICD-10-CM | POA: Diagnosis not present

## 2021-08-24 DIAGNOSIS — J3081 Allergic rhinitis due to animal (cat) (dog) hair and dander: Secondary | ICD-10-CM | POA: Diagnosis not present

## 2021-08-24 DIAGNOSIS — J301 Allergic rhinitis due to pollen: Secondary | ICD-10-CM | POA: Diagnosis not present

## 2021-08-24 DIAGNOSIS — C61 Malignant neoplasm of prostate: Secondary | ICD-10-CM | POA: Diagnosis not present

## 2021-08-26 DIAGNOSIS — D381 Neoplasm of uncertain behavior of trachea, bronchus and lung: Secondary | ICD-10-CM | POA: Diagnosis not present

## 2021-08-26 DIAGNOSIS — K449 Diaphragmatic hernia without obstruction or gangrene: Secondary | ICD-10-CM | POA: Diagnosis not present

## 2021-08-26 DIAGNOSIS — Z23 Encounter for immunization: Secondary | ICD-10-CM | POA: Diagnosis not present

## 2021-08-26 DIAGNOSIS — I700 Atherosclerosis of aorta: Secondary | ICD-10-CM | POA: Diagnosis not present

## 2021-08-26 DIAGNOSIS — I712 Thoracic aortic aneurysm, without rupture, unspecified: Secondary | ICD-10-CM | POA: Diagnosis not present

## 2021-08-26 DIAGNOSIS — J984 Other disorders of lung: Secondary | ICD-10-CM | POA: Diagnosis not present

## 2021-08-28 ENCOUNTER — Other Ambulatory Visit: Payer: Self-pay

## 2021-08-28 ENCOUNTER — Encounter (HOSPITAL_BASED_OUTPATIENT_CLINIC_OR_DEPARTMENT_OTHER): Payer: Self-pay | Admitting: Emergency Medicine

## 2021-08-28 ENCOUNTER — Emergency Department (HOSPITAL_BASED_OUTPATIENT_CLINIC_OR_DEPARTMENT_OTHER): Payer: Medicare Other

## 2021-08-28 ENCOUNTER — Emergency Department (HOSPITAL_BASED_OUTPATIENT_CLINIC_OR_DEPARTMENT_OTHER)
Admission: EM | Admit: 2021-08-28 | Discharge: 2021-08-28 | Disposition: A | Discharge status: Home / Self Care | Payer: Medicare Other | Attending: Emergency Medicine | Admitting: Emergency Medicine

## 2021-08-28 DIAGNOSIS — Z7901 Long term (current) use of anticoagulants: Secondary | ICD-10-CM | POA: Insufficient documentation

## 2021-08-28 DIAGNOSIS — Z8546 Personal history of malignant neoplasm of prostate: Secondary | ICD-10-CM | POA: Diagnosis not present

## 2021-08-28 DIAGNOSIS — I480 Paroxysmal atrial fibrillation: Secondary | ICD-10-CM | POA: Insufficient documentation

## 2021-08-28 DIAGNOSIS — M25572 Pain in left ankle and joints of left foot: Secondary | ICD-10-CM | POA: Insufficient documentation

## 2021-08-28 MED ORDER — OXYCODONE-ACETAMINOPHEN 5-325 MG PO TABS
1.0000 | ORAL_TABLET | Freq: Once | ORAL | Status: AC
  Administered 2021-08-28: 1 via ORAL
  Filled 2021-08-28: qty 1

## 2021-08-28 NOTE — ED Provider Notes (Signed)
[facility] EMERGENCY DEPT Provider Note   CSN: [id number] Arrival date & time: 08/28/21  2011     History  Chief Complaint  Patient presents with   Ankle Pain    Brandon Simmons. is a 76 y.o. male.  Patient is a 76 year old male with a history of prostate cancer status post therapy, hyperlipidemia, paroxysmal atrial fibrillation on anticoagulation, prior DVT who is presenting today with sudden onset of left ankle pain.  Patient reports that he went hiking today which she normally does and he was wearing his hiking boots which are not new.  He had worn them around the house and noticed this evening that he was starting to get pain in his left ankle.  He remove the boots but reports the pain just progressively got much worse to where it was almost impossible for him to bear weight because it was so uncomfortable.  The pain is on the lateral portion of the left ankle.  He has not noticed any redness, heat or new swelling.  He always gets a little bit of edema in his lower extremities at the end of the day but has not noticed anything new.  He denies any injuries.  No prior history of similar symptoms.  He did take Tylenol at home and iced it with minimal improvement.  The history is provided by the patient.  Ankle Pain      Home Medications Prior to Admission medications   Medication Sig Start Date End Date Taking? Authorizing Provider  acetaminophen (TYLENOL) 500 MG tablet Take 500 mg by mouth 2 (two) times daily as needed.    [provider]  apixaban (ELIQUIS) 5 MG TABS tablet Take 5 mg by mouth 2 (two) times daily.     [provider]  atorvastatin (LIPITOR) 20 MG tablet Take 20 mg by mouth daily.    [provider]  diltiazem (CARDIZEM CD) 180 MG 24 hr capsule TAKE 1 CAPSULE (180 MG TOTAL) BY MOUTH DAILY IN THE AFTERNOON. 11/18/20   Sherran Needs, NP  diltiazem (CARDIZEM) 30 MG tablet TAKE 1 TABLET EVERY 4 HOURS AS NEEDED FOR AFIB RAPID  HEART RATE OVER 100 06/03/21   Sherran Needs, NP  EPINEPHrine 0.3 mg/0.3 mL IJ SOAJ injection Inject 0.3 mg into the muscle as needed for anaphylaxis. 04/06/19   [provider]  flecainide (TAMBOCOR) 100 MG tablet Take 1.5 tablets (150 mg total) by mouth 2 (two) times daily. 07/18/21   Camnitz, Will Hassell Done, MD  GLUCOSAMINE-CHONDROITIN PO Take 1,500 mg by mouth daily.    [provider]  Mometasone Furoate [facility] HFA) 100 MCG/ACT AERO Inhale 1 puff into the lungs every evening.    [provider]  montelukast (SINGULAIR) 10 MG tablet Take 10 mg by mouth at bedtime.    [provider]  Naphazoline-Pheniramine (OPCON-A) 0.027-0.315 % SOLN Place 1 drop into both eyes 2 (two) times daily as needed (eye allergies (red/irritated eyes)).    [provider]  NON FORMULARY Allergy Shots every 3 weeks    [provider]  omeprazole (PRILOSEC) 20 MG capsule Take 20 mg by mouth 2 (two) times daily before a meal.     [provider]  predniSONE (DELTASONE) 10 MG tablet 10 mg. 10- '20mg'$  as needed    [provider]  Probiotic Product (ALIGN) 4 MG CAPS Take 4 mg by mouth every evening.    [provider]  psyllium (METAMUCIL) 58.6 % powder Take 1  packet by mouth 2 (two) times daily.    [provider]  VENTOLIN HFA 108 (90 Base) MCG/ACT inhaler Inhale 1-2 puffs into the lungs every 6 (six) hours as needed for wheezing or shortness of breath. 12/30/18   [provider]  zolpidem (AMBIEN) 10 MG tablet Take 10 mg by mouth at bedtime as needed. 11/05/20   [provider]      Allergies    Patient has no known allergies.    Review of Systems   Review of Systems  Physical Exam Updated Vital Signs BP 124/74 (BP Location: Right Arm)   Pulse 97   Temp 97.8 F (36.6 C) (Oral)   Resp 20   Ht 5' 9.5" (1.765 m)   Wt 95.3 kg   SpO2 100%   BMI 30.57 kg/m  Physical Exam Vitals and nursing note reviewed.   Constitutional:      Appearance: Normal appearance.  HENT:     Head: Normocephalic.  Cardiovascular:     Rate and Rhythm: Normal rate.     Pulses: Normal pulses.  Pulmonary:     Effort: Pulmonary effort is normal.  Musculoskeletal:        General: Tenderness present.     Left ankle: Tenderness present over the lateral malleolus. No medial malleolus, base of 5th metatarsal or proximal fibula tenderness. Normal range of motion.     Comments: 1+ pitting edema in bilateral ankles.  In the left ankle tenderness in the posterior portion of the lateral malleolus and pain with plantarflexion.  No Achilles tenderness.  No warmth or erythema noted.  Skin:    General: Skin is warm and dry.  Neurological:     Mental Status: He is alert.  Psychiatric:        Mood and Affect: Mood normal.     ED Results / Procedures / Treatments   Labs (all labs ordered are listed, but only abnormal results are displayed) Labs Reviewed - No data to display  EKG None  Radiology DG Ankle Left Port  Result Date: 08/28/2021 CLINICAL DATA:  Atraumatic left ankle pain. EXAM: PORTABLE LEFT ANKLE - 2 VIEW COMPARISON:  None Available. FINDINGS: There is no evidence of an acute fracture, dislocation, or joint effusion. A small, chronic cortical density is seen adjacent to the distal tip of the left medial malleolus. There is no evidence of arthropathy or other focal bone abnormality. Mild diffuse soft tissue swelling is seen. IMPRESSION: Mild diffuse soft tissue swelling without an acute osseous abnormality. Electronically Signed   By: Virgina Norfolk M.D.   On: 08/28/2021 21:32    Procedures Procedures    Medications Ordered in ED Medications  oxyCODONE-acetaminophen (PERCOCET/ROXICET) 5-325 MG per tablet 1 tablet (1 tablet Oral Given 08/28/21 2221)    ED Course/ Medical Decision Making/ A&P                           Medical Decision Making Amount and/or Complexity of Data Reviewed Radiology: ordered and  independent interpretation performed. Decision-making details documented in ED Course.  Risk Prescription drug management.   Elderly male with multiple medical problems presenting today with sudden onset of pain in the left ankle.  Patient has normal pulses and low suspicion for acute vascular abnormality.  He also is anticoagulated and has been very active and low suspicion for DVT.  Patient has no symptoms classic for gout.  He was hiking today and was wearing his hiking boots.  Concern for possible acute inflammation from use in the fit of the boot.  However also possibility for stress fracture.  Patient denies any twisting of his ankle or falls. I have independently visualized and interpreted pt's images today. X-rays of the ankle are normal today.  Radiology reports diffuse soft tissue swelling without acute osseous abnormality.  Findings discussed with the patient and his wife.  He was given a tablet of pain medication here and placed in a cam walker.  He reports the pain is still there but he is able to bear weight better with the boot on.  Discussed with him trying Voltaren gel and Tylenol.  Wearing the boot when he is up moving around and elevating it when he is at rest.  He was given follow-up with orthopedic surgery and sports medicine to see if the pain is not improving as he may need an MRI in the future if the pain does not resolve.  He is unfortunately unable to take NSAIDs because he is on Eliquis.        Final Clinical Impression(s) / ED Diagnoses Final diagnoses:  Acute left ankle pain    Rx / DC Orders ED Discharge Orders     None         Blanchie Dessert, MD 08/28/21 2334

## 2021-08-28 NOTE — Discharge Instructions (Addendum)
Any of the above providers can see you for your ankle pain.  If Tylenol, Voltaren gel and the boot are not improving the pain and is still hurting within a week you should go to the walk-in clinic or follow-up with one of the above providers.  Ice and elevate.

## 2021-08-28 NOTE — ED Triage Notes (Signed)
Pt via pov from home; was hiking today and his left ankle began to hurt. Pt denies any injury to the ankle or any fall. Pt denies and prior hx with pain to this ankle; states he can't bear weight on it right now. Pt alert & oriented, nad noted.

## 2021-08-29 DIAGNOSIS — M25512 Pain in left shoulder: Secondary | ICD-10-CM | POA: Diagnosis not present

## 2021-08-29 DIAGNOSIS — M6388 Disorders of muscle in diseases classified elsewhere, other site: Secondary | ICD-10-CM | POA: Diagnosis not present

## 2021-08-29 DIAGNOSIS — M63812 Disorders of muscle in diseases classified elsewhere, left shoulder: Secondary | ICD-10-CM | POA: Diagnosis not present

## 2021-08-29 DIAGNOSIS — Z96612 Presence of left artificial shoulder joint: Secondary | ICD-10-CM | POA: Diagnosis not present

## 2021-08-29 DIAGNOSIS — M24812 Other specific joint derangements of left shoulder, not elsewhere classified: Secondary | ICD-10-CM | POA: Diagnosis not present

## 2021-08-29 DIAGNOSIS — R600 Localized edema: Secondary | ICD-10-CM | POA: Diagnosis not present

## 2021-08-29 DIAGNOSIS — R278 Other lack of coordination: Secondary | ICD-10-CM | POA: Diagnosis not present

## 2021-08-31 DIAGNOSIS — C61 Malignant neoplasm of prostate: Secondary | ICD-10-CM | POA: Diagnosis not present

## 2021-08-31 DIAGNOSIS — D381 Neoplasm of uncertain behavior of trachea, bronchus and lung: Secondary | ICD-10-CM | POA: Diagnosis not present

## 2021-09-01 DIAGNOSIS — Z96612 Presence of left artificial shoulder joint: Secondary | ICD-10-CM | POA: Diagnosis not present

## 2021-09-01 DIAGNOSIS — M25512 Pain in left shoulder: Secondary | ICD-10-CM | POA: Diagnosis not present

## 2021-09-01 DIAGNOSIS — M24812 Other specific joint derangements of left shoulder, not elsewhere classified: Secondary | ICD-10-CM | POA: Diagnosis not present

## 2021-09-01 DIAGNOSIS — R278 Other lack of coordination: Secondary | ICD-10-CM | POA: Diagnosis not present

## 2021-09-01 DIAGNOSIS — M63812 Disorders of muscle in diseases classified elsewhere, left shoulder: Secondary | ICD-10-CM | POA: Diagnosis not present

## 2021-09-01 DIAGNOSIS — R600 Localized edema: Secondary | ICD-10-CM | POA: Diagnosis not present

## 2021-09-01 DIAGNOSIS — M6388 Disorders of muscle in diseases classified elsewhere, other site: Secondary | ICD-10-CM | POA: Diagnosis not present

## 2021-09-05 DIAGNOSIS — M25512 Pain in left shoulder: Secondary | ICD-10-CM | POA: Diagnosis not present

## 2021-09-05 DIAGNOSIS — R600 Localized edema: Secondary | ICD-10-CM | POA: Diagnosis not present

## 2021-09-05 DIAGNOSIS — Z96612 Presence of left artificial shoulder joint: Secondary | ICD-10-CM | POA: Diagnosis not present

## 2021-09-05 DIAGNOSIS — R278 Other lack of coordination: Secondary | ICD-10-CM | POA: Diagnosis not present

## 2021-09-05 DIAGNOSIS — M6388 Disorders of muscle in diseases classified elsewhere, other site: Secondary | ICD-10-CM | POA: Diagnosis not present

## 2021-09-05 DIAGNOSIS — M24812 Other specific joint derangements of left shoulder, not elsewhere classified: Secondary | ICD-10-CM | POA: Diagnosis not present

## 2021-09-05 DIAGNOSIS — M63812 Disorders of muscle in diseases classified elsewhere, left shoulder: Secondary | ICD-10-CM | POA: Diagnosis not present

## 2021-09-13 ENCOUNTER — Encounter: Payer: Self-pay | Admitting: Cardiology

## 2021-09-13 DIAGNOSIS — R600 Localized edema: Secondary | ICD-10-CM | POA: Diagnosis not present

## 2021-09-13 DIAGNOSIS — J3081 Allergic rhinitis due to animal (cat) (dog) hair and dander: Secondary | ICD-10-CM | POA: Diagnosis not present

## 2021-09-13 DIAGNOSIS — J301 Allergic rhinitis due to pollen: Secondary | ICD-10-CM | POA: Diagnosis not present

## 2021-09-13 DIAGNOSIS — J3089 Other allergic rhinitis: Secondary | ICD-10-CM | POA: Diagnosis not present

## 2021-09-13 DIAGNOSIS — M63812 Disorders of muscle in diseases classified elsewhere, left shoulder: Secondary | ICD-10-CM | POA: Diagnosis not present

## 2021-09-13 DIAGNOSIS — Z96612 Presence of left artificial shoulder joint: Secondary | ICD-10-CM | POA: Diagnosis not present

## 2021-09-13 DIAGNOSIS — M25512 Pain in left shoulder: Secondary | ICD-10-CM | POA: Diagnosis not present

## 2021-09-13 DIAGNOSIS — M24812 Other specific joint derangements of left shoulder, not elsewhere classified: Secondary | ICD-10-CM | POA: Diagnosis not present

## 2021-09-13 DIAGNOSIS — R278 Other lack of coordination: Secondary | ICD-10-CM | POA: Diagnosis not present

## 2021-09-13 DIAGNOSIS — M6388 Disorders of muscle in diseases classified elsewhere, other site: Secondary | ICD-10-CM | POA: Diagnosis not present

## 2021-09-14 DIAGNOSIS — L821 Other seborrheic keratosis: Secondary | ICD-10-CM | POA: Diagnosis not present

## 2021-09-14 DIAGNOSIS — L570 Actinic keratosis: Secondary | ICD-10-CM | POA: Diagnosis not present

## 2021-09-14 DIAGNOSIS — L03011 Cellulitis of right finger: Secondary | ICD-10-CM | POA: Diagnosis not present

## 2021-09-14 DIAGNOSIS — D225 Melanocytic nevi of trunk: Secondary | ICD-10-CM | POA: Diagnosis not present

## 2021-09-14 DIAGNOSIS — L814 Other melanin hyperpigmentation: Secondary | ICD-10-CM | POA: Diagnosis not present

## 2021-09-15 DIAGNOSIS — M24812 Other specific joint derangements of left shoulder, not elsewhere classified: Secondary | ICD-10-CM | POA: Diagnosis not present

## 2021-09-15 DIAGNOSIS — M6388 Disorders of muscle in diseases classified elsewhere, other site: Secondary | ICD-10-CM | POA: Diagnosis not present

## 2021-09-15 DIAGNOSIS — M25512 Pain in left shoulder: Secondary | ICD-10-CM | POA: Diagnosis not present

## 2021-09-15 DIAGNOSIS — M63812 Disorders of muscle in diseases classified elsewhere, left shoulder: Secondary | ICD-10-CM | POA: Diagnosis not present

## 2021-09-15 DIAGNOSIS — Z96612 Presence of left artificial shoulder joint: Secondary | ICD-10-CM | POA: Diagnosis not present

## 2021-09-15 DIAGNOSIS — R278 Other lack of coordination: Secondary | ICD-10-CM | POA: Diagnosis not present

## 2021-09-15 DIAGNOSIS — R600 Localized edema: Secondary | ICD-10-CM | POA: Diagnosis not present

## 2021-09-15 NOTE — Telephone Encounter (Signed)
Followed up with pt after MD reviewed. Discussed Amiodarone bridge w/ ablation vs AFib clinic appt to discuss anti-arrhyhtmic medication. Pt reports he already has an appt w/ [facility] on 7/25.  Due to his traveling schedule (out of town 7/6 - 7/17) cannot get him seen sooner. Pt would like to discuss more w/ MD. Will keep 7/25 appt, pt will update [place] if issues prior to appt. Patient verbalized understanding and agreeable to plan.

## 2021-09-19 DIAGNOSIS — R278 Other lack of coordination: Secondary | ICD-10-CM | POA: Diagnosis not present

## 2021-09-19 DIAGNOSIS — Z96612 Presence of left artificial shoulder joint: Secondary | ICD-10-CM | POA: Diagnosis not present

## 2021-09-19 DIAGNOSIS — M63812 Disorders of muscle in diseases classified elsewhere, left shoulder: Secondary | ICD-10-CM | POA: Diagnosis not present

## 2021-09-19 DIAGNOSIS — M25512 Pain in left shoulder: Secondary | ICD-10-CM | POA: Diagnosis not present

## 2021-09-19 DIAGNOSIS — M24812 Other specific joint derangements of left shoulder, not elsewhere classified: Secondary | ICD-10-CM | POA: Diagnosis not present

## 2021-09-19 DIAGNOSIS — R600 Localized edema: Secondary | ICD-10-CM | POA: Diagnosis not present

## 2021-09-19 DIAGNOSIS — M6388 Disorders of muscle in diseases classified elsewhere, other site: Secondary | ICD-10-CM | POA: Diagnosis not present

## 2021-09-21 DIAGNOSIS — M24812 Other specific joint derangements of left shoulder, not elsewhere classified: Secondary | ICD-10-CM | POA: Diagnosis not present

## 2021-09-21 DIAGNOSIS — R278 Other lack of coordination: Secondary | ICD-10-CM | POA: Diagnosis not present

## 2021-09-21 DIAGNOSIS — M63812 Disorders of muscle in diseases classified elsewhere, left shoulder: Secondary | ICD-10-CM | POA: Diagnosis not present

## 2021-09-21 DIAGNOSIS — M6388 Disorders of muscle in diseases classified elsewhere, other site: Secondary | ICD-10-CM | POA: Diagnosis not present

## 2021-09-21 DIAGNOSIS — R600 Localized edema: Secondary | ICD-10-CM | POA: Diagnosis not present

## 2021-09-21 DIAGNOSIS — Z96612 Presence of left artificial shoulder joint: Secondary | ICD-10-CM | POA: Diagnosis not present

## 2021-09-21 DIAGNOSIS — M25512 Pain in left shoulder: Secondary | ICD-10-CM | POA: Diagnosis not present

## 2021-09-26 ENCOUNTER — Telehealth (HOSPITAL_COMMUNITY): Payer: Self-pay

## 2021-09-26 NOTE — Telephone Encounter (Signed)
Patient called regarding his A-fib. He has been having A-fib every morning and lasting for 2 hours. This morning his heart rate was 31 and increased to 40 range within time his heart rate went back to 60 range. He was feeling lightheaded and dizzy. His blood pressure 138/74. Dr. Curt Bears recently increased his Flecainide to '150mg'$  BID back in May.  Patient states he is feeling much better. He is leaving to go out of town in a few hours. Consulted with [employer] and we will not make any changes at this time. Patient is scheduled to see Dr. Curt Bears on 7/25 to discuss his A-fib. Communicated with patient and he verbalized understanding.

## 2021-10-04 DIAGNOSIS — J3081 Allergic rhinitis due to animal (cat) (dog) hair and dander: Secondary | ICD-10-CM | POA: Diagnosis not present

## 2021-10-04 DIAGNOSIS — J3089 Other allergic rhinitis: Secondary | ICD-10-CM | POA: Diagnosis not present

## 2021-10-04 DIAGNOSIS — J301 Allergic rhinitis due to pollen: Secondary | ICD-10-CM | POA: Diagnosis not present

## 2021-10-05 DIAGNOSIS — R278 Other lack of coordination: Secondary | ICD-10-CM | POA: Diagnosis not present

## 2021-10-05 DIAGNOSIS — Z96612 Presence of left artificial shoulder joint: Secondary | ICD-10-CM | POA: Diagnosis not present

## 2021-10-05 DIAGNOSIS — M6388 Disorders of muscle in diseases classified elsewhere, other site: Secondary | ICD-10-CM | POA: Diagnosis not present

## 2021-10-05 DIAGNOSIS — R600 Localized edema: Secondary | ICD-10-CM | POA: Diagnosis not present

## 2021-10-05 DIAGNOSIS — M63812 Disorders of muscle in diseases classified elsewhere, left shoulder: Secondary | ICD-10-CM | POA: Diagnosis not present

## 2021-10-05 DIAGNOSIS — M25512 Pain in left shoulder: Secondary | ICD-10-CM | POA: Diagnosis not present

## 2021-10-05 DIAGNOSIS — M24812 Other specific joint derangements of left shoulder, not elsewhere classified: Secondary | ICD-10-CM | POA: Diagnosis not present

## 2021-10-07 DIAGNOSIS — Z96612 Presence of left artificial shoulder joint: Secondary | ICD-10-CM | POA: Diagnosis not present

## 2021-10-07 DIAGNOSIS — M25512 Pain in left shoulder: Secondary | ICD-10-CM | POA: Diagnosis not present

## 2021-10-07 DIAGNOSIS — R600 Localized edema: Secondary | ICD-10-CM | POA: Diagnosis not present

## 2021-10-07 DIAGNOSIS — R278 Other lack of coordination: Secondary | ICD-10-CM | POA: Diagnosis not present

## 2021-10-07 DIAGNOSIS — M6388 Disorders of muscle in diseases classified elsewhere, other site: Secondary | ICD-10-CM | POA: Diagnosis not present

## 2021-10-07 DIAGNOSIS — M63812 Disorders of muscle in diseases classified elsewhere, left shoulder: Secondary | ICD-10-CM | POA: Diagnosis not present

## 2021-10-07 DIAGNOSIS — M24812 Other specific joint derangements of left shoulder, not elsewhere classified: Secondary | ICD-10-CM | POA: Diagnosis not present

## 2021-10-10 DIAGNOSIS — R600 Localized edema: Secondary | ICD-10-CM | POA: Diagnosis not present

## 2021-10-10 DIAGNOSIS — M24812 Other specific joint derangements of left shoulder, not elsewhere classified: Secondary | ICD-10-CM | POA: Diagnosis not present

## 2021-10-10 DIAGNOSIS — M63812 Disorders of muscle in diseases classified elsewhere, left shoulder: Secondary | ICD-10-CM | POA: Diagnosis not present

## 2021-10-10 DIAGNOSIS — M25512 Pain in left shoulder: Secondary | ICD-10-CM | POA: Diagnosis not present

## 2021-10-10 DIAGNOSIS — Z96612 Presence of left artificial shoulder joint: Secondary | ICD-10-CM | POA: Diagnosis not present

## 2021-10-10 DIAGNOSIS — M6388 Disorders of muscle in diseases classified elsewhere, other site: Secondary | ICD-10-CM | POA: Diagnosis not present

## 2021-10-10 DIAGNOSIS — R278 Other lack of coordination: Secondary | ICD-10-CM | POA: Diagnosis not present

## 2021-10-11 ENCOUNTER — Ambulatory Visit (INDEPENDENT_AMBULATORY_CARE_PROVIDER_SITE_OTHER): Payer: Medicare Other | Admitting: Cardiology

## 2021-10-11 ENCOUNTER — Encounter: Payer: Self-pay | Admitting: Cardiology

## 2021-10-11 VITALS — BP 122/70 | HR 57 | Ht 69.0 in | Wt 216.0 lb

## 2021-10-11 DIAGNOSIS — D6869 Other thrombophilia: Secondary | ICD-10-CM | POA: Diagnosis not present

## 2021-10-11 DIAGNOSIS — I480 Paroxysmal atrial fibrillation: Secondary | ICD-10-CM

## 2021-10-11 NOTE — Progress Notes (Signed)
Electrophysiology Office Note   Date:  10/11/2021   ID:  Brandon Alt., DOB 15-Oct-1945, MRN [id number]  PCP:  [name], MD  Cardiologist:  [facility] Primary Electrophysiologist:  Cierra Rothgeb Meredith Leeds, MD    Chief Complaint: AF   History of Present Illness: Brandon Simmons. is a 76 y.o. male who is being seen today for the evaluation of AF at the request of [name], MD. Presenting today for electrophysiology evaluation.  Has a history significant for atrial fibrillation, hyperlipidemia, ectatic aorta.  He is status post ablation x2 on 03/28/2018 and 11/11/2019.  He continued to have episodes of atrial fibrillation despite 2 ablations.  At his last visit, his flecainide was increased to 150 mg twice daily.  He had an exercise treadmill test that showed no lengthening of his QRS complex.  He unfortunately is continued to have episodes of atrial fibrillation.  He has intermittent episodes where his heart rate is 150 beats a minute, but also episodes where his heart rate is slow.  He feels weak and fatigued when he is in atrial fibrillation.  Today, denies symptoms of palpitations, chest pain, shortness of breath, orthopnea, PND, lower extremity edema, claudication, dizziness, presyncope, syncope, bleeding, or neurologic sequela. The patient is tolerating medications without difficulties.     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 4.3cm by CT 03/2018.   GERD (gastroesophageal reflux disease)    Hemorrhoids    History of echocardiogram    a. Echo 10/16 ([place]):  EF 55%, trace MR, mild TR, mild to mod AI, mild dilated Ao root (41 mm)   Hyperlipemia    Organic erectile dysfunction    PAF (paroxysmal atrial  fibrillation) ([place])    a. admx to [facility] in [place], [place] 10/16 with AF with RVR >> converted to NSR with IV Dilt;  b. Flecainide started >> ETT neg for pro-arrhythmia    Pneumonia 06/2020   Prostate cancer ([place]) 04/25/2006   Gleason 3+4=7   Prostatitis    S/P cardiac cath    a. LHC at [facility] 10/16:  LM ok, mLAD 30%, LCx ok, dRCA 20%   S/P radiation therapy 01/19/2014 through 03/05/2014                                                      Prostate bed 6600 cGy in 33 sessions                           Torn rotator cuff    right  40% tear   Past Surgical History:  Procedure Laterality Date   ABLATION OF DYSRHYTHMIC FOCUS  03/28/2018   APPENDECTOMY  1967   ATRIAL FIBRILLATION ABLATION N/A 03/28/2018   Procedure: ATRIAL FIBRILLATION ABLATION;  Surgeon: [name], MD;  Location: [place] [facility];  Service: Cardiovascular;  Laterality: N/A;   ATRIAL FIBRILLATION ABLATION N/A 11/11/2019   Procedure: ATRIAL FIBRILLATION ABLATION;  Surgeon: [name], MD;  Location: [place] [facility];  Service: Cardiovascular;  Laterality: N/A;   BACK SURGERY  2019   CARDIAC CATHETERIZATION  2016   [place]   CATARACT EXTRACTION W/ INTRAOCULAR LENS IMPLANT Left 11/2011   CATARACT EXTRACTION W/ INTRAOCULAR LENS IMPLANT Right 09/2015   COLONOSCOPY     LARYNX SURGERY  2000   vocal cord lesion- benign   LUMBAR LAMINECTOMY/DECOMPRESSION MICRODISCECTOMY Right 07/26/2017   Procedure: RIGHT LUMBAR 3- LUMBAR 4 FORAMINOTOMY WITH RESECTION OF SYNOVIAL CYST;  Surgeon: Erline Levine, MD;  Location: [facility];  Service: Neurosurgery;  Laterality: Right;  RIGHT LUMBAR 3- LUMBAR 4 FORAMINOTOMY WITH RESECTION OF SYNOVIAL CYST   MENISCUS REPAIR Right    POLYPECTOMY     PROSTATE BIOPSY  1999   PROSTATECTOMY  04/25/2006   REVERSE SHOULDER ARTHROPLASTY Left 09/02/2020   Procedure: REVERSE SHOULDER ARTHROPLASTY;  Surgeon: [name], MD;  Location: [facility];  Service: Orthopedics;  Laterality: Left;  120mn      Current Outpatient Medications  Medication Sig Dispense Refill   acetaminophen (TYLENOL) 500 MG tablet Take 500 mg by mouth 2 (two) times daily as needed.     apixaban (ELIQUIS) 5 MG TABS tablet Take 5 mg by mouth 2 (two) times daily.      atorvastatin (LIPITOR) 20 MG tablet Take 20 mg by mouth daily.     Azelastine HCl 137 MCG/SPRAY SOLN Inhale 1 spray into the lungs in the morning and at bedtime.     diltiazem (CARDIZEM CD) 180 MG 24 hr capsule TAKE 1 CAPSULE (180 MG TOTAL) BY MOUTH DAILY IN THE AFTERNOON. 90 capsule 3   diltiazem (CARDIZEM) 30 MG tablet TAKE 1 TABLET EVERY 4 HOURS AS NEEDED FOR AFIB RAPID HEART RATE OVER 100 540 tablet 1   EPINEPHrine 0.3 mg/0.3 mL IJ SOAJ injection Inject 0.3 mg into the muscle as needed for anaphylaxis.     fexofenadine (ALLEGRA ALLERGY) 180 MG tablet Take 1 tablet by mouth daily.     flecainide (TAMBOCOR) 100 MG tablet Take 1.5 tablets (150 mg total) by mouth 2 (two) times daily. 90 tablet 3   fluticasone (FLONASE) 50 MCG/ACT nasal spray Place 1 spray into both nostrils daily.     GLUCOSAMINE-CHONDROITIN PO Take 1,500 mg by mouth daily.     ipratropium (ATROVENT) 0.06 % nasal spray Place 2 sprays into both nostrils 3 (three) times daily.     Mometasone Furoate (ASMANEX HFA) 100 MCG/ACT AERO Inhale 1 puff into the lungs every evening.     montelukast (SINGULAIR) 10 MG tablet Take 10 mg by mouth at bedtime.     Naphazoline-Pheniramine (OPCON-A) 0.027-0.315 % SOLN Place 1 drop into both eyes 2 (two) times daily as needed (eye allergies (red/irritated eyes)).     NON FORMULARY Allergy Shots every 3 weeks     omeprazole (PRILOSEC) 20 MG capsule Take 20 mg by mouth 2 (two) times daily before a meal.      predniSONE (DELTASONE) 10 MG tablet 10 mg. 10- '20mg'$  as needed     Probiotic Product (ALIGN) 4 MG CAPS Take 4 mg by mouth every evening.     psyllium (METAMUCIL) 58.6 % powder Take 1 packet by mouth 2 (two) times daily.     VENTOLIN HFA 108 (90 Base)  MCG/ACT inhaler Inhale 1-2 puffs into the lungs every 6 (six) hours as needed for wheezing or shortness of breath.     No current facility-administered medications for this visit.  Allergies:   Patient has no known allergies.   Social History:  The patient  reports that he quit smoking about 51 years ago. His smoking use included cigarettes. He has a 10.00 pack-year smoking history. He has never used smokeless tobacco. He reports that he does not currently use alcohol. He reports that he does not use drugs.   Family History:  The patient's family history includes Arthritis in his mother; Atrial fibrillation in his son; Diabetes in his maternal grandmother; Emphysema in his father; Heart disease in his maternal grandfather, mother, and paternal grandfather; Hypertension in his mother; Liver cancer in his paternal grandmother; Lung cancer in his father.   ROS:  Please see the history of present illness.   Otherwise, review of systems is positive for none.   All other systems are reviewed and negative.   PHYSICAL EXAM: VS:  BP 122/70   Pulse (!) 57   Ht '5\' 9"'$  (1.753 m)   Wt 216 lb (98 kg)   SpO2 98%   BMI 31.90 kg/m  , BMI Body mass index is 31.9 kg/m. GEN: Well nourished, well developed, in no acute distress  HEENT: normal  Neck: no JVD, carotid bruits, or masses Cardiac: RRR; no murmurs, rubs, or gallops,no edema  Respiratory:  clear to auscultation bilaterally, normal work of breathing GI: soft, nontender, nondistended, + BS MS: no deformity or atrophy  Skin: warm and dry Neuro:  Strength and sensation are intact Psych: euthymic mood, full affect  EKG:  EKG is ordered today. Personal review of the ekg ordered shows sinus rhythm   Recent Labs: No results found for requested labs within last 365 days.    Lipid Panel  No results found for: "CHOL", "TRIG", "HDL", "CHOLHDL", "VLDL", "LDLCALC", "LDLDIRECT"   Wt Readings from Last 3 Encounters:  10/11/21 216 lb (98 kg)   08/28/21 210 lb (95.3 kg)  06/23/21 218 lb (98.9 kg)      Other studies Reviewed: Additional studies/ records that were reviewed today include: TTE 08/24/18  Review of the above records today demonstrates:   1. The left ventricle has normal systolic function with an ejection  fraction of 60-65%. The cavity size was normal. Left ventricular diastolic  Doppler parameters are consistent with pseudonormalization.   2. The right ventricle has normal systolic function. The cavity was  normal. There is no increase in right ventricular wall thickness.   3. No evidence of mitral valve stenosis.   4. Aortic valve regurgitation is mild by color flow Doppler. No stenosis  of the aortic valve.   5. There is mild dilatation of the aortic root and of the ascending  aorta.   6. The interatrial septum was not assessed.    ASSESSMENT AND PLAN:  1.  Paroxysmal atrial fibrillation: Status post ablation January 2020 with repeat ablation 11/11/2019.  Currently on flecainide 150 mg twice daily, diltiazem 180 mg daily, Eliquis 5 mg twice daily.  CHA2DS2-VASc of 3.  High risk medication monitoring for flecainide via ECG today.  He continues to have episodes of atrial fibrillation, though this is improved with his higher dose of flecainide.  He would prefer to avoid further management for now.  We Kaicen Desena continue with current management.  He does understand that his next option would be either amiodarone or repeat ablation.  2.  Secondary hypercoagulable state: Currently on Eliquis for atrial fibrillation as above  Current medicines are reviewed at length with the patient today.   The patient does not have  concerns regarding his medicines.  The following changes were made today: None  Labs/ tests ordered today include:  No orders of the defined types were placed in this encounter.    Disposition:   FU with Elliana Bal 6 months  Signed, Atleigh Gruen Meredith Leeds, MD  10/11/2021 11:54 AM     [facility] [address] [place] [address] [phone] (office) [phone] (fax)

## 2021-10-14 DIAGNOSIS — M25512 Pain in left shoulder: Secondary | ICD-10-CM | POA: Diagnosis not present

## 2021-10-14 DIAGNOSIS — R278 Other lack of coordination: Secondary | ICD-10-CM | POA: Diagnosis not present

## 2021-10-14 DIAGNOSIS — Z96612 Presence of left artificial shoulder joint: Secondary | ICD-10-CM | POA: Diagnosis not present

## 2021-10-14 DIAGNOSIS — R600 Localized edema: Secondary | ICD-10-CM | POA: Diagnosis not present

## 2021-10-14 DIAGNOSIS — M24812 Other specific joint derangements of left shoulder, not elsewhere classified: Secondary | ICD-10-CM | POA: Diagnosis not present

## 2021-10-14 DIAGNOSIS — M63812 Disorders of muscle in diseases classified elsewhere, left shoulder: Secondary | ICD-10-CM | POA: Diagnosis not present

## 2021-10-14 DIAGNOSIS — M6388 Disorders of muscle in diseases classified elsewhere, other site: Secondary | ICD-10-CM | POA: Diagnosis not present

## 2021-10-17 DIAGNOSIS — M24812 Other specific joint derangements of left shoulder, not elsewhere classified: Secondary | ICD-10-CM | POA: Diagnosis not present

## 2021-10-17 DIAGNOSIS — Z96612 Presence of left artificial shoulder joint: Secondary | ICD-10-CM | POA: Diagnosis not present

## 2021-10-17 DIAGNOSIS — R278 Other lack of coordination: Secondary | ICD-10-CM | POA: Diagnosis not present

## 2021-10-17 DIAGNOSIS — M6388 Disorders of muscle in diseases classified elsewhere, other site: Secondary | ICD-10-CM | POA: Diagnosis not present

## 2021-10-17 DIAGNOSIS — M63812 Disorders of muscle in diseases classified elsewhere, left shoulder: Secondary | ICD-10-CM | POA: Diagnosis not present

## 2021-10-17 DIAGNOSIS — R600 Localized edema: Secondary | ICD-10-CM | POA: Diagnosis not present

## 2021-10-17 DIAGNOSIS — M25512 Pain in left shoulder: Secondary | ICD-10-CM | POA: Diagnosis not present

## 2021-10-17 NOTE — Addendum Note (Signed)
Addended by: Ulice Brilliant T on: 10/17/2021 08:21 AM   Modules accepted: Orders

## 2021-10-20 DIAGNOSIS — R278 Other lack of coordination: Secondary | ICD-10-CM | POA: Diagnosis not present

## 2021-10-20 DIAGNOSIS — M24812 Other specific joint derangements of left shoulder, not elsewhere classified: Secondary | ICD-10-CM | POA: Diagnosis not present

## 2021-10-20 DIAGNOSIS — R600 Localized edema: Secondary | ICD-10-CM | POA: Diagnosis not present

## 2021-10-20 DIAGNOSIS — M25512 Pain in left shoulder: Secondary | ICD-10-CM | POA: Diagnosis not present

## 2021-10-20 DIAGNOSIS — M6388 Disorders of muscle in diseases classified elsewhere, other site: Secondary | ICD-10-CM | POA: Diagnosis not present

## 2021-10-20 DIAGNOSIS — Z96612 Presence of left artificial shoulder joint: Secondary | ICD-10-CM | POA: Diagnosis not present

## 2021-10-20 DIAGNOSIS — M63812 Disorders of muscle in diseases classified elsewhere, left shoulder: Secondary | ICD-10-CM | POA: Diagnosis not present

## 2021-10-21 DIAGNOSIS — J3089 Other allergic rhinitis: Secondary | ICD-10-CM | POA: Diagnosis not present

## 2021-10-21 DIAGNOSIS — J3081 Allergic rhinitis due to animal (cat) (dog) hair and dander: Secondary | ICD-10-CM | POA: Diagnosis not present

## 2021-10-21 DIAGNOSIS — J301 Allergic rhinitis due to pollen: Secondary | ICD-10-CM | POA: Diagnosis not present

## 2021-10-26 ENCOUNTER — Encounter: Payer: Self-pay | Admitting: Cardiology

## 2021-10-29 IMAGING — PT NM PET TUM IMG SKULL BASE T - THIGH
7 series · 25 of 25 positions shown · non-contrast
Comparison: PET evaluation from Sunday May, 2019.

CLINICAL DATA: Prostate carcinoma with biochemical recurrence.

EXAM:
NUCLEAR MEDICINE PET SKULL BASE TO THIGH
TECHNIQUE: 9.1 mCi F18 Piflufolastat (Pylarify) was injected intravenously.
Full-ring PET imaging was performed from the skull base to thigh
after the radiotracer. CT data was obtained and used for attenuation
correction and anatomic localization.

[Series 3: pet sk_thigh ac · axial · 5.0mm · 4.07mm/px · z∈[-304,+740]mm · 6 of 262 slices shown]
[im 1/262]
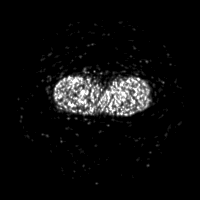
[im 53/262]
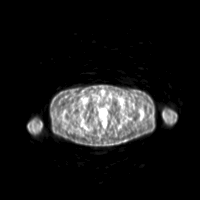
[im 105/262]
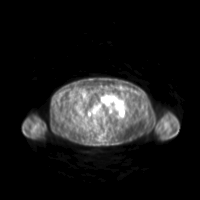
[im 157/262]
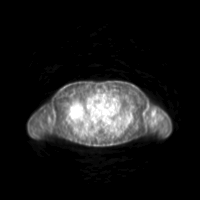
[im 209/262]
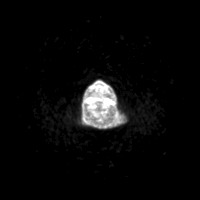
[im 262/262]
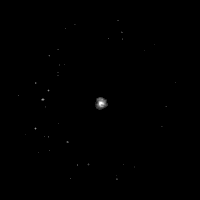

[Series 4: ct sk_thigh 5.0 hd_fov · axial · 5.0mm · 1.17mm/px · z∈[-304,+740]mm · 6 of 262 slices shown]
[im 1/262]
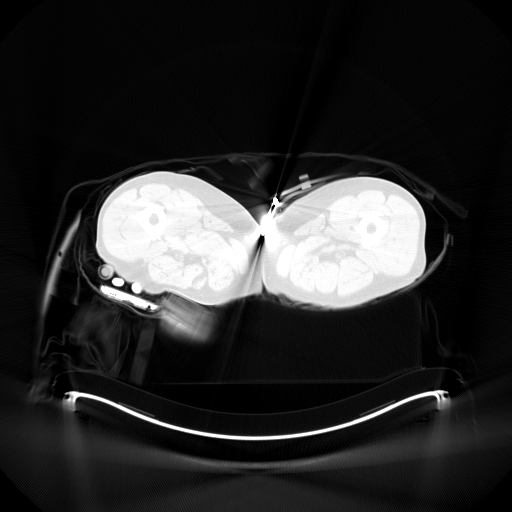
[im 53/262]
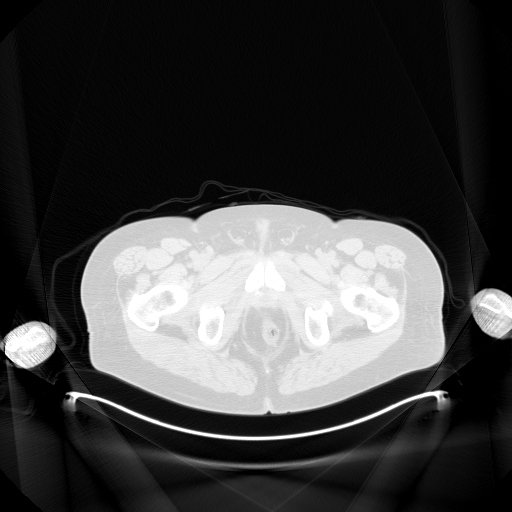
[im 105/262]
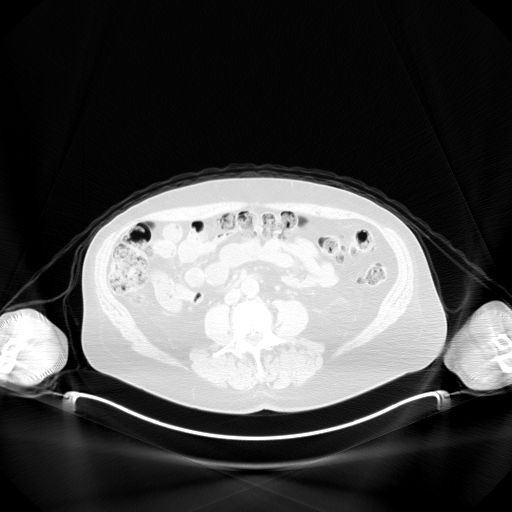
[im 157/262]
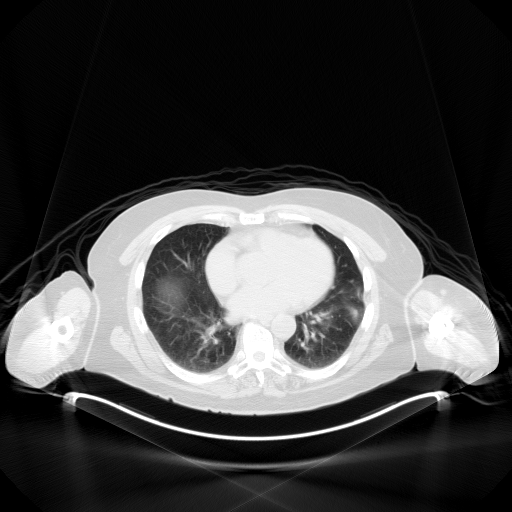
[im 209/262]
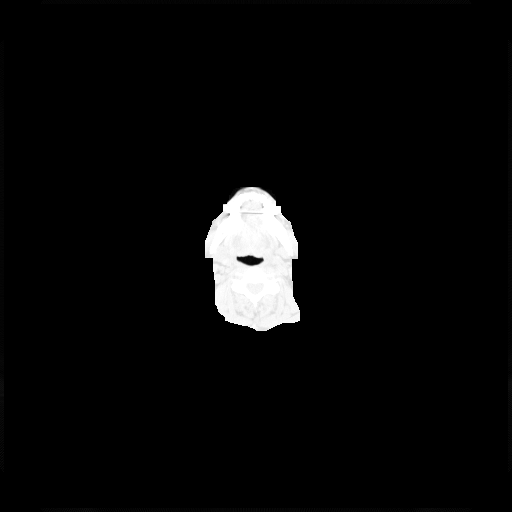
[im 262/262]
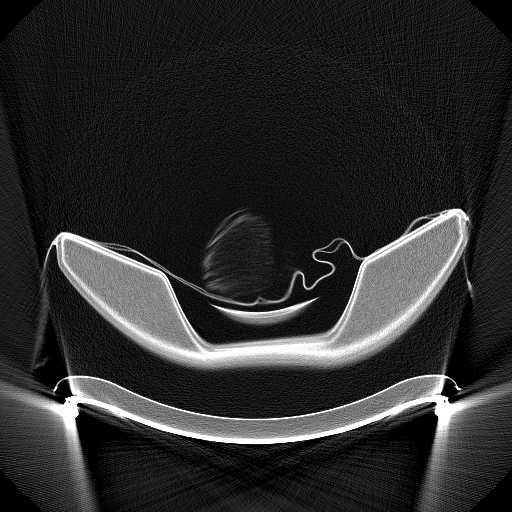

[Series 5: pet sk_thigh nac · axial · 5.0mm · 4.07mm/px · z∈[-304,+740]mm · 5 of 262 slices shown]
[im 1/262]
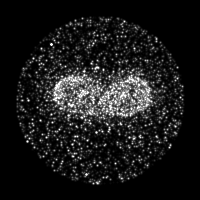
[im 66/262]
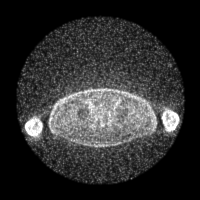
[im 131/262]
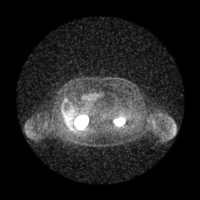
[im 196/262]
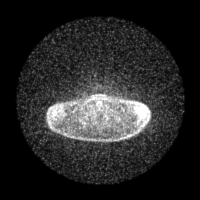
[im 262/262]
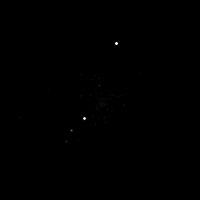

[Series 8: ct sk_thigh 5.0 br59 lung_bone · axial · 5.0mm · 0.80mm/px · 1 of 67 slices shown]
[im 1/67]
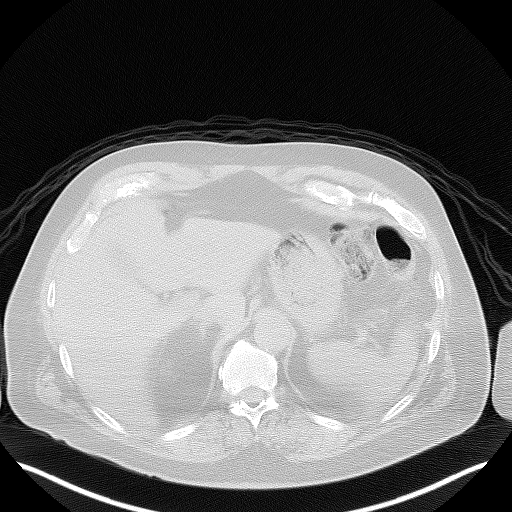

[Series 603: <mip collection> · coronal · 2.17mm/px · 1 of 32 slices shown]
[im 1/32]
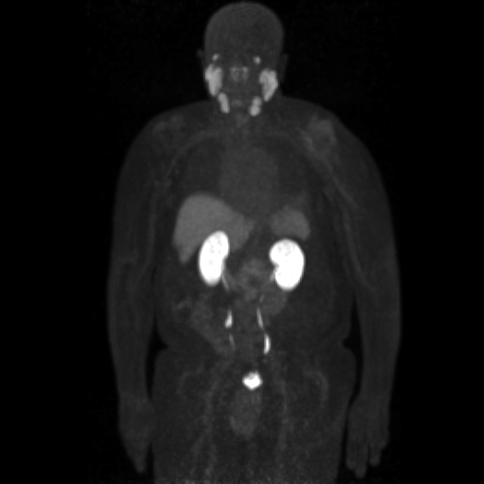

[Series 604: fused cor · 1 of 57 slices shown]
[im 1/57]
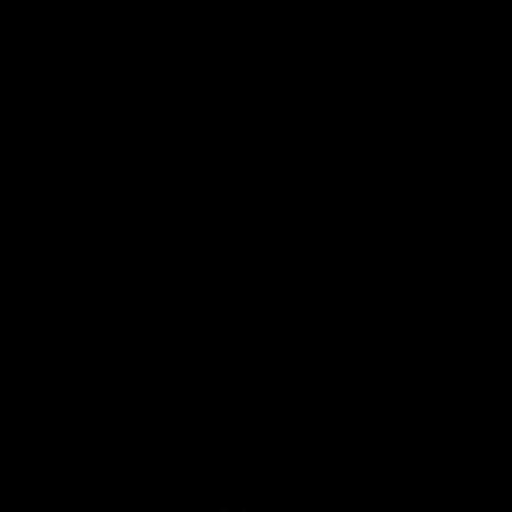

[Series 605: range-ct sk_thigh 5.0 hd_fov-tra-<alpha range> · 5 of 251 slices shown]
[im 1/251]
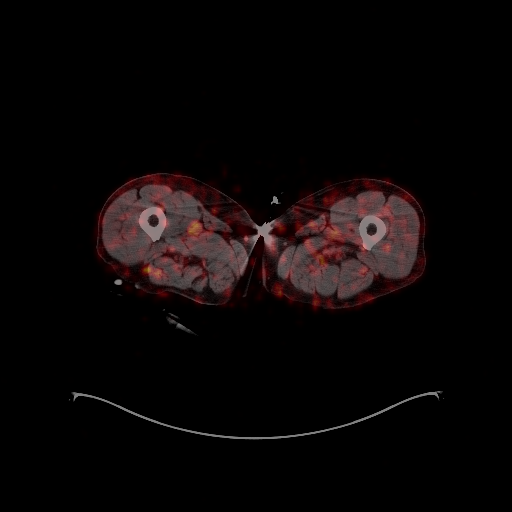
[im 63/251]
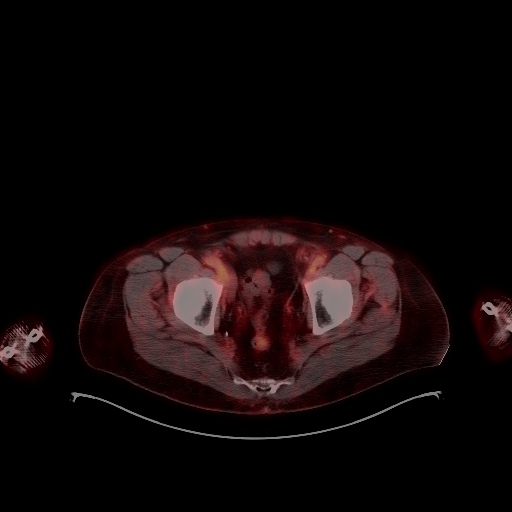
[im 126/251]
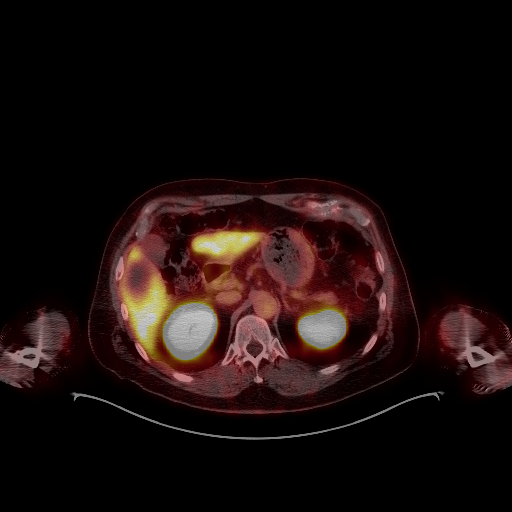
[im 188/251]
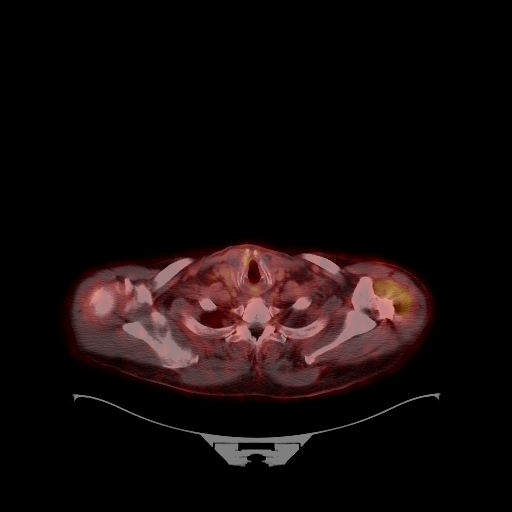
[im 251/251]
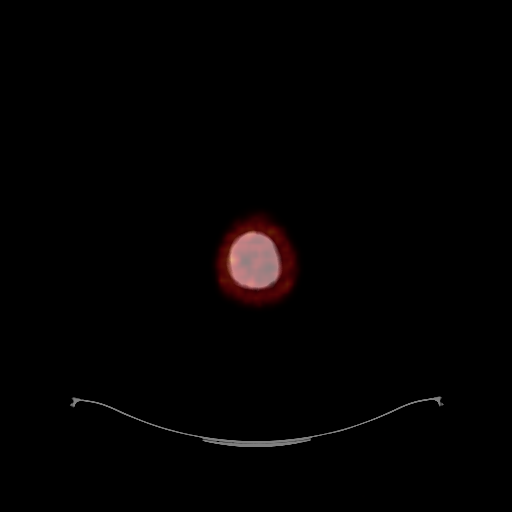

[25 of 25 positions shown; findings below may reference images not displayed]

FINDINGS: NECK

No radiotracer activity in neck lymph nodes.

Incidental CT finding: None

CHEST

No radiotracer accumulation within mediastinal or hilar lymph nodes.
No suspicious pulmonary nodules on the CT scan. Small nodule just
above the LEFT hemidiaphragm (image 53/8) approximately 8 x 8 mm,
approximately 7-8 mm on the previous study with respiratory motion
limiting assessment in this location. Equivocal radiotracer
accumulation is exhibited though difficult to assess due to juxta
diaphragmatic position and proximity to the LEFT hemidiaphragm and
spleen. Maximum SUV at approximately

Incidental CT finding: Scattered aortic atherosclerosis. No
aneurysm. Normal caliber of the central pulmonary vasculature.
Normal heart size.

Nodular parenchymal distortion in the LEFT lower lobe (image 50/8)
1.2 x 1.1 cm.

ABDOMEN/PELVIS

Prostate: Post prostatectomy. No focal activity in the prostatectomy
bed.

Lymph nodes: No abnormal radiotracer accumulation within pelvic or
abdominal nodes.

Liver: No evidence of liver metastasis

Incidental CT finding: Hepatic cyst. No acute findings relative to
liver, gallbladder, pancreas, spleen, adrenal glands, kidneys,
stomach, small or large bowel. Colonic diverticulosis.

Aortic atherosclerosis without aneurysm.  Post prostatectomy.

SKELETON

No focal  activity to suggest skeletal metastasis.
IMPRESSION: Pulmonary nodule in the LEFT lower lobe, may be potentially very
slightly increased compared to the previous PET-CT, very subtle
finding and not definitively seen on previous imaging of the chest.
Suggest dedicated chest CT for more complete evaluation as there is
motion on the current study. This could represent a single
metastatic focus but shows limited radiotracer accumulation.
Developing pulmonary neoplasm is also considered though the area is
not well characterized.

Architectural distortion also in the LEFT lower chest measuring up
to 1.2 x 1.1 cm on the current study. Dedicated chest CT will be
helpful to further characterize this abnormality. Currently
remaining indeterminate potentially developing neoplasm or scarring.

Colonic diverticulosis.

Aortic atherosclerosis.

Aortic Atherosclerosis ([id number]).

## 2021-11-07 ENCOUNTER — Encounter: Payer: Self-pay | Admitting: *Deleted

## 2021-11-07 NOTE — Telephone Encounter (Signed)
Left message

## 2021-11-08 DIAGNOSIS — R600 Localized edema: Secondary | ICD-10-CM | POA: Diagnosis not present

## 2021-11-08 DIAGNOSIS — M24812 Other specific joint derangements of left shoulder, not elsewhere classified: Secondary | ICD-10-CM | POA: Diagnosis not present

## 2021-11-08 DIAGNOSIS — Z96612 Presence of left artificial shoulder joint: Secondary | ICD-10-CM | POA: Diagnosis not present

## 2021-11-08 DIAGNOSIS — M6388 Disorders of muscle in diseases classified elsewhere, other site: Secondary | ICD-10-CM | POA: Diagnosis not present

## 2021-11-08 DIAGNOSIS — M25512 Pain in left shoulder: Secondary | ICD-10-CM | POA: Diagnosis not present

## 2021-11-08 DIAGNOSIS — M63812 Disorders of muscle in diseases classified elsewhere, left shoulder: Secondary | ICD-10-CM | POA: Diagnosis not present

## 2021-11-08 DIAGNOSIS — R278 Other lack of coordination: Secondary | ICD-10-CM | POA: Diagnosis not present

## 2021-11-09 NOTE — Telephone Encounter (Signed)
Answered pt questions. Aware I will follow up at later date w/ procedure instructions  Patient verbalized understanding and agreeable to plan.

## 2021-11-23 DIAGNOSIS — R058 Other specified cough: Secondary | ICD-10-CM | POA: Diagnosis not present

## 2021-11-23 DIAGNOSIS — R509 Fever, unspecified: Secondary | ICD-10-CM | POA: Diagnosis not present

## 2021-11-23 DIAGNOSIS — J189 Pneumonia, unspecified organism: Secondary | ICD-10-CM | POA: Diagnosis not present

## 2021-11-23 DIAGNOSIS — J45909 Unspecified asthma, uncomplicated: Secondary | ICD-10-CM | POA: Diagnosis not present

## 2021-11-23 DIAGNOSIS — I4891 Unspecified atrial fibrillation: Secondary | ICD-10-CM | POA: Diagnosis not present

## 2021-11-23 DIAGNOSIS — Z1152 Encounter for screening for COVID-19: Secondary | ICD-10-CM | POA: Diagnosis not present

## 2021-11-24 ENCOUNTER — Encounter: Payer: Self-pay | Admitting: *Deleted

## 2021-11-24 ENCOUNTER — Other Ambulatory Visit: Payer: Self-pay | Admitting: Cardiology

## 2021-11-24 ENCOUNTER — Telehealth: Payer: Self-pay | Admitting: *Deleted

## 2021-11-24 DIAGNOSIS — Z01812 Encounter for preprocedural laboratory examination: Secondary | ICD-10-CM

## 2021-11-24 DIAGNOSIS — I4819 Other persistent atrial fibrillation: Secondary | ICD-10-CM

## 2021-11-24 NOTE — Telephone Encounter (Signed)
Reviewed upcoming procedure instructions w/ pt and sent via mychart. Pt will stop by the office next week for blood work. Aware someone will call to arrange pre ablation CT testing and post procedure follow up. Patient verbalized understanding and agreeable to plan.

## 2021-11-27 ENCOUNTER — Inpatient Hospital Stay (HOSPITAL_BASED_OUTPATIENT_CLINIC_OR_DEPARTMENT_OTHER)
Admission: EM | Admit: 2021-11-27 | Discharge: 2021-11-30 | DRG: 193 | Disposition: A | Discharge status: Home / Self Care | Payer: Medicare Other | Attending: Family Medicine | Admitting: Family Medicine

## 2021-11-27 ENCOUNTER — Other Ambulatory Visit: Payer: Self-pay

## 2021-11-27 ENCOUNTER — Emergency Department (HOSPITAL_BASED_OUTPATIENT_CLINIC_OR_DEPARTMENT_OTHER): Payer: Medicare Other | Admitting: Radiology

## 2021-11-27 ENCOUNTER — Encounter (HOSPITAL_BASED_OUTPATIENT_CLINIC_OR_DEPARTMENT_OTHER): Payer: Self-pay | Admitting: Emergency Medicine

## 2021-11-27 DIAGNOSIS — J189 Pneumonia, unspecified organism: Principal | ICD-10-CM | POA: Diagnosis present

## 2021-11-27 DIAGNOSIS — Z8249 Family history of ischemic heart disease and other diseases of the circulatory system: Secondary | ICD-10-CM

## 2021-11-27 DIAGNOSIS — Z87891 Personal history of nicotine dependence: Secondary | ICD-10-CM

## 2021-11-27 DIAGNOSIS — E876 Hypokalemia: Secondary | ICD-10-CM

## 2021-11-27 DIAGNOSIS — Z96612 Presence of left artificial shoulder joint: Secondary | ICD-10-CM | POA: Diagnosis present

## 2021-11-27 DIAGNOSIS — C61 Malignant neoplasm of prostate: Secondary | ICD-10-CM | POA: Diagnosis present

## 2021-11-27 DIAGNOSIS — Z7901 Long term (current) use of anticoagulants: Secondary | ICD-10-CM

## 2021-11-27 DIAGNOSIS — Z8546 Personal history of malignant neoplasm of prostate: Secondary | ICD-10-CM

## 2021-11-27 DIAGNOSIS — E785 Hyperlipidemia, unspecified: Secondary | ICD-10-CM | POA: Diagnosis present

## 2021-11-27 DIAGNOSIS — J45909 Unspecified asthma, uncomplicated: Secondary | ICD-10-CM | POA: Diagnosis present

## 2021-11-27 DIAGNOSIS — K219 Gastro-esophageal reflux disease without esophagitis: Secondary | ICD-10-CM | POA: Diagnosis present

## 2021-11-27 DIAGNOSIS — R0602 Shortness of breath: Secondary | ICD-10-CM | POA: Diagnosis not present

## 2021-11-27 DIAGNOSIS — G9341 Metabolic encephalopathy: Secondary | ICD-10-CM

## 2021-11-27 DIAGNOSIS — R4182 Altered mental status, unspecified: Secondary | ICD-10-CM | POA: Diagnosis not present

## 2021-11-27 DIAGNOSIS — Z923 Personal history of irradiation: Secondary | ICD-10-CM

## 2021-11-27 DIAGNOSIS — Z825 Family history of asthma and other chronic lower respiratory diseases: Secondary | ICD-10-CM | POA: Diagnosis not present

## 2021-11-27 DIAGNOSIS — I480 Paroxysmal atrial fibrillation: Secondary | ICD-10-CM | POA: Diagnosis present

## 2021-11-27 DIAGNOSIS — Z20822 Contact with and (suspected) exposure to covid-19: Secondary | ICD-10-CM | POA: Diagnosis present

## 2021-11-27 DIAGNOSIS — J9811 Atelectasis: Secondary | ICD-10-CM | POA: Diagnosis not present

## 2021-11-27 DIAGNOSIS — Z86718 Personal history of other venous thrombosis and embolism: Secondary | ICD-10-CM | POA: Diagnosis not present

## 2021-11-27 DIAGNOSIS — A419 Sepsis, unspecified organism: Secondary | ICD-10-CM | POA: Diagnosis not present

## 2021-11-27 DIAGNOSIS — E871 Hypo-osmolality and hyponatremia: Secondary | ICD-10-CM | POA: Diagnosis present

## 2021-11-27 DIAGNOSIS — Z79899 Other long term (current) drug therapy: Secondary | ICD-10-CM | POA: Diagnosis not present

## 2021-11-27 DIAGNOSIS — E860 Dehydration: Secondary | ICD-10-CM | POA: Diagnosis present

## 2021-11-27 LAB — CBC
HCT: 34.7 % — ABNORMAL LOW (ref 39.0–52.0)
Hemoglobin: 12.5 g/dL — ABNORMAL LOW (ref 13.0–17.0)
MCH: 32.2 pg (ref 26.0–34.0)
MCHC: 36 g/dL (ref 30.0–36.0)
MCV: 89.4 fL (ref 80.0–100.0)
Platelets: 146 10*3/uL — ABNORMAL LOW (ref 150–400)
RBC: 3.88 MIL/uL — ABNORMAL LOW (ref 4.22–5.81)
RDW: 11.9 % (ref 11.5–15.5)
WBC: 9.1 10*3/uL (ref 4.0–10.5)
nRBC: 0 % (ref 0.0–0.2)

## 2021-11-27 LAB — COMPREHENSIVE METABOLIC PANEL
ALT: 31 U/L (ref 0–44)
AST: 48 U/L — ABNORMAL HIGH (ref 15–41)
Albumin: 3.8 g/dL (ref 3.5–5.0)
Alkaline Phosphatase: 52 U/L (ref 38–126)
Anion gap: 9 (ref 5–15)
BUN: 9 mg/dL (ref 8–23)
CO2: 24 mmol/L (ref 22–32)
Calcium: 8.9 mg/dL (ref 8.9–10.3)
Chloride: 95 mmol/L — ABNORMAL LOW (ref 98–111)
Creatinine, Ser: 0.99 mg/dL (ref 0.61–1.24)
GFR, Estimated: >60 mL/min (ref >60)
Glucose, Bld: 124 mg/dL — ABNORMAL HIGH (ref 70–99)
Potassium: 3.1 mmol/L — ABNORMAL LOW (ref 3.5–5.1)
Sodium: 128 mmol/L — ABNORMAL LOW (ref 135–145)
Total Bilirubin: 0.9 mg/dL (ref 0.3–1.2)
Total Protein: 6.7 g/dL (ref 6.5–8.1)

## 2021-11-27 LAB — RESP PANEL BY RT-PCR (FLU A&B, COVID) ARPGX2
Influenza A by PCR: NEGATIVE
Influenza B by PCR: NEGATIVE
SARS Coronavirus 2 by RT PCR: NEGATIVE

## 2021-11-27 LAB — CBG MONITORING, ED: Glucose-Capillary: 123 mg/dL — ABNORMAL HIGH (ref 70–99)

## 2021-11-27 MED ORDER — RISAQUAD PO CAPS
1.0000 | ORAL_CAPSULE | Freq: Every evening | ORAL | Status: DC
Start: 2021-11-27 — End: 2021-11-30
  Administered 2021-11-27 – 2021-11-29 (×3): 1 via ORAL
  Filled 2021-11-27 (×3): qty 1

## 2021-11-27 MED ORDER — NAPHAZOLINE-PHENIRAMINE 0.025-0.3 % OP SOLN: 1.0000 [drp] | Freq: Two times a day (BID) | OPHTHALMIC | Status: DC | PRN

## 2021-11-27 MED ORDER — IPRATROPIUM BROMIDE 0.06 % NA SOLN
2.0000 | Freq: Three times a day (TID) | NASAL | Status: DC
  Administered 2021-11-27 – 2021-11-30 (×8): 2 via NASAL
  Filled 2021-11-27: qty 15

## 2021-11-27 MED ORDER — ACETAMINOPHEN 500 MG PO TABS
500.0000 mg | ORAL_TABLET | Freq: Two times a day (BID) | ORAL | Status: DC | PRN
  Administered 2021-11-28 – 2021-11-30 (×2): 500 mg via ORAL
  Filled 2021-11-27 (×2): qty 1

## 2021-11-27 MED ORDER — SODIUM CHLORIDE 0.9 % IV SOLN
1.0000 g | Freq: Once | INTRAVENOUS | Status: AC
  Administered 2021-11-27: 1 g via INTRAVENOUS
  Filled 2021-11-27: qty 10

## 2021-11-27 MED ORDER — LACTATED RINGERS IV SOLN: INTRAVENOUS | Status: DC

## 2021-11-27 MED ORDER — DOXYCYCLINE HYCLATE 100 MG PO TABS
100.0000 mg | ORAL_TABLET | Freq: Once | ORAL | Status: AC
  Administered 2021-11-27: 100 mg via ORAL
  Filled 2021-11-27: qty 1

## 2021-11-27 MED ORDER — SODIUM CHLORIDE 0.9 % IV SOLN
1.0000 g | INTRAVENOUS | Status: DC
  Administered 2021-11-28 – 2021-11-29 (×2): 1 g via INTRAVENOUS
  Filled 2021-11-27 (×2): qty 10

## 2021-11-27 MED ORDER — POTASSIUM CHLORIDE CRYS ER 20 MEQ PO TBCR
40.0000 meq | EXTENDED_RELEASE_TABLET | Freq: Once | ORAL | Status: AC
  Administered 2021-11-27: 40 meq via ORAL
  Filled 2021-11-27: qty 2

## 2021-11-27 MED ORDER — SODIUM CHLORIDE 0.9 % IV SOLN
INTRAVENOUS | Status: DC | PRN
  Administered 2021-11-27: 10 mL/h via INTRAVENOUS

## 2021-11-27 MED ORDER — ALBUTEROL SULFATE HFA 108 (90 BASE) MCG/ACT IN AERS
2.0000 | INHALATION_SPRAY | RESPIRATORY_TRACT | Status: DC | PRN
  Administered 2021-11-27 – 2021-11-29 (×3): 2 via RESPIRATORY_TRACT
  Filled 2021-11-27: qty 6.7

## 2021-11-27 MED ORDER — ATORVASTATIN CALCIUM 10 MG PO TABS
20.0000 mg | ORAL_TABLET | Freq: Every day | ORAL | Status: DC
  Administered 2021-11-28 – 2021-11-30 (×3): 20 mg via ORAL
  Filled 2021-11-27 (×3): qty 2

## 2021-11-27 MED ORDER — BENZONATATE 100 MG PO CAPS
200.0000 mg | ORAL_CAPSULE | Freq: Once | ORAL | Status: AC
Start: 2021-11-27 — End: 2021-11-27
  Administered 2021-11-27: 200 mg via ORAL
  Filled 2021-11-27: qty 2

## 2021-11-27 MED ORDER — POTASSIUM CHLORIDE 10 MEQ/100ML IV SOLN
10.0000 meq | Freq: Once | INTRAVENOUS | Status: AC
  Administered 2021-11-27: 10 meq via INTRAVENOUS
  Filled 2021-11-27: qty 100

## 2021-11-27 MED ORDER — MONTELUKAST SODIUM 10 MG PO TABS
10.0000 mg | ORAL_TABLET | Freq: Every day | ORAL | Status: DC
  Administered 2021-11-28 – 2021-11-29 (×2): 10 mg via ORAL
  Filled 2021-11-27 (×2): qty 1

## 2021-11-27 MED ORDER — DILTIAZEM HCL ER COATED BEADS 180 MG PO CP24
180.0000 mg | ORAL_CAPSULE | Freq: Every day | ORAL | Status: DC
  Administered 2021-11-27 – 2021-11-29 (×3): 180 mg via ORAL
  Filled 2021-11-27 (×4): qty 1

## 2021-11-27 MED ORDER — PSYLLIUM 95 % PO PACK
1.0000 | PACK | Freq: Two times a day (BID) | ORAL | Status: DC
  Administered 2021-11-27 – 2021-11-30 (×6): 1 via ORAL
  Filled 2021-11-27 (×6): qty 1

## 2021-11-27 MED ORDER — ALBUTEROL SULFATE HFA 108 (90 BASE) MCG/ACT IN AERS
INHALATION_SPRAY | RESPIRATORY_TRACT | Status: AC
  Filled 2021-11-27: qty 6.7

## 2021-11-27 MED ORDER — ACETAMINOPHEN 500 MG PO TABS
1000.0000 mg | ORAL_TABLET | Freq: Once | ORAL | Status: AC
  Administered 2021-11-27: 1000 mg via ORAL
  Filled 2021-11-27: qty 2

## 2021-11-27 MED ORDER — FLUTICASONE PROPIONATE 50 MCG/ACT NA SUSP
1.0000 | Freq: Every day | NASAL | Status: DC
  Administered 2021-11-27 – 2021-11-30 (×4): 1 via NASAL
  Filled 2021-11-27: qty 16

## 2021-11-27 MED ORDER — FLECAINIDE ACETATE 50 MG PO TABS
150.0000 mg | ORAL_TABLET | Freq: Two times a day (BID) | ORAL | Status: DC
  Administered 2021-11-27 – 2021-11-30 (×6): 150 mg via ORAL
  Filled 2021-11-27 (×6): qty 1

## 2021-11-27 MED ORDER — LACTATED RINGERS IV SOLN: INTRAVENOUS | Status: AC

## 2021-11-27 MED ORDER — SODIUM CHLORIDE 0.9 % IV SOLN
100.0000 mg | Freq: Two times a day (BID) | INTRAVENOUS | Status: DC
  Administered 2021-11-28 – 2021-11-30 (×5): 100 mg via INTRAVENOUS
  Filled 2021-11-27 (×5): qty 100

## 2021-11-27 MED ORDER — ALIGN 4 MG PO CAPS: 4.0000 mg | ORAL_CAPSULE | Freq: Every evening | ORAL | Status: DC

## 2021-11-27 MED ORDER — PANTOPRAZOLE SODIUM 40 MG PO TBEC
40.0000 mg | DELAYED_RELEASE_TABLET | Freq: Every day | ORAL | Status: DC
  Administered 2021-11-28 – 2021-11-30 (×3): 40 mg via ORAL
  Filled 2021-11-27 (×3): qty 1

## 2021-11-27 MED ORDER — AZELASTINE HCL 0.1 % NA SOLN
1.0000 | Freq: Two times a day (BID) | NASAL | Status: DC
Start: 2021-11-27 — End: 2021-11-30
  Administered 2021-11-27 – 2021-11-30 (×6): 1 via NASAL
  Filled 2021-11-27: qty 30

## 2021-11-27 MED ORDER — APIXABAN 5 MG PO TABS
5.0000 mg | ORAL_TABLET | Freq: Two times a day (BID) | ORAL | Status: DC
  Administered 2021-11-27 – 2021-11-30 (×6): 5 mg via ORAL
  Filled 2021-11-27 (×6): qty 1

## 2021-11-27 MED ORDER — LORATADINE 10 MG PO TABS
10.0000 mg | ORAL_TABLET | Freq: Every day | ORAL | Status: DC
  Administered 2021-11-27 – 2021-11-30 (×4): 10 mg via ORAL
  Filled 2021-11-27 (×4): qty 1

## 2021-11-27 MED ORDER — BENZONATATE 100 MG PO CAPS
200.0000 mg | ORAL_CAPSULE | Freq: Three times a day (TID) | ORAL | Status: DC | PRN
  Administered 2021-11-28 – 2021-11-30 (×5): 200 mg via ORAL
  Filled 2021-11-27 (×5): qty 2

## 2021-11-27 MED ORDER — BUDESONIDE 0.25 MG/2ML IN SUSP
0.2500 mg | Freq: Two times a day (BID) | RESPIRATORY_TRACT | Status: DC
  Administered 2021-11-27 – 2021-11-30 (×6): 0.25 mg via RESPIRATORY_TRACT
  Filled 2021-11-27 (×6): qty 2

## 2021-11-27 NOTE — Assessment & Plan Note (Addendum)
Continue IV fluid hydration and monitor with morning labs.

## 2021-11-27 NOTE — Assessment & Plan Note (Addendum)
Right upper lobe pneumonia.  Continue IV Rocephin and doxycycline. -add on blood culture but has already received a dose of antibiotics in ED

## 2021-11-27 NOTE — Treatment Plan (Signed)
Patient is a 76 year old male with history of paroxysmal A-fib, hyperlipidemia who presented to med Center at [facility] with complaints of fever, shortness of breath, cough that started about 3 to 4 days ago.  He was recently diagnosed with pneumonia by his PCP and was given azithromycin.  Azithromycin was changed to amoxicillin due to concern for drug interaction with Eliquis .  Patient did not improve with amoxicillin and continued to have weakness, fever also developed delirium and progressively more short of breath so he presented to Med Center.  On presentation, he remained hemodynamically stable.  Lab work showed potassium of 3.1, sodium of 128.  Chest x-ray showed right upper lobe pneumonia.  Patient being admitted for the management of community-acquired pneumonia.  Started on ceftriaxone, doxycycline.

## 2021-11-27 NOTE — Assessment & Plan Note (Signed)
Hx of prostatectomy with recurrence s/p radiation and now under observation with [facility]

## 2021-11-27 NOTE — ED Triage Notes (Signed)
Dx Wednesday with pna, tx with amox since Thursday, no better. And is now confused. Cough, fever, short of breath.negative for covid and flu at MD

## 2021-11-27 NOTE — ED Provider Notes (Signed)
[facility] Provider Note   CSN: [id number] Arrival date & time: 11/27/21  1256     History  Chief Complaint  Patient presents with   Altered Mental Status    Brandon Simmons. is a 76 y.o. male.  Patient is a 76 year old male with a history of asthma, prostate cancer status postradiation, paroxysmal atrial fibrillation, prior DVT who is presenting today with worsening cough, fever and now confusion.  Patient reports on Tuesday evening he had a 3-hour episode of vomiting and Wednesday morning woke up with a bad cough and feeling generally terrible.  He followed up with his he PCP and at that time was diagnosed with pneumonia.  He initially was given a prescription for azithromycin however way when they went to the pharmacy they recommended that that would not be a good medication with his other meds that he was on.  They then called their doctor back who called them in a prescription for amoxicillin but they were not able to pick it up until the next day.  Because he could not start the antibiotic on Wednesday his wife gave him 2 doses of azithromycin until they could fill the amoxicillin on Thursday.  He has been taking amoxicillin since Thursday but has had a persistent cough, poor appetite, persistent fever up to 102.  He feels short of breath with activity and denies any diarrhea or vomiting since Tuesday.  His wife reports in the last 24 hours also he is started developing symptoms of delirium.  It has been intermittent but sometimes he says he is going to meet his son who lives upstairs and their son has not lived with him for years.  Patient reports he generally just feels terrible.  The history is provided by the patient and the spouse.  Altered Mental Status      Home Medications Prior to Admission medications   Medication Sig Start Date End Date Taking? Authorizing Provider  acetaminophen (TYLENOL) 500 MG tablet Take 500 mg by mouth 2 (two) times  daily as needed.    [provider]  apixaban (ELIQUIS) 5 MG TABS tablet Take 5 mg by mouth 2 (two) times daily.     [provider]  atorvastatin (LIPITOR) 20 MG tablet Take 20 mg by mouth daily.    [provider]  Azelastine HCl 137 MCG/SPRAY SOLN Inhale 1 spray into the lungs in the morning and at bedtime. 08/19/21   [provider]  diltiazem (CARDIZEM CD) 180 MG 24 hr capsule TAKE 1 CAPSULE (180 MG TOTAL) BY MOUTH DAILY IN THE AFTERNOON. 11/18/20   Sherran Needs, NP  diltiazem (CARDIZEM) 30 MG tablet TAKE 1 TABLET EVERY 4 HOURS AS NEEDED FOR AFIB RAPID HEART RATE OVER 100 06/03/21   Sherran Needs, NP  EPINEPHrine 0.3 mg/0.3 mL IJ SOAJ injection Inject 0.3 mg into the muscle as needed for anaphylaxis. 04/06/19   [provider]  fexofenadine (ALLEGRA ALLERGY) 180 MG tablet Take 1 tablet by mouth daily. 08/17/21   [provider]  flecainide (TAMBOCOR) 100 MG tablet TAKE 1.5 TABLETS BY MOUTH 2 TIMES DAILY. 11/24/21   Camnitz, Will Hassell Done, MD  fluticasone (FLONASE) 50 MCG/ACT nasal spray Place 1 spray into both nostrils daily. 08/19/21   [provider]  GLUCOSAMINE-CHONDROITIN PO Take 1,500 mg by mouth daily.    [provider]  ipratropium (ATROVENT) 0.06 % nasal spray Place 2 sprays into both nostrils 3 (three) times daily. 10/04/21  [provider]  Mometasone Furoate [facility] HFA) 100 MCG/ACT AERO Inhale 1 puff into the lungs every evening.    [provider]  montelukast (SINGULAIR) 10 MG tablet Take 10 mg by mouth at bedtime.    [provider]  Naphazoline-Pheniramine (OPCON-A) 0.027-0.315 % SOLN Place 1 drop into both eyes 2 (two) times daily as needed (eye allergies (red/irritated eyes)).    [provider]  NON FORMULARY Allergy Shots every 3 weeks    [provider]  omeprazole (PRILOSEC) 20 MG capsule Take 20 mg by mouth 2 (two) times daily before a meal.     [provider]  predniSONE (DELTASONE) 10 MG tablet 10 mg. 10- '20mg'$  as needed    [provider]  Probiotic Product (ALIGN) 4 MG CAPS Take 4 mg by mouth every evening.    [provider]  psyllium (METAMUCIL) 58.6 % powder Take 1 packet by mouth 2 (two) times daily.    [provider]  VENTOLIN HFA 108 (90 Base) MCG/ACT inhaler Inhale 1-2 puffs into the lungs every 6 (six) hours as needed for wheezing or shortness of breath. 12/30/18   [provider]      Allergies    Patient has no known allergies.    Review of Systems   Review of Systems  Physical Exam Updated Vital Signs BP 122/76 (BP Location: Left Arm)   Pulse 86   Temp (!) 102 F (38.9 C) (Axillary)   Resp (!) 22   Ht '5\' 9"'$  (1.753 m)   Wt 98 kg   SpO2 100%   BMI 31.91 kg/m  Physical Exam Vitals and nursing note reviewed.  Constitutional:      General: He is not in acute distress.    Appearance: He is well-developed.  HENT:     Head: Normocephalic and atraumatic.  Eyes:     Conjunctiva/sclera: Conjunctivae normal.     Pupils: Pupils are equal, round, and reactive to light.  Cardiovascular:     Rate and Rhythm: Normal rate and regular rhythm.     Pulses: Normal pulses.     Heart sounds: No murmur heard. Pulmonary:     Effort: Pulmonary effort is normal. Tachypnea present. No respiratory distress.     Breath sounds: Rhonchi present. No wheezing or rales.     Comments: Coughing continually on exam with rhonchi heard in the right upper lobe Abdominal:     General: There is no distension.     Palpations: Abdomen is soft.     Tenderness: There is no abdominal tenderness. There is no guarding or rebound.  Musculoskeletal:        General: No tenderness. Normal range of motion.     Cervical back: Normal range of motion and neck supple.     Right lower leg: Edema present.     Left lower leg: Edema present.     Comments: Trace edema in bilateral lower extremities  Skin:    General:  Skin is warm and dry.     Findings: No erythema or rash.  Neurological:     Mental Status: He is alert and oriented to person, place, and time. Mental status is at baseline.     Sensory: No sensory deficit.     Motor: No weakness.  Psychiatric:        Mood and Affect: Mood normal.        Behavior: Behavior normal.     ED Results / Procedures / Treatments  Labs (all labs ordered are listed, but only abnormal results are displayed) Labs Reviewed  COMPREHENSIVE METABOLIC PANEL - Abnormal; Notable for the following components:      Result Value   Sodium 128 (*)    Potassium 3.1 (*)    Chloride 95 (*)    Glucose, Bld 124 (*)    AST 48 (*)    All other components within normal limits  CBC - Abnormal; Notable for the following components:   RBC 3.88 (*)    Hemoglobin 12.5 (*)    HCT 34.7 (*)    Platelets 146 (*)    All other components within normal limits  CBG MONITORING, ED - Abnormal; Notable for the following components:   Glucose-Capillary 123 (*)    All other components within normal limits  RESP PANEL BY RT-PCR (FLU A&B, COVID) ARPGX2  URINALYSIS, ROUTINE W REFLEX MICROSCOPIC    EKG EKG Interpretation  Date/Time:  Sunday November 27 2021 14:06:13 EDT Ventricular Rate:  61 PR Interval:  208 QRS Duration: 104 QT Interval:  420 QTC Calculation: 422 R Axis:   -1 Text Interpretation: Normal sinus rhythm Normal ECG When compared with ECG of 17-Nov-2020 11:38, No significant change was found Confirmed by Blanchie Dessert [phone]) on 11/27/2021 4:30:22 PM  Radiology DG Chest 2 View  Result Date: 11/27/2021 CLINICAL DATA:  Shortness of breath EXAM: CHEST - 2 VIEW COMPARISON:  02/17/2021 FINDINGS: Cardiac size is within normal limits. There are no signs of pulmonary edema. There is alveolar infiltrate in right upper lobe consistent with pneumonia. Rest of the lung fields are clear. There is no significant pleural effusion or pneumothorax. There is previous reverse  arthroplasty left shoulder. Degenerative changes are noted in right AC joint. IMPRESSION: There is moderate sized alveolar infiltrate in right upper lung field consistent with right upper lobe pneumonia. Electronically Signed   By: Elmer Picker M.D.   On: 11/27/2021 14:43    Procedures Procedures    Medications Ordered in ED Medications  albuterol (VENTOLIN HFA) 108 (90 Base) MCG/ACT inhaler 2 puff (2 puffs Inhalation Given 11/27/21 1611)  albuterol (VENTOLIN HFA) 108 (90 Base) MCG/ACT inhaler (  Not Given 11/27/21 1621)  cefTRIAXone (ROCEPHIN) 1 g in sodium chloride 0.9 % 100 mL IVPB (1 g Intravenous New Bag/Given 11/27/21 1747)  0.9 %  sodium chloride infusion (10 mL/hr Intravenous New Bag/Given 11/27/21 1744)  lactated ringers infusion (has no administration in time range)  potassium chloride 10 mEq in 100 mL IVPB (has no administration in time range)  potassium chloride SA (KLOR-CON M) CR tablet 40 mEq (has no administration in time range)  doxycycline (VIBRA-TABS) tablet 100 mg (100 mg Oral Given 11/27/21 1735)  benzonatate (TESSALON) capsule 200 mg (200 mg Oral Given 11/27/21 1735)  acetaminophen (TYLENOL) tablet 1,000 mg (1,000 mg Oral Given 11/27/21 1755)    ED Course/ Medical Decision Making/ A&P                           Medical Decision Making Amount and/or Complexity of Data Reviewed Independent Historian: spouse External Data Reviewed: notes.    Details: Urgent care Labs: ordered. Decision-making details documented in ED Course. Radiology: ordered and independent interpretation performed. Decision-making details documented in ED Course. ECG/medicine tests: ordered and independent interpretation performed. Decision-making details documented in ED Course.  Risk OTC drugs. Prescription drug management. Decision regarding hospitalization.   Pt with multiple medical problems and comorbidities and presenting today with a  complaint that caries a high risk for morbidity and  mortality.  Or today with persistent cough, fever and now symptoms consistent with delirium.  Patient was diagnosed with pneumonia on Wednesday which is worsening.  Patient sats are okay on room air however patient is febrile here to 102 with rhonchi in the right upper lobe.  He has no abdominal pain or findings significant for fluid overload.  I independently interpreted patient's EKG and labs.  EKG shows no acute findings.  Labs today show a new hyponatremia of 128 and hypokalemia of 3.1.  Renal function is preserved, CBC with white blood cell count of 9 with stable platelets and hemoglobin. COVID and flu are negative. I have independently visualized and interpreted pt's images today.  X-ray today shows a right upper lobe pneumonia.  Given patient's failure now on outpatient therapy as he has now completed 5 days of antibiotics and symptoms are worsening with persistent fever and now delirium will admit for further care and IV antibiotics.  Patient's potassium was replaced, Rocephin and doxycycline were given.  Consulted with the hospitalist for admission.  Findings were discussed with the patient and his wife.  They are comfortable with this plan.  Patient appears to require admission at this time.         Final Clinical Impression(s) / ED Diagnoses Final diagnoses:  Community acquired pneumonia of right upper lobe of lung  Hyponatremia  Hypokalemia  Sepsis without acute organ dysfunction, due to unspecified organism [facility])    Rx / DC Orders ED Discharge Orders     None         Blanchie Dessert, MD 11/27/21 1802

## 2021-11-27 NOTE — H&P (Signed)
History and Physical    Patient: Brandon Simmons. UMP:[id number] DOB: 07/01/1945 DOA: 11/27/2021 DOS: the patient was seen and examined on 11/27/2021 PCP: [name], MD  Patient coming from:  [place] ED  Chief Complaint:  Chief Complaint  Patient presents with   Altered Mental Status   HPI: Cassey Rilley Stash. is a 76 y.o. male with medical history significant of atrial fibrillation s/p repeated ablation on Eliquis, prostate cancer, asthma, provoked DVT who presents with shortness of breath, cough and fever.  Patient was treated for pneumonia last week with azithromycin.  Later switched to amoxicillin due to interaction with Eliquis.  He presented to urgent care today with altered mental status and was advised to present to the ED. Has been short of breath with non-productive cough. Today woke up and though he had to help his son but son does not live with him.   In the ED, he was febrile up to 102F, normotensive on room air. No leukocytosis, mild anemia hemoglobin 12.5. Sodium of 128, K of 3.1.  Normal creatinine.  Chest x-ray shows right upper lobe infiltrate concerning for pneumonia.  He was started on IV Rocephin and doxycycline. Review of Systems: As mentioned in the history of present illness. All other systems reviewed and are negative. Past Medical History:  Diagnosis Date   Aortic atherosclerosis ([place])    Arthritis    Asthma    Diverticulitis    Diverticulosis    DVT (deep venous thrombosis) ([place]) 2019   right leg after back surgery   Dysrhythmia    A-fib every few weeks   Ectatic thoracic aorta ([place])    a. 4.3cm by CT 03/2018.   GERD (gastroesophageal reflux disease)    Hemorrhoids    History of echocardiogram    a. Echo 10/16 ([place]):  EF 55%, trace MR, mild TR, mild to mod AI, mild dilated Ao root (41 mm)   Hyperlipemia    Organic erectile dysfunction    PAF (paroxysmal atrial fibrillation) ([place])    a. admx to [facility] in [place], [place]  10/16 with AF with RVR >> converted to NSR with IV Dilt;  b. Flecainide started >> ETT neg for pro-arrhythmia    Pneumonia 06/2020   Prostate cancer ([place]) 04/25/2006   Gleason 3+4=7   Prostatitis    S/P cardiac cath    a. LHC at [facility] 10/16:  LM ok, mLAD 30%, LCx ok, dRCA 20%   S/P radiation therapy 01/19/2014 through 03/05/2014                                                      Prostate bed 6600 cGy in 33 sessions                           Torn rotator cuff    right  40% tear   Past Surgical History:  Procedure Laterality Date   ABLATION OF DYSRHYTHMIC FOCUS  03/28/2018   APPENDECTOMY  1967   ATRIAL FIBRILLATION ABLATION N/A 03/28/2018   Procedure: ATRIAL FIBRILLATION ABLATION;  Surgeon: [name], MD;  Location: [place] CV LAB;  Service: Cardiovascular;  Laterality: N/A;   ATRIAL FIBRILLATION ABLATION N/A 11/11/2019   Procedure: ATRIAL FIBRILLATION ABLATION;  Surgeon: [name], MD;  Location: [facility] CV LAB;  Service: Cardiovascular;  Laterality: N/A;   BACK SURGERY  2019   CARDIAC CATHETERIZATION  2016   [place]   CATARACT EXTRACTION W/ INTRAOCULAR LENS IMPLANT Left 11/2011   CATARACT EXTRACTION W/ INTRAOCULAR LENS IMPLANT Right 09/2015   COLONOSCOPY     LARYNX SURGERY  2000   vocal cord lesion- benign   LUMBAR LAMINECTOMY/DECOMPRESSION MICRODISCECTOMY Right 07/26/2017   Procedure: RIGHT LUMBAR 3- LUMBAR 4 FORAMINOTOMY WITH RESECTION OF SYNOVIAL CYST;  Surgeon: Erline Levine, MD;  Location: [facility];  Service: Neurosurgery;  Laterality: Right;  RIGHT LUMBAR 3- LUMBAR 4 FORAMINOTOMY WITH RESECTION OF SYNOVIAL CYST   MENISCUS REPAIR Right    POLYPECTOMY     PROSTATE BIOPSY  1999   PROSTATECTOMY  04/25/2006   REVERSE SHOULDER ARTHROPLASTY Left 09/02/2020   Procedure: REVERSE SHOULDER ARTHROPLASTY;  Surgeon: [name], MD;  Location: [facility];  Service: Orthopedics;  Laterality: Left;  19mn   Social History:  reports that he quit smoking about 51 years ago.  His smoking use included cigarettes. He has a 10.00 pack-year smoking history. He has never used smokeless tobacco. He reports that he does not currently use alcohol. He reports that he does not use drugs. Retired November 2022 [place]   No Known Allergies  Family History  Problem Relation Age of Onset   Emphysema Father    Lung cancer Father    Hypertension Mother    Heart disease Mother        heart attack   Arthritis Mother    Diabetes Maternal Grandmother    Heart disease Maternal Grandfather        Died age 77  Liver cancer Paternal Grandmother    Heart disease Paternal Grandfather        Died age 77  Atrial fibrillation Son    Stomach cancer Neg Hx    Rectal cancer Neg Hx    Esophageal cancer Neg Hx    Colon cancer Neg Hx     Prior to Admission medications   Medication Sig Start Date End Date Taking? Authorizing Provider  acetaminophen (TYLENOL) 500 MG tablet Take 500 mg by mouth 2 (two) times daily as needed.   Yes [provider]  amoxicillin (AMOXIL) 500 MG capsule Take 500 mg by mouth 2 (two) times daily. Started 11-24-21 for 1 week.   Yes [provider]  apixaban (ELIQUIS) 5 MG TABS tablet Take 5 mg by mouth 2 (two) times daily.    Yes [provider]  atorvastatin (LIPITOR) 20 MG tablet Take 20 mg by mouth daily.   Yes [provider]  Azelastine HCl 137 MCG/SPRAY SOLN Inhale 1 spray into the lungs in the morning and at bedtime. 08/19/21  Yes [provider]  diltiazem (CARDIZEM CD) 180 MG 24 hr capsule TAKE 1 CAPSULE (180 MG TOTAL) BY MOUTH DAILY IN THE AFTERNOON. 11/18/20  Yes CSherran Needs NP  diltiazem (CARDIZEM) 30 MG tablet TAKE 1 TABLET EVERY 4 HOURS AS NEEDED FOR AFIB RAPID HEART RATE OVER 100 06/03/21  Yes CSherran Needs NP  fe[facility]ofenadine ([facility]) 180 MG tablet Take 1 tablet by mouth daily. 08/17/21  Yes [provider]  flecainide (TAMBOCOR)  100 MG tablet TAKE 1.5 TABLETS BY MOUTH 2 TIMES DAILY. 11/24/21  Yes Camnitz, Will MHassell Done MD  fluticasone (FLONASE) 50 MCG/ACT nasal spray Place 1 spray into both nostrils daily. 08/19/21  Yes [provider]  GLUCOSAMINE-CHONDROITIN PO Take 1,500 mg by mouth daily.   Yes [provider]  ipratropium (ATROVENT) 0.06 % nasal spray Place 2 sprays into both nostrils 3 (three) times daily. 10/04/21  Yes [provider]  Mometasone Furoate (ASMANEX HFA) 100 MCG/ACT AERO Inhale 1 puff into the lungs every evening.   Yes [provider]  montelukast (SINGULAIR) 10 MG tablet Take 10 mg by mouth at bedtime.   Yes [provider]  Naphazoline-Pheniramine (OPCON-A) 0.027-0.315 % SOLN Place 1 drop into both eyes 2 (two) times daily as needed (eye allergies (red/irritated eyes)).   Yes [provider]  omeprazole (PRILOSEC) 20 MG capsule Take 20 mg by mouth 2 (two) times daily before a meal.    Yes [provider]  Probiotic Product (ALIGN) 4 MG CAPS Take 4 mg by mouth every evening.   Yes [provider]  psyllium (METAMUCIL) 58.6 % powder Take 1 packet by mouth 2 (two) times daily.   Yes [provider]  VENTOLIN HFA 108 (90 Base) MCG/ACT inhaler Inhale 1-2 puffs into the lungs every 6 (six) hours as needed for wheezing or shortness of breath. 12/30/18  Yes [provider]  EPINEPHrine 0.3 mg/0.3 mL IJ SOAJ injection Inject 0.3 mg into the muscle as needed for anaphylaxis. 04/06/19   [provider]  NON FORMULARY Allergy Shots every 3 weeks    [provider]  predniSONE (DELTASONE) 10 MG tablet 10 mg. 10- '20mg'$  as needed Patient not taking: Reported on 11/27/2021    [provider]    Physical Exam: Vitals:   11/27/21 1700 11/27/21 1749 11/27/21 1841 11/27/21 1846  BP: 137/65 122/76 129/60   Pulse: 77 86 76   Resp: 20 (!) 22 20   Temp:  (!) 102 F (38.9 C) 98.9 F (37.2 C)   TempSrc:   Axillary Oral   SpO2: 97% 100% 97%   Weight:    99 kg  Height:    '5\' 9"'$  (1.753 m)   Constitutional: NAD, calm, comfortable, nontoxic appearing elderly gentleman sitting upright in bed Eyes: lids and conjunctivae normal ENMT: Mucous membranes are moist.  Neck: normal, supple, no masses, no thyromegaly Respiratory: Diminished upper lobe lung sounds bilaterally no wheezing, no crackles. Normal respiratory effort. No accessory muscle use.  Able to speak in full sentences. Cardiovascular: Regular rate and rhythm, no murmurs / rubs / gallops. No extremity edema.  Abdomen:, Soft, nondistended and nontender.  Bowel sounds positive.  Musculoskeletal: no clubbing / cyanosis. No joint deformity upper and lower extremities. Good ROM, no contractures. Normal muscle tone.  Skin: no rashes, lesions, ulcers.  Neurologic: CN 2-12 grossly intact. Strength 5/5 in all 4.  Psychiatric: Normal judgment and insight. Alert and oriented x 3. Normal mood. Data Reviewed:  See HPI  Assessment and Plan: * Community acquired pneumonia Right upper lobe pneumonia.  Continue IV Rocephin and doxycycline. -add on blood culture but has already received a dose of antibiotics in ED  Acute metabolic encephalopathy Secondary to pneumonia and hyponatremia  Hypokalemia Repleted with oral K and IV K x1.  Follow repeat in the morning.  Hyponatremia Continue IV fluid hydration and monitor with morning labs.  PAF (paroxysmal atrial fibrillation) (H[facility]) Rate controlled. - Continue diltiazem and flecainide - Continue Eliquis -s/p ablation x 2 but remains symptomatic and has planned ablation next month  Malignant neoplasm of prostate ([place]) Hx of prostatectomy with recurrence s/p radiation and now under observation with [facility]      Advance Care Planning:   Code Status: Full Code Full   Consults: none  Family Communication: Discussed with wife at bedside  Severity of Illness: The appropriate patient  status for this patient is INPATIENT. Inpatient status is judged to be reasonable and necessary in order to provide the required intensity of service to ensure the patient's safety. The patient's presenting symptoms, physical exam findings, and initial radiographic and laboratory data in the context of their chronic comorbidities is felt to place them at high risk for further clinical deterioration. Furthermore, it is not anticipated that the patient will be medically stable for discharge from the hospital within 2 midnights of admission.   * I certify that at the point of admission it is my clinical judgment that the patient will require inpatient hospital care spanning beyond 2 midnights from the point of admission due to high intensity of service, high risk for further deterioration and high frequency of surveillance required.*  Author: Orene Desanctis, DO 11/27/2021 8:41 PM  For on call review [email or web address].

## 2021-11-27 NOTE — Assessment & Plan Note (Addendum)
Secondary to pneumonia and hyponatremia

## 2021-11-27 NOTE — Assessment & Plan Note (Addendum)
Rate controlled. - Continue diltiazem and flecainide - Continue Eliquis -s/p ablation x 2 but remains symptomatic and has planned ablation next month

## 2021-11-27 NOTE — Progress Notes (Signed)
Patient seen and assessed per Nursing staff request. Persistent dry cough noted with clear BBS. Recent pneumonia diagnosis and has been on two antibiotics for about a week. Patient states he is a lifelong asthmatic and often has coughing fits when his asthma flares. He uses an asmanex inhaler at home but has cut back to using it only one puff once daily instead of the usual dose of 2 puffs twice daily. BD order placed and administered.

## 2021-11-27 NOTE — Progress Notes (Signed)
Patient arrived to unit via EMS. Telemetry monitor placed VS taken. Wife, Romie Minus, at bedside. Call bell placed within reach.

## 2021-11-27 NOTE — Assessment & Plan Note (Signed)
Repleted with oral K and IV K x1.  Follow repeat in the morning.

## 2021-11-27 NOTE — ED Notes (Signed)
[facility] called. 

## 2021-11-28 ENCOUNTER — Telehealth: Payer: Self-pay | Admitting: Pulmonary Disease

## 2021-11-28 DIAGNOSIS — J189 Pneumonia, unspecified organism: Secondary | ICD-10-CM

## 2021-11-28 LAB — BASIC METABOLIC PANEL
Anion gap: 10 (ref 5–15)
BUN: 10 mg/dL (ref 8–23)
CO2: 23 mmol/L (ref 22–32)
Calcium: 8.6 mg/dL — ABNORMAL LOW (ref 8.9–10.3)
Chloride: 100 mmol/L (ref 98–111)
Creatinine, Ser: 0.91 mg/dL (ref 0.61–1.24)
GFR, Estimated: >60 mL/min (ref >60)
Glucose, Bld: 125 mg/dL — ABNORMAL HIGH (ref 70–99)
Potassium: 3.4 mmol/L — ABNORMAL LOW (ref 3.5–5.1)
Sodium: 133 mmol/L — ABNORMAL LOW (ref 135–145)

## 2021-11-28 LAB — CBC
HCT: 34.1 % — ABNORMAL LOW (ref 39.0–52.0)
Hemoglobin: 11.8 g/dL — ABNORMAL LOW (ref 13.0–17.0)
MCH: 31.7 pg (ref 26.0–34.0)
MCHC: 34.6 g/dL (ref 30.0–36.0)
MCV: 91.7 fL (ref 80.0–100.0)
Platelets: 141 10*3/uL — ABNORMAL LOW (ref 150–400)
RBC: 3.72 MIL/uL — ABNORMAL LOW (ref 4.22–5.81)
RDW: 11.8 % (ref 11.5–15.5)
WBC: 9.9 10*3/uL (ref 4.0–10.5)
nRBC: 0 % (ref 0.0–0.2)

## 2021-11-28 MED ORDER — LACTATED RINGERS IV SOLN: INTRAVENOUS | Status: DC

## 2021-11-28 MED ORDER — POTASSIUM CHLORIDE 20 MEQ PO PACK
40.0000 meq | PACK | Freq: Once | ORAL | Status: AC
  Administered 2021-11-28: 40 meq via ORAL
  Filled 2021-11-28 (×2): qty 2

## 2021-11-28 MED ORDER — ZOLPIDEM TARTRATE 5 MG PO TABS
5.0000 mg | ORAL_TABLET | Freq: Once | ORAL | Status: AC
  Administered 2021-11-28: 5 mg via ORAL
  Filled 2021-11-28: qty 1

## 2021-11-28 MED ORDER — HYDROCODONE BIT-HOMATROP MBR 5-1.5 MG/5ML PO SOLN
5.0000 mL | Freq: Four times a day (QID) | ORAL | Status: DC | PRN
  Administered 2021-11-28 – 2021-11-30 (×5): 5 mL via ORAL
  Filled 2021-11-28 (×5): qty 5

## 2021-11-28 MED ORDER — GUAIFENESIN 100 MG/5ML PO LIQD
5.0000 mL | ORAL | Status: DC | PRN
  Administered 2021-11-28 – 2021-11-29 (×3): 5 mL via ORAL
  Filled 2021-11-28 (×3): qty 10

## 2021-11-28 MED ORDER — INFLUENZA VAC A&B SA ADJ QUAD 0.5 ML IM PRSY
0.5000 mL | PREFILLED_SYRINGE | INTRAMUSCULAR | Status: DC
  Filled 2021-11-28: qty 0.5

## 2021-11-28 NOTE — Telephone Encounter (Signed)
Routing to Dr. Mannam as an FYI. 

## 2021-11-28 NOTE — Progress Notes (Signed)
PROGRESS NOTE    Brandon Simmons Section.  QPY:[id number] DOB: 06-04-1945 DOA: 11/27/2021 PCP: [name], MD   Brief Narrative:  This 76 years old male with PMH significant for atrial fibrillation is s/p repeated ablations on Eliquis, prostate cancer, asthma, provoked DVT presented in the ED with cough, fever and shortness of breath.  Patient was treated for pneumonia last week with azithromycin, later switched to amoxicillin due to interaction with Eliquis.  He presented to the urgent care with altered mentation and was advised to go to the ED.  He reports having shortness of breath and nonproductive cough.  Today he woke up and was confused.  He was febrile on arrival with a temp of 102.4, remains on room air.  Chest x-ray showed right upper lobe infiltrate concerning for pneumonia. Patient is admitted for community-acquired pneumonia and started on IV antibiotics.  Assessment & Plan:   Principal Problem:   Community acquired pneumonia Active Problems:   Malignant neoplasm of prostate ([place])   PAF (paroxysmal atrial fibrillation) (HCC)   Hyponatremia   Hypokalemia   Acute metabolic encephalopathy  Community-acquired pneumonia: Patient presented with cough, fever and shortness of breath. Chest x-ray shows right upper lobe pneumonia. Continue IV Rocephin and doxycycline for 5 days Follow-up blood and urine culture.  Acute metabolic encephalopathy: Likely secondary to pneumonia and hyponatremia which has now resolved.  Hypokalemia: Replaced.  Continue to monitor.  Hyponatremia: Likely due to dehydration,  Continue IV fluids,  serum sodium improved.  Paroxysmal A-fib: Heart rate well controlled.   Continue Cardizem and flecainide Continue Eliquis Status post ablation x2 but remains symptomatic and has planned ablation next month  Malignant neoplasm of prostate: History of prostatectomy with recurrence s/p radiation and now under observation with [facility].   DVT  prophylaxis: Eliquis Code Status: Full code Family Communication: Wife at bed side Disposition Plan:   Status is: Inpatient Remains inpatient appropriate because: Admitted for community-acquired pneumonia and requiring IV antibiotics.    Consultants:  None  Procedures: None Antimicrobials: Ceftriaxone and Zithromax.  Subjective: Patient was seen and examined at bedside.  Overnight events noted.  Patient reports feeling much improved. He reported still having cough especially at night, asking for Hycodan to be prescribed.  Objective: Vitals:   11/28/21 0242 11/28/21 0627 11/28/21 0817 11/28/21 1341  BP: 130/62 119/64  122/72  Pulse: 60 61  63  Resp: '16 18  20  '$ Temp: 99 F (37.2 C) 99.8 F (37.7 C)  99 F (37.2 C)  TempSrc: Oral Oral    SpO2: 99% 99% 98% 100%  Weight:      Height:        Intake/Output Summary (Last 24 hours) at 11/28/2021 1346 Last data filed at 11/28/2021 0924 Gross per 24 hour  Intake 1880.71 ml  Output --  Net 1880.71 ml   Filed Weights   11/27/21 1351 11/27/21 1846  Weight: 98 kg 99 kg    Examination:  General exam: Appears comfortable, not in any acute distress. Respiratory system: CTA bilaterally, no wheezing, no crackles, normal respiratory effort. Cardiovascular system: S1 & S2 heard, regular rate and rhythm, no murmur Gastrointestinal system: Abdomen is soft, non tender, non distended, BS+ Central nervous system: Alert and oriented x 3. No focal neurological deficits. Extremities: No edema, no cyanosis, no clubbing Skin: No rashes, lesions or ulcers Psychiatry: Judgement and insight appear normal. Mood & affect appropriate.     Data Reviewed: I have personally reviewed following labs and imaging studies  CBC: Recent Labs  Lab 11/27/21 1359 11/28/21 0516  WBC 9.1 9.9  HGB 12.5* 11.8*  HCT 34.7* 34.1*  MCV 89.4 91.7  PLT 146* 595*   Basic Metabolic Panel: Recent Labs  Lab 11/27/21 1359 11/28/21 0516  NA 128* 133*  K  3.1* 3.4*  CL 95* 100  CO2 24 23  GLUCOSE 124* 125*  BUN 9 10  CREATININE 0.99 0.91  CALCIUM 8.9 8.6*   GFR: Estimated Creatinine Clearance: 78.8 mL/min (by C-G formula based on SCr of 0.91 mg/dL). Liver Function Tests: Recent Labs  Lab 11/27/21 1359  AST 48*  ALT 31  ALKPHOS 52  BILITOT 0.9  PROT 6.7  ALBUMIN 3.8   No results for input(s): "LIPASE", "AMYLASE" in the last 168 hours. No results for input(s): "AMMONIA" in the last 168 hours. Coagulation Profile: No results for input(s): "INR", "PROTIME" in the last 168 hours. Cardiac Enzymes: No results for input(s): "CKTOTAL", "CKMB", "CKMBINDEX", "TROPONINI" in the last 168 hours. BNP (last 3 results) No results for input(s): "PROBNP" in the last 8760 hours. HbA1C: No results for input(s): "HGBA1C" in the last 72 hours. CBG: Recent Labs  Lab 11/27/21 1357  GLUCAP 123*   Lipid Profile: No results for input(s): "CHOL", "HDL", "LDLCALC", "TRIG", "CHOLHDL", "LDLDIRECT" in the last 72 hours. Thyroid Function Tests: No results for input(s): "TSH", "T4TOTAL", "FREET4", "T3FREE", "THYROIDAB" in the last 72 hours. Anemia Panel: No results for input(s): "VITAMINB12", "FOLATE", "FERRITIN", "TIBC", "IRON", "RETICCTPCT" in the last 72 hours. Sepsis Labs: No results for input(s): "PROCALCITON", "LATICACIDVEN" in the last 168 hours.  Recent Results (from the past 240 hour(s))  Resp Panel by RT-PCR (Flu A&B, Covid) Anterior Nasal Swab     Status: None   Collection Time: 11/27/21  1:59 PM   Specimen: Anterior Nasal Swab  Result Value Ref Range Status   SARS Coronavirus 2 by RT PCR NEGATIVE NEGATIVE Final    Comment: (NOTE) SARS-CoV-2 target nucleic acids are NOT DETECTED.  The SARS-CoV-2 RNA is generally detectable in upper respiratory specimens during the acute phase of infection. The lowest concentration of SARS-CoV-2 viral copies this assay can detect is 138 copies/mL. A negative result does not preclude  SARS-Cov-2 infection and should not be used as the sole basis for treatment or other patient management decisions. A negative result may occur with  improper specimen collection/handling, submission of specimen other than nasopharyngeal swab, presence of viral mutation(s) within the areas targeted by this assay, and inadequate number of viral copies(<138 copies/mL). A negative result must be combined with clinical observations, patient history, and epidemiological information. The expected result is Negative.  Fact Sheet for Patients:  EntrepreneurPulse.com.au  Fact Sheet for Healthcare Providers:  IncredibleEmployment.be  This test is no t yet approved or cleared by the [place] FDA and  has been authorized for detection and/or diagnosis of SARS-CoV-2 by FDA under an Emergency Use Authorization (EUA). This EUA will remain  in effect (meaning this test can be used) for the duration of the COVID-19 declaration under Section 564(b)(1) of the Act, 21 U.S.C.section 360bbb-3(b)(1), unless the authorization is terminated  or revoked sooner.       Influenza A by PCR NEGATIVE NEGATIVE Final   Influenza B by PCR NEGATIVE NEGATIVE Final    Comment: (NOTE) The Xpert Xpress SARS-CoV-2/FLU/RSV plus assay is intended as an aid in the diagnosis of influenza from Nasopharyngeal swab specimens and should not be used as a sole basis for treatment. Nasal washings and aspirates are unacceptable  for Xpert Xpress SARS-CoV-2/FLU/RSV testing.  Fact Sheet for Patients: EntrepreneurPulse.com.au  Fact Sheet for Healthcare Providers: IncredibleEmployment.be  This test is not yet approved or cleared by the [place] FDA and has been authorized for detection and/or diagnosis of SARS-CoV-2 by FDA under an Emergency Use Authorization (EUA). This EUA will remain in effect (meaning this test can be used) for the duration of  the COVID-19 declaration under Section 564(b)(1) of the Act, 21 U.S.C. section 360bbb-3(b)(1), unless the authorization is terminated or revoked.  Performed at [facility], [address], [place], [place] [address]    Radiology Studies: DG Chest 2 View  Result Date: 11/27/2021 CLINICAL DATA:  Shortness of breath EXAM: CHEST - 2 VIEW COMPARISON:  02/17/2021 FINDINGS: Cardiac size is within normal limits. There are no signs of pulmonary edema. There is alveolar infiltrate in right upper lobe consistent with pneumonia. Rest of the lung fields are clear. There is no significant pleural effusion or pneumothorax. There is previous reverse arthroplasty left shoulder. Degenerative changes are noted in right AC joint. IMPRESSION: There is moderate sized alveolar infiltrate in right upper lung field consistent with right upper lobe pneumonia. Electronically Signed   By: Elmer Picker M.D.   On: 11/27/2021 14:43    Scheduled Meds:  acidophilus  1 capsule Oral QPM   apixaban  5 mg Oral BID   atorvastatin  20 mg Oral Daily   azelastine  1 spray Each Nare BID   budesonide  0.25 mg Nebulization BID   diltiazem  180 mg Oral Q1500   flecainide  150 mg Oral Q12H   fluticasone  1 spray Each Nare Daily   [START ON 11/29/2021] influenza vaccine adjuvanted  0.5 mL Intramuscular Tomorrow-1000   ipratropium  2 spray Each Nare TID   loratadine  10 mg Oral Daily   montelukast  10 mg Oral QHS   pantoprazole  40 mg Oral Daily   psyllium  1 packet Oral BID   Continuous Infusions:  sodium chloride 10 mL/hr (11/27/21 1744)   cefTRIAXone (ROCEPHIN)  IV     doxycycline (VIBRAMYCIN) IV Stopped (11/28/21 0703)   lactated ringers 75 mL/hr at 11/28/21 0945     LOS: 1 day    Time spent: 50 mins    Cindie Rajagopalan, MD [facility]   If 7PM-7AM, please contact night-coverage

## 2021-11-29 ENCOUNTER — Inpatient Hospital Stay (HOSPITAL_COMMUNITY): Payer: Medicare Other

## 2021-11-29 DIAGNOSIS — J189 Pneumonia, unspecified organism: Secondary | ICD-10-CM | POA: Diagnosis not present

## 2021-11-29 LAB — CBC
HCT: 32.8 % — ABNORMAL LOW (ref 39.0–52.0)
Hemoglobin: 11.2 g/dL — ABNORMAL LOW (ref 13.0–17.0)
MCH: 31.7 pg (ref 26.0–34.0)
MCHC: 34.1 g/dL (ref 30.0–36.0)
MCV: 92.9 fL (ref 80.0–100.0)
Platelets: 145 10*3/uL — ABNORMAL LOW (ref 150–400)
RBC: 3.53 MIL/uL — ABNORMAL LOW (ref 4.22–5.81)
RDW: 11.8 % (ref 11.5–15.5)
WBC: 8.8 10*3/uL (ref 4.0–10.5)
nRBC: 0 % (ref 0.0–0.2)

## 2021-11-29 LAB — BASIC METABOLIC PANEL
Anion gap: 9 (ref 5–15)
BUN: 11 mg/dL (ref 8–23)
CO2: 24 mmol/L (ref 22–32)
Calcium: 8.8 mg/dL — ABNORMAL LOW (ref 8.9–10.3)
Chloride: 100 mmol/L (ref 98–111)
Creatinine, Ser: 0.91 mg/dL (ref 0.61–1.24)
GFR, Estimated: >60 mL/min (ref >60)
Glucose, Bld: 114 mg/dL — ABNORMAL HIGH (ref 70–99)
Potassium: 3.4 mmol/L — ABNORMAL LOW (ref 3.5–5.1)
Sodium: 133 mmol/L — ABNORMAL LOW (ref 135–145)

## 2021-11-29 LAB — PHOSPHORUS: Phosphorus: 2.8 mg/dL (ref 2.5–4.6)

## 2021-11-29 LAB — MAGNESIUM: Magnesium: 2.1 mg/dL (ref 1.7–2.4)

## 2021-11-29 MED ORDER — FUROSEMIDE 10 MG/ML IJ SOLN
40.0000 mg | Freq: Once | INTRAMUSCULAR | Status: AC
  Administered 2021-11-29: 40 mg via INTRAVENOUS
  Filled 2021-11-29: qty 4

## 2021-11-29 MED ORDER — POTASSIUM CHLORIDE 20 MEQ PO PACK
40.0000 meq | PACK | Freq: Once | ORAL | Status: AC
  Administered 2021-11-29: 40 meq via ORAL
  Filled 2021-11-29: qty 2

## 2021-11-29 MED ORDER — MENTHOL 3 MG MT LOZG
1.0000 | LOZENGE | OROMUCOSAL | Status: DC | PRN
Start: 2021-11-29 — End: 2021-11-30
  Administered 2021-11-29 – 2021-11-30 (×2): 3 mg via ORAL
  Filled 2021-11-29 (×2): qty 9

## 2021-11-29 MED ORDER — DM-GUAIFENESIN ER 30-600 MG PO TB12
1.0000 | ORAL_TABLET | Freq: Two times a day (BID) | ORAL | Status: DC
  Administered 2021-11-29 – 2021-11-30 (×3): 1 via ORAL
  Filled 2021-11-29 (×3): qty 1

## 2021-11-29 MED ORDER — LIDOCAINE VISCOUS HCL 2 % MT SOLN
15.0000 mL | Freq: Once | OROMUCOSAL | Status: AC
  Administered 2021-11-29: 15 mL via OROMUCOSAL
  Filled 2021-11-29: qty 15

## 2021-11-29 NOTE — Telephone Encounter (Signed)
I called and discussed with the patient.  He is admitted to [facility] and is slowly improving.  Lisa-please make a follow-up appointment with me at 1:30 PM on the 21st with chest x-ray.  Thank you.

## 2021-11-29 NOTE — Progress Notes (Signed)
Mobility Specialist - Progress Note   11/29/21 0930  Mobility  Activity Ambulated with assistance in hallway  Level of Assistance Modified independent, requires aide device or extra time  Assistive Device None  Distance Ambulated (ft) 900 ft  Activity Response Tolerated well  $Mobility charge 1 Mobility   Pt received in chair and agreed for mobility. Felt lower back was stiff whilst ambulating, not normal for him. Pt returned to chair with all needs met and family in room.   Roderick Pee Mobility Specialist

## 2021-11-29 NOTE — Progress Notes (Signed)
  Transition of Care [facility]) Screening Note   Patient Details  Name: Brandon Simmons. Date of Birth: 07/02/1945   Transition of Care [facility]) CM/SW Contact:    Vassie Moselle, LCSW Phone Number: 11/29/2021, 11:12 AM    Transition of Care Department [facility]) has reviewed patient and no TOC needs have been identified at this time. We will continue to monitor patient advancement through interdisciplinary progression rounds. If new patient transition needs arise, please place a TOC consult.

## 2021-11-29 NOTE — Progress Notes (Signed)
PROGRESS NOTE    Brandon Simmons Section.  PYP:[id number] DOB: Jan 04, 1946 DOA: 11/27/2021 PCP: [name], MD   Brief Narrative:  This 76 years old male with PMH significant for atrial fibrillation s/p repeated ablations on Eliquis, prostate cancer, asthma, provoked DVT presented in the ED with cough, fever and shortness of breath.  Patient was treated for pneumonia last week with azithromycin, later switched to amoxicillin due to interaction with Eliquis.  He presented to the urgent care with altered mentation and was advised to go to the ED.  He reports having shortness of breath and nonproductive cough. He woke up and was confused on the day of admission.  He was febrile on arrival with a temp of 102.4, remains on room air.  Chest x-ray showed right upper lobe infiltrate concerning for pneumonia. Patient is admitted for community-acquired pneumonia and started on IV antibiotics.  Assessment & Plan:   Principal Problem:   Community acquired pneumonia Active Problems:   Malignant neoplasm of prostate ([place])   PAF (paroxysmal atrial fibrillation) (HCC)   Hyponatremia   Hypokalemia   Acute metabolic encephalopathy  Community-acquired pneumonia: Patient presented with cough, fever and shortness of breath. Chest x-ray showed right upper lobe pneumonia ( [place]). Continue IV Rocephin and doxycycline for 5 days Blood cultures no growth so far.  Acute metabolic encephalopathy: Likely secondary to pneumonia and hyponatremia which has now resolved. Patient back to baseline mental mental status  Hypokalemia: Replaced.  Continue to monitor.  Hyponatremia: Likely due to dehydration, Serum sodium improved.  Paroxysmal A-fib: Heart rate well controlled.   Continue Cardizem and flecainide. Continue Eliquis Status post ablation x2 but remains symptomatic and has planned ablation next month  Malignant neoplasm of prostate: History of prostatectomy with recurrence s/p radiation and  now under observation with [facility].  Cough: Cough remains persistent likely secondary to pneumonia. Continue Robitussin, added hycodan for cough control    DVT prophylaxis: Eliquis Code Status: Full code Family Communication: Wife at bed side Disposition Plan:   Status is: Inpatient Remains inpatient appropriate because: Admitted for community-acquired pneumonia and requiring IV antibiotics.   Anticipated discharge home 11/30/2021  Consultants:  None  Procedures: None Antimicrobials: Ceftriaxone and Zithromax.  Subjective: Patient was seen and examined at bedside.  Overnight events noted.   Patient reports slightly better, states cough is much better with Hycodan but has developed hoarseness. Denies any chest pain.  Slept well last night   Objective: Vitals:   11/28/21 2116 11/28/21 2204 11/29/21 0515 11/29/21 0848  BP: (!) 144/68  (!) 117/97   Pulse: 63  67   Resp: 19  18   Temp: 100.1 F (37.8 C) 99.6 F (37.6 C) 99.5 F (37.5 C)   TempSrc:  Oral    SpO2: 100%  100% 99%  Weight:      Height:        Intake/Output Summary (Last 24 hours) at 11/29/2021 1138 Last data filed at 11/29/2021 1047 Gross per 24 hour  Intake 2338.7 ml  Output --  Net 2338.7 ml   Filed Weights   11/27/21 1351 11/27/21 1846  Weight: 98 kg 99 kg    Examination:  General exam: Appears comfortable, not in any acute distress. Respiratory system: CTA bilaterally, respiratory effort normal, no accessory muscle use. Cardiovascular system: S1 & S2 heard, regular rate and rhythm, no murmur Gastrointestinal system: Abdomen is soft, non tender, non distended, BS+ Central nervous system: Alert and oriented x 3. No focal neurological deficits. Extremities:  No edema, no cyanosis, no clubbing Skin: No rashes, lesions or ulcers Psychiatry: Judgement and insight appear normal. Mood & affect appropriate.     Data Reviewed: I have personally reviewed following labs and imaging  studies  CBC: Recent Labs  Lab 11/27/21 1359 11/28/21 0516 11/29/21 0519  WBC 9.1 9.9 8.8  HGB 12.5* 11.8* 11.2*  HCT 34.7* 34.1* 32.8*  MCV 89.4 91.7 92.9  PLT 146* 141* 619*   Basic Metabolic Panel: Recent Labs  Lab 11/27/21 1359 11/28/21 0516 11/29/21 0519  NA 128* 133* 133*  K 3.1* 3.4* 3.4*  CL 95* 100 100  CO2 '24 23 24  '$ GLUCOSE 124* 125* 114*  BUN '9 10 11  '$ CREATININE 0.99 0.91 0.91  CALCIUM 8.9 8.6* 8.8*  MG  --   --  2.1  PHOS  --   --  2.8   GFR: Estimated Creatinine Clearance: 78.8 mL/min (by C-G formula based on SCr of 0.91 mg/dL). Liver Function Tests: Recent Labs  Lab 11/27/21 1359  AST 48*  ALT 31  ALKPHOS 52  BILITOT 0.9  PROT 6.7  ALBUMIN 3.8   No results for input(s): "LIPASE", "AMYLASE" in the last 168 hours. No results for input(s): "AMMONIA" in the last 168 hours. Coagulation Profile: No results for input(s): "INR", "PROTIME" in the last 168 hours. Cardiac Enzymes: No results for input(s): "CKTOTAL", "CKMB", "CKMBINDEX", "TROPONINI" in the last 168 hours. BNP (last 3 results) No results for input(s): "PROBNP" in the last 8760 hours. HbA1C: No results for input(s): "HGBA1C" in the last 72 hours. CBG: Recent Labs  Lab 11/27/21 1357  GLUCAP 123*   Lipid Profile: No results for input(s): "CHOL", "HDL", "LDLCALC", "TRIG", "CHOLHDL", "LDLDIRECT" in the last 72 hours. Thyroid Function Tests: No results for input(s): "TSH", "T4TOTAL", "FREET4", "T3FREE", "THYROIDAB" in the last 72 hours. Anemia Panel: No results for input(s): "VITAMINB12", "FOLATE", "FERRITIN", "TIBC", "IRON", "RETICCTPCT" in the last 72 hours. Sepsis Labs: No results for input(s): "PROCALCITON", "LATICACIDVEN" in the last 168 hours.  Recent Results (from the past 240 hour(s))  Resp Panel by RT-PCR (Flu A&B, Covid) Anterior Nasal Swab     Status: None   Collection Time: 11/27/21  1:59 PM   Specimen: Anterior Nasal Swab  Result Value Ref Range Status   SARS  Coronavirus 2 by RT PCR NEGATIVE NEGATIVE Final    Comment: (NOTE) SARS-CoV-2 target nucleic acids are NOT DETECTED.  The SARS-CoV-2 RNA is generally detectable in upper respiratory specimens during the acute phase of infection. The lowest concentration of SARS-CoV-2 viral copies this assay can detect is 138 copies/mL. A negative result does not preclude SARS-Cov-2 infection and should not be used as the sole basis for treatment or other patient management decisions. A negative result may occur with  improper specimen collection/handling, submission of specimen other than nasopharyngeal swab, presence of viral mutation(s) within the areas targeted by this assay, and inadequate number of viral copies(<138 copies/mL). A negative result must be combined with clinical observations, patient history, and epidemiological information. The expected result is Negative.  Fact Sheet for Patients:  EntrepreneurPulse.com.au  Fact Sheet for Healthcare Providers:  IncredibleEmployment.be  This test is no t yet approved or cleared by the [place] FDA and  has been authorized for detection and/or diagnosis of SARS-CoV-2 by FDA under an Emergency Use Authorization (EUA). This EUA will remain  in effect (meaning this test can be used) for the duration of the COVID-19 declaration under Section 564(b)(1) of the Act, 21 U.S.C.section 360bbb-3(b)(1),  unless the authorization is terminated  or revoked sooner.       Influenza A by PCR NEGATIVE NEGATIVE Final   Influenza B by PCR NEGATIVE NEGATIVE Final    Comment: (NOTE) The Xpert Xpress SARS-CoV-2/FLU/RSV plus assay is intended as an aid in the diagnosis of influenza from Nasopharyngeal swab specimens and should not be used as a sole basis for treatment. Nasal washings and aspirates are unacceptable for Xpert Xpress SARS-CoV-2/FLU/RSV testing.  Fact Sheet for  Patients: EntrepreneurPulse.com.au  Fact Sheet for Healthcare Providers: IncredibleEmployment.be  This test is not yet approved or cleared by the [place] FDA and has been authorized for detection and/or diagnosis of SARS-CoV-2 by FDA under an Emergency Use Authorization (EUA). This EUA will remain in effect (meaning this test can be used) for the duration of the COVID-19 declaration under Section 564(b)(1) of the Act, 21 U.S.C. section 360bbb-3(b)(1), unless the authorization is terminated or revoked.  Performed at [facility], [address] [place], [place] [address]   Culture, blood (Routine X 2) w Reflex to ID Panel     Status: None (Preliminary result)   Collection Time: 11/28/21  5:16 AM   Specimen: BLOOD  Result Value Ref Range Status   Specimen Description   Final    BLOOD Performed at [facility] [address] [place], [place] [address]    Special Requests   Final    BOTTLES DRAWN AEROBIC AND ANAEROBIC BCAV Performed at [facility], [place] [address], [place], [place] [address]    Culture   Final    NO GROWTH < 24 HOURS Performed at [facility] [address] [place], [place] [address]    Report Status PENDING  Incomplete  Culture, blood (Routine X 2) w Reflex to ID Panel     Status: None (Preliminary result)   Collection Time: 11/28/21  5:25 AM   Specimen: BLOOD  Result Value Ref Range Status   Specimen Description   Final    BLOOD BLLA Performed at [facility] [address] [place], [place] [address]    Special Requests   Final    BOTTLES DRAWN AEROBIC AND ANAEROBIC BCAV Performed at [facility], [place] [address], [place], [place] [address]    Culture   Final    NO GROWTH < 24 HOURS Performed at [facility] [address] [place],  [address]    Report Status PENDING  Incomplete    Radiology Studies: DG Chest 2 View  Result Date: 11/27/2021 CLINICAL DATA:  Shortness of breath EXAM: CHEST - 2 VIEW COMPARISON:  02/17/2021 FINDINGS: Cardiac size is within normal limits. There are no signs of pulmonary edema. There is alveolar infiltrate in right upper lobe consistent with pneumonia. Rest of the lung fields are clear. There is no significant pleural effusion or pneumothorax. There is previous reverse arthroplasty left shoulder. Degenerative changes are noted in right AC joint. IMPRESSION: There is moderate sized alveolar infiltrate in right upper lung field consistent with right upper lobe pneumonia. Electronically Signed   By: Elmer Picker M.D.   On: 11/27/2021 14:43    Scheduled Meds:  acidophilus  1 capsule Oral QPM   apixaban  5 mg Oral BID   atorvastatin  20 mg Oral Daily   azelastine  1 spray Each Nare BID   budesonide  0.25 mg Nebulization BID   dextromethorphan-guaiFENesin  1 tablet Oral BID   diltiazem  180 mg Oral Q1500   flecainide  150 mg Oral Q12H   fluticasone  1 spray Each Nare Daily   influenza vaccine adjuvanted  0.5 mL Intramuscular Tomorrow-1000   ipratropium  2 spray Each Nare TID   loratadine  10 mg Oral Daily   montelukast  10 mg Oral QHS   pantoprazole  40 mg Oral Daily   psyllium  1 packet Oral BID   Continuous Infusions:  sodium chloride 10 mL/hr (11/27/21 1744)   cefTRIAXone (ROCEPHIN)  IV Stopped (11/28/21 1833)   doxycycline (VIBRAMYCIN) IV Stopped (11/29/21 0721)   lactated ringers Stopped (11/29/21 0518)     LOS: 2 days    Time spent: 35 mins    Juanmanuel Marohl, MD [facility]   If 7PM-7AM, please contact night-coverage

## 2021-11-30 DIAGNOSIS — J189 Pneumonia, unspecified organism: Secondary | ICD-10-CM | POA: Diagnosis not present

## 2021-11-30 LAB — CBC
HCT: 31 % — ABNORMAL LOW (ref 39.0–52.0)
Hemoglobin: 10.6 g/dL — ABNORMAL LOW (ref 13.0–17.0)
MCH: 31.7 pg (ref 26.0–34.0)
MCHC: 34.2 g/dL (ref 30.0–36.0)
MCV: 92.8 fL (ref 80.0–100.0)
Platelets: 153 10*3/uL (ref 150–400)
RBC: 3.34 MIL/uL — ABNORMAL LOW (ref 4.22–5.81)
RDW: 11.8 % (ref 11.5–15.5)
WBC: 6.6 10*3/uL (ref 4.0–10.5)
nRBC: 0 % (ref 0.0–0.2)

## 2021-11-30 LAB — BASIC METABOLIC PANEL
Anion gap: 7 (ref 5–15)
BUN: 11 mg/dL (ref 8–23)
CO2: 25 mmol/L (ref 22–32)
Calcium: 8.4 mg/dL — ABNORMAL LOW (ref 8.9–10.3)
Chloride: 102 mmol/L (ref 98–111)
Creatinine, Ser: 0.81 mg/dL (ref 0.61–1.24)
GFR, Estimated: >60 mL/min (ref >60)
Glucose, Bld: 118 mg/dL — ABNORMAL HIGH (ref 70–99)
Potassium: 3 mmol/L — ABNORMAL LOW (ref 3.5–5.1)
Sodium: 134 mmol/L — ABNORMAL LOW (ref 135–145)

## 2021-11-30 MED ORDER — CEFDINIR 300 MG PO CAPS: 300.0000 mg | ORAL_CAPSULE | Freq: Two times a day (BID) | ORAL | 0 refills | Status: AC

## 2021-11-30 MED ORDER — DOXYCYCLINE HYCLATE 100 MG PO TABS: 100.0000 mg | ORAL_TABLET | Freq: Two times a day (BID) | ORAL | Status: DC

## 2021-11-30 MED ORDER — POTASSIUM CHLORIDE 20 MEQ PO PACK
40.0000 meq | PACK | Freq: Two times a day (BID) | ORAL | Status: DC
  Administered 2021-11-30: 40 meq via ORAL
  Filled 2021-11-30 (×2): qty 2

## 2021-11-30 MED ORDER — DM-GUAIFENESIN ER 30-600 MG PO TB12: 1.0000 | ORAL_TABLET | Freq: Two times a day (BID) | ORAL | 0 refills | Status: AC

## 2021-11-30 MED ORDER — FUROSEMIDE 40 MG PO TABS: 40.0000 mg | ORAL_TABLET | Freq: Every day | ORAL | 0 refills | Status: DC

## 2021-11-30 NOTE — Telephone Encounter (Signed)
Patient scheduled 12/08/21 at 1:30pm for hospital follow up.  CXR ordered.  Nothing further at this time.

## 2021-11-30 NOTE — Discharge Summary (Signed)
Physician Discharge Summary  Brandon Simmons Section. DZH:[id number] DOB: [birth year] DOA: 11/27/2021  PCP: Brandon Lopes, MD  Admit date: 11/27/2021  Discharge date: 11/30/2021  Admitted From: Home.  Disposition:  Home.  Recommendations for Outpatient Follow-up:  Follow up with PCP in 1-2 weeks. Please obtain BMP/CBC in one week. Advised to take Omnicef 300 mg twice daily for 3 more days to complete 5-day treatment for community-acquired pneumonia. Advised to take Lasix 40 mg daily for couple of days and then discontinue. Advised to follow-up with cardiology for  scheduled ablation for atrial fibrillation.  Home Health: None Equipment/Devices: None  Discharge Condition: Stable CODE STATUS:Full code Diet recommendation: Heart Healthy   Brief [facility] Course: This 76 years old male with PMH significant for atrial fibrillation s/p repeated ablations on Eliquis, prostate cancer, asthma, provoked DVT presented in the ED with cough, fever and shortness of breath.  Patient was treated for pneumonia last week with azithromycin, later switched to amoxicillin due to interaction with Eliquis.  He presented to the urgent care with altered mentation and was advised to go to the ED.  He reports having shortness of breath and nonproductive cough. He woke up and was confused on the day of admission.  He was febrile on arrival with a temp of 102.4, remains on room air.  Chest x-ray showed right upper lobe infiltrate concerning for pneumonia. Patient was admitted for community-acquired pneumonia and started on IV antibiotics.  Patient was slightly confused on arrival which has significantly improved during the course of hospitalization.  He was continued on ceftriaxone and doxycycline.  Patient has developed bilateral leg swelling, started on Lasix .  Chest x-ray shows improved infiltrate.  Patient also started on Hycodan cough medication which has significantly improved his cough.  Patient feels  better and wants to be discharged.  Patient is being discharged home on [name] for 3 more days.  Discharge Diagnoses:  Principal Problem:   Community acquired pneumonia Active Problems:   Malignant neoplasm of prostate ([name])   PAF (paroxysmal atrial fibrillation) (HCC)   Hyponatremia   Hypokalemia   Acute metabolic encephalopathy  Community-acquired pneumonia: Patient presented with cough, fever and shortness of breath. Chest x-ray showed right upper lobe pneumonia ( Infiltrate). Continue IV Rocephin and doxycycline for 5 days Blood cultures no growth so far. Repeat chest x-ray shows improving pneumonia. Antibiotics changed to Omnicef 300 mg twice daily for 3 more days. Patient will follow-up with PCP and repeat chest x-ray in 3 to 4 weeks.   Acute metabolic encephalopathy: > Resolved Likely secondary to pneumonia and hyponatremia which has now resolved. Patient back to baseline mental mental status   Hypokalemia: Replaced.  Resolved.   Hyponatremia:>  Improved Likely due to dehydration, Serum sodium improved.   Paroxysmal A-fib: Heart rate well controlled.   Continue Cardizem and flecainide. Continue Eliquis Status post ablation x2 but remains symptomatic and has planned ablation next month   Malignant neoplasm of prostate: History of prostatectomy with recurrence s/p radiation and now under observation with [facility].   Cough: > Improved Cough remains persistent likely secondary to pneumonia. Continue Robitussin, added hycodan for cough control.  Bilateral pedal edema: Continue Lasix 40 mg daily for couple of days and then discontinue.     Discharge Instructions  Discharge Instructions     Call MD for:  difficulty breathing, headache or visual disturbances   Complete by: As directed    Call MD for:  persistant dizziness or light-headedness   Complete  by: As directed    Call MD for:  persistant nausea and vomiting   Complete by: As directed    Diet  - low sodium heart healthy   Complete by: As directed    Diet Carb Modified   Complete by: As directed    Discharge instructions   Complete by: As directed    Advised to follow-up with primary care physician in 1 week. Advised to take Omnicef 300 mg twice daily for 3 more days to complete 6-day treatment for community-acquired pneumonia. Advised to take Lasix 40 mg daily for couple of days and then discontinue. Advised to follow-up with cardiology for a scheduled ablation for atrial fibrillation.   Increase activity slowly   Complete by: As directed       Allergies as of 11/30/2021   No Known Allergies      Medication List     STOP taking these medications    amoxicillin 500 MG capsule Commonly known as: AMOXIL   predniSONE 10 MG tablet Commonly known as: DELTASONE       TAKE these medications    acetaminophen 500 MG tablet Commonly known as: TYLENOL Take 500 mg by mouth 2 (two) times daily as needed.   Align 4 MG Caps Take 4 mg by mouth every evening.   Allegra Allergy 180 MG tablet Generic drug: fexofenadine Take 1 tablet by mouth daily.   apixaban 5 MG Tabs tablet Commonly known as: ELIQUIS Take 5 mg by mouth 2 (two) times daily.   Asmanex HFA 100 MCG/ACT Aero Generic drug: Mometasone Furoate Inhale 1 puff into the lungs every evening.   atorvastatin 20 MG tablet Commonly known as: LIPITOR Take 20 mg by mouth daily.   Azelastine HCl 137 MCG/SPRAY Soln Inhale 1 spray into the lungs in the morning and at bedtime.   cefdinir 300 MG capsule Commonly known as: OMNICEF Take 1 capsule (300 mg total) by mouth 2 (two) times daily for 3 days.   dextromethorphan-guaiFENesin 30-600 MG 12hr tablet Commonly known as: MUCINEX DM Take 1 tablet by mouth 2 (two) times daily for 6 days.   diltiazem 180 MG 24 hr capsule Commonly known as: CARDIZEM CD TAKE 1 CAPSULE (180 MG TOTAL) BY MOUTH DAILY IN THE AFTERNOON.   diltiazem 30 MG tablet Commonly known as:  CARDIZEM TAKE 1 TABLET EVERY 4 HOURS AS NEEDED FOR AFIB RAPID HEART RATE OVER 100   EPINEPHrine 0.3 mg/0.3 mL Soaj injection Commonly known as: EPI-PEN Inject 0.3 mg into the muscle as needed for anaphylaxis.   flecainide 100 MG tablet Commonly known as: TAMBOCOR TAKE 1.5 TABLETS BY MOUTH 2 TIMES DAILY.   fluticasone 50 MCG/ACT nasal spray Commonly known as: FLONASE Place 1 spray into both nostrils daily.   furosemide 40 MG tablet Commonly known as: Lasix Take 1 tablet (40 mg total) by mouth daily.   GLUCOSAMINE-CHONDROITIN PO Take 1,500 mg by mouth daily.   ipratropium 0.06 % nasal spray Commonly known as: ATROVENT Place 2 sprays into both nostrils 3 (three) times daily.   montelukast 10 MG tablet Commonly known as: SINGULAIR Take 10 mg by mouth at bedtime.   NON FORMULARY Allergy Shots every 3 weeks   omeprazole 20 MG capsule Commonly known as: PRILOSEC Take 20 mg by mouth 2 (two) times daily before a meal.   Opcon-A 0.027-0.315 % Soln Generic drug: Naphazoline-Pheniramine Place 1 drop into both eyes 2 (two) times daily as needed (eye allergies (red/irritated eyes)).   psyllium 58.6 % powder  Commonly known as: METAMUCIL Take 1 packet by mouth 2 (two) times daily.   Ventolin HFA 108 (90 Base) MCG/ACT inhaler Generic drug: albuterol Inhale 1-2 puffs into the lungs every 6 (six) hours as needed for wheezing or shortness of breath.        Follow-up Information     Brandon Lopes, MD Follow up in 1 week(s).   Specialty: Internal Medicine Contact information: [place] [address] [phone]         Minus Breeding, MD .   Specialty: Cardiology Contact information: [address] [place] [address] [place] [address] [phone]         Constance Haw, MD .   Specialty: Cardiology Contact information: [address] [place] [address] [place] [address] [phone]                No Known  Allergies  Consultations: None   Procedures/Studies: DG CHEST PORT 1 VIEW  Result Date: 11/29/2021 CLINICAL DATA:  Pneumonia. EXAM: PORTABLE CHEST 1 VIEW COMPARISON:  Radiographs 11/27/2021 and 02/17/2021.  CT 08/26/2021. FINDINGS: 1527 hours. Two views obtained. Right upper lobe airspace disease appears slightly less dense on the 1st image. On the 2nd image, there are lower lung volumes with mild resulting bibasilar atelectasis. No definite progressive airspace disease, pleural effusion or pneumothorax. The heart size and mediastinal contours are stable. Previous left shoulder reverse arthroplasty. IMPRESSION: Right upper lobe airspace disease appears slightly less dense than on examination done 2 days ago. No definite new findings. Followup PA and lateral chest X-ray is recommended in 3-4 weeks following appropriate therapy to ensure resolution and exclude underlying malignancy. Electronically Signed   By: Richardean Sale M.D.   On: 11/29/2021 16:16   DG Chest 2 View  Result Date: 11/27/2021 CLINICAL DATA:  Shortness of breath EXAM: CHEST - 2 VIEW COMPARISON:  02/17/2021 FINDINGS: Cardiac size is within normal limits. There are no signs of pulmonary edema. There is alveolar infiltrate in right upper lobe consistent with pneumonia. Rest of the lung fields are clear. There is no significant pleural effusion or pneumothorax. There is previous reverse arthroplasty left shoulder. Degenerative changes are noted in right AC joint. IMPRESSION: There is moderate sized alveolar infiltrate in right upper lung field consistent with right upper lobe pneumonia. Electronically Signed   By: Elmer Picker M.D.   On: 11/27/2021 14:43     Subjective: Patient was seen and examined at bedside.  Overnight events noted.   Patient reports feeling much improved,  he has ambulated without any difficulty breathing and wants to be discharged.  Discharge Exam: Vitals:   11/30/21 0405 11/30/21 0411  BP:  137/64   Pulse:  (!) 59  Resp:  20  Temp: 98.7 F (37.1 C) 98.4 F (36.9 C)  SpO2:  96%   Vitals:   11/29/21 1407 11/29/21 2033 11/30/21 0405 11/30/21 0411  BP: 132/84 (!) 141/72  137/64  Pulse: 62 63  (!) 59  Resp: '18 18  20  '$ Temp: 98.7 F (37.1 C) 99.5 F (37.5 C) 98.7 F (37.1 C) 98.4 F (36.9 C)  TempSrc: Oral Oral Oral   SpO2: 100% 100%  96%  Weight:      Height:        General: Pt is alert, awake, not in acute distress Cardiovascular: RRR, S1/S2 +, no rubs, no gallops Respiratory: CTA bilaterally, no wheezing, no rhonchi Abdominal: Soft, NT, ND, bowel sounds + Extremities: Edema+ no cyanosis, no clubbing  The results of significant diagnostics from this hospitalization (including imaging, microbiology, ancillary and laboratory) are listed below for reference.     Microbiology: Recent Results (from the past 240 hour(s))  Resp Panel by RT-PCR (Flu A&B, Covid) Anterior Nasal Swab     Status: None   Collection Time: 11/27/21  1:59 PM   Specimen: Anterior Nasal Swab  Result Value Ref Range Status   SARS Coronavirus 2 by RT PCR NEGATIVE NEGATIVE Final    Comment: (NOTE) SARS-CoV-2 target nucleic acids are NOT DETECTED.  The SARS-CoV-2 RNA is generally detectable in upper respiratory specimens during the acute phase of infection. The lowest concentration of SARS-CoV-2 viral copies this assay can detect is 138 copies/mL. A negative result does not preclude SARS-Cov-2 infection and should not be used as the sole basis for treatment or other patient management decisions. A negative result may occur with  improper specimen collection/handling, submission of specimen other than nasopharyngeal swab, presence of viral mutation(s) within the areas targeted by this assay, and inadequate number of viral copies(<138 copies/mL). A negative result must be combined with clinical observations, patient history, and epidemiological information. The expected result is Negative.  Fact  Sheet for Patients:  EntrepreneurPulse.com.au  Fact Sheet for Healthcare Providers:  IncredibleEmployment.be  This test is no t yet approved or cleared by the [place] FDA and  has been authorized for detection and/or diagnosis of SARS-CoV-2 by FDA under an Emergency Use Authorization (EUA). This EUA will remain  in effect (meaning this test can be used) for the duration of the COVID-19 declaration under Section 564(b)(1) of the Act, 21 U.S.C.section 360bbb-3(b)(1), unless the authorization is terminated  or revoked sooner.       Influenza A by PCR NEGATIVE NEGATIVE Final   Influenza B by PCR NEGATIVE NEGATIVE Final    Comment: (NOTE) The Xpert Xpress SARS-CoV-2/FLU/RSV plus assay is intended as an aid in the diagnosis of influenza from Nasopharyngeal swab specimens and should not be used as a sole basis for treatment. Nasal washings and aspirates are unacceptable for Xpert Xpress SARS-CoV-2/FLU/RSV testing.  Fact Sheet for Patients: EntrepreneurPulse.com.au  Fact Sheet for Healthcare Providers: IncredibleEmployment.be  This test is not yet approved or cleared by the [place] FDA and has been authorized for detection and/or diagnosis of SARS-CoV-2 by FDA under an Emergency Use Authorization (EUA). This EUA will remain in effect (meaning this test can be used) for the duration of the COVID-19 declaration under Section 564(b)(1) of the Act, 21 U.S.C. section 360bbb-3(b)(1), unless the authorization is terminated or revoked.  Performed at [facility], [address], [place], [place] [address]   Culture, blood (Routine X 2) w Reflex to ID Panel     Status: None (Preliminary result)   Collection Time: 11/28/21  5:16 AM   Specimen: BLOOD  Result Value Ref Range Status   Specimen Description   Final    BLOOD Performed at [facility] [address],  [place], [place] [address]    Special Requests   Final    BOTTLES DRAWN AEROBIC AND ANAEROBIC BCAV Performed at [facility], [place] [address] [place], [place] [address]    Culture   Final    NO GROWTH 2 DAYS Performed at [facility] [address] [place], [place] [address]    Report Status PENDING  Incomplete  Culture, blood (Routine X 2) w Reflex to ID Panel     Status: None (Preliminary result)  Collection Time: 11/28/21  5:25 AM   Specimen: BLOOD  Result Value Ref Range Status   Specimen Description   Final    BLOOD BLLA Performed at [facility] [address], [place], [place] [address]    Special Requests   Final    BOTTLES DRAWN AEROBIC AND ANAEROBIC BCAV Performed at [facility], [place] [address], [place], [place] [address]    Culture   Final    NO GROWTH 2 DAYS Performed at [facility] [address] [place], [place] [address]    Report Status PENDING  Incomplete     Labs: BNP (last 3 results) No results for input(s): "BNP" in the last 8760 hours. Basic Metabolic Panel: Recent Labs  Lab 11/27/21 1359 11/28/21 0516 11/29/21 0519 11/30/21 0520  NA 128* 133* 133* 134*  K 3.1* 3.4* 3.4* 3.0*  CL 95* 100 100 102  CO2 '24 23 24 25  '$ GLUCOSE 124* 125* 114* 118*  BUN '9 10 11 11  '$ CREATININE 0.99 0.91 0.91 0.81  CALCIUM 8.9 8.6* 8.8* 8.4*  MG  --   --  2.1  --   PHOS  --   --  2.8  --    Liver Function Tests: Recent Labs  Lab 11/27/21 1359  AST 48*  ALT 31  ALKPHOS 52  BILITOT 0.9  PROT 6.7  ALBUMIN 3.8   No results for input(s): "LIPASE", "AMYLASE" in the last 168 hours. No results for input(s): "AMMONIA" in the last 168 hours. CBC: Recent Labs  Lab 11/27/21 1359 11/28/21 0516 11/29/21 0519 11/30/21 0520  WBC 9.1 9.9 8.8 6.6  HGB 12.5* 11.8* 11.2* 10.6*  HCT 34.7* 34.1* 32.8* 31.0*  MCV 89.4 91.7 92.9 92.8  PLT 146* 141* 145* 153   Cardiac Enzymes: No results for  input(s): "CKTOTAL", "CKMB", "CKMBINDEX", "TROPONINI" in the last 168 hours. BNP: Invalid input(s): "POCBNP" CBG: Recent Labs  Lab 11/27/21 1357  GLUCAP 123*   D-Dimer No results for input(s): "DDIMER" in the last 72 hours. Hgb A1c No results for input(s): "HGBA1C" in the last 72 hours. Lipid Profile No results for input(s): "CHOL", "HDL", "LDLCALC", "TRIG", "CHOLHDL", "LDLDIRECT" in the last 72 hours. Thyroid function studies No results for input(s): "TSH", "T4TOTAL", "T3FREE", "THYROIDAB" in the last 72 hours.  Invalid input(s): "FREET3" Anemia work up No results for input(s): "VITAMINB12", "FOLATE", "FERRITIN", "TIBC", "IRON", "RETICCTPCT" in the last 72 hours. Urinalysis    Component Value Date/Time   COLORURINE YELLOW 07/09/2020 0115   APPEARANCEUR CLEAR 07/09/2020 0115   LABSPEC 1.025 07/09/2020 0115   PHURINE 5.5 07/09/2020 0115   GLUCOSEU NEGATIVE 07/09/2020 0115   HGBUR NEGATIVE 07/09/2020 0115   BILIRUBINUR NEGATIVE 07/09/2020 0115   KETONESUR NEGATIVE 07/09/2020 0115   PROTEINUR TRACE (A) 07/09/2020 0115   NITRITE NEGATIVE 07/09/2020 0115   LEUKOCYTESUR NEGATIVE 07/09/2020 0115   Sepsis Labs Recent Labs  Lab 11/27/21 1359 11/28/21 0516 11/29/21 0519 11/30/21 0520  WBC 9.1 9.9 8.8 6.6   Microbiology Recent Results (from the past 240 hour(s))  Resp Panel by RT-PCR (Flu A&B, Covid) Anterior Nasal Swab     Status: None   Collection Time: 11/27/21  1:59 PM   Specimen: Anterior Nasal Swab  Result Value Ref Range Status   SARS Coronavirus 2 by RT PCR NEGATIVE NEGATIVE Final    Comment: (NOTE) SARS-CoV-2 target nucleic acids are NOT DETECTED.  The SARS-CoV-2 RNA is generally detectable in upper respiratory specimens during the acute phase  of infection. The lowest concentration of SARS-CoV-2 viral copies this assay can detect is 138 copies/mL. A negative result does not preclude SARS-Cov-2 infection and should not be used as the sole basis for treatment  or other patient management decisions. A negative result may occur with  improper specimen collection/handling, submission of specimen other than nasopharyngeal swab, presence of viral mutation(s) within the areas targeted by this assay, and inadequate number of viral copies(<138 copies/mL). A negative result must be combined with clinical observations, patient history, and epidemiological information. The expected result is Negative.  Fact Sheet for Patients:  EntrepreneurPulse.com.au  Fact Sheet for Healthcare Providers:  IncredibleEmployment.be  This test is no t yet approved or cleared by the [place] FDA and  has been authorized for detection and/or diagnosis of SARS-CoV-2 by FDA under an Emergency Use Authorization (EUA). This EUA will remain  in effect (meaning this test can be used) for the duration of the COVID-19 declaration under Section 564(b)(1) of the Act, 21 U.S.C.section 360bbb-3(b)(1), unless the authorization is terminated  or revoked sooner.       Influenza A by PCR NEGATIVE NEGATIVE Final   Influenza B by PCR NEGATIVE NEGATIVE Final    Comment: (NOTE) The Xpert Xpress SARS-CoV-2/FLU/RSV plus assay is intended as an aid in the diagnosis of influenza from Nasopharyngeal swab specimens and should not be used as a sole basis for treatment. Nasal washings and aspirates are unacceptable for Xpert Xpress SARS-CoV-2/FLU/RSV testing.  Fact Sheet for Patients: EntrepreneurPulse.com.au  Fact Sheet for Healthcare Providers: IncredibleEmployment.be  This test is not yet approved or cleared by the [place] FDA and has been authorized for detection and/or diagnosis of SARS-CoV-2 by FDA under an Emergency Use Authorization (EUA). This EUA will remain in effect (meaning this test can be used) for the duration of the COVID-19 declaration under Section 564(b)(1) of the Act, 21 U.S.C. section  360bbb-3(b)(1), unless the authorization is terminated or revoked.  Performed at [facility], [address], [place], [place] [address]   Culture, blood (Routine X 2) w Reflex to ID Panel     Status: None (Preliminary result)   Collection Time: 11/28/21  5:16 AM   Specimen: BLOOD  Result Value Ref Range Status   Specimen Description   Final    BLOOD Performed at [facility] [address], [place], [place] [address]    Special Requests   Final    BOTTLES DRAWN AEROBIC AND ANAEROBIC BCAV Performed at [facility], [place] [address], [place], [place] [address]    Culture   Final    NO GROWTH 2 DAYS Performed at [facility] [address], [place], [place] [address]    Report Status PENDING  Incomplete  Culture, blood (Routine X 2) w Reflex to ID Panel     Status: None (Preliminary result)   Collection Time: 11/28/21  5:25 AM   Specimen: BLOOD  Result Value Ref Range Status   Specimen Description   Final    BLOOD BLLA Performed at [facility] [address] [place], [place] [address]    Special Requests   Final    BOTTLES DRAWN AEROBIC AND ANAEROBIC BCAV Performed at [facility], [place] [address], [place], [place] [address]    Culture   Final    NO GROWTH 2 DAYS Performed at [facility] [address], [place],  [address]    Report Status PENDING  Incomplete  Time coordinating discharge: Over 30 minutes  SIGNED:   Shawna Clamp, MD  [facility] 11/30/2021, 3:05 PM Pager   If 7PM-7AM, please contact night-coverage

## 2021-11-30 NOTE — Discharge Instructions (Signed)
Advised to follow-up with primary care physician in 1 week. Advised to take Omnicef 300 mg twice daily for 3 more days to complete 5-day treatment for community-acquired pneumonia. Advised to take Lasix 40 mg daily for couple of days and then discontinue. Advised to follow-up with cardiology for a scheduled ablation for atrial fibrillation.

## 2021-11-30 NOTE — Progress Notes (Signed)
Mobility Specialist - Progress Note   11/30/21 1011  Mobility  Activity Ambulated with assistance in hallway  Level of Assistance Independent after set-up  Assistive Device None  Distance Ambulated (ft) 500 ft  Activity Response Tolerated well  $Mobility charge 1 Mobility   Pt received in chair and agreed for mobility. No c/o pain nor discomfort. Pt returned to chair after ambulating with all needs met and family in room.   Chase Moore Mobility Specialist   

## 2021-12-01 ENCOUNTER — Other Ambulatory Visit: Payer: Medicare Other

## 2021-12-02 DIAGNOSIS — C61 Malignant neoplasm of prostate: Secondary | ICD-10-CM | POA: Diagnosis not present

## 2021-12-03 LAB — CULTURE, BLOOD (ROUTINE X 2)
Culture: NO GROWTH
Culture: NO GROWTH

## 2021-12-06 ENCOUNTER — Ambulatory Visit: Payer: Medicare Other | Attending: Cardiology

## 2021-12-06 DIAGNOSIS — I4819 Other persistent atrial fibrillation: Secondary | ICD-10-CM | POA: Insufficient documentation

## 2021-12-06 DIAGNOSIS — Z01812 Encounter for preprocedural laboratory examination: Secondary | ICD-10-CM | POA: Insufficient documentation

## 2021-12-06 LAB — BASIC METABOLIC PANEL
BUN/Creatinine Ratio: 11 (ref 10–24)
BUN: 11 mg/dL (ref 8–27)
CO2: 27 mmol/L (ref 20–29)
Calcium: 9 mg/dL (ref 8.6–10.2)
Chloride: 103 mmol/L (ref 96–106)
Creatinine, Ser: 1.03 mg/dL (ref 0.76–1.27)
Glucose: 91 mg/dL (ref 70–99)
Potassium: 4.3 mmol/L (ref 3.5–5.2)
Sodium: 137 mmol/L (ref 134–144)
eGFR: 75 mL/min/{1.73_m2} (ref >59)

## 2021-12-06 LAB — CBC
Hematocrit: 35.2 % — ABNORMAL LOW (ref 37.5–51.0)
Hemoglobin: 11.9 g/dL — ABNORMAL LOW (ref 13.0–17.7)
MCH: 31.2 pg (ref 26.6–33.0)
MCHC: 33.8 g/dL (ref 31.5–35.7)
MCV: 92 fL (ref 79–97)
Platelets: 344 10*3/uL (ref 150–450)
RBC: 3.82 x10E6/uL — ABNORMAL LOW (ref 4.14–5.80)
RDW: 12.6 % (ref 11.6–15.4)
WBC: 5.8 10*3/uL (ref 3.4–10.8)

## 2021-12-07 ENCOUNTER — Telehealth (HOSPITAL_COMMUNITY): Payer: Self-pay | Admitting: Emergency Medicine

## 2021-12-07 NOTE — Telephone Encounter (Signed)
Reaching out to patient to offer assistance regarding upcoming cardiac imaging study; pt verbalizes understanding of appt date/time, parking situation and where to check in, pre-test NPO status and medications ordered, and verified current allergies; name and call back number provided for further questions should they arise Marchia Bond RN Navigator Cardiac Imaging [facility] [phone] office [phone] cell  Arrival [address]  Denies iv issues Daily meds, holding allergy meds, lasix,

## 2021-12-08 ENCOUNTER — Ambulatory Visit (INDEPENDENT_AMBULATORY_CARE_PROVIDER_SITE_OTHER): Payer: Medicare Other

## 2021-12-08 ENCOUNTER — Ambulatory Visit (INDEPENDENT_AMBULATORY_CARE_PROVIDER_SITE_OTHER): Payer: Medicare Other | Admitting: Pulmonary Disease

## 2021-12-08 ENCOUNTER — Ambulatory Visit (HOSPITAL_BASED_OUTPATIENT_CLINIC_OR_DEPARTMENT_OTHER)
Admission: RE | Admit: 2021-12-08 | Discharge: 2021-12-08 | Disposition: A | Discharge status: Home / Self Care | Payer: Medicare Other | Source: Ambulatory Visit | Attending: Cardiology | Admitting: Cardiology

## 2021-12-08 ENCOUNTER — Encounter (HOSPITAL_BASED_OUTPATIENT_CLINIC_OR_DEPARTMENT_OTHER): Payer: Self-pay

## 2021-12-08 ENCOUNTER — Encounter: Payer: Self-pay | Admitting: Pulmonary Disease

## 2021-12-08 ENCOUNTER — Telehealth: Payer: Self-pay | Admitting: Cardiology

## 2021-12-08 VITALS — BP 122/74 | HR 50 | Temp 98.1°F | Ht 70.0 in | Wt 212.0 lb

## 2021-12-08 DIAGNOSIS — I4819 Other persistent atrial fibrillation: Secondary | ICD-10-CM | POA: Diagnosis not present

## 2021-12-08 DIAGNOSIS — R051 Acute cough: Secondary | ICD-10-CM | POA: Diagnosis not present

## 2021-12-08 DIAGNOSIS — J189 Pneumonia, unspecified organism: Secondary | ICD-10-CM

## 2021-12-08 DIAGNOSIS — J4530 Mild persistent asthma, uncomplicated: Secondary | ICD-10-CM | POA: Diagnosis not present

## 2021-12-08 MED ORDER — IOHEXOL 350 MG/ML SOLN
100.0000 mL | Freq: Once | INTRAVENOUS | Status: AC | PRN
  Administered 2021-12-08: 80 mL via INTRAVENOUS

## 2021-12-08 MED ORDER — HYDROCODONE BIT-HOMATROP MBR 5-1.5 MG/5ML PO SOLN: 5.0000 mL | Freq: Four times a day (QID) | ORAL | 0 refills | Status: DC | PRN

## 2021-12-08 NOTE — Progress Notes (Addendum)
Brandon Simmons    [id number]    03-Feb-1946  Primary Care Physician:Paterson, Ermalene Searing, MD  Referring Physician: [name], MD [address] [place],  [place] [address]   Chief complaint: Follow-up for pneumonia, mild asthma  HPI: 76 year old with history of arrhythmias, asthma, prostate cancer, DVT Developed community-acquired pneumonia in mid April.  Symptoms started off with a upper respiratory viral infection and proceeded to cough, fevers of 102.  He was evaluated in the ED on 4/22 and given amoxicillin with no improvement.  He was reevaluated on 4/24 and doxycycline added.  He got a round of prednisone as well.  He was given breztri from his primary care doctor but had to stop after few days as he developed sore throat and hoarseness.  He then got levofloxacin and prednisone for 7 days.  Unfortunately developed atrial fibrillation transiently while on prednisone but converted back to sinus rhythm.  Overall improved with this treatment  History notable for mild asthma for which he sees Dr. Remus Blake at allergy center.  Treated with Asmanex inhaler, albuterol. He also had a DVT in 2019 in the setting of surgery and was treated with anticoagulants  Developed COVID-19 in October 2022 after a trip to [place].  Treated with Molnupiravir but has persistent cough since then.     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  No feather pillows or comforters Smoking history: Minimal smoking history in his 56s Travel history: No significant travel history Relevant family history: No family history of lung disease  Interim history: Admitted to [facility] in September 2013 for community-acquired pneumonia with right upper lobe and middle lobe infiltrate.  Treated with Rocephin and doxycycline with negative cultures.  Switch to [facility] on discharge for total of 7  days of therapy.  Noted to be encephalopathic with hyponatremia which resolved  Post discharge he is here for follow-up.  States that his cough is getting better every day but still has some persistent symptoms.  Outpatient Encounter Medications as of 12/08/2021  Medication Sig   acetaminophen (TYLENOL) 500 MG tablet Take 500 mg by mouth 2 (two) times daily as needed.   apixaban (ELIQUIS) 5 MG TABS tablet Take 5 mg by mouth 2 (two) times daily.    atorvastatin (LIPITOR) 20 MG tablet Take 20 mg by mouth daily.   Azelastine HCl 137 MCG/SPRAY SOLN Inhale 1 spray into the lungs in the morning and at bedtime.   diltiazem (CARDIZEM CD) 180 MG 24 hr capsule TAKE 1 CAPSULE (180 MG TOTAL) BY MOUTH DAILY IN THE AFTERNOON.   diltiazem (CARDIZEM) 30 MG tablet TAKE 1 TABLET EVERY 4 HOURS AS NEEDED FOR AFIB RAPID HEART RATE OVER 100   EPINEPHrine 0.3 mg/0.3 mL IJ SOAJ injection Inject 0.3 mg into the muscle as needed for anaphylaxis.   fexofenadine (ALLEGRA ALLERGY) 180 MG tablet Take 1 tablet by mouth daily.   flecainide (TAMBOCOR) 100 MG tablet TAKE 1.5 TABLETS BY MOUTH 2 TIMES DAILY.   fluticasone (FLONASE) 50 MCG/ACT nasal spray Place 1 spray into both nostrils daily.   furosemide (LASIX) 40 MG tablet Take 1 tablet (40 mg total) by mouth daily.   GLUCOSAMINE-CHONDROITIN PO Take 1,500 mg by mouth daily.   ipratropium (ATROVENT) 0.06 % nasal spray Place 2 sprays into both nostrils 3 (three) times daily.   Mometasone Furoate [facility]  HFA) 100 MCG/ACT AERO Inhale 1 puff into the lungs every evening.   montelukast (SINGULAIR) 10 MG tablet Take 10 mg by mouth at bedtime.   Naphazoline-Pheniramine (OPCON-A) 0.027-0.315 % SOLN Place 1 drop into both eyes 2 (two) times daily as needed (eye allergies (red/irritated eyes)).   NON FORMULARY Allergy Shots every 3 weeks   omeprazole (PRILOSEC) 20 MG capsule Take 20 mg by mouth 2 (two) times daily before a meal.    Probiotic Product (ALIGN) 4 MG CAPS Take 4 mg by  mouth every evening.   psyllium (METAMUCIL) 58.6 % powder Take 1 packet by mouth 2 (two) times daily.   VENTOLIN HFA 108 (90 Base) MCG/ACT inhaler Inhale 1-2 puffs into the lungs every 6 (six) hours as needed for wheezing or shortness of breath.   No facility-administered encounter medications on file as of 12/08/2021.    Physical Exam: Blood pressure 122/74, pulse (!) 50, temperature 98.1 F (36.7 C), temperature source Oral, height '5\' 10"'$  (1.778 m), weight 212 lb (96.2 kg), SpO2 97 %. Gen:      No acute distress HEENT:  EOMI, sclera anicteric Neck:     No masses; no thyromegaly Lungs:    Clear to auscultation bilaterally; normal respiratory effort CV:         Regular rate and rhythm; no murmurs Abd:      + bowel sounds; soft, non-tender; no palpable masses, no distension Ext:    No edema; adequate peripheral perfusion Skin:      Warm and dry; no rash Neuro: alert and oriented x 3 Psych: normal mood and affect   Data Reviewed: Imaging: Chest x-ray 07/09/2020- minimal basal atelectasis Chest x-ray 07/11/2020- patchy bibasilar opacities left greater than right Chest x-ray report from primary care 08/24/2020-slight improvement in bilateral infiltrates PET scan 10/13/2020-pulmonary nodule in the left lower lobe with limited uptake.  Architectural distortion in the left lower lobe. Chest x-ray 02/17/2021-persistent left lower lobe opacity CT chest 02/25/2021- 4 mm nodule at the left lung base.  New small nodules in the inferior lingula, ascending thoracic aortic aneurysm. Chest x-ray 11/27/2021- dense right upper lobe airspace disease Chest x-ray 11/29/2021- Improving right upper lobe opacity Chest x-ray 12/08/2021- Improving right upper lobe infiltrate Cardiac CT 12/08/2021-development of right upper and middle lobe airspace disease and groundglass opacities. I have reviewed the images personally.  PFTs:   Labs:  Assessment:  Follow-up after pneumonia Recently hospitalized for pneumonia  with right upper lobe and middle lobe infiltrate.  Clinically he is improving with radiographic improvement on chest x-ray. Still has some mild cough.  Prescribe Hycodan for cough relief  He has cardiac ablation planned for next week and I feel that he would be okay to proceed with this as he is improving overall.  Asthma Continues on steroid inhaler, albuterol as needed.   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  Marshell Garfinkel MD [facility] 12/08/2021, 1:30 PM  CC: [name], MD

## 2021-12-08 NOTE — Telephone Encounter (Signed)
[facility] called to report the following from Cardiac CT today preop Afib Ablation.   "IMPRESSION: 1. Since the CT of 08/26/2021, development of right upper and right middle lobe airspace and ground-glass opacity, suspicious for infection, including atypical etiologies. Asymmetric pulmonary edema felt less likely.  Pt had recent ED visit for CAP of right upper lobe of lung. Had course of IV Antibiotics and Dc'd on oral therapy.   Called pt finished PO antibiotics last week. Still has a slight cough.  Currently on the way to OV with Pulmonologist who per pt will do a CXR.  Sent report to Pulmonologist.  Discussed  with DOD Dr. Johnsie Cancel.  No new orders given.  Will route to ordering provider.

## 2021-12-08 NOTE — Telephone Encounter (Signed)
Follow Up:     Malachy Mood from [facility] is calling with a report/

## 2021-12-08 NOTE — Patient Instructions (Signed)
I am glad you are starting to feel better The chest x-ray also shows improvement in pneumonia I will send in a prescription for Hycodan cough syrup Continue your inhalers and Singulair We will communicate with Dr. Curt Bears about your upcoming procedure Follow-up in clinic in 3 months.

## 2021-12-09 DIAGNOSIS — J189 Pneumonia, unspecified organism: Secondary | ICD-10-CM | POA: Diagnosis not present

## 2021-12-09 DIAGNOSIS — I2510 Atherosclerotic heart disease of native coronary artery without angina pectoris: Secondary | ICD-10-CM | POA: Diagnosis not present

## 2021-12-09 DIAGNOSIS — E785 Hyperlipidemia, unspecified: Secondary | ICD-10-CM | POA: Diagnosis not present

## 2021-12-09 DIAGNOSIS — I4891 Unspecified atrial fibrillation: Secondary | ICD-10-CM | POA: Diagnosis not present

## 2021-12-09 DIAGNOSIS — E871 Hypo-osmolality and hyponatremia: Secondary | ICD-10-CM | POA: Diagnosis not present

## 2021-12-09 DIAGNOSIS — J45909 Unspecified asthma, uncomplicated: Secondary | ICD-10-CM | POA: Diagnosis not present

## 2021-12-09 DIAGNOSIS — C61 Malignant neoplasm of prostate: Secondary | ICD-10-CM | POA: Diagnosis not present

## 2021-12-09 DIAGNOSIS — R600 Localized edema: Secondary | ICD-10-CM | POA: Diagnosis not present

## 2021-12-12 ENCOUNTER — Telehealth: Payer: Self-pay | Admitting: Pulmonary Disease

## 2021-12-12 ENCOUNTER — Telehealth: Payer: Self-pay | Admitting: *Deleted

## 2021-12-12 DIAGNOSIS — J189 Pneumonia, unspecified organism: Secondary | ICD-10-CM

## 2021-12-12 NOTE — Telephone Encounter (Signed)
I called and discussed with the patient. He is improving everyday with less cough.  When the x ray from last week is compared to 9/10 and 9/12 images there is clear improvement especially compared to 9/10. Since clinically he is also getting better we will continue to monitor.  Please order follow up chest x ray in 6 weeks.

## 2021-12-12 NOTE — Telephone Encounter (Signed)
Received call report from [facility]- cxr done 12/08/21  Impression:  IMPRESSION: 1. Right upper lobe airspace disease has slightly increased compared to the prior study. Recommend short-term follow-up x-ray recommended in 4-6 weeks versus follow-up chest CT.     Electronically Signed   By: Ronney Asters M.D.   On: 12/10/2021 19:54   Routing to Dr Vaughan Browner as urgent

## 2021-12-12 NOTE — Telephone Encounter (Signed)
Cxr for 6 wks order was placed.

## 2021-12-12 NOTE — Telephone Encounter (Signed)
Followed up with pt, per Dr. Curt Bears, to check on him to ensure still ok to proceed with planned ablation scheduled for Wednesday, 9/27 d/t recent PNA. Pt reports he is feeling much improved and ready for this procedure and clearance given by pulmonology to proceed with planned ablation.  Pt appreciates the follow up on this.

## 2021-12-13 NOTE — Pre-Procedure Instructions (Signed)
Instructed patient on the following items: Arrival time 0900 Nothing to eat or drink after midnight No meds AM of procedure Responsible person to drive you home and stay with you for 24 hrs  Have you missed any doses of anti-coagulant Eliquis- hasn't missed any doses    

## 2021-12-14 ENCOUNTER — Ambulatory Visit (HOSPITAL_BASED_OUTPATIENT_CLINIC_OR_DEPARTMENT_OTHER): Payer: Medicare Other | Admitting: Certified Registered"

## 2021-12-14 ENCOUNTER — Ambulatory Visit (HOSPITAL_COMMUNITY): Payer: Medicare Other | Admitting: Certified Registered"

## 2021-12-14 ENCOUNTER — Other Ambulatory Visit: Payer: Self-pay

## 2021-12-14 ENCOUNTER — Encounter (HOSPITAL_COMMUNITY)
Admission: RE | Disposition: A | Discharge status: Home / Self Care | Payer: Self-pay | Source: Home / Self Care | Attending: Cardiology

## 2021-12-14 ENCOUNTER — Encounter (HOSPITAL_COMMUNITY): Payer: Self-pay | Admitting: Cardiology

## 2021-12-14 ENCOUNTER — Ambulatory Visit (HOSPITAL_COMMUNITY)
Admission: RE | Admit: 2021-12-14 | Discharge: 2021-12-14 | Disposition: A | Discharge status: Home / Self Care | Payer: Medicare Other | Attending: Cardiology | Admitting: Cardiology

## 2021-12-14 DIAGNOSIS — Z86718 Personal history of other venous thrombosis and embolism: Secondary | ICD-10-CM | POA: Insufficient documentation

## 2021-12-14 DIAGNOSIS — Z87891 Personal history of nicotine dependence: Secondary | ICD-10-CM

## 2021-12-14 DIAGNOSIS — I4891 Unspecified atrial fibrillation: Secondary | ICD-10-CM

## 2021-12-14 DIAGNOSIS — Z79899 Other long term (current) drug therapy: Secondary | ICD-10-CM | POA: Insufficient documentation

## 2021-12-14 DIAGNOSIS — M1990 Unspecified osteoarthritis, unspecified site: Secondary | ICD-10-CM | POA: Diagnosis not present

## 2021-12-14 DIAGNOSIS — K219 Gastro-esophageal reflux disease without esophagitis: Secondary | ICD-10-CM | POA: Diagnosis not present

## 2021-12-14 DIAGNOSIS — I480 Paroxysmal atrial fibrillation: Secondary | ICD-10-CM | POA: Diagnosis not present

## 2021-12-14 DIAGNOSIS — J45909 Unspecified asthma, uncomplicated: Secondary | ICD-10-CM | POA: Diagnosis not present

## 2021-12-14 DIAGNOSIS — E785 Hyperlipidemia, unspecified: Secondary | ICD-10-CM | POA: Diagnosis not present

## 2021-12-14 DIAGNOSIS — I82409 Acute embolism and thrombosis of unspecified deep veins of unspecified lower extremity: Secondary | ICD-10-CM | POA: Diagnosis not present

## 2021-12-14 HISTORY — PX: ATRIAL FIBRILLATION ABLATION: EP1191

## 2021-12-14 LAB — POCT ACTIVATED CLOTTING TIME: Activated Clotting Time: 287 seconds

## 2021-12-14 SURGERY — ATRIAL FIBRILLATION ABLATION
Anesthesia: General

## 2021-12-14 MED ORDER — SODIUM CHLORIDE 0.9% FLUSH: 3.0000 mL | Freq: Two times a day (BID) | INTRAVENOUS | Status: DC

## 2021-12-14 MED ORDER — SODIUM CHLORIDE 0.9% FLUSH: 3.0000 mL | INTRAVENOUS | Status: DC | PRN

## 2021-12-14 MED ORDER — HEPARIN SODIUM (PORCINE) 1000 UNIT/ML IJ SOLN
INTRAMUSCULAR | Status: DC | PRN
  Administered 2021-12-14: 14000 [IU] via INTRAVENOUS
  Administered 2021-12-14: 3000 [IU] via INTRAVENOUS

## 2021-12-14 MED ORDER — LIDOCAINE 2% (20 MG/ML) 5 ML SYRINGE
INTRAMUSCULAR | Status: DC | PRN
  Administered 2021-12-14: 60 mg via INTRAVENOUS

## 2021-12-14 MED ORDER — SODIUM CHLORIDE 0.9 % IV SOLN: 250.0000 mL | INTRAVENOUS | Status: DC | PRN

## 2021-12-14 MED ORDER — ACETAMINOPHEN 325 MG PO TABS
ORAL_TABLET | ORAL | Status: AC
  Filled 2021-12-14: qty 2

## 2021-12-14 MED ORDER — PHENYLEPHRINE HCL-NACL 20-0.9 MG/250ML-% IV SOLN
INTRAVENOUS | Status: DC | PRN
  Administered 2021-12-14: 25 ug/min via INTRAVENOUS

## 2021-12-14 MED ORDER — HEPARIN SODIUM (PORCINE) 1000 UNIT/ML IJ SOLN
INTRAMUSCULAR | Status: AC
  Filled 2021-12-14: qty 10

## 2021-12-14 MED ORDER — ONDANSETRON HCL 4 MG/2ML IJ SOLN: 4.0000 mg | Freq: Four times a day (QID) | INTRAMUSCULAR | Status: DC | PRN

## 2021-12-14 MED ORDER — DOBUTAMINE INFUSION FOR EP/ECHO/NUC (1000 MCG/ML)
INTRAVENOUS | Status: AC
  Filled 2021-12-14: qty 250

## 2021-12-14 MED ORDER — SODIUM CHLORIDE 0.9 % IV SOLN: INTRAVENOUS | Status: DC

## 2021-12-14 MED ORDER — HEPARIN SODIUM (PORCINE) 1000 UNIT/ML IJ SOLN
INTRAMUSCULAR | Status: DC | PRN
  Administered 2021-12-14: 1000 [IU] via INTRAVENOUS

## 2021-12-14 MED ORDER — ROCURONIUM BROMIDE 10 MG/ML (PF) SYRINGE
PREFILLED_SYRINGE | INTRAVENOUS | Status: DC | PRN
  Administered 2021-12-14: 10 mg via INTRAVENOUS
  Administered 2021-12-14: 50 mg via INTRAVENOUS

## 2021-12-14 MED ORDER — ONDANSETRON HCL 4 MG/2ML IJ SOLN
INTRAMUSCULAR | Status: DC | PRN
  Administered 2021-12-14: 4 mg via INTRAVENOUS

## 2021-12-14 MED ORDER — HEPARIN (PORCINE) IN NACL 1000-0.9 UT/500ML-% IV SOLN
INTRAVENOUS | Status: AC
  Filled 2021-12-14: qty 500

## 2021-12-14 MED ORDER — DEXAMETHASONE SODIUM PHOSPHATE 10 MG/ML IJ SOLN
INTRAMUSCULAR | Status: DC | PRN
  Administered 2021-12-14: 5 mg via INTRAVENOUS

## 2021-12-14 MED ORDER — SUGAMMADEX SODIUM 200 MG/2ML IV SOLN
INTRAVENOUS | Status: DC | PRN
  Administered 2021-12-14: 200 mg via INTRAVENOUS

## 2021-12-14 MED ORDER — HEPARIN (PORCINE) IN NACL 1000-0.9 UT/500ML-% IV SOLN
INTRAVENOUS | Status: DC | PRN
  Administered 2021-12-14 (×4): 500 mL

## 2021-12-14 MED ORDER — FLECAINIDE ACETATE 100 MG PO TABS: 100.0000 mg | ORAL_TABLET | Freq: Two times a day (BID) | ORAL | 1 refills | Status: DC

## 2021-12-14 MED ORDER — ACETAMINOPHEN 325 MG PO TABS
650.0000 mg | ORAL_TABLET | ORAL | Status: DC | PRN
  Administered 2021-12-14: 650 mg via ORAL

## 2021-12-14 MED ORDER — PROPOFOL 10 MG/ML IV BOLUS
INTRAVENOUS | Status: DC | PRN
  Administered 2021-12-14: 100 mg via INTRAVENOUS

## 2021-12-14 MED ORDER — PROTAMINE SULFATE 10 MG/ML IV SOLN
INTRAVENOUS | Status: DC | PRN
  Administered 2021-12-14 (×2): 10 mg via INTRAVENOUS
  Administered 2021-12-14: 20 mg via INTRAVENOUS

## 2021-12-14 MED ORDER — FENTANYL CITRATE (PF) 100 MCG/2ML IJ SOLN
INTRAMUSCULAR | Status: DC | PRN
  Administered 2021-12-14 (×2): 50 ug via INTRAVENOUS

## 2021-12-14 MED ORDER — DOBUTAMINE INFUSION FOR EP/ECHO/NUC (1000 MCG/ML)
INTRAVENOUS | Status: DC | PRN
  Administered 2021-12-14: 20 ug/kg/min via INTRAVENOUS

## 2021-12-14 SURGICAL SUPPLY — 20 items
BAG SNAP BAND KOVER 36X36 (MISCELLANEOUS) IMPLANT
CATH 8FR REPROCESSED SOUNDSTAR (CATHETERS) ×1 IMPLANT
CATH 8FR SOUNDSTAR REPROCESSED (CATHETERS) IMPLANT
CATH ABLAT QDOT MICRO BI TC DF (CATHETERS) IMPLANT
CATH OCTARAY 2.0 F 3-3-3-3-3 (CATHETERS) IMPLANT
CATH PIGTAIL STEERABLE D1 8.7 (WIRE) IMPLANT
CATH S-M CIRCA TEMP PROBE (CATHETERS) IMPLANT
CATH WEB BI DIR CSDF CRV REPRO (CATHETERS) IMPLANT
CLOSURE PERCLOSE PROSTYLE (VASCULAR PRODUCTS) IMPLANT
COVER SWIFTLINK CONNECTOR (BAG) ×2 IMPLANT
PACK EP LATEX FREE (CUSTOM PROCEDURE TRAY) ×1
PACK EP LF (CUSTOM PROCEDURE TRAY) ×2 IMPLANT
PAD DEFIB RADIO PHYSIO CONN (PAD) ×2 IMPLANT
PATCH CARTO3 (PAD) IMPLANT
SHEATH CARTO VIZIGO SM CVD (SHEATH) IMPLANT
SHEATH PINNACLE 7F 10CM (SHEATH) IMPLANT
SHEATH PINNACLE 8F 10CM (SHEATH) IMPLANT
SHEATH PINNACLE 9F 10CM (SHEATH) IMPLANT
SHEATH PROBE COVER 6X72 (BAG) IMPLANT
TUBING SMART ABLATE COOLFLOW (TUBING) IMPLANT

## 2021-12-14 NOTE — Transfer of Care (Signed)
Immediate Anesthesia Transfer of Care Note  Patient: Brandon Simmons.  Procedure(s) Performed: ATRIAL FIBRILLATION ABLATION  Patient Location: Cath Lab  Anesthesia Type:General  Level of Consciousness: awake, alert  and oriented  Airway & Oxygen Therapy: Patient Spontanous Breathing and Patient connected to nasal cannula oxygen  Post-op Assessment: Report given to RN  Post vital signs: Reviewed and stable  Last Vitals:  Vitals Value Taken Time  BP 131/59 12/14/21 1240  Temp 36.8 C 12/14/21 1240  Pulse 59 12/14/21 1247  Resp 22 12/14/21 1247  SpO2 96 % 12/14/21 1247  Vitals shown include unvalidated device data.  Last Pain:  Vitals:   12/14/21 1240  TempSrc: Temporal  PainSc: 3          Complications: There were no known notable events for this encounter.

## 2021-12-14 NOTE — Discharge Instructions (Addendum)
Post procedure care instructions No driving for 4 days. No lifting over 5 lbs for 1 week. No vigorous or sexual activity for 1 week. You may return to work/your usual activities on 11/22/21. Keep procedure site clean & dry. If you notice increased pain, swelling, bleeding or pus, call/return!  You may shower after 24 hours, but no soaking in baths/hot tubs/pools for 1 week.    You have an appointment set up with the [facility].  Multiple studies have shown that being followed by a dedicated atrial fibrillation clinic in addition to the standard care you receive from your other physicians improves health. We believe that enrollment in the atrial fibrillation clinic will allow [place] to better care for you.   The phone number to the [facility] is [phone]. The clinic is staffed Monday through Friday from 8:30am to 5pm.  Parking Directions: The clinic is located in the Heart and Vascular Building connected to [facility]. 1)From [address] turn on to [address] and go to the 3rd entrance  (Heart and Vascular entrance) on the right. 2)Look to the right for Heart &Vascular Parking Garage. 3)A code for the entrance is required, for Oct is 1504.   4)Take the elevators to the 1st floor. Registration is in the room with the glass walls at the end of the hallway.  If you have any trouble parking or locating the clinic, please don't hesitate to call [phone].  Cardiac Ablation, Care After  This sheet gives you information about how to care for yourself after your procedure. Your health care provider may also give you more specific instructions. If you have problems or questions, contact your health care provider. What can I expect after the procedure? After the procedure, it is common to have: Bruising around your puncture site. Tenderness around your puncture site. Skipped heartbeats. Tiredness (fatigue).  Follow these instructions at home: Puncture  site care  Follow instructions from your health care provider about how to take care of your puncture site. Make sure you: If present, leave stitches (sutures), skin glue, or adhesive strips in place. These skin closures may need to stay in place for up to 2 weeks. If adhesive strip edges start to loosen and curl up, you may trim the loose edges. Do not remove adhesive strips completely unless your health care provider tells you to do that. If a large square bandage is present, this may be removed 24 hours after surgery.  Check your puncture site every day for signs of infection. Check for: Redness, swelling, or pain. Fluid or blood. If your puncture site starts to bleed, lie down on your back, apply firm pressure to the area, and contact your health care provider. Warmth. Pus or a bad smell. A pea or small marble sized lump at the site is normal and can take up to three months to resolve.  Driving Do not drive for at least 4 days after your procedure or however long your health care provider recommends. (Do not resume driving if you have previously been instructed not to drive for other health reasons.) Do not drive or use heavy machinery while taking prescription pain medicine. Activity Avoid activities that take a lot of effort for at least 7 days after your procedure. Do not lift anything that is heavier than 5 lb (4.5 kg) for one week.  No sexual activity for 1 week.  Return to your normal activities as told by your health care provider. Ask your health care  provider what activities are safe for you. General instructions Take over-the-counter and prescription medicines only as told by your health care provider. Do not use any products that contain nicotine or tobacco, such as cigarettes and e-cigarettes. If you need help quitting, ask your health care provider. You may shower after 24 hours, but Do not take baths, swim, or use a hot tub for 1 week.  Do not drink alcohol for 24 hours after  your procedure. Keep all follow-up visits as told by your health care provider. This is important. Contact a health care provider if: You have redness, mild swelling, or pain around your puncture site. You have fluid or blood coming from your puncture site that stops after applying firm pressure to the area. Your puncture site feels warm to the touch. You have pus or a bad smell coming from your puncture site. You have a fever. You have chest pain or discomfort that spreads to your neck, jaw, or arm. You are sweating a lot. You feel nauseous. You have a fast or irregular heartbeat. You have shortness of breath. You are dizzy or light-headed and feel the need to lie down. You have pain or numbness in the arm or leg closest to your puncture site. Get help right away if: Your puncture site suddenly swells. Your puncture site is bleeding and the bleeding does not stop after applying firm pressure to the area. These symptoms may represent a serious problem that is an emergency. Do not wait to see if the symptoms will go away. Get medical help right away. Call your local emergency services (911 in the [place] Do not drive yourself to the hospital. Summary After the procedure, it is normal to have bruising and tenderness at the puncture site in your groin, neck, or forearm. Check your puncture site every day for signs of infection. Get help right away if your puncture site is bleeding and the bleeding does not stop after applying firm pressure to the area. This is a medical emergency. This information is not intended to replace advice given to you by your health care provider. Make sure you discuss any questions you have with your health care provider.

## 2021-12-14 NOTE — Anesthesia Procedure Notes (Signed)
Procedure Name: Intubation Date/Time: 12/14/2021 10:36 AM  Performed by: Barrington Ellison, CRNAPre-anesthesia Checklist: Patient identified, Emergency Drugs available, Suction available and Patient being monitored Patient Re-evaluated:Patient Re-evaluated prior to induction Oxygen Delivery Method: Circle System Utilized Preoxygenation: Pre-oxygenation with 100% oxygen Induction Type: IV induction Ventilation: Mask ventilation without difficulty Laryngoscope Size: Mac and 4 Grade View: Grade I Tube type: Oral Tube size: 7.5 mm Number of attempts: 1 Airway Equipment and Method: Stylet and Oral airway Placement Confirmation: ETT inserted through vocal cords under direct vision, positive ETCO2 and breath sounds checked- equal and bilateral Secured at: 22 cm Tube secured with: Tape Dental Injury: Teeth and Oropharynx as per pre-operative assessment

## 2021-12-14 NOTE — Anesthesia Preprocedure Evaluation (Addendum)
Anesthesia Evaluation  Patient identified by MRN, date of birth, ID band Patient awake    Reviewed: Allergy & Precautions, NPO status , Patient's Chart, lab work & pertinent test results  Airway Mallampati: III  TM Distance: >3 FB Neck ROM: Full    Dental no notable dental hx.    Pulmonary asthma , former smoker,    Pulmonary exam normal        Cardiovascular + DVT  Normal cardiovascular exam+ dysrhythmias Atrial Fibrillation      Neuro/Psych negative neurological ROS  negative psych ROS   GI/Hepatic Neg liver ROS, GERD  Medicated and Controlled,  Endo/Other  negative endocrine ROS  Renal/GU Renal disease     Musculoskeletal  (+) Arthritis ,   Abdominal   Peds  Hematology  (+) Blood dyscrasia, anemia ,   Anesthesia Other Findings A-fib  Reproductive/Obstetrics                            Anesthesia Physical Anesthesia Plan  ASA: 3  Anesthesia Plan: General   Post-op Pain Management:    Induction: Intravenous  PONV Risk Score and Plan: 2 and Ondansetron, Dexamethasone and Treatment may vary due to age or medical condition  Airway Management Planned: Oral ETT  Additional Equipment:   Intra-op Plan:   Post-operative Plan: Extubation in OR  Informed Consent: I have reviewed the patients History and Physical, chart, labs and discussed the procedure including the risks, benefits and alternatives for the proposed anesthesia with the patient or authorized representative who has indicated his/her understanding and acceptance.     Dental advisory given  Plan Discussed with: CRNA  Anesthesia Plan Comments:        Anesthesia Quick Evaluation

## 2021-12-14 NOTE — Anesthesia Postprocedure Evaluation (Signed)
Anesthesia Post Note  Patient: Brandon Simmons.  Procedure(s) Performed: ATRIAL FIBRILLATION ABLATION     Patient location during evaluation: Cath Lab Anesthesia Type: General Level of consciousness: awake Pain management: pain level controlled Vital Signs Assessment: post-procedure vital signs reviewed and stable Respiratory status: spontaneous breathing, nonlabored ventilation, respiratory function stable and patient connected to nasal cannula oxygen Cardiovascular status: blood pressure returned to baseline and stable Postop Assessment: no apparent nausea or vomiting Anesthetic complications: no   There were no known notable events for this encounter.  Last Vitals:  Vitals:   12/14/21 1400 12/14/21 1430  BP: 102/62 105/61  Pulse: (!) 56 (!) 54  Resp: (!) 25 19  Temp:    SpO2: 93% 91%    Last Pain:  Vitals:   12/14/21 1309  TempSrc:   PainSc: 6                  Adeline Petitfrere P Kainan Patty

## 2021-12-14 NOTE — H&P (Signed)
Electrophysiology Office Note   Date:  12/14/2021   ID:  Brandon Alt., DOB [birth year], MRN [id number]  PCP:  [name], MD  Cardiologist:  Allred Primary Electrophysiologist:  Bilaal Leib Meredith Leeds, MD    Chief Complaint: AF   History of Present Illness: Brandon Bruk [place]. is a 76 y.o. male who is being seen today for the evaluation of AF at the request of Constance Haw, MD. Presenting today for electrophysiology evaluation.  Has a history significant for atrial fibrillation, hyperlipidemia, ectatic aorta.  He is status post ablation x2 on 03/28/2018 and 11/11/2019.  He continued to have episodes of atrial fibrillation despite 2 ablations.  At his last visit, his flecainide was increased to 150 mg twice daily.  He had an exercise treadmill test that showed no lengthening of his QRS complex.  He unfortunately is continued to have episodes of atrial fibrillation.  He has intermittent episodes where his heart rate is 150 beats a minute, but also episodes where his heart rate is slow.  He feels weak and fatigued when he is in atrial fibrillation.  Today, denies symptoms of palpitations, chest pain, shortness of breath, orthopnea, PND, lower extremity edema, claudication, dizziness, presyncope, syncope, bleeding, or neurologic sequela. The patient is tolerating medications without difficulties. Plan for ablation today.    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 4.3cm by CT 03/2018.   GERD (gastroesophageal reflux disease)    Hemorrhoids    History of echocardiogram    a. Echo 10/16 ([place]):  EF 55%, trace MR, mild TR, mild to mod AI, mild dilated Ao root (41 mm)   Hyperlipemia    Organic erectile dysfunction    PAF  (paroxysmal atrial fibrillation) ([place])    a. admx to [facility] in [place], [place] 10/16 with AF with RVR >> converted to NSR with IV Dilt;  b. Flecainide started >> ETT neg for pro-arrhythmia    Pneumonia 06/2020   Prostate cancer ([place]) 04/25/2006   Gleason 3+4=7   Prostatitis    S/P cardiac cath    a. LHC at [facility] 10/16:  LM ok, mLAD 30%, LCx ok, dRCA 20%   S/P radiation therapy 01/19/2014 through 03/05/2014                                                      Prostate bed 6600 cGy in 33 sessions                           Torn rotator cuff    right  40% tear   Past Surgical History:  Procedure Laterality Date   ABLATION OF DYSRHYTHMIC FOCUS  03/28/2018   APPENDECTOMY  1967   ATRIAL FIBRILLATION ABLATION N/A 03/28/2018   Procedure: ATRIAL FIBRILLATION ABLATION;  Surgeon: [name], MD;  Location: [place] [facility];  Service: Cardiovascular;  Laterality: N/A;   ATRIAL FIBRILLATION ABLATION N/A 11/11/2019   Procedure: ATRIAL  FIBRILLATION ABLATION;  Surgeon: [name], MD;  Location: [place] [facility];  Service: Cardiovascular;  Laterality: N/A;   BACK SURGERY  2019   CARDIAC CATHETERIZATION  2016   [place]   CATARACT EXTRACTION W/ INTRAOCULAR LENS IMPLANT Left 11/2011   CATARACT EXTRACTION W/ INTRAOCULAR LENS IMPLANT Right 09/2015   COLONOSCOPY     LARYNX SURGERY  2000   vocal cord lesion- benign   LUMBAR LAMINECTOMY/DECOMPRESSION MICRODISCECTOMY Right 07/26/2017   Procedure: RIGHT LUMBAR 3- LUMBAR 4 FORAMINOTOMY WITH RESECTION OF SYNOVIAL CYST;  Surgeon: Erline Levine, MD;  Location: [facility];  Service: Neurosurgery;  Laterality: Right;  RIGHT LUMBAR 3- LUMBAR 4 FORAMINOTOMY WITH RESECTION OF SYNOVIAL CYST   MENISCUS REPAIR Right    POLYPECTOMY     PROSTATE BIOPSY  1999   PROSTATECTOMY  04/25/2006   REVERSE SHOULDER ARTHROPLASTY Left 09/02/2020   Procedure: REVERSE SHOULDER ARTHROPLASTY;  Surgeon: [name], MD;  Location: [facility];  Service: Orthopedics;  Laterality:  Left;  123mn     Current Facility-Administered Medications  Medication Dose Route Frequency Provider Last Rate Last Admin   0.9 %  sodium chloride infusion   Intravenous Continuous Khoi Hamberger, WOcie Doyne MD        Allergies:   Patient has no known allergies.   Social History:  The patient  reports that he quit smoking about 51 years ago. His smoking use included cigarettes. He has a 10.00 pack-year smoking history. He has never used smokeless tobacco. He reports that he does not currently use alcohol. He reports that he does not use drugs.   Family History:  The patient's family history includes Arthritis in his mother; Atrial fibrillation in his son; Diabetes in his maternal grandmother; Emphysema in his father; Heart disease in his maternal grandfather, mother, and paternal grandfather; Hypertension in his mother; Liver cancer in his paternal grandmother; Lung cancer in his father.   ROS:  Please see the history of present illness.   Otherwise, review of systems is positive for none.   All other systems are reviewed and negative.   PHYSICAL EXAM: VS:  BP 128/72   Pulse (!) 54   Temp 97.9 F (36.6 C) (Oral)   Resp 18   Ht '5\' 10"'$  (1.778 m)   Wt 93.9 kg   SpO2 100%   BMI 29.70 kg/m  , BMI Body mass index is 29.7 kg/m. GEN: Well nourished, well developed, in no acute distress  HEENT: normal  Neck: no JVD, carotid bruits, or masses Cardiac: RRR; no murmurs, rubs, or gallops,no edema  Respiratory:  clear to auscultation bilaterally, normal work of breathing GI: soft, nontender, nondistended, + BS MS: no deformity or atrophy  Skin: warm and dry Neuro:  Strength and sensation are intact Psych: euthymic mood, full affect   Recent Labs: 11/27/2021: ALT 31 11/29/2021: Magnesium 2.1 12/06/2021: BUN 11; Creatinine, Ser 1.03; Hemoglobin 11.9; Platelets 344; Potassium 4.3; Sodium 137    Lipid Panel  No results found for: "CHOL", "TRIG", "HDL", "CHOLHDL", "VLDL", "LDLCALC",  "LDLDIRECT"   Wt Readings from Last 3 Encounters:  12/14/21 93.9 kg  12/08/21 96.2 kg  11/27/21 99 kg      Other studies Reviewed: Additional studies/ records that were reviewed today include: TTE 08/24/18  Review of the above records today demonstrates:   1. The left ventricle has normal systolic function with an ejection  fraction of 60-65%. The cavity size was normal. Left ventricular diastolic  Doppler parameters are consistent with pseudonormalization.  2. The right ventricle has normal systolic function. The cavity was  normal. There is no increase in right ventricular wall thickness.   3. No evidence of mitral valve stenosis.   4. Aortic valve regurgitation is mild by color flow Doppler. No stenosis  of the aortic valve.   5. There is mild dilatation of the aortic root and of the ascending  aorta.   6. The interatrial septum was not assessed.    ASSESSMENT AND PLAN:  1.  Paroxysmal atrial fibrillation: Brandon Oren [place]. has presented today for surgery, with the diagnosis of AF.  The various methods of treatment have been discussed with the patient and family. After consideration of risks, benefits and other options for treatment, the patient has consented to  Procedure(s): Catheter ablation as a surgical intervention .  Risks include but not limited to complete heart block, stroke, esophageal damage, nerve damage, bleeding, vascular damage, tamponade, perforation, MI, and death. The patient's history has been reviewed, patient examined, no change in status, stable for surgery.  I have reviewed the patient's chart and labs.  Questions were answered to the patient's satisfaction.    Brandon Cassels Curt Bears, MD 12/14/2021 9:49 AM

## 2021-12-15 ENCOUNTER — Encounter (HOSPITAL_COMMUNITY): Payer: Self-pay | Admitting: Cardiology

## 2021-12-20 ENCOUNTER — Other Ambulatory Visit (HOSPITAL_BASED_OUTPATIENT_CLINIC_OR_DEPARTMENT_OTHER): Payer: Self-pay

## 2021-12-20 DIAGNOSIS — J301 Allergic rhinitis due to pollen: Secondary | ICD-10-CM | POA: Diagnosis not present

## 2021-12-20 DIAGNOSIS — J3089 Other allergic rhinitis: Secondary | ICD-10-CM | POA: Diagnosis not present

## 2021-12-20 DIAGNOSIS — Z23 Encounter for immunization: Secondary | ICD-10-CM | POA: Diagnosis not present

## 2021-12-20 DIAGNOSIS — J3081 Allergic rhinitis due to animal (cat) (dog) hair and dander: Secondary | ICD-10-CM | POA: Diagnosis not present

## 2021-12-20 MED ORDER — FLUAD QUADRIVALENT 0.5 ML IM PRSY
PREFILLED_SYRINGE | INTRAMUSCULAR | 0 refills | Status: DC
  Filled 2021-12-20: qty 0.5, 1d supply, fill #0

## 2021-12-27 ENCOUNTER — Telehealth (HOSPITAL_COMMUNITY): Payer: Self-pay | Admitting: *Deleted

## 2021-12-27 DIAGNOSIS — J301 Allergic rhinitis due to pollen: Secondary | ICD-10-CM | POA: Diagnosis not present

## 2021-12-27 DIAGNOSIS — J3081 Allergic rhinitis due to animal (cat) (dog) hair and dander: Secondary | ICD-10-CM | POA: Diagnosis not present

## 2021-12-27 DIAGNOSIS — J3089 Other allergic rhinitis: Secondary | ICD-10-CM | POA: Diagnosis not present

## 2021-12-27 NOTE — Telephone Encounter (Signed)
Patient back in afib this morning HRs in the 120-130s. Pt took '30mg'$  of cardizem this morning at 10am without much improvement. Discussed with Roderic Palau NP will take an extra '50mg'$  of flecainde & '30mg'$  of cardizem now and call with update in a few hours. Pt in agreement.

## 2021-12-28 NOTE — Telephone Encounter (Signed)
Patient converted into NSR around midnight last night. Will call if further issues.

## 2022-01-03 DIAGNOSIS — J3081 Allergic rhinitis due to animal (cat) (dog) hair and dander: Secondary | ICD-10-CM | POA: Diagnosis not present

## 2022-01-03 DIAGNOSIS — Z23 Encounter for immunization: Secondary | ICD-10-CM | POA: Diagnosis not present

## 2022-01-03 DIAGNOSIS — J3089 Other allergic rhinitis: Secondary | ICD-10-CM | POA: Diagnosis not present

## 2022-01-03 DIAGNOSIS — J301 Allergic rhinitis due to pollen: Secondary | ICD-10-CM | POA: Diagnosis not present

## 2022-01-10 DIAGNOSIS — J3089 Other allergic rhinitis: Secondary | ICD-10-CM | POA: Diagnosis not present

## 2022-01-10 DIAGNOSIS — J301 Allergic rhinitis due to pollen: Secondary | ICD-10-CM | POA: Diagnosis not present

## 2022-01-10 DIAGNOSIS — J3081 Allergic rhinitis due to animal (cat) (dog) hair and dander: Secondary | ICD-10-CM | POA: Diagnosis not present

## 2022-01-17 DIAGNOSIS — J301 Allergic rhinitis due to pollen: Secondary | ICD-10-CM | POA: Diagnosis not present

## 2022-01-17 DIAGNOSIS — J3089 Other allergic rhinitis: Secondary | ICD-10-CM | POA: Diagnosis not present

## 2022-01-17 DIAGNOSIS — J3081 Allergic rhinitis due to animal (cat) (dog) hair and dander: Secondary | ICD-10-CM | POA: Diagnosis not present

## 2022-01-20 ENCOUNTER — Ambulatory Visit (INDEPENDENT_AMBULATORY_CARE_PROVIDER_SITE_OTHER): Payer: Medicare Other

## 2022-01-20 ENCOUNTER — Encounter: Payer: Self-pay | Admitting: Pulmonary Disease

## 2022-01-20 ENCOUNTER — Ambulatory Visit (INDEPENDENT_AMBULATORY_CARE_PROVIDER_SITE_OTHER): Payer: Medicare Other | Admitting: Pulmonary Disease

## 2022-01-20 VITALS — BP 136/76 | HR 68 | Temp 98.1°F | Ht 70.0 in | Wt 210.8 lb

## 2022-01-20 DIAGNOSIS — J189 Pneumonia, unspecified organism: Secondary | ICD-10-CM

## 2022-01-20 DIAGNOSIS — M19011 Primary osteoarthritis, right shoulder: Secondary | ICD-10-CM | POA: Diagnosis not present

## 2022-01-20 DIAGNOSIS — R918 Other nonspecific abnormal finding of lung field: Secondary | ICD-10-CM | POA: Diagnosis not present

## 2022-01-20 DIAGNOSIS — Z8701 Personal history of pneumonia (recurrent): Secondary | ICD-10-CM | POA: Diagnosis not present

## 2022-01-20 DIAGNOSIS — J4530 Mild persistent asthma, uncomplicated: Secondary | ICD-10-CM

## 2022-01-20 NOTE — Progress Notes (Signed)
Brandon Simmons    [id number]    1946/01/10  Primary Care Physician:Paterson, Ermalene Searing, MD  Referring Physician: [name], MD [address] [place],   [address]   Chief complaint: Follow-up for pneumonia, mild asthma  HPI: 76 year old with history of arrhythmias, asthma, prostate cancer, DVT Developed community-acquired pneumonia in mid April.  Symptoms started off with a upper respiratory viral infection and proceeded to cough, fevers of 102.  He was evaluated in the ED on 4/22 and given amoxicillin with no improvement.  He was reevaluated on 4/24 and doxycycline added.  He got a round of prednisone as well.  He was given breztri from his primary care doctor but had to stop after few days as he developed sore throat and hoarseness.  He then got levofloxacin and prednisone for 7 days.  Unfortunately developed atrial fibrillation transiently while on prednisone but converted back to sinus rhythm.  Overall improved with this treatment  History notable for mild asthma for which he sees Dr. Remus Blake at allergy center.  Treated with Asmanex inhaler, albuterol. He also had a DVT in 2019 in the setting of surgery and was treated with anticoagulants  Developed COVID-19 in October 2022 after a trip to [place].  Treated with Molnupiravir but has persistent cough since then.     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  No feather pillows or comforters Smoking history: Minimal smoking history in his 57s Travel history: No significant travel history Relevant family history: No family history of lung disease  Interim history: Admitted to [facility] long hospital in September 2013 for community-acquired pneumonia with right upper lobe and middle lobe infiltrate.  Treated with Rocephin and doxycycline with negative cultures.  Switch to [facility] on discharge for total of 7  days of therapy.  Noted to be encephalopathic with hyponatremia which resolved  Post discharge he is here for follow-up.  States that his cough is getting better every day but still has some persistent symptoms.  Outpatient Encounter Medications as of 01/20/2022  Medication Sig   acetaminophen (TYLENOL) 500 MG tablet Take 500 mg by mouth 2 (two) times daily as needed.   apixaban (ELIQUIS) 5 MG TABS tablet Take 5 mg by mouth 2 (two) times daily.    atorvastatin (LIPITOR) 20 MG tablet Take 20 mg by mouth daily.   Azelastine HCl 137 MCG/SPRAY SOLN Inhale 1 spray into the lungs in the morning and at bedtime.   diltiazem (CARDIZEM CD) 180 MG 24 hr capsule TAKE 1 CAPSULE (180 MG TOTAL) BY MOUTH DAILY IN THE AFTERNOON.   diltiazem (CARDIZEM) 30 MG tablet TAKE 1 TABLET EVERY 4 HOURS AS NEEDED FOR AFIB RAPID HEART RATE OVER 100   EPINEPHrine 0.3 mg/0.3 mL IJ SOAJ injection Inject 0.3 mg into the muscle as needed for anaphylaxis.   fexofenadine (ALLEGRA ALLERGY) 180 MG tablet Take 1 tablet by mouth daily.   flecainide (TAMBOCOR) 100 MG tablet Take 1 tablet (100 mg total) by mouth 2 (two) times daily.   fluticasone (FLONASE) 50 MCG/ACT nasal spray Place 1 spray into both nostrils daily.   GLUCOSAMINE-CHONDROITIN PO Take 1,500 mg by mouth daily.   HYDROcodone bit-homatropine (HYCODAN) 5-1.5 MG/5ML syrup Take 5 mLs by mouth every 6 (six) hours as needed for cough.   influenza vaccine adjuvanted (FLUAD QUADRIVALENT) 0.5 ML injection Inject into the  muscle.   ipratropium (ATROVENT) 0.06 % nasal spray Place 2 sprays into both nostrils 3 (three) times daily.   Mometasone Furoate (ASMANEX HFA) 100 MCG/ACT AERO Inhale 1 puff into the lungs every evening.   montelukast (SINGULAIR) 10 MG tablet Take 10 mg by mouth at bedtime.   Naphazoline-Pheniramine (OPCON-A) 0.027-0.315 % SOLN Place 1 drop into both eyes 2 (two) times daily as needed (eye allergies (red/irritated eyes)).   NON FORMULARY Allergy Shots every 3  weeks   omeprazole (PRILOSEC) 20 MG capsule Take 20 mg by mouth 2 (two) times daily before a meal.    Probiotic Product (ALIGN) 4 MG CAPS Take 4 mg by mouth every evening.   psyllium (METAMUCIL) 58.6 % powder Take 1 packet by mouth 2 (two) times daily.   VENTOLIN HFA 108 (90 Base) MCG/ACT inhaler Inhale 1-2 puffs into the lungs every 6 (six) hours as needed for wheezing or shortness of breath.   No facility-administered encounter medications on file as of 01/20/2022.    Physical Exam: Blood pressure 122/74, pulse (!) 50, temperature 98.1 F (36.7 C), temperature source Oral, height '5\' 10"'$  (1.778 m), weight 212 lb (96.2 kg), SpO2 97 %. Gen:      No acute distress HEENT:  EOMI, sclera anicteric Neck:     No masses; no thyromegaly Lungs:    Clear to auscultation bilaterally; normal respiratory effort CV:         Regular rate and rhythm; no murmurs Abd:      + bowel sounds; soft, non-tender; no palpable masses, no distension Ext:    No edema; adequate peripheral perfusion Skin:      Warm and dry; no rash Neuro: alert and oriented x 3 Psych: normal mood and affect   Data Reviewed: Imaging: Chest x-ray 07/09/2020- minimal basal atelectasis Chest x-ray 07/11/2020- patchy bibasilar opacities left greater than right Chest x-ray report from primary care 08/24/2020-slight improvement in bilateral infiltrates PET scan 10/13/2020-pulmonary nodule in the left lower lobe with limited uptake.  Architectural distortion in the left lower lobe. Chest x-ray 02/17/2021-persistent left lower lobe opacity CT chest 02/25/2021- 4 mm nodule at the left lung base.  New small nodules in the inferior lingula, ascending thoracic aortic aneurysm. Chest x-ray 11/27/2021- dense right upper lobe airspace disease Chest x-ray 11/29/2021- Improving right upper lobe opacity Chest x-ray 12/08/2021- Improving right upper lobe infiltrate Cardiac CT 12/08/2021-development of right upper and middle lobe airspace disease and groundglass  opacities. Chest x-ray 01/20/2022-improvement in right upper lobe opacity. I have reviewed the images personally.  PFTs:   Labs:  Assessment:  Follow-up after pneumonia Recently hospitalized for pneumonia with right upper lobe and middle lobe infiltrate.  Clinically he is improving with radiographic improvement on chest x-ray. I reviewed his x-ray today which showed continued improvement   Asthma Continues on steroid inhaler, albuterol as needed.   He may benefit from Symbicort inhaler but he wants to avoid as it had caused hoarseness of voice in the past and he would like to continue singing in the church choir  He is asking about steroid inhalers and risk of pneumonia.  We discussed risk-benefit assessment of steroid inhaler if it is keeping his asthma under control.  He is currently using the Asmanex once at night but will try every other day to see how he does.  He will also discuss this with his allergist Dr. Remus Blake.  Plan/Recommendations: Continue Asmanex, Singulair  Follow-up in 6 months.  Marshell Garfinkel MD [facility] 01/20/2022, 9:55 AM  CC: [name], MD

## 2022-01-20 NOTE — Patient Instructions (Signed)
I am glad you are improving Chest x-ray looks better as well Continue your inhalers. Follow-up in 6 months with either video visit or in person visit.

## 2022-02-07 DIAGNOSIS — J3081 Allergic rhinitis due to animal (cat) (dog) hair and dander: Secondary | ICD-10-CM | POA: Diagnosis not present

## 2022-02-07 DIAGNOSIS — J301 Allergic rhinitis due to pollen: Secondary | ICD-10-CM | POA: Diagnosis not present

## 2022-02-07 DIAGNOSIS — J3089 Other allergic rhinitis: Secondary | ICD-10-CM | POA: Diagnosis not present

## 2022-02-08 DIAGNOSIS — J301 Allergic rhinitis due to pollen: Secondary | ICD-10-CM | POA: Diagnosis not present

## 2022-02-08 DIAGNOSIS — J3089 Other allergic rhinitis: Secondary | ICD-10-CM | POA: Diagnosis not present

## 2022-02-20 ENCOUNTER — Ambulatory Visit: Payer: Medicare Other | Admitting: Pulmonary Disease

## 2022-02-23 DIAGNOSIS — J3089 Other allergic rhinitis: Secondary | ICD-10-CM | POA: Diagnosis not present

## 2022-02-23 DIAGNOSIS — J301 Allergic rhinitis due to pollen: Secondary | ICD-10-CM | POA: Diagnosis not present

## 2022-02-23 DIAGNOSIS — J3081 Allergic rhinitis due to animal (cat) (dog) hair and dander: Secondary | ICD-10-CM | POA: Diagnosis not present

## 2022-02-27 DIAGNOSIS — H43813 Vitreous degeneration, bilateral: Secondary | ICD-10-CM | POA: Diagnosis not present

## 2022-02-27 DIAGNOSIS — H524 Presbyopia: Secondary | ICD-10-CM | POA: Diagnosis not present

## 2022-02-27 DIAGNOSIS — H26493 Other secondary cataract, bilateral: Secondary | ICD-10-CM | POA: Diagnosis not present

## 2022-02-27 DIAGNOSIS — H04123 Dry eye syndrome of bilateral lacrimal glands: Secondary | ICD-10-CM | POA: Diagnosis not present

## 2022-02-27 DIAGNOSIS — H52203 Unspecified astigmatism, bilateral: Secondary | ICD-10-CM | POA: Diagnosis not present

## 2022-02-27 DIAGNOSIS — H5213 Myopia, bilateral: Secondary | ICD-10-CM | POA: Diagnosis not present

## 2022-03-15 ENCOUNTER — Ambulatory Visit (HOSPITAL_COMMUNITY)
Admit: 2022-03-15 | Discharge: 2022-03-15 | Disposition: A | Discharge status: Home / Self Care | Payer: Medicare Other | Attending: Nurse Practitioner | Admitting: Nurse Practitioner

## 2022-03-15 ENCOUNTER — Encounter (HOSPITAL_COMMUNITY): Payer: Self-pay | Admitting: Nurse Practitioner

## 2022-03-15 VITALS — BP 130/78 | HR 54 | Ht 70.0 in | Wt 214.0 lb

## 2022-03-15 DIAGNOSIS — R001 Bradycardia, unspecified: Secondary | ICD-10-CM | POA: Insufficient documentation

## 2022-03-15 DIAGNOSIS — D6869 Other thrombophilia: Secondary | ICD-10-CM | POA: Diagnosis not present

## 2022-03-15 DIAGNOSIS — Z7901 Long term (current) use of anticoagulants: Secondary | ICD-10-CM | POA: Diagnosis not present

## 2022-03-15 DIAGNOSIS — I480 Paroxysmal atrial fibrillation: Secondary | ICD-10-CM | POA: Insufficient documentation

## 2022-03-15 DIAGNOSIS — Z79899 Other long term (current) drug therapy: Secondary | ICD-10-CM | POA: Insufficient documentation

## 2022-03-15 NOTE — Progress Notes (Signed)
Primary Care Physician: [name], MD Referring Physician:Dr. Nichola Sizer. is a 76 y.o. male with a h/o paroxysmal afib that is in the afib clinic for onset afib last pm.  He  had ablation in 03/2018. He had to go back on daily flecainide and Cardizem as he had some stuttering afib after trying to stop antiarrythmic at 3 month f/u.  He has enjoyed sinus rhythm since being back on daily meds until last night. He also had Rocky Spotted Tick fever within the last few weeks and was treated with doxycycline. He had a tooth pulled 2/2 to infection last week and just finished amoxicillin. He stopped anticoagulation for 1 day  then resumed Friday. He is also having new onset of pain in his rt leg that his PCP does not think is related to tick bite but possibly 2/2 back issues. He tried an extra 30 mg Cardizem last night but felt like it bottomed out his BP.   F/u in afib clinic, 11/17/19. He had a repeat ablation last week for increase in afib burden. Over the weekend, he felt weak and lightheaded. Yesterday, he did not feel weill with HR's in the 40's to the 70's. He did not feel this to be afib but would note several regular bests then a pause in his  heart rhythm. He felt  lightheaded with this. When this was happening  he also felt "hollow" in his chest. Today feels better and EKG shows NSR. His flecainide and CCB was stopped at time of ablation.   F/u in afib clinic 12/17/19, one month s/p ablation. He feels great. No afib to appreciate. Had palpitations early on after ablation that sounded more like PC's, these have resolved. No swallowing or groin issues.   F/u in afib clinic, 07/22/20. He is being seen urgently as he went out of rhythm yesterday with v rates around 150 bpm. . He has been sick for the last 3 weeks with pneumonia and just a few days ago was placed on another  antibiotic and prednisone. He was placed back on diltiazem 180 mg daily yesterday pm and this am he felt  as though he may be back in rhythm for short periods of time. I feel the infection and prednisone may have been the trigger and may self convert when he is over the meds/infections. He remains on eliquis 5 mg bid for a CHA2DS2VASc of 2.          F/u in afib clinic, 11/17/20. He continues to have break thru afib once a week, longest episode was 20 hours.  He uses his 30 mg Cardizem and will take one extra 100 mg flecainide if needed to convert to SR. Usually this is successful. We discussed other antiarrythmic's if afib burden escalates. He is planning a trip to the [place] in late September.           F/u in afib clinic, 03/15/22. He is now 3 months from his 3rd ablation. He had 2 short episodes of afib in early December. Otherwise, feels good. He will see Dr. Curt Bears 04/04/21. He plans to travel for 3-4 weeks in [place] next year.  Today, he denies symptoms of palpitations, chest pain, shortness of breath, orthopnea, PND, lower extremity edema, dizziness, presyncope, syncope, or neurologic sequela. The patient is tolerating medications without difficulties and is otherwise without complaint today.   Past Medical History:  Diagnosis Date   Aortic atherosclerosis ([place])  Arthritis    Asthma    Diverticulitis    Diverticulosis    DVT (deep venous thrombosis) ([place]) 2019   right leg after back surgery   Dysrhythmia    A-fib every few weeks   Ectatic thoracic aorta ([place])    a. 4.3cm by CT 03/2018.   GERD (gastroesophageal reflux disease)    Hemorrhoids    History of echocardiogram    a. Echo 10/16 ([place]):  EF 55%, trace MR, mild TR, mild to mod AI, mild dilated Ao root (41 mm)   Hyperlipemia    Organic erectile dysfunction    PAF (paroxysmal atrial fibrillation) ([place])    a. admx to [facility] in [place], [place] 10/16 with AF with RVR >> converted to NSR with IV Dilt;  b. Flecainide started >> ETT neg for pro-arrhythmia    Pneumonia 06/2020   Prostate cancer ([place]) 04/25/2006   Gleason  3+4=7   Prostatitis    S/P cardiac cath    a. LHC at [facility] 10/16:  LM ok, mLAD 30%, LCx ok, dRCA 20%   S/P radiation therapy 01/19/2014 through 03/05/2014                                                      Prostate bed 6600 cGy in 33 sessions                           Torn rotator cuff    right  40% tear   Past Surgical History:  Procedure Laterality Date   ABLATION OF DYSRHYTHMIC FOCUS  03/28/2018   APPENDECTOMY  1967   ATRIAL FIBRILLATION ABLATION N/A 03/28/2018   Procedure: ATRIAL FIBRILLATION ABLATION;  Surgeon: [name], MD;  Location: [facility];  Service: Cardiovascular;  Laterality: N/A;   ATRIAL FIBRILLATION ABLATION N/A 11/11/2019   Procedure: ATRIAL FIBRILLATION ABLATION;  Surgeon: [name], MD;  Location: [facility];  Service: Cardiovascular;  Laterality: N/A;   ATRIAL FIBRILLATION ABLATION N/A 12/14/2021   Procedure: ATRIAL FIBRILLATION ABLATION;  Surgeon: Constance Haw, MD;  Location: [facility];  Service: Cardiovascular;  Laterality: N/A;   BACK SURGERY  2019   CARDIAC CATHETERIZATION  2016   [place]   CATARACT EXTRACTION W/ INTRAOCULAR LENS IMPLANT Left 11/2011   CATARACT EXTRACTION W/ INTRAOCULAR LENS IMPLANT Right 09/2015   COLONOSCOPY     LARYNX SURGERY  2000   vocal cord lesion- benign   LUMBAR LAMINECTOMY/DECOMPRESSION MICRODISCECTOMY Right 07/26/2017   Procedure: RIGHT LUMBAR 3- LUMBAR 4 FORAMINOTOMY WITH RESECTION OF SYNOVIAL CYST;  Surgeon: Erline Levine, MD;  Location: [facility];  Service: Neurosurgery;  Laterality: Right;  RIGHT LUMBAR 3- LUMBAR 4 FORAMINOTOMY WITH RESECTION OF SYNOVIAL CYST   MENISCUS REPAIR Right    POLYPECTOMY     PROSTATE BIOPSY  1999   PROSTATECTOMY  04/25/2006   REVERSE SHOULDER ARTHROPLASTY Left 09/02/2020   Procedure: REVERSE SHOULDER ARTHROPLASTY;  Surgeon: [name], MD;  Location: [facility];  Service: Orthopedics;  Laterality: Left;  185mn    Current Outpatient Medications   Medication Sig Dispense Refill   acetaminophen (TYLENOL) 500 MG tablet Take 500 mg by mouth 2 (two) times daily as needed.     apixaban (ELIQUIS) 5 MG TABS tablet Take  5 mg by mouth 2 (two) times daily.      atorvastatin (LIPITOR) 20 MG tablet Take 20 mg by mouth daily.     diltiazem (CARDIZEM CD) 180 MG 24 hr capsule TAKE 1 CAPSULE (180 MG TOTAL) BY MOUTH DAILY IN THE AFTERNOON. 90 capsule 3   diltiazem (CARDIZEM) 30 MG tablet TAKE 1 TABLET EVERY 4 HOURS AS NEEDED FOR AFIB RAPID HEART RATE OVER 100 540 tablet 1   EPINEPHrine 0.3 mg/0.3 mL IJ SOAJ injection Inject 0.3 mg into the muscle as needed for anaphylaxis.     fexofenadine (ALLEGRA ALLERGY) 180 MG tablet Take 1 tablet by mouth daily.     flecainide (TAMBOCOR) 100 MG tablet Take 1 tablet (100 mg total) by mouth 2 (two) times daily. 270 tablet 1   fluticasone (FLONASE) 50 MCG/ACT nasal spray Place 1 spray into both nostrils daily.     GLUCOSAMINE-CHONDROITIN PO Take 1,500 mg by mouth daily.     influenza vaccine adjuvanted (FLUAD QUADRIVALENT) 0.5 ML injection Inject into the muscle. 0.5 mL 0   Mometasone Furoate (ASMANEX HFA) 100 MCG/ACT AERO Inhale 1 puff into the lungs every evening.     montelukast (SINGULAIR) 10 MG tablet Take 10 mg by mouth at bedtime.     Naphazoline-Pheniramine (OPCON-A) 0.027-0.315 % SOLN Place 1 drop into both eyes 2 (two) times daily as needed (eye allergies (red/irritated eyes)).     NON FORMULARY Allergy Shots every 3 weeks     omeprazole (PRILOSEC) 20 MG capsule Take 20 mg by mouth 2 (two) times daily before a meal.      Probiotic Product (ALIGN) 4 MG CAPS Take 4 mg by mouth every evening.     psyllium (METAMUCIL) 58.6 % powder Take 1 packet by mouth 2 (two) times daily.     VENTOLIN HFA 108 (90 Base) MCG/ACT inhaler Inhale 1-2 puffs into the lungs every 6 (six) hours as needed for wheezing or shortness of breath.     No current facility-administered medications for this encounter.    No Known  Allergies  Social History   Socioeconomic History   Marital status: Married    Spouse name: Romie Minus   Number of children: 2   Years of education: Not on file   Highest education level: Not on file  Occupational History   Occupation: Immunologist [employer]   Occupation: Optometrist  Tobacco Use   Smoking status: Former    Packs/day: 1.00    Years: 10.00    Total pack years: 10.00    Types: Cigarettes    Quit date: 1972    Years since quitting: 52.0   Smokeless tobacco: Never   Tobacco comments:    Former smoker 03/15/22  Vaping Use   Vaping Use: Never used  Substance and Sexual Activity   Alcohol use: Not Currently   Drug use: No   Sexual activity: Not on file  Other Topics Concern   Not on file  Social History Narrative   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  Consultant for schools.   Travels over seas    Social Determinants of Health   Financial Resource Strain: Not on file  Food Insecurity: No Food Insecurity (11/28/2021)   Hunger Vital Sign    Worried About Running Out of Food in the Last Year: Never true    Ran Out of Food in the Last Year: Never true  Transportation Needs: No Transportation Needs (11/28/2021)   PRAPARE - Hydrologist (Medical): No  Lack of Transportation (Non-Medical): No  Physical Activity: Not on file  Stress: Not on file  Social Connections: Not on file  Intimate Partner Violence: Not At Risk (11/28/2021)   Humiliation, Afraid, Rape, and Kick questionnaire    Fear of Current or Ex-Partner: No    Emotionally Abused: No    Physically Abused: No    Sexually Abused: No    Family History  Problem Relation Age of Onset   Emphysema Father    Lung cancer Father    Hypertension Mother    Heart disease Mother        heart attack   Arthritis Mother    Diabetes Maternal Grandmother    Heart disease Maternal Grandfather        Died age 47   Liver cancer Paternal Grandmother    Heart disease Paternal Grandfather        Died age 29    Atrial fibrillation Son    Stomach cancer Neg Hx    Rectal cancer Neg Hx    Esophageal cancer Neg Hx    Colon cancer Neg Hx     ROS- All systems are reviewed and negative except as per the HPI above  Physical Exam: Vitals:   03/15/22 1124  BP: 130/78  Pulse: (!) 54  Weight: 97.1 kg  Height: '5\' 10"'$  (1.778 m)   Wt Readings from Last 3 Encounters:  03/15/22 97.1 kg  01/20/22 95.6 kg  12/14/21 93.9 kg    Labs: Lab Results  Component Value Date   NA 137 12/06/2021   K 4.3 12/06/2021   CL 103 12/06/2021   CO2 27 12/06/2021   GLUCOSE 91 12/06/2021   BUN 11 12/06/2021   CREATININE 1.03 12/06/2021   CALCIUM 9.0 12/06/2021   PHOS 2.8 11/29/2021   MG 2.1 11/29/2021   Lab Results  Component Value Date   INR 1.04 02/11/2018   No results found for: "CHOL", "HDL", "LDLCALC", "TRIG"   GEN- The patient is well appearing, alert and oriented x 3 today.   Head- normocephalic, atraumatic Eyes-  Sclera clear, conjunctiva pink Ears- hearing intact Oropharynx- clear Neck- supple, no JVP Lymph- no cervical lymphadenopathy Lungs- Clear to ausculation bilaterally, normal work of breathing Heart regular rate and rhythm, no murmurs, rubs or gallops, PMI not laterally displaced GI- soft, NT, ND, + BS Extremities- no clubbing, cyanosis, or edema MS- no significant deformity or atrophy Skin- no rash or lesion Psych- euthymic mood, full affect Neuro- strength and sensation are intact  EKG- Vent. rate 54 BPM PR interval 190 ms QRS duration 104 ms QT/QTcB 412/390 ms P-R-T axes 63 6 48 Sinus bradycardia Otherwise normal ECG When compared with ECG of 14-Dec-2021 12:32, PREVIOUS ECG IS PRESENT  Assessment and Plan: 1. Paroxysmal afib S/p ablation 03/2018, repeat ablation 11/11/19 and 12/14/21 Minimal breakthrough afib with flecainide and metoprolol If his afib burden escalates, he may be interested in changing over to tikosyn   2. Chadsvasc score of 3  Continue  eliquis 5 mg  bid    F/u with Dr Curt Bears 04/04/21  Geroge Baseman. Meyer Arora, [facility] [address] [place], [place] [address] [phone]

## 2022-03-22 ENCOUNTER — Ambulatory Visit: Payer: Medicare Other | Admitting: Cardiology

## 2022-03-28 DIAGNOSIS — J3081 Allergic rhinitis due to animal (cat) (dog) hair and dander: Secondary | ICD-10-CM | POA: Diagnosis not present

## 2022-03-28 DIAGNOSIS — J3089 Other allergic rhinitis: Secondary | ICD-10-CM | POA: Diagnosis not present

## 2022-03-28 DIAGNOSIS — J301 Allergic rhinitis due to pollen: Secondary | ICD-10-CM | POA: Diagnosis not present

## 2022-04-04 ENCOUNTER — Encounter: Payer: Self-pay | Admitting: Cardiology

## 2022-04-04 ENCOUNTER — Ambulatory Visit: Payer: Medicare Other | Attending: Cardiology | Admitting: Cardiology

## 2022-04-04 VITALS — BP 124/66 | HR 54 | Ht 70.0 in | Wt 218.0 lb

## 2022-04-04 DIAGNOSIS — D6869 Other thrombophilia: Secondary | ICD-10-CM | POA: Diagnosis not present

## 2022-04-04 DIAGNOSIS — J301 Allergic rhinitis due to pollen: Secondary | ICD-10-CM | POA: Diagnosis not present

## 2022-04-04 DIAGNOSIS — Z79899 Other long term (current) drug therapy: Secondary | ICD-10-CM

## 2022-04-04 DIAGNOSIS — I480 Paroxysmal atrial fibrillation: Secondary | ICD-10-CM | POA: Diagnosis not present

## 2022-04-04 DIAGNOSIS — J3081 Allergic rhinitis due to animal (cat) (dog) hair and dander: Secondary | ICD-10-CM | POA: Diagnosis not present

## 2022-04-04 DIAGNOSIS — J3089 Other allergic rhinitis: Secondary | ICD-10-CM | POA: Diagnosis not present

## 2022-04-04 NOTE — Patient Instructions (Signed)
Medication Instructions:  Your physician recommends that you continue on your current medications as directed. Please refer to the Current Medication list given to you today.  *If you need a refill on your cardiac medications before your next appointment, please call your pharmacy*   Lab Work: None ordered  Testing/Procedures: None ordered   Follow-Up: At [facility], you and your health needs are our priority.  As part of our continuing mission to provide you with exceptional heart care, we have created designated Provider Care Teams.  These Care Teams include your primary Cardiologist (physician) and Advanced Practice Providers (APPs -  Physician Assistants and Nurse Practitioners) who all work together to provide you with the care you need, when you need it.  Your next appointment:   6 month(s)  The format for your next appointment:   In Person  Provider:   Will Camnitz, MD    Thank you for choosing [facility]!!   Kamiya Acord, RN [phone]   Other Instructions     

## 2022-04-04 NOTE — Progress Notes (Addendum)
Electrophysiology Office Note   Date:  04/04/2022   ID:  Brandon Mosher., DOB 1945/09/12, MRN [id number]  PCP:  [name], MD  Cardiologist:  Allred Primary Electrophysiologist:  Adelita Hone Meredith Leeds, MD    Chief Complaint: AF   History of Present Illness: Brandon Farley Ly. is a 77 y.o. male who is being seen today for the evaluation of AF at the request of [name], MD. Presenting today for electrophysiology evaluation.  He has a history significant for atrial fibrillation, hyperlipidemia, ectatic aorta.  He is post ablation x 3 on 019 2020, 11/11/2019, 12/14/2021.  He is currently on flecainide 100 mg twice daily.  Today, denies symptoms of palpitations, chest pain, shortness of breath, orthopnea, PND, lower extremity edema, claudication, dizziness, presyncope, syncope, bleeding, or neurologic sequela. The patient is tolerating medications without difficulties.  He has been doing well since ablation.  He has had a few episodes of atrial fibrillation, but this was immediately post ablation.  His longest episode lasted for 15 hours.  He is quite comfortable with his control.    Past Medical History:  Diagnosis Date   Aortic atherosclerosis ([place])    Arthritis    Asthma    Diverticulitis    Diverticulosis    DVT (deep venous thrombosis) ([place]) 2019   right leg after back surgery   Dysrhythmia    A-fib every few weeks   Ectatic thoracic aorta ([place])    a. 4.3cm by CT 03/2018.   GERD (gastroesophageal reflux disease)    Hemorrhoids    History of echocardiogram    a. Echo 10/16 ([facility]):  EF 55%, trace MR, mild TR, mild to mod AI, mild dilated Ao root (41 mm)   Hyperlipemia    Organic erectile dysfunction    PAF (paroxysmal atrial fibrillation) ([place])    a. admx to [facility] in [place], [place] 10/16 with AF with RVR >> converted to NSR with IV Dilt;  b. Flecainide started >> ETT neg for pro-arrhythmia    Pneumonia 06/2020   Prostate cancer  ([place]) 04/25/2006   Gleason 3+4=7   Prostatitis    S/P cardiac cath    a. LHC at [facility] 10/16:  LM ok, mLAD 30%, LCx ok, dRCA 20%   S/P radiation therapy 01/19/2014 through 03/05/2014                                                      Prostate bed 6600 cGy in 33 sessions                           Torn rotator cuff    right  40% tear   Past Surgical History:  Procedure Laterality Date   ABLATION OF DYSRHYTHMIC FOCUS  03/28/2018   APPENDECTOMY  1967   ATRIAL FIBRILLATION ABLATION N/A 03/28/2018   Procedure: ATRIAL FIBRILLATION ABLATION;  Surgeon: [name], MD;  Location: [facility];  Service: Cardiovascular;  Laterality: N/A;   ATRIAL FIBRILLATION ABLATION N/A 11/11/2019   Procedure: ATRIAL FIBRILLATION ABLATION;  Surgeon: [name], MD;  Location: [facility];  Service: Cardiovascular;  Laterality: N/A;   ATRIAL FIBRILLATION ABLATION N/A 12/14/2021   Procedure: ATRIAL FIBRILLATION ABLATION;  Surgeon: Constance Haw,  MD;  Location: [facility];  Service: Cardiovascular;  Laterality: N/A;   BACK SURGERY  2019   CARDIAC CATHETERIZATION  2016   [place]   CATARACT EXTRACTION W/ INTRAOCULAR LENS IMPLANT Left 11/2011   CATARACT EXTRACTION W/ INTRAOCULAR LENS IMPLANT Right 09/2015   COLONOSCOPY     LARYNX SURGERY  2000   vocal cord lesion- benign   LUMBAR LAMINECTOMY/DECOMPRESSION MICRODISCECTOMY Right 07/26/2017   Procedure: RIGHT LUMBAR 3- LUMBAR 4 FORAMINOTOMY WITH RESECTION OF SYNOVIAL CYST;  Surgeon: Erline Levine, MD;  Location: [facility];  Service: Neurosurgery;  Laterality: Right;  RIGHT LUMBAR 3- LUMBAR 4 FORAMINOTOMY WITH RESECTION OF SYNOVIAL CYST   MENISCUS REPAIR Right    POLYPECTOMY     PROSTATE BIOPSY  1999   PROSTATECTOMY  04/25/2006   REVERSE SHOULDER ARTHROPLASTY Left 09/02/2020   Procedure: REVERSE SHOULDER ARTHROPLASTY;  Surgeon: [name], MD;  [facility];  Service: Orthopedics;  Laterality: Left;  122mn     Current  Outpatient Medications  Medication Sig Dispense Refill   acetaminophen (TYLENOL) 500 MG tablet Take 500 mg by mouth 2 (two) times daily as needed.     apixaban (ELIQUIS) 5 MG TABS tablet Take 5 mg by mouth 2 (two) times daily.      atorvastatin (LIPITOR) 20 MG tablet Take 20 mg by mouth daily.     diltiazem (CARDIZEM CD) 180 MG 24 hr capsule TAKE 1 CAPSULE (180 MG TOTAL) BY MOUTH DAILY IN THE AFTERNOON. 90 capsule 3   diltiazem (CARDIZEM) 30 MG tablet TAKE 1 TABLET EVERY 4 HOURS AS NEEDED FOR AFIB RAPID HEART RATE OVER 100 540 tablet 1   EPINEPHrine 0.3 mg/0.3 mL IJ SOAJ injection Inject 0.3 mg into the muscle as needed for anaphylaxis.     fexofenadine (ALLEGRA ALLERGY) 180 MG tablet Take 1 tablet by mouth daily.     flecainide (TAMBOCOR) 100 MG tablet Take 1 tablet (100 mg total) by mouth 2 (two) times daily. 270 tablet 1   fluticasone (FLONASE) 50 MCG/ACT nasal spray Place 1 spray into both nostrils daily.     GLUCOSAMINE-CHONDROITIN PO Take 1,500 mg by mouth daily.     influenza vaccine adjuvanted (FLUAD QUADRIVALENT) 0.5 ML injection Inject into the muscle. 0.5 mL 0   Mometasone Furoate (ASMANEX HFA) 100 MCG/ACT AERO Inhale 1 puff into the lungs every evening.     montelukast (SINGULAIR) 10 MG tablet Take 10 mg by mouth at bedtime.     Naphazoline-Pheniramine (OPCON-A) 0.027-0.315 % SOLN Place 1 drop into both eyes 2 (two) times daily as needed (eye allergies (red/irritated eyes)).     NON FORMULARY Allergy Shots every 3 weeks     omeprazole (PRILOSEC) 20 MG capsule Take 20 mg by mouth 2 (two) times daily before a meal.      Probiotic Product (ALIGN) 4 MG CAPS Take 4 mg by mouth every evening.     psyllium (METAMUCIL) 58.6 % powder Take 1 packet by mouth 2 (two) times daily.     VENTOLIN HFA 108 (90 Base) MCG/ACT inhaler Inhale 1-2 puffs into the lungs every 6 (six) hours as needed for wheezing or shortness of breath.     No current facility-administered medications for this visit.     Allergies:   Patient has no known allergies.   Social History:  The patient  reports that he quit smoking about 52 years ago. His smoking use included cigarettes. He has a 10.00 pack-year smoking history. He has never used  smokeless tobacco. He reports that he does not currently use alcohol. He reports that he does not use drugs.   Family History:  The patient's family history includes Arthritis in his mother; Atrial fibrillation in his son; Diabetes in his maternal grandmother; Emphysema in his father; Heart disease in his maternal grandfather, mother, and paternal grandfather; Hypertension in his mother; Liver cancer in his paternal grandmother; Lung cancer in his father.   ROS:  Please see the history of present illness.   Otherwise, review of systems is positive for none.   All other systems are reviewed and negative.   PHYSICAL EXAM: VS:  BP 124/66   Pulse (!) 54   Ht '5\' 10"'$  (1.778 m)   Wt 218 lb (98.9 kg)   SpO2 97%   BMI 31.28 kg/m  , BMI Body mass index is 31.28 kg/m. GEN: Well nourished, well developed, in no acute distress  HEENT: normal  Neck: no JVD, carotid bruits, or masses Cardiac: RRR; no murmurs, rubs, or gallops,no edema  Respiratory:  clear to auscultation bilaterally, normal work of breathing GI: soft, nontender, nondistended, + BS MS: no deformity or atrophy  Skin: warm and dry Neuro:  Strength and sensation are intact Psych: euthymic mood, full affect  EKG:  EKG is ordered today. Personal review of the ekg ordered shows sinus rhythm  Recent Labs: 11/27/2021: ALT 31 11/29/2021: Magnesium 2.1 12/06/2021: BUN 11; Creatinine, Ser 1.03; Hemoglobin 11.9; Platelets 344; Potassium 4.3; Sodium 137    Lipid Panel  No results found for: "CHOL", "TRIG", "HDL", "CHOLHDL", "VLDL", "LDLCALC", "LDLDIRECT"   Wt Readings from Last 3 Encounters:  04/04/22 218 lb (98.9 kg)  03/15/22 214 lb (97.1 kg)  01/20/22 210 lb 12.8 oz (95.6 kg)      Other studies  Reviewed: Additional studies/ records that were reviewed today include: TTE 08/24/18  Review of the above records today demonstrates:   1. The left ventricle has normal systolic function with an ejection  fraction of 60-65%. The cavity size was normal. Left ventricular diastolic  Doppler parameters are consistent with pseudonormalization.   2. The right ventricle has normal systolic function. The cavity was  normal. There is no increase in right ventricular wall thickness.   3. No evidence of mitral valve stenosis.   4. Aortic valve regurgitation is mild by color flow Doppler. No stenosis  of the aortic valve.   5. There is mild dilatation of the aortic root and of the ascending  aorta.   6. The interatrial septum was not assessed.    ASSESSMENT AND PLAN:  1.  Paroxysmal atrial fibrillation: Status post ablation January 2020, repeat ablation 11/11/2019, 12/14/2021.  Currently on flecainide 100 mg twice daily, diltiazem 180 mg daily, Eliquis 5 mg twice daily.  CHA2DS2-VASc of 3.  He has remained in sinus rhythm since his ablation with only a few episodes of atrial fibrillation.  Brandon Simmons continue with current management.  2.  Second hypercoagulable state: Currently on Eliquis for atrial fibrillation as above  3.  High risk medication monitoring: Currently on flecainide for atrial fibrillation as above.  QRS remains narrow.  Current medicines are reviewed at length with the patient today.   The patient does not have concerns regarding his medicines.  The following changes were made today: None  Labs/ tests ordered today include:  Orders Placed This Encounter  Procedures   EKG 12-Lead     Disposition:   FU 6 months  Signed, Brandon Beckstead Meredith Leeds, MD  04/04/2022 11:04  AM     [facility] [address] [place] [address] [phone] (office) [phone] (fax)

## 2022-04-07 ENCOUNTER — Encounter: Payer: Self-pay | Admitting: Cardiology

## 2022-04-10 DIAGNOSIS — J3089 Other allergic rhinitis: Secondary | ICD-10-CM | POA: Diagnosis not present

## 2022-04-10 DIAGNOSIS — J301 Allergic rhinitis due to pollen: Secondary | ICD-10-CM | POA: Diagnosis not present

## 2022-04-10 DIAGNOSIS — B999 Unspecified infectious disease: Secondary | ICD-10-CM | POA: Diagnosis not present

## 2022-04-10 DIAGNOSIS — J4530 Mild persistent asthma, uncomplicated: Secondary | ICD-10-CM | POA: Diagnosis not present

## 2022-04-11 DIAGNOSIS — C61 Malignant neoplasm of prostate: Secondary | ICD-10-CM | POA: Diagnosis not present

## 2022-04-11 DIAGNOSIS — J3081 Allergic rhinitis due to animal (cat) (dog) hair and dander: Secondary | ICD-10-CM | POA: Diagnosis not present

## 2022-04-11 DIAGNOSIS — J3089 Other allergic rhinitis: Secondary | ICD-10-CM | POA: Diagnosis not present

## 2022-04-11 DIAGNOSIS — J301 Allergic rhinitis due to pollen: Secondary | ICD-10-CM | POA: Diagnosis not present

## 2022-04-18 DIAGNOSIS — C61 Malignant neoplasm of prostate: Secondary | ICD-10-CM | POA: Diagnosis not present

## 2022-04-26 ENCOUNTER — Other Ambulatory Visit (HOSPITAL_COMMUNITY): Payer: Self-pay | Admitting: Urology

## 2022-04-26 DIAGNOSIS — C61 Malignant neoplasm of prostate: Secondary | ICD-10-CM

## 2022-04-26 DIAGNOSIS — R9721 Rising PSA following treatment for malignant neoplasm of prostate: Secondary | ICD-10-CM

## 2022-04-27 ENCOUNTER — Encounter (HOSPITAL_COMMUNITY): Payer: Self-pay | Admitting: *Deleted

## 2022-04-27 DIAGNOSIS — J301 Allergic rhinitis due to pollen: Secondary | ICD-10-CM | POA: Diagnosis not present

## 2022-04-27 DIAGNOSIS — J3081 Allergic rhinitis due to animal (cat) (dog) hair and dander: Secondary | ICD-10-CM | POA: Diagnosis not present

## 2022-04-27 DIAGNOSIS — J3089 Other allergic rhinitis: Secondary | ICD-10-CM | POA: Diagnosis not present

## 2022-05-02 DIAGNOSIS — J301 Allergic rhinitis due to pollen: Secondary | ICD-10-CM | POA: Diagnosis not present

## 2022-05-02 DIAGNOSIS — J3089 Other allergic rhinitis: Secondary | ICD-10-CM | POA: Diagnosis not present

## 2022-05-02 DIAGNOSIS — J3081 Allergic rhinitis due to animal (cat) (dog) hair and dander: Secondary | ICD-10-CM | POA: Diagnosis not present

## 2022-05-09 DIAGNOSIS — J3081 Allergic rhinitis due to animal (cat) (dog) hair and dander: Secondary | ICD-10-CM | POA: Diagnosis not present

## 2022-05-09 DIAGNOSIS — J3089 Other allergic rhinitis: Secondary | ICD-10-CM | POA: Diagnosis not present

## 2022-05-09 DIAGNOSIS — J301 Allergic rhinitis due to pollen: Secondary | ICD-10-CM | POA: Diagnosis not present

## 2022-05-12 ENCOUNTER — Ambulatory Visit (HOSPITAL_COMMUNITY)
Admission: RE | Admit: 2022-05-12 | Discharge: 2022-05-12 | Disposition: A | Discharge status: Home / Self Care | Payer: Medicare Other | Source: Ambulatory Visit | Attending: Urology | Admitting: Urology

## 2022-05-12 DIAGNOSIS — R9721 Rising PSA following treatment for malignant neoplasm of prostate: Secondary | ICD-10-CM | POA: Insufficient documentation

## 2022-05-12 DIAGNOSIS — C61 Malignant neoplasm of prostate: Secondary | ICD-10-CM | POA: Diagnosis not present

## 2022-05-12 DIAGNOSIS — K7689 Other specified diseases of liver: Secondary | ICD-10-CM | POA: Diagnosis not present

## 2022-05-12 MED ORDER — PIFLIFOLASTAT F 18 (PYLARIFY) INJECTION
9.0000 | Freq: Once | INTRAVENOUS | Status: AC
  Administered 2022-05-12: 8.7 via INTRAVENOUS

## 2022-05-25 DIAGNOSIS — J301 Allergic rhinitis due to pollen: Secondary | ICD-10-CM | POA: Diagnosis not present

## 2022-05-25 DIAGNOSIS — J3089 Other allergic rhinitis: Secondary | ICD-10-CM | POA: Diagnosis not present

## 2022-05-25 DIAGNOSIS — J3081 Allergic rhinitis due to animal (cat) (dog) hair and dander: Secondary | ICD-10-CM | POA: Diagnosis not present

## 2022-06-26 ENCOUNTER — Other Ambulatory Visit (HOSPITAL_COMMUNITY): Payer: Self-pay | Admitting: Internal Medicine

## 2022-06-26 ENCOUNTER — Ambulatory Visit (HOSPITAL_COMMUNITY)
Admission: RE | Admit: 2022-06-26 | Discharge: 2022-06-26 | Disposition: A | Discharge status: Home / Self Care | Payer: Medicare Other | Source: Ambulatory Visit | Attending: Internal Medicine | Admitting: Internal Medicine

## 2022-06-26 DIAGNOSIS — M7122 Synovial cyst of popliteal space [Baker], left knee: Secondary | ICD-10-CM | POA: Diagnosis not present

## 2022-06-26 DIAGNOSIS — M7989 Other specified soft tissue disorders: Secondary | ICD-10-CM

## 2022-06-26 DIAGNOSIS — I4891 Unspecified atrial fibrillation: Secondary | ICD-10-CM | POA: Diagnosis not present

## 2022-06-26 DIAGNOSIS — M79605 Pain in left leg: Secondary | ICD-10-CM | POA: Diagnosis not present

## 2022-06-27 DIAGNOSIS — J3089 Other allergic rhinitis: Secondary | ICD-10-CM | POA: Diagnosis not present

## 2022-06-27 DIAGNOSIS — J301 Allergic rhinitis due to pollen: Secondary | ICD-10-CM | POA: Diagnosis not present

## 2022-06-27 DIAGNOSIS — J3081 Allergic rhinitis due to animal (cat) (dog) hair and dander: Secondary | ICD-10-CM | POA: Diagnosis not present

## 2022-06-28 DIAGNOSIS — M79662 Pain in left lower leg: Secondary | ICD-10-CM | POA: Diagnosis not present

## 2022-06-30 DIAGNOSIS — M1712 Unilateral primary osteoarthritis, left knee: Secondary | ICD-10-CM | POA: Diagnosis not present

## 2022-06-30 DIAGNOSIS — M7122 Synovial cyst of popliteal space [Baker], left knee: Secondary | ICD-10-CM | POA: Diagnosis not present

## 2022-07-06 ENCOUNTER — Other Ambulatory Visit: Payer: Self-pay | Admitting: Cardiology

## 2022-07-07 DIAGNOSIS — Z23 Encounter for immunization: Secondary | ICD-10-CM | POA: Diagnosis not present

## 2022-07-19 DIAGNOSIS — C61 Malignant neoplasm of prostate: Secondary | ICD-10-CM | POA: Diagnosis not present

## 2022-07-20 DIAGNOSIS — J3089 Other allergic rhinitis: Secondary | ICD-10-CM | POA: Diagnosis not present

## 2022-07-20 DIAGNOSIS — J301 Allergic rhinitis due to pollen: Secondary | ICD-10-CM | POA: Diagnosis not present

## 2022-07-26 DIAGNOSIS — C61 Malignant neoplasm of prostate: Secondary | ICD-10-CM | POA: Diagnosis not present

## 2022-08-01 DIAGNOSIS — Z23 Encounter for immunization: Secondary | ICD-10-CM | POA: Diagnosis not present

## 2022-08-10 DIAGNOSIS — J301 Allergic rhinitis due to pollen: Secondary | ICD-10-CM | POA: Diagnosis not present

## 2022-08-10 DIAGNOSIS — J3089 Other allergic rhinitis: Secondary | ICD-10-CM | POA: Diagnosis not present

## 2022-09-01 DIAGNOSIS — J301 Allergic rhinitis due to pollen: Secondary | ICD-10-CM | POA: Diagnosis not present

## 2022-09-01 DIAGNOSIS — J3081 Allergic rhinitis due to animal (cat) (dog) hair and dander: Secondary | ICD-10-CM | POA: Diagnosis not present

## 2022-09-01 DIAGNOSIS — J3089 Other allergic rhinitis: Secondary | ICD-10-CM | POA: Diagnosis not present

## 2022-09-18 DIAGNOSIS — D2371 Other benign neoplasm of skin of right lower limb, including hip: Secondary | ICD-10-CM | POA: Diagnosis not present

## 2022-09-18 DIAGNOSIS — L03011 Cellulitis of right finger: Secondary | ICD-10-CM | POA: Diagnosis not present

## 2022-09-18 DIAGNOSIS — L814 Other melanin hyperpigmentation: Secondary | ICD-10-CM | POA: Diagnosis not present

## 2022-09-18 DIAGNOSIS — L821 Other seborrheic keratosis: Secondary | ICD-10-CM | POA: Diagnosis not present

## 2022-09-18 DIAGNOSIS — D2261 Melanocytic nevi of right upper limb, including shoulder: Secondary | ICD-10-CM | POA: Diagnosis not present

## 2022-09-18 DIAGNOSIS — D225 Melanocytic nevi of trunk: Secondary | ICD-10-CM | POA: Diagnosis not present

## 2022-09-19 DIAGNOSIS — I10 Essential (primary) hypertension: Secondary | ICD-10-CM | POA: Diagnosis not present

## 2022-09-19 DIAGNOSIS — C61 Malignant neoplasm of prostate: Secondary | ICD-10-CM | POA: Diagnosis not present

## 2022-09-19 DIAGNOSIS — E785 Hyperlipidemia, unspecified: Secondary | ICD-10-CM | POA: Diagnosis not present

## 2022-09-26 DIAGNOSIS — Z0000 Encounter for general adult medical examination without abnormal findings: Secondary | ICD-10-CM | POA: Diagnosis not present

## 2022-09-26 DIAGNOSIS — R82998 Other abnormal findings in urine: Secondary | ICD-10-CM | POA: Diagnosis not present

## 2022-09-26 DIAGNOSIS — Z7901 Long term (current) use of anticoagulants: Secondary | ICD-10-CM | POA: Diagnosis not present

## 2022-09-26 DIAGNOSIS — J302 Other seasonal allergic rhinitis: Secondary | ICD-10-CM | POA: Diagnosis not present

## 2022-09-26 DIAGNOSIS — I2510 Atherosclerotic heart disease of native coronary artery without angina pectoris: Secondary | ICD-10-CM | POA: Diagnosis not present

## 2022-09-26 DIAGNOSIS — E669 Obesity, unspecified: Secondary | ICD-10-CM | POA: Diagnosis not present

## 2022-09-26 DIAGNOSIS — I4891 Unspecified atrial fibrillation: Secondary | ICD-10-CM | POA: Diagnosis not present

## 2022-09-26 DIAGNOSIS — Z1339 Encounter for screening examination for other mental health and behavioral disorders: Secondary | ICD-10-CM | POA: Diagnosis not present

## 2022-09-26 DIAGNOSIS — I10 Essential (primary) hypertension: Secondary | ICD-10-CM | POA: Diagnosis not present

## 2022-09-26 DIAGNOSIS — J45909 Unspecified asthma, uncomplicated: Secondary | ICD-10-CM | POA: Diagnosis not present

## 2022-09-26 DIAGNOSIS — E785 Hyperlipidemia, unspecified: Secondary | ICD-10-CM | POA: Diagnosis not present

## 2022-09-26 DIAGNOSIS — C61 Malignant neoplasm of prostate: Secondary | ICD-10-CM | POA: Diagnosis not present

## 2022-09-26 DIAGNOSIS — Z1331 Encounter for screening for depression: Secondary | ICD-10-CM | POA: Diagnosis not present

## 2022-09-27 DIAGNOSIS — J301 Allergic rhinitis due to pollen: Secondary | ICD-10-CM | POA: Diagnosis not present

## 2022-09-27 DIAGNOSIS — J3089 Other allergic rhinitis: Secondary | ICD-10-CM | POA: Diagnosis not present

## 2022-09-27 DIAGNOSIS — J3081 Allergic rhinitis due to animal (cat) (dog) hair and dander: Secondary | ICD-10-CM | POA: Diagnosis not present

## 2022-10-20 DIAGNOSIS — J3089 Other allergic rhinitis: Secondary | ICD-10-CM | POA: Diagnosis not present

## 2022-10-20 DIAGNOSIS — J3081 Allergic rhinitis due to animal (cat) (dog) hair and dander: Secondary | ICD-10-CM | POA: Diagnosis not present

## 2022-10-20 DIAGNOSIS — J301 Allergic rhinitis due to pollen: Secondary | ICD-10-CM | POA: Diagnosis not present

## 2022-10-30 DIAGNOSIS — I10 Essential (primary) hypertension: Secondary | ICD-10-CM | POA: Diagnosis not present

## 2022-10-30 DIAGNOSIS — J189 Pneumonia, unspecified organism: Secondary | ICD-10-CM | POA: Diagnosis not present

## 2022-10-31 DIAGNOSIS — C61 Malignant neoplasm of prostate: Secondary | ICD-10-CM | POA: Diagnosis not present

## 2022-11-06 ENCOUNTER — Ambulatory Visit: Payer: BLUE CROSS/BLUE SHIELD | Admitting: Pulmonary Disease

## 2022-11-07 ENCOUNTER — Ambulatory Visit: Payer: BLUE CROSS/BLUE SHIELD | Admitting: Cardiology

## 2022-11-16 DIAGNOSIS — J3089 Other allergic rhinitis: Secondary | ICD-10-CM | POA: Diagnosis not present

## 2022-11-16 DIAGNOSIS — J301 Allergic rhinitis due to pollen: Secondary | ICD-10-CM | POA: Diagnosis not present

## 2022-11-16 DIAGNOSIS — J3081 Allergic rhinitis due to animal (cat) (dog) hair and dander: Secondary | ICD-10-CM | POA: Diagnosis not present

## 2022-11-17 DIAGNOSIS — Z23 Encounter for immunization: Secondary | ICD-10-CM | POA: Diagnosis not present

## 2022-12-01 ENCOUNTER — Encounter: Payer: Self-pay | Admitting: Cardiology

## 2022-12-13 DIAGNOSIS — J301 Allergic rhinitis due to pollen: Secondary | ICD-10-CM | POA: Diagnosis not present

## 2022-12-13 DIAGNOSIS — J3081 Allergic rhinitis due to animal (cat) (dog) hair and dander: Secondary | ICD-10-CM | POA: Diagnosis not present

## 2022-12-13 DIAGNOSIS — J3089 Other allergic rhinitis: Secondary | ICD-10-CM | POA: Diagnosis not present

## 2022-12-20 ENCOUNTER — Ambulatory Visit (INDEPENDENT_AMBULATORY_CARE_PROVIDER_SITE_OTHER): Payer: Medicare Other | Admitting: Pulmonary Disease

## 2022-12-20 ENCOUNTER — Encounter: Payer: Self-pay | Admitting: Pulmonary Disease

## 2022-12-20 VITALS — BP 124/80 | HR 63 | Ht 70.0 in | Wt 221.0 lb

## 2022-12-20 DIAGNOSIS — J4530 Mild persistent asthma, uncomplicated: Secondary | ICD-10-CM | POA: Diagnosis not present

## 2022-12-20 NOTE — Progress Notes (Signed)
Brandon Simmons    [id number]    [birth year]  Primary Care Physician:Paterson, Barry Dienes, MD  Referring Physician: Garlan Fillers, MD [address] [place],  [place] [address]   Chief complaint: Follow-up mild asthma  HPI: 77 year old with history of arrhythmias, asthma, prostate cancer, DVT Developed community-acquired pneumonia in mid April.  Symptoms started off with a upper respiratory viral infection and proceeded to cough, fevers of 102.  He was evaluated in the ED on 4/22 and given amoxicillin with no improvement.  He was reevaluated on 4/24 and doxycycline added.  He got a round of prednisone as well.  He was given breztri from his primary care doctor but had to stop after few days as he developed sore throat and hoarseness.  He then got levofloxacin and prednisone for 7 days.  Unfortunately developed atrial fibrillation transiently while on prednisone but converted back to sinus rhythm.  Overall improved with this treatment  History notable for mild asthma for which he sees Dr. Gary Fleet at allergy center.  Treated with Asmanex inhaler, albuterol. He also had a DVT in 2019 in the setting of surgery and was treated with anticoagulants  Developed COVID-19 in October 2022 after a trip to [place].  Treated with Molnupiravir but has persistent cough since then.    Admitted to [facility] in September 2013 for community-acquired pneumonia with right upper lobe and middle lobe infiltrate.  Treated with Rocephin and doxycycline with negative cultures.  Switch to [facility] on discharge for total of 7 days of therapy.  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  No feather pillows or comforters Smoking history: Minimal smoking history in his 68s Travel history: No significant travel history Relevant  family history: No family history of lung disease  Interim history: Discussed the use of AI scribe software for clinical note transcription with the patient, who gave verbal consent to proceed.  The patient, with a history of asthma, presents for a routine follow-up. He reports doing 'very well' and has not had an asthma attack for 'maybe a couple of years, maybe more.' He attributes his improvement to the combined efforts of his primary care provider and his allergist, Dr. Gary Fleet. His current regimen includes Asmanex, Singulair, and three nasal sprays: Flonase, azelastine, and ipatropium. He hardly ever needs to use his rescue inhaler. The patient had a bout of pneumonia in the past, which was severe, but has since recovered.      Outpatient Encounter Medications as of 12/20/2022  Medication Sig   acetaminophen (TYLENOL) 500 MG tablet Take 500 mg by mouth 2 (two) times daily as needed.   apixaban (ELIQUIS) 5 MG TABS tablet Take 5 mg by mouth 2 (two) times daily.    atorvastatin (LIPITOR) 20 MG tablet Take 20 mg by mouth daily.   Azelastine HCl 137 MCG/SPRAY SOLN 1 puff in each nostril Nasally Twice a day for 30 days   diltiazem (CARDIZEM CD) 180 MG 24 hr capsule TAKE 1 CAPSULE (180 MG TOTAL) BY MOUTH DAILY IN THE AFTERNOON.   diltiazem (CARDIZEM) 30 MG tablet TAKE 1 TABLET EVERY 4 HOURS AS NEEDED FOR AFIB RAPID HEART RATE OVER 100   EPINEPHrine 0.3 mg/0.3 mL IJ SOAJ injection Inject 0.3 mg into the muscle as needed for anaphylaxis.   fexofenadine (  ALLEGRA ALLERGY) 180 MG tablet Take 1 tablet by mouth daily.   flecainide (TAMBOCOR) 100 MG tablet TAKE 1.5 TABLETS BY MOUTH 2 TIMES DAILY.   fluticasone (FLONASE) 50 MCG/ACT nasal spray Place 1 spray into both nostrils daily.   GLUCOSAMINE-CHONDROITIN PO Take 1,500 mg by mouth daily.   influenza vaccine adjuvanted (FLUAD QUADRIVALENT) 0.5 ML injection Inject into the muscle.   ipratropium (ATROVENT) 0.06 % nasal spray Place 2 sprays into both nostrils  3 (three) times daily.   losartan (COZAAR) 50 MG tablet Take 50 mg by mouth daily.   Mometasone Furoate (ASMANEX HFA) 100 MCG/ACT AERO Inhale 1 puff into the lungs every evening.   montelukast (SINGULAIR) 10 MG tablet Take 10 mg by mouth at bedtime.   Naphazoline-Pheniramine (OPCON-A) 0.027-0.315 % SOLN Place 1 drop into both eyes 2 (two) times daily as needed (eye allergies (red/irritated eyes)).   NON FORMULARY Allergy Shots every 3 weeks   omeprazole (PRILOSEC) 20 MG capsule Take 20 mg by mouth 2 (two) times daily before a meal.    Probiotic Product (ALIGN) 4 MG CAPS Take 4 mg by mouth every evening.   psyllium (METAMUCIL) 58.6 % powder Take 1 packet by mouth 2 (two) times daily.   VENTOLIN HFA 108 (90 Base) MCG/ACT inhaler Inhale 1-2 puffs into the lungs every 6 (six) hours as needed for wheezing or shortness of breath.   No facility-administered encounter medications on file as of 12/20/2022.    Physical Exam: Blood pressure 124/80, pulse 63, height 5\' 10"  (1.778 m), weight 221 lb (100.2 kg), SpO2 97%. Gen:      No acute distress HEENT:  EOMI, sclera anicteric Neck:     No masses; no thyromegaly Lungs:    Clear to auscultation bilaterally; normal respiratory effort CV:         Regular rate and rhythm; no murmurs Abd:      + bowel sounds; soft, non-tender; no palpable masses, no distension Ext:    No edema; adequate peripheral perfusion Skin:      Warm and dry; no rash Neuro: alert and oriented x 3 Psych: normal mood and affect   Data Reviewed: Imaging: Chest x-ray 07/09/2020- minimal basal atelectasis Chest x-ray 07/11/2020- patchy bibasilar opacities left greater than right Chest x-ray report from primary care 08/24/2020-slight improvement in bilateral infiltrates PET scan 10/13/2020-pulmonary nodule in the left lower lobe with limited uptake.  Architectural distortion in the left lower lobe. Chest x-ray 02/17/2021-persistent left lower lobe opacity CT chest 02/25/2021- 4 mm nodule at  the left lung base.  New small nodules in the inferior lingula, ascending thoracic aortic aneurysm. Chest x-ray 11/27/2021- dense right upper lobe airspace disease Chest x-ray 11/29/2021- Improving right upper lobe opacity Chest x-ray 12/08/2021- Improving right upper lobe infiltrate Cardiac CT 12/08/2021-development of right upper and middle lobe airspace disease and groundglass opacities. Chest x-ray 01/20/2022-improvement in right upper lobe opacity. I have reviewed the images personally.  PFTs:   Labs:  Assessment:  Asthma Well controlled with current regimen. No recent asthma attacks. Medications include Asmanex (1 puff nightly), Flonase, Azelastine, Ipatropium (various doses daily), and Singulair. -Continue current regimen. -No need for rescue inhaler use. - Follow with Dr. Gary Fleet, Allergy  Follow-up Given the stable condition, no need for immediate follow-up. -Schedule annual follow-up appointment. -If any changes or concerns arise, patient to contact office for sooner appointment.    Plan/Reccmmendations: Continue Asmanex, Singulair  Follow-up in 1 year  Chilton Greathouse MD [facility] 12/20/2022,  2:40 PM  CC: Garlan Fillers, MD

## 2022-12-20 NOTE — Patient Instructions (Signed)
VISIT SUMMARY:  During your recent visit, we discussed your asthma, which you have managed very well with your current regimen. You have not had an asthma attack for a couple of years, which is a great achievement. Your current medications include Asmanex, Singulair, Flonase, azelastine, and ipatropium. You also mentioned that you hardly ever need to use your rescue inhaler.  YOUR PLAN:  -ASTHMA: Your asthma is well controlled with your current regimen. Asthma is a condition that makes breathing difficult and can cause coughing, wheezing, and shortness of breath. We will continue with your current medications, and there is no need for you to use your rescue inhaler at this time.  INSTRUCTIONS:  Given your stable condition, there is no need for an immediate follow-up. However, we should schedule an annual follow-up appointment. If any changes or concerns arise before then, please contact our office to schedule a sooner appointment.

## 2022-12-29 DIAGNOSIS — R310 Gross hematuria: Secondary | ICD-10-CM | POA: Diagnosis not present

## 2023-01-04 DIAGNOSIS — J3089 Other allergic rhinitis: Secondary | ICD-10-CM | POA: Diagnosis not present

## 2023-01-04 DIAGNOSIS — J301 Allergic rhinitis due to pollen: Secondary | ICD-10-CM | POA: Diagnosis not present

## 2023-01-04 DIAGNOSIS — J3081 Allergic rhinitis due to animal (cat) (dog) hair and dander: Secondary | ICD-10-CM | POA: Diagnosis not present

## 2023-01-07 DIAGNOSIS — Z23 Encounter for immunization: Secondary | ICD-10-CM | POA: Diagnosis not present

## 2023-01-10 ENCOUNTER — Encounter: Payer: Self-pay | Admitting: Cardiology

## 2023-01-10 ENCOUNTER — Ambulatory Visit: Payer: Medicare Other | Attending: Cardiology | Admitting: Cardiology

## 2023-01-10 VITALS — BP 112/81 | HR 130

## 2023-01-10 DIAGNOSIS — D6869 Other thrombophilia: Secondary | ICD-10-CM | POA: Insufficient documentation

## 2023-01-10 DIAGNOSIS — I480 Paroxysmal atrial fibrillation: Secondary | ICD-10-CM | POA: Insufficient documentation

## 2023-01-10 NOTE — Progress Notes (Signed)
Virtual Visit via Video Note   Because of Brandon BRAILYN TOPEL Jr.'s co-morbid illnesses, he is at least at moderate risk for complications without adequate follow up.  This format is felt to be most appropriate for this patient at this time.  All issues noted in this document were discussed and addressed.  A limited physical exam was performed with this format.  Please refer to the patient's chart for his consent to telehealth for [facility].       Date:  01/10/2023   ID:  Brandon Cousin., DOB 02-18-1946, MRN [id number] The patient was identified using 2 identifiers.  Patient Location: Home Provider Location: Office/Clinic   PCP:  Garlan Fillers, MD    [facility] Providers Cardiologist:  Rollene Rotunda, MD Electrophysiologist:  Taiya Nutting Jorja Loa, MD     Evaluation Performed:  Follow-Up Visit  Chief Complaint:  atrial fibrillation  History of Present Illness:    Brandon Simmons. is a 77 y.o. male with history of atrial fibrillation status post ablation.  He has had multiple ablations and multiple episodes of atrial fibrillation and is on flecainide.  Today, he is in atrial fibrillation with rapid rates.  He is going to take an as needed diltiazem.  He states that he feels much better with this current episode of atrial fibrillation than he did prior to his ablation.  He has some minor fatigue, but otherwise has no acute complaints.  He has been able to go out and have lunch with his wife.   Past Medical History:  Diagnosis Date   Aortic atherosclerosis (HCC)    Arthritis    Asthma    Diverticulitis    Diverticulosis    DVT (deep venous thrombosis) (HCC) 2019   right leg after back surgery   Dysrhythmia    A-fib every few weeks   Ectatic thoracic aorta (HCC)    a. 4.3cm by CT 03/2018.   GERD (gastroesophageal reflux disease)    Hemorrhoids    History of echocardiogram    a. Echo 10/16 [facility]):  EF 55%, trace MR, mild  TR, mild to mod AI, mild dilated Ao root (41 mm)   Hyperlipemia    Organic erectile dysfunction    PAF (paroxysmal atrial fibrillation) (HCC)    a. admx to [facility] in [place], [place] 62/13 with AF with RVR >> converted to NSR with IV Dilt;  b. Flecainide started >> ETT neg for pro-arrhythmia    Pneumonia 06/2020   Prostate cancer (HCC) 04/25/2006   Gleason 3+4=7   Prostatitis    S/P cardiac cath    a. LHC at [facility] 10/16:  LM ok, mLAD 30%, LCx ok, dRCA 20%   S/P radiation therapy 01/19/2014 through 03/05/2014                                                      Prostate bed 6600 cGy in 33 sessions                           Torn rotator cuff    right  40% tear   Past Surgical History:  Procedure Laterality Date   ABLATION OF DYSRHYTHMIC FOCUS  03/28/2018   APPENDECTOMY  1967  ATRIAL FIBRILLATION ABLATION N/A 03/28/2018   Procedure: ATRIAL FIBRILLATION ABLATION;  Surgeon: Hillis Range, MD;  Location: [facility] INVASIVE CV LAB;  Service: Cardiovascular;  Laterality: N/A;   ATRIAL FIBRILLATION ABLATION N/A 11/11/2019   Procedure: ATRIAL FIBRILLATION ABLATION;  Surgeon: Hillis Range, MD;  Location: [facility] INVASIVE CV LAB;  Service: Cardiovascular;  Laterality: N/A;   ATRIAL FIBRILLATION ABLATION N/A 12/14/2021   Procedure: ATRIAL FIBRILLATION ABLATION;  Surgeon: Regan Lemming, MD;  Location: [facility] INVASIVE CV LAB;  Service: Cardiovascular;  Laterality: N/A;   BACK SURGERY  2019   CARDIAC CATHETERIZATION  2016   [place]   CATARACT EXTRACTION W/ INTRAOCULAR LENS IMPLANT Left 11/2011   CATARACT EXTRACTION W/ INTRAOCULAR LENS IMPLANT Right 09/2015   COLONOSCOPY     LARYNX SURGERY  2000   vocal cord lesion- benign   LUMBAR LAMINECTOMY/DECOMPRESSION MICRODISCECTOMY Right 07/26/2017   Procedure: RIGHT LUMBAR 3- LUMBAR 4 FORAMINOTOMY WITH RESECTION OF SYNOVIAL CYST;  Surgeon: Maeola Harman, MD;  Location[facility] OR;  Service: Neurosurgery;  Laterality: Right;  RIGHT LUMBAR 3- LUMBAR 4  FORAMINOTOMY WITH RESECTION OF SYNOVIAL CYST   MENISCUS REPAIR Right    POLYPECTOMY     PROSTATE BIOPSY  1999   PROSTATECTOMY  04/25/2006   REVERSE SHOULDER ARTHROPLASTY Left 09/02/2020   Procedure: REVERSE SHOULDER ARTHROPLASTY;  Surgeon: Francena Hanly, MD;  Location: [facility] ORS;  Service: Orthopedics;  Laterality: Left;      No outpatient medications have been marked as taking for the 01/10/23 encounter (Video Visit) with [place]an Lemming, MD.     Allergies:   Patient has no known allergies.   Social History   Tobacco Use   Smoking status: Former    Current packs/day: 0.00    Average packs/day: 1 pack/day for 10.0 years (10.0 ttl pk-yrs)    Types: Cigarettes    Start date: 66    Quit date: 76    Years since quitting: 52.8   Smokeless tobacco: Never   Tobacco comments:    Former smoker 03/15/22  Vaping Use   Vaping status: Never Used  Substance Use Topics   Alcohol use: Not Currently   Drug use: No     Family Hx: The patient's family history includes Arthritis in his mother; Atrial fibrillation in his son; Diabetes in his maternal grandmother; Emphysema in his father; Heart disease in his maternal grandfather, mother, and paternal grandfather; Hypertension in his mother; Liver cancer in his paternal grandmother; Lung cancer in his father. There is no history of Stomach cancer, Rectal cancer, Esophageal cancer, or Colon cancer.  ROS:   Please see the history of present illness.     All other systems reviewed and are negative.   Labs/Other Tests and Data Reviewed:    EKG:  An ECG dated 04/04/22 was personally reviewed today and demonstrated:  sinus rhythm  Recent Labs: No results found for requested labs within last 365 days.   Recent Lipid Panel No results found for: "CHOL", "TRIG", "HDL", "CHOLHDL", "LDLCALC", "LDLDIRECT"  Wt Readings from Last 3 Encounters:  12/20/22 221 lb (100.2 kg)  04/04/22 218 lb (98.9 kg)  03/15/22 214 lb (97.1 kg)     Risk  Assessment/Calculations:    CHA2DS2-VASc Score =     This indicates a  % annual risk of stroke. The patient's score is based upon:          Objective:    Vital Signs:  BP 112/81   Pulse (!) 130    VITAL  SIGNS:  reviewed GEN:  no acute distress EYES:  sclerae anicteric, EOMI - Extraocular Movements Intact RESPIRATORY:  normal respiratory effort, symmetric expansion SKIN:  no rash, lesions or ulcers. NEURO:  alert and oriented x 3, no obvious focal deficit PSYCH:  normal affect  ASSESSMENT & PLAN:    Paroxysmal atrial fibrillation: Post multiple ablations.  Currently on flecainide and diltiazem.  He is continue to have episodes of atrial fibrillation, but he feels much improved after his most recent ablation.  He is in atrial fibrillation today.  He Arhaan Chesnut take as needed diltiazem and let us know if he remains in atrial fibrillation.  May need to follow-up in A-fib clinic next week Secondary hypercoagulable state: Currently on Eliquis for atrial fibrillation            Time:   Today, I have spent 30 minutes with the patient with telehealth technology discussing the above problems.     Medication Adjustments/Labs and Tests Ordered: Current medicines are reviewed at length with the patient today.  Concerns regarding medicines are outlined above.   Tests Ordered: No orders of the defined types were placed in this encounter.   Medication Changes: No orders of the defined types were placed in this encounter.   Follow Up:  In Person in 6 month(s)  Signed, Pandora Mccrackin Jorja Loa, MD  01/10/2023 1:35 PM    [facility]

## 2023-01-11 ENCOUNTER — Encounter: Payer: Self-pay | Admitting: Cardiology

## 2023-01-19 ENCOUNTER — Emergency Department (HOSPITAL_BASED_OUTPATIENT_CLINIC_OR_DEPARTMENT_OTHER): Payer: Medicare Other | Admitting: Radiology

## 2023-01-19 ENCOUNTER — Other Ambulatory Visit (HOSPITAL_COMMUNITY): Payer: Self-pay | Admitting: Urology

## 2023-01-19 ENCOUNTER — Other Ambulatory Visit: Payer: Self-pay

## 2023-01-19 ENCOUNTER — Emergency Department (HOSPITAL_BASED_OUTPATIENT_CLINIC_OR_DEPARTMENT_OTHER): Payer: Medicare Other

## 2023-01-19 ENCOUNTER — Emergency Department (HOSPITAL_BASED_OUTPATIENT_CLINIC_OR_DEPARTMENT_OTHER)
Admission: EM | Admit: 2023-01-19 | Discharge: 2023-01-19 | Disposition: A | Discharge status: Home / Self Care | Payer: Medicare Other | Attending: Emergency Medicine | Admitting: Emergency Medicine

## 2023-01-19 ENCOUNTER — Other Ambulatory Visit: Payer: Self-pay | Admitting: Cardiology

## 2023-01-19 DIAGNOSIS — Z87891 Personal history of nicotine dependence: Secondary | ICD-10-CM | POA: Insufficient documentation

## 2023-01-19 DIAGNOSIS — Z7901 Long term (current) use of anticoagulants: Secondary | ICD-10-CM | POA: Insufficient documentation

## 2023-01-19 DIAGNOSIS — Z7951 Long term (current) use of inhaled steroids: Secondary | ICD-10-CM | POA: Insufficient documentation

## 2023-01-19 DIAGNOSIS — Z8546 Personal history of malignant neoplasm of prostate: Secondary | ICD-10-CM | POA: Insufficient documentation

## 2023-01-19 DIAGNOSIS — Z96612 Presence of left artificial shoulder joint: Secondary | ICD-10-CM | POA: Diagnosis not present

## 2023-01-19 DIAGNOSIS — R079 Chest pain, unspecified: Secondary | ICD-10-CM

## 2023-01-19 DIAGNOSIS — I480 Paroxysmal atrial fibrillation: Secondary | ICD-10-CM | POA: Diagnosis not present

## 2023-01-19 DIAGNOSIS — J45909 Unspecified asthma, uncomplicated: Secondary | ICD-10-CM | POA: Insufficient documentation

## 2023-01-19 DIAGNOSIS — J9811 Atelectasis: Secondary | ICD-10-CM | POA: Diagnosis not present

## 2023-01-19 DIAGNOSIS — R9721 Rising PSA following treatment for malignant neoplasm of prostate: Secondary | ICD-10-CM

## 2023-01-19 DIAGNOSIS — R0789 Other chest pain: Secondary | ICD-10-CM | POA: Diagnosis not present

## 2023-01-19 DIAGNOSIS — I4891 Unspecified atrial fibrillation: Secondary | ICD-10-CM | POA: Diagnosis not present

## 2023-01-19 DIAGNOSIS — C61 Malignant neoplasm of prostate: Secondary | ICD-10-CM

## 2023-01-19 LAB — BASIC METABOLIC PANEL
Anion gap: 7 (ref 5–15)
BUN: 19 mg/dL (ref 8–23)
CO2: 27 mmol/L (ref 22–32)
Calcium: 9.6 mg/dL (ref 8.9–10.3)
Chloride: 104 mmol/L (ref 98–111)
Creatinine, Ser: 1.17 mg/dL (ref 0.61–1.24)
GFR, Estimated: >60 mL/min (ref >60)
Glucose, Bld: 105 mg/dL — ABNORMAL HIGH (ref 70–99)
Potassium: 3.9 mmol/L (ref 3.5–5.1)
Sodium: 138 mmol/L (ref 135–145)

## 2023-01-19 LAB — CBC
HCT: 43 % (ref 39.0–52.0)
Hemoglobin: 14.5 g/dL (ref 13.0–17.0)
MCH: 31.5 pg (ref 26.0–34.0)
MCHC: 33.7 g/dL (ref 30.0–36.0)
MCV: 93.5 fL (ref 80.0–100.0)
Platelets: 178 10*3/uL (ref 150–400)
RBC: 4.6 MIL/uL (ref 4.22–5.81)
RDW: 12.2 % (ref 11.5–15.5)
WBC: 7.5 10*3/uL (ref 4.0–10.5)
nRBC: 0 % (ref 0.0–0.2)

## 2023-01-19 LAB — TROPONIN I (HIGH SENSITIVITY)
Troponin I (High Sensitivity): 8 ng/L (ref <18)
Troponin I (High Sensitivity): 8 ng/L (ref <18)

## 2023-01-19 MED ORDER — NITROGLYCERIN 0.4 MG SL SUBL
0.4000 mg | SUBLINGUAL_TABLET | SUBLINGUAL | Status: DC | PRN
  Administered 2023-01-19 (×2): 0.4 mg via SUBLINGUAL
  Filled 2023-01-19: qty 1

## 2023-01-19 MED ORDER — ASPIRIN 325 MG PO TABS
325.0000 mg | ORAL_TABLET | Freq: Every day | ORAL | Status: DC
Start: 2023-01-19 — End: 2023-01-19
  Administered 2023-01-19: 325 mg via ORAL
  Filled 2023-01-19: qty 1

## 2023-01-19 NOTE — ED Notes (Signed)
States chest pain has increased to 9/10

## 2023-01-19 NOTE — Discharge Instructions (Signed)
You were seen in the emergency department for episode of chest pain today.  You had blood work EKG and chest x-ray that did not show any obvious episode of heart attack.  Cardiology Dr. Anne Fu feels comfortable with you following up in the office as an outpatient.  The cardiology clinic should call you with an urgent appointment.  Please continue your regular medications and return to the emergency department if any worsening or concerning symptoms

## 2023-01-19 NOTE — ED Provider Notes (Signed)
[facility] AT [facility] Provider Note  CSN: [id number] Arrival date & time: 01/19/23 1225  Chief Complaint(s) Chest Pain  HPI Brandon Simmons. is a 77 y.o. male here today for chest pain.  Patient has a history of A-fib, status post 3 ablations.  He takes flecainide and diltiazem daily.  At 5 AM this morning he felt he was going into A-fib again.  He took his rescue diltiazem, which broke his rhythm.  He says that when this has happened in the past, he has never had chest pain.   Past Medical History Past Medical History:  Diagnosis Date   Aortic atherosclerosis (HCC)    Arthritis    Asthma    Diverticulitis    Diverticulosis    DVT (deep venous thrombosis) (HCC) 2019   right leg after back surgery   Dysrhythmia    A-fib every few weeks   Ectatic thoracic aorta (HCC)    a. 4.3cm by CT 03/2018.   GERD (gastroesophageal reflux disease)    Hemorrhoids    History of echocardiogram    a. Echo 10/16 [facility]):  EF 55%, trace MR, mild TR, mild to mod AI, mild dilated Ao root (41 mm)   Hyperlipemia    Organic erectile dysfunction    PAF (paroxysmal atrial fibrillation) (HCC)    a. admx to [facility] in [place], [place] 78/46 with AF with RVR >> converted to NSR with IV Dilt;  b. Flecainide started >> ETT neg for pro-arrhythmia    Pneumonia 06/2020   Prostate cancer (HCC) 04/25/2006   Gleason 3+4=7   Prostatitis    S/P cardiac cath    a. LHC at 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 Authorizing Provider  acetaminophen (TYLENOL) 500 MG tablet Take 500 mg by mouth 2 (two) times daily as needed.    [provider]  apixaban (ELIQUIS) 5 MG TABS tablet Take 5 mg by mouth 2 (two) times daily.     [provider]  atorvastatin (LIPITOR) 20 MG tablet Take 20 mg by mouth daily.    [provider]  Azelastine HCl 137 MCG/SPRAY SOLN 1 puff in each nostril Nasally Twice a day for 30 days  [provider]  diltiazem (CARDIZEM CD) 180 MG 24 hr capsule TAKE 1 CAPSULE (180 MG TOTAL) BY MOUTH DAILY IN THE AFTERNOON. 11/18/20   Newman Nip, NP  diltiazem (CARDIZEM) 30 MG tablet TAKE 1 TABLET EVERY 4 HOURS AS NEEDED FOR AFIB RAPID HEART RATE OVER 100 06/03/21   Newman Nip, NP  EPINEPHrine 0.3 mg/0.3 mL IJ SOAJ injection Inject 0.3 mg into the muscle as needed for anaphylaxis. 04/06/19   [provider]  fexofenadine (ALLEGRA ALLERGY) 180 MG tablet Take 1 tablet by mouth daily. 08/17/21   [provider]  flecainide (TAMBOCOR) 100 MG tablet TAKE 1.5 TABLETS BY MOUTH 2 TIMES DAILY. 07/06/22   Camnitz, Will Daphine Deutscher, MD  fluticasone (FLONASE) 50 MCG/ACT nasal spray Place 1 spray into both nostrils daily. 08/19/21   [provider]  GLUCOSAMINE-CHONDROITIN PO Take 1,500 mg by mouth daily.    [provider]  influenza vaccine adjuvanted (FLUAD QUADRIVALENT) 0.5 ML injection Inject into the muscle. 12/19/21   Judyann Munson, MD  ipratropium (ATROVENT) 0.06 % nasal spray Place 2 sprays into both nostrils  3 (three) times daily. 10/09/22   [provider]  losartan (COZAAR) 50 MG tablet Take 50 mg by mouth daily. 10/09/22   [provider]  Mometasone Furoate (ASMANEX HFA) 100 MCG/ACT AERO Inhale 1 puff into the lungs every evening.    [provider]  montelukast (SINGULAIR) 10 MG tablet Take 10 mg by mouth at bedtime.    [provider]  Naphazoline-Pheniramine (OPCON-A) 0.027-0.315 % SOLN Place 1 drop into both eyes 2 (two) times daily as needed (eye allergies (red/irritated eyes)).    [provider]  NON FORMULARY Allergy Shots every 3 weeks    [provider]  omeprazole (PRILOSEC) 20 MG capsule Take 20 mg by mouth 2 (two) times daily before a meal.     [provider]  Probiotic Product (ALIGN) 4 MG CAPS Take 4 mg by mouth every evening.    [provider]  psyllium (METAMUCIL) 58.6 % powder Take 1 packet by mouth 2 (two) times daily.    [provider]  VENTOLIN HFA 108 (90 Base) MCG/ACT inhaler Inhale 1-2 puffs into the lungs every 6 (six) hours as needed for wheezing or shortness of breath. 12/30/18   [provider]                                                                                                                                    Past Surgical History Past Surgical History:  Procedure Laterality Date   ABLATION OF DYSRHYTHMIC FOCUS  03/28/2018   APPENDECTOMY  1967   ATRIAL FIBRILLATION ABLATION N/A 03/28/2018   Procedure: ATRIAL FIBRILLATION ABLATION;  Surgeon: Hillis Range, MD;  Location: [facility] INVASIVE CV LAB;  Service: Cardiovascular;  Laterality: N/A;   ATRIAL FIBRILLATION ABLATION N/A 11/11/2019   Procedure: ATRIAL FIBRILLATION ABLATION;  Surgeon: Johney Frame,  Fayrene Fearing, MD;  Location: [facility] INVASIVE CV LAB;  Service: Cardiovascular;  Laterality: N/A;   ATRIAL FIBRILLATION ABLATION N/A 12/14/2021   Procedure: ATRIAL FIBRILLATION ABLATION;  Surgeon: Regan Lemming, MD;  Location: [facility] INVASIVE  CV LAB;  Service: Cardiovascular;  Laterality: N/A;   BACK SURGERY  2019   CARDIAC CATHETERIZATION  2016   [place]   CATARACT EXTRACTION W/ INTRAOCULAR LENS IMPLANT Left 11/2011   CATARACT EXTRACTION W/ INTRAOCULAR LENS IMPLANT Right 09/2015   COLONOSCOPY     LARYNX SURGERY  2000   vocal cord lesion- benign   LUMBAR LAMINECTOMY/DECOMPRESSION MICRODISCECTOMY Right 07/26/2017   Procedure: RIGHT LUMBAR 3- LUMBAR 4 FORAMINOTOMY WITH RESECTION OF SYNOVIAL CYST;  Surgeon: Maeola Harman, MD;  Location: [facility];  Service: Neurosurgery;  Laterality: Right;  RIGHT LUMBAR 3- LUMBAR 4 FORAMINOTOMY WITH RESECTION OF SYNOVIAL CYST   MENISCUS REPAIR Right    POLYPECTOMY     PROSTATE BIOPSY  1999   PROSTATECTOMY  04/25/2006   REVERSE SHOULDER ARTHROPLASTY Left 09/02/2020   Procedure: REVERSE SHOULDER ARTHROPLASTY;  Surgeon: Francena Hanly, MD;  Location: [facility] ORS;  Service: Orthopedics;  Laterality: Left;    Family History Family History  Problem Relation Age of Onset   Emphysema Father    Lung cancer Father    Hypertension Mother    Heart disease Mother        heart attack   Arthritis Mother    Diabetes Maternal Grandmother    Heart disease Maternal Grandfather        Died age 54   Liver cancer Paternal Grandmother    Heart disease Paternal Grandfather        Died age 62   Atrial fibrillation Son    Stomach cancer Neg Hx    Rectal cancer Neg Hx    Esophageal cancer Neg Hx    Colon cancer Neg Hx     Social History Social History   Tobacco Use   Smoking status: Former    Current packs/day: 0.00    Average packs/day: 1 pack/day for 10.0 years (10.0 ttl pk-yrs)    Types: Cigarettes    Start date: 16    Quit date: 62    Years since quitting: 52.8   Smokeless tobacco: Never   Tobacco comments:    Former smoker 03/15/22  Vaping Use   Vaping status: Never Used  Substance Use Topics   Alcohol use: Not Currently   Drug use: No   Allergies Patient has no known  allergies.  Review of Systems Review of Systems  Physical Exam Vital Signs  I have reviewed the triage vital signs BP 123/70   Pulse (!) 50   Temp 97.7 F (36.5 C) (Oral)   Resp 13   SpO2 98%   Physical Exam Vitals reviewed.  Constitutional:      Appearance: He is well-developed.  HENT:     Head: Normocephalic and atraumatic.  Cardiovascular:     Rate and Rhythm: Normal rate and regular rhythm.     Heart sounds: Normal heart sounds.  Pulmonary:     Effort: Pulmonary effort is normal.  Musculoskeletal:     Right lower leg: No edema.  Skin:    General: Skin is warm and dry.  Neurological:     Mental Status: He is alert.     ED Results and Treatments Labs (all labs ordered are listed, but only abnormal results are displayed) Labs Reviewed  BASIC METABOLIC PANEL - Abnormal; Notable for the  following components:      Result Value   Glucose, Bld 105 (*)    All other components within normal limits  CBC  TROPONIN I (HIGH SENSITIVITY)  TROPONIN I (HIGH SENSITIVITY)                                                                                                                          Radiology DG Chest Portable 1 View  Result Date: 01/19/2023 CLINICAL DATA:  CP EXAM: PORTABLE CHEST 1 VIEW COMPARISON:  CXR 01/20/22 FINDINGS: No pleural effusion. No pneumothorax. Normal cardiac and mediastinal contours. Left basilar atelectasis. No radiographically apparent displaced rib fractures. Visualized upper abdomen unremarkable. Left shoulder arthroplasty. IMPRESSION: Left basilar atelectasis Electronically Signed   By: Lorenza Cambridge M.D.   On: 01/19/2023 15:19    Pertinent labs & imaging results that were available during my care of the patient were reviewed by me and considered in my medical decision making (see MDM for details).  Medications Ordered in ED Medications  aspirin tablet 325 mg (325 mg Oral Given 01/19/23 1455)  nitroGLYCERIN (NITROSTAT) SL tablet 0.4 mg (0.4 mg  Sublingual Given 01/19/23 1501)                                                                                                                                     Procedures Procedures  (including critical care time)  Medical Decision Making / ED Course   This patient presents to the ED for concern of chest pain, this involves an extensive number of treatment options, and is a complaint that carries with it a high risk of complications and morbidity.  The differential diagnosis includes ACS, atrial fibrillation, myocarditis, less likely PE.  MDM: 77 year old male here today for a resolved episode of A-fib, subsequent chest pain.  Patient currently normal sinus rhythm.  Concern would be that when this patient had his accelerated rhythm, this was effectively a stress test.  Patient had a stress test in May of last year, had cardiac CT done at that time.  Initial EKG nonischemic.  Troponin ordered.  Reviewed the patient's cardiology office note, stress test, cardiac CT.  Reassessment 3:20 PM-patient with intermittent episodes of chest pain.  Did give him nitro and aspirin.  With the patient having intermittent episodes of chest pain, will admit to hospitalist for chest pain observation.  Cardiology consultation placed.   Additional history  obtained: -Additional history obtained from patient's wife at bedside -External records from outside source obtained and reviewed including: Chart review including previous notes, labs, imaging, consultation notes   Lab Tests: -I ordered, reviewed, and interpreted labs.   The pertinent results include:   Labs Reviewed  BASIC METABOLIC PANEL - Abnormal; Notable for the following components:      Result Value   Glucose, Bld 105 (*)    All other components within normal limits  CBC  TROPONIN I (HIGH SENSITIVITY)  TROPONIN I (HIGH SENSITIVITY)      EKG sinus rhythm, no ST segment depressions or elevations, no evidence of acute ischemia.  EKG  Interpretation Date/Time:  Friday January 19 2023 12:36:39 EDT Ventricular Rate:  61 PR Interval:  196 QRS Duration:  103 QT Interval:  416 QTC Calculation: 419 R Axis:   3  Text Interpretation: Sinus rhythm Borderline repolarization abnormality Confirmed by Anders Simmonds [phone]) on 01/19/2023 2:49:14 PM         Imaging Studies ordered: I ordered imaging studies including x-ray I independently visualized and interpreted imaging. I agree with the radiologist interpretation   Medicines ordered and prescription drug management: Meds ordered this encounter  Medications   aspirin tablet 325 mg   nitroGLYCERIN (NITROSTAT) SL tablet 0.4 mg    -I have reviewed the patients home medicines and have made adjustments as needed  Critical interventions   Consultations Obtained: I requested consultation with the hospitalist,  and discussed lab and imaging findings as well as pertinent plan - they recommend: Admission   Cardiac Monitoring: The patient was maintained on a cardiac monitor.  I personally viewed and interpreted the cardiac monitored which showed an underlying rhythm of: Normal sinus rhythm  Social Determinants of Health:  Factors impacting patients care include:    Reevaluation: After the interventions noted above, I reevaluated the patient and found that they have :improved  Co morbidities that complicate the patient evaluation  Past Medical History:  Diagnosis Date   Aortic atherosclerosis (HCC)    Arthritis    Asthma    Diverticulitis    Diverticulosis    DVT (deep venous thrombosis) (HCC) 2019   right leg after back surgery   Dysrhythmia    A-fib every few weeks   Ectatic thoracic aorta (HCC)    a. 4.3cm by CT 03/2018.   GERD (gastroesophageal reflux disease)    Hemorrhoids    History of echocardiogram    a. Echo 10/16 [facility]):  EF 55%, trace MR, mild TR, mild to mod AI, mild dilated Ao root (41 mm)   Hyperlipemia    Organic erectile  dysfunction    PAF (paroxysmal atrial fibrillation) (HCC)    a. admx to [facility] in [place], [place] 59/45 with AF with RVR >> converted to NSR with IV Dilt;  b. Flecainide started >> ETT neg for pro-arrhythmia    Pneumonia 06/2020   Prostate cancer (HCC) 04/25/2006   Gleason 3+4=7   Prostatitis    S/P cardiac cath    a. LHC at [facility] 10/16:  LM ok, mLAD 30%, LCx ok, dRCA 20%   S/P radiation therapy 01/19/2014 through 03/05/2014                                                      Prostate  bed 6600 cGy in 33 sessions                           Torn rotator cuff    right  40% tear      Dispostion: Admission     Final Clinical Impression(s) / ED Diagnoses Final diagnoses:  None     @PCDICTATION @    Anders Simmonds T, DO 01/19/23 1527

## 2023-01-19 NOTE — ED Triage Notes (Signed)
Pt reports h/o afib with multiple ablations. Awoke this am at 0400 with an episode of afib, took diltiazem at home which resolved afib episode, but pt now reports heaviness in chest, left jaw pain, nausea, dizziness, SOB.

## 2023-01-19 NOTE — ED Provider Notes (Signed)
Signout from Dr. Andria Meuse.  77 year old male with history of paroxysmal A-fib went into probable A-fib this morning took his rescue diltiazem.  Seem to help.  Unfortunately since then he has had on and off chest pain multiple episodes today.  Does not usually get chest pain.  2 troponins negative.  Plan is to review with cardiology for admission Physical Exam  BP 123/70   Pulse (!) 50   Temp 97.7 F (36.5 C) (Oral)   Resp 13   SpO2 98%   Physical Exam  Procedures  Procedures  ED Course / MDM    Medical Decision Making Amount and/or Complexity of Data Reviewed Labs: ordered. Radiology: ordered.  Risk OTC drugs. Prescription drug management.   4:20 PM.  Discussed with [facility] hospitalist Dr. Lajuana Ripple.   4:30 PM.  Had discussion with patient and Dr. Anne Fu from cardiology.  Dr. Anne Fu reviewed the EKG and his workup and felt he was appropriate for outpatient follow-up and will message the group to have that arranged urgently.  Patient is comfortable plan he understands to return to the emergency department if any acute worsening of his condition.     Terrilee Files, MD 01/20/23 [phone]

## 2023-01-19 NOTE — ED Notes (Signed)
Chest pain decreased from 9/10 to a 4/10 after two nitroglycerin SL

## 2023-01-19 NOTE — Progress Notes (Signed)
Steffanie Dunn ordered per Dr. Anne Fu (message sent to the office to schedule)

## 2023-01-19 NOTE — ED Notes (Signed)
Patient states chest pain is gone. 

## 2023-01-26 ENCOUNTER — Encounter: Payer: Self-pay | Admitting: Cardiology

## 2023-01-29 ENCOUNTER — Encounter (HOSPITAL_COMMUNITY): Payer: BLUE CROSS/BLUE SHIELD

## 2023-01-30 ENCOUNTER — Ambulatory Visit (HOSPITAL_COMMUNITY): Payer: Medicare Other | Attending: Internal Medicine

## 2023-01-30 DIAGNOSIS — R079 Chest pain, unspecified: Secondary | ICD-10-CM | POA: Diagnosis not present

## 2023-01-30 LAB — MYOCARDIAL PERFUSION IMAGING
LV dias vol: 115 mL (ref 62–150)
LV sys vol: 46 mL
Nuc Stress EF: 60 %
Peak HR: 63 {beats}/min
Rest HR: 53 {beats}/min
Rest Nuclear Isotope Dose: 10.4 mCi
SDS: 2
SRS: 0
SSS: 2
ST Depression (mm): 0 mm
Stress Nuclear Isotope Dose: 31.6 mCi
TID: 1

## 2023-01-30 MED ORDER — REGADENOSON 0.4 MG/5ML IV SOLN
0.4000 mg | Freq: Once | INTRAVENOUS | Status: AC
  Administered 2023-01-30: 0.4 mg via INTRAVENOUS

## 2023-01-30 MED ORDER — TECHNETIUM TC 99M TETROFOSMIN IV KIT
10.4000 | PACK | Freq: Once | INTRAVENOUS | Status: AC | PRN
  Administered 2023-01-30: 10.4 via INTRAVENOUS

## 2023-01-30 MED ORDER — TECHNETIUM TC 99M TETROFOSMIN IV KIT
31.6000 | PACK | Freq: Once | INTRAVENOUS | Status: AC | PRN
  Administered 2023-01-30: 31.6 via INTRAVENOUS

## 2023-02-02 ENCOUNTER — Encounter (HOSPITAL_COMMUNITY): Payer: Self-pay

## 2023-02-02 ENCOUNTER — Other Ambulatory Visit (HOSPITAL_COMMUNITY): Payer: Self-pay

## 2023-02-02 ENCOUNTER — Telehealth: Payer: Self-pay | Admitting: Pharmacist

## 2023-02-02 ENCOUNTER — Telehealth: Payer: Self-pay

## 2023-02-02 ENCOUNTER — Encounter (HOSPITAL_COMMUNITY): Payer: Self-pay | Admitting: Physician Assistant

## 2023-02-02 ENCOUNTER — Telehealth: Payer: Self-pay | Admitting: Cardiology

## 2023-02-02 ENCOUNTER — Ambulatory Visit (HOSPITAL_COMMUNITY)
Admission: RE | Admit: 2023-02-02 | Discharge: 2023-02-02 | Disposition: A | Discharge status: Home / Self Care | Payer: Medicare Other | Source: Ambulatory Visit | Attending: Physician Assistant | Admitting: Physician Assistant

## 2023-02-02 VITALS — BP 114/84 | HR 107 | Ht 70.0 in | Wt 216.0 lb

## 2023-02-02 DIAGNOSIS — J3089 Other allergic rhinitis: Secondary | ICD-10-CM | POA: Diagnosis not present

## 2023-02-02 DIAGNOSIS — I700 Atherosclerosis of aorta: Secondary | ICD-10-CM | POA: Insufficient documentation

## 2023-02-02 DIAGNOSIS — I10 Essential (primary) hypertension: Secondary | ICD-10-CM | POA: Insufficient documentation

## 2023-02-02 DIAGNOSIS — Z7901 Long term (current) use of anticoagulants: Secondary | ICD-10-CM | POA: Insufficient documentation

## 2023-02-02 DIAGNOSIS — Z79899 Other long term (current) drug therapy: Secondary | ICD-10-CM | POA: Diagnosis not present

## 2023-02-02 DIAGNOSIS — J301 Allergic rhinitis due to pollen: Secondary | ICD-10-CM | POA: Diagnosis not present

## 2023-02-02 DIAGNOSIS — D6869 Other thrombophilia: Secondary | ICD-10-CM

## 2023-02-02 DIAGNOSIS — I480 Paroxysmal atrial fibrillation: Secondary | ICD-10-CM | POA: Diagnosis not present

## 2023-02-02 DIAGNOSIS — I484 Atypical atrial flutter: Secondary | ICD-10-CM | POA: Diagnosis not present

## 2023-02-02 DIAGNOSIS — J3081 Allergic rhinitis due to animal (cat) (dog) hair and dander: Secondary | ICD-10-CM | POA: Diagnosis not present

## 2023-02-02 LAB — MAGNESIUM: Magnesium: 2.3 mg/dL (ref 1.7–2.4)

## 2023-02-02 MED ORDER — DILTIAZEM HCL 30 MG PO TABS: ORAL_TABLET | ORAL | 1 refills | Status: DC

## 2023-02-02 MED ORDER — POTASSIUM CHLORIDE CRYS ER 10 MEQ PO TBCR: 10.0000 meq | EXTENDED_RELEASE_TABLET | Freq: Every day | ORAL | 3 refills | Status: DC

## 2023-02-02 NOTE — Telephone Encounter (Signed)
-----   Message from Nurse Stacy C sent at 02/02/2023 10:05 AM EST ----- Regarding: tikosyn Pt for tikosyn please review meds thanks stacy

## 2023-02-02 NOTE — Telephone Encounter (Signed)
   Patient c/o Palpitations:  STAT if patient reporting lightheadedness, shortness of breath, or chest pain  How long have you had palpitations/irregular HR/ Afib? Are you having the symptoms now? yes  Are you currently experiencing lightheadedness, SOB or CP? Not currently but experienced SOB and dizziness last night   Do you have a history of afib (atrial fibrillation) or irregular heart rhythm? Yes   Have you checked your BP or HR? (document readings if available): states his HR  right now is 134 and his bp earlier this morning was 111/74   Are you experiencing any other symptoms? No

## 2023-02-02 NOTE — Telephone Encounter (Signed)
Medication list reviewed in anticipation of upcoming Tikosyn initiation. Patient is not taking any contraindicated or QTc prolonging medications.   Patient is anticoagulated on Eliquis on the appropriate dose. Please ensure that patient has not missed any anticoagulation doses in the 3 weeks prior to Tikosyn initiation.   Patient will need to be counseled to avoid use of Benadryl while on Tikosyn and in the 2-3 days prior to Tikosyn initiation.  

## 2023-02-02 NOTE — Addendum Note (Signed)
Encounter addended by: Josepha Pigg, CPhT on: 02/02/2023 11:41 AM  Actions taken: Clinical Note Signed

## 2023-02-02 NOTE — Telephone Encounter (Signed)
 Transition Care Management Unsuccessful Follow-up Telephone Call  Date of discharge and from where:  [facility] 11/1  Attempts:  1st Attempt  Reason for unsuccessful TCM follow-up call:  No answer/busy   [facility]  [facility], [facility] Guide, Phone: [phone] Website: [email or web address]

## 2023-02-02 NOTE — Progress Notes (Signed)
Primary Care Physician: Garlan Fillers, MD Primary Cardiologist: Rollene Rotunda, MD Electrophysiologist: Will Jorja Loa, MD  Referring Physician: Dr Blinda Leatherwood. is a 77 y.o. male with a history of aortic atherosclerosis, HTN, prostate cancer, atrial fibrillation who presents for follow up in the [facility]. He is post ablation x 3 on 019 2020, 11/11/2019, 12/14/2021.  He is currently on flecainide 100 mg twice daily. Patient is on Eliquis for a CHADS2VASC score of 4.  On follow up today, patient reports that he has had much more frequent episodes of afib recently. He did have an ED visit on 11/1 for chest pain, workup negative for ACS. Stress test on 11/12 low risk for ischemia. He is in atypical flutter today with symptoms of fatigue and intermittent dizziness.   Today, he denies symptoms of palpitations, chest pain, shortness of breath, orthopnea, PND, lower extremity edema, presyncope, syncope, snoring, daytime somnolence, bleeding, or neurologic sequela. The patient is tolerating medications without difficulties and is otherwise without complaint today.    Atrial Fibrillation Risk Factors:  he does not have symptoms or diagnosis of sleep apnea. he does not have a history of rheumatic fever.   Atrial Fibrillation Management history:  Previous antiarrhythmic drugs: flecainide Previous cardioversions: 2 remotely  Previous ablations: 03/28/18, 11/11/19, 12/14/21 Anticoagulation history: Eliquis  ROS- All systems are reviewed and negative except as per the HPI above.  Past Medical History:  Diagnosis Date   Aortic atherosclerosis (HCC)    Arthritis    Asthma    Diverticulitis    Diverticulosis    DVT (deep venous thrombosis) (HCC) 2019   right leg after back surgery   Dysrhythmia    A-fib every few weeks   Ectatic thoracic aorta (HCC)    a. 4.3cm by CT 03/2018.   GERD (gastroesophageal reflux disease)    Hemorrhoids     History of echocardiogram    a. Echo 10/16 [facility]):  EF 55%, trace MR, mild TR, mild to mod AI, mild dilated Ao root (41 mm)   Hyperlipemia    Organic erectile dysfunction    PAF (paroxysmal atrial fibrillation) (HCC)    a. admx to [facility] in [place], [place] 40/98 with AF with RVR >> converted to NSR with IV Dilt;  b. Flecainide started >> ETT neg for pro-arrhythmia    Pneumonia 06/2020   Prostate cancer (HCC) 04/25/2006   Gleason 3+4=7   Prostatitis    S/P cardiac cath    a. LHC at [facility] 10/16:  LM ok, mLAD 30%, LCx ok, dRCA 20%   S/P radiation therapy 01/19/2014 through 03/05/2014                                                      Prostate bed 6600 cGy in 33 sessions                           Torn rotator cuff    right  40% tear    Current Outpatient Medications  Medication Sig Dispense Refill   acetaminophen (TYLENOL) 500 MG tablet Take 500 mg by mouth 2 (two) times daily as needed.     apixaban (ELIQUIS) 5 MG TABS tablet Take 5 mg by  mouth 2 (two) times daily.      atorvastatin (LIPITOR) 20 MG tablet Take 20 mg by mouth daily.     Azelastine HCl 137 MCG/SPRAY SOLN 1 puff in each nostril Nasally Twice a day for 30 days     diltiazem (CARDIZEM CD) 180 MG 24 hr capsule TAKE 1 CAPSULE (180 MG TOTAL) BY MOUTH DAILY IN THE AFTERNOON. 90 capsule 3   diltiazem (CARDIZEM) 30 MG tablet TAKE 1 TABLET EVERY 4 HOURS AS NEEDED FOR AFIB RAPID HEART RATE OVER 100 540 tablet 1   EPINEPHrine 0.3 mg/0.3 mL IJ SOAJ injection Inject 0.3 mg into the muscle as needed for anaphylaxis.     fexofenadine (ALLEGRA ALLERGY) 180 MG tablet Take 1 tablet by mouth daily.     flecainide (TAMBOCOR) 100 MG tablet TAKE 1.5 TABLETS BY MOUTH 2 TIMES DAILY. 270 tablet 2   fluticasone (FLONASE) 50 MCG/ACT nasal spray Place 1 spray into both nostrils daily.     GLUCOSAMINE-CHONDROITIN PO Take 1,500 mg by mouth daily.     influenza vaccine adjuvanted (FLUAD QUADRIVALENT) 0.5 ML injection Inject  into the muscle. 0.5 mL 0   ipratropium (ATROVENT) 0.06 % nasal spray Place 2 sprays into both nostrils 3 (three) times daily.     losartan (COZAAR) 50 MG tablet Take 50 mg by mouth daily.     Mometasone Furoate (ASMANEX HFA) 100 MCG/ACT AERO Inhale 1 puff into the lungs every evening.     montelukast (SINGULAIR) 10 MG tablet Take 10 mg by mouth at bedtime.     Naphazoline-Pheniramine (OPCON-A) 0.027-0.315 % SOLN Place 1 drop into both eyes 2 (two) times daily as needed (eye allergies (red/irritated eyes)).     NON FORMULARY Allergy Shots every 3 weeks     omeprazole (PRILOSEC) 20 MG capsule Take 20 mg by mouth 2 (two) times daily before a meal.      Probiotic Product (ALIGN) 4 MG CAPS Take 4 mg by mouth every evening.     psyllium (METAMUCIL) 58.6 % powder Take 1 packet by mouth 2 (two) times daily.     VENTOLIN HFA 108 (90 Base) MCG/ACT inhaler Inhale 1-2 puffs into the lungs every 6 (six) hours as needed for wheezing or shortness of breath.     No current facility-administered medications for this encounter.    Physical Exam: BP 114/84   Pulse (!) 107   Ht 5\' 10"  (1.778 m)   Wt 98 kg   SpO2 98%   BMI 30.99 kg/m   GEN: Well nourished, well developed in no acute distress NECK: No JVD; No carotid bruits CARDIAC: Irregularly irregular rate and rhythm, no murmurs, rubs, gallops RESPIRATORY:  Clear to auscultation without rales, wheezing or rhonchi  ABDOMEN: Soft, non-tender, non-distended EXTREMITIES:  No edema; No deformity   Wt Readings from Last 3 Encounters:  02/02/23 98 kg  01/30/23 100.2 kg  12/20/22 100.2 kg     EKG today demonstrates  Atypical atrial flutter with variable block Vent. rate 107 BPM PR interval * ms QRS duration 100 ms QT/QTcB 318/424 ms  Echo 08/24/18 demonstrated   1. The left ventricle has normal systolic function with an ejection  fraction of 60-65%. The cavity size was normal. Left ventricular diastolic  Doppler parameters are consistent with  pseudonormalization.   2. The right ventricle has normal systolic function. The cavity was  normal. There is no increase in right ventricular wall thickness.   3. No evidence of mitral valve stenosis.   4.  Aortic valve regurgitation is mild by color flow Doppler. No stenosis  of the aortic valve.   5. There is mild dilatation of the aortic root and of the ascending  aorta.   6. The interatrial septum was not assessed.    CHA2DS2-VASc Score = 4  The patient's score is based upon: CHF History: 0 HTN History: 1 Diabetes History: 0 Stroke History: 0 Vascular Disease History: 1 (aortic atherosclerosis) Age Score: 2 Gender Score: 0       ASSESSMENT AND PLAN: Paroxysmal Atrial Fibrillation (ICD10:  I48.0) The patient's CHA2DS2-VASc score is 4, indicating a 4.8% annual risk of stroke.   S/p ablation 03/2018, 11/11/19, 12/14/21 We discussed rhythm control options today. Patient would like to pursue dofetilide admission. Continue Eliquis 5 mg BID, states no missed doses in the last 3 weeks. No recent benadryl use PharmD to screen medications. Patient will stop flecainide today. QTc in SR 419 ms Recent bmet reviewed, will start KCL 10 meq daily Check magnesium today Continue diltiazem 180 mg daily with 30 mg PRN q 4 hours for heart racing Continue Eliquis 5 mg BID  Secondary Hypercoagulable State (ICD10:  D68.69) The patient is at significant risk for stroke/thromboembolism based upon his CHA2DS2-VASc Score of 4.  Continue Apixaban (Eliquis).   HTN Stable today but has had low readings at home. Will cut losartan in half to 25 mg daily for now.    Follow up next week for dofetilide admission.        Jorja Loa PA-C Afib Clinic [facility] [address] [place], [place] [address] [phone]

## 2023-02-02 NOTE — TOC Benefit Eligibility Note (Signed)
Patient Product/process development scientist completed.    The patient is insured through [employer]. Patient has Medicare and is not eligible for a copay card, but may be able to apply for patient assistance, if available.    Ran test claim for dofetilide (Tikosyn) 500 mcg and the current 30 day co-pay is $84.18 due to being in Coverage Gap (donut hole).   This test claim was processed through [facility]- copay amounts may vary at other pharmacies due to pharmacy/plan contracts, or as the patient moves through the different stages of their insurance plan.     Roland Earl, CPHT Pharmacy Technician III Certified Patient Advocate [facility] Patient Advocate Team Direct Number: [phone]  Fax: [phone]

## 2023-02-02 NOTE — Patient Instructions (Signed)
STOP flecainide as of today   Decrease losartan to 1/2 tablet daily   Start potassium once a day

## 2023-02-02 NOTE — Telephone Encounter (Signed)
Spoke with Pt via Stat call. Pt has been in Afib since yesterday morning at 11 am. Pt states he is dizzy/lightheaded and has zero energy.  States he has taken his diltiazem and has been taking his eliquis daily.  BP this morning was 111/74 and HR 134. Stated he has had 3 ablations and has been fine since the last one up until his ED visit a few weeks ago. Was able to get him a appointment with Afib clinic today (11/15) at 9:30am. Pt took the appointment and was grateful.

## 2023-02-05 ENCOUNTER — Encounter (HOSPITAL_COMMUNITY)
Admission: RE | Admit: 2023-02-05 | Discharge: 2023-02-05 | Disposition: A | Discharge status: Home / Self Care | Payer: Medicare Other | Source: Ambulatory Visit | Attending: Urology | Admitting: Urology

## 2023-02-05 ENCOUNTER — Telehealth: Payer: Self-pay

## 2023-02-05 DIAGNOSIS — C61 Malignant neoplasm of prostate: Secondary | ICD-10-CM | POA: Insufficient documentation

## 2023-02-05 DIAGNOSIS — R9721 Rising PSA following treatment for malignant neoplasm of prostate: Secondary | ICD-10-CM | POA: Insufficient documentation

## 2023-02-05 MED ORDER — FLOTUFOLASTAT F 18 GALLIUM 296-5846 MBQ/ML IV SOLN
8.3700 | Freq: Once | INTRAVENOUS | Status: AC
  Administered 2023-02-05: 8.37 via INTRAVENOUS

## 2023-02-05 NOTE — Telephone Encounter (Signed)
Transition Care Management Unsuccessful Follow-up Telephone Call  Date of discharge and from where:  [facility] 11/1  Attempts:  2nd Attempt  Reason for unsuccessful TCM follow-up call:  No answer/busy   Derrek [facility]  [facility], [facility] Guide, Phone: [phone] Website: [email or web address]

## 2023-02-06 ENCOUNTER — Ambulatory Visit (HOSPITAL_BASED_OUTPATIENT_CLINIC_OR_DEPARTMENT_OTHER)
Admission: RE | Admit: 2023-02-06 | Discharge: 2023-02-06 | Disposition: A | Discharge status: Home / Self Care | Payer: Medicare Other | Source: Ambulatory Visit | Attending: Physician Assistant | Admitting: Physician Assistant

## 2023-02-06 ENCOUNTER — Inpatient Hospital Stay (HOSPITAL_COMMUNITY)
Admission: AD | Admit: 2023-02-06 | Discharge: 2023-02-09 | DRG: 309 | Disposition: A | Discharge status: Home / Self Care | Payer: Medicare Other | Source: Ambulatory Visit | Attending: Cardiology | Admitting: Cardiology

## 2023-02-06 ENCOUNTER — Other Ambulatory Visit: Payer: Self-pay

## 2023-02-06 ENCOUNTER — Encounter (HOSPITAL_COMMUNITY): Payer: Self-pay | Admitting: Cardiology

## 2023-02-06 VITALS — BP 132/60 | HR 63 | Ht 70.0 in | Wt 218.4 lb

## 2023-02-06 DIAGNOSIS — Z86718 Personal history of other venous thrombosis and embolism: Secondary | ICD-10-CM

## 2023-02-06 DIAGNOSIS — Z79899 Other long term (current) drug therapy: Secondary | ICD-10-CM | POA: Diagnosis not present

## 2023-02-06 DIAGNOSIS — I700 Atherosclerosis of aorta: Secondary | ICD-10-CM | POA: Diagnosis present

## 2023-02-06 DIAGNOSIS — I484 Atypical atrial flutter: Principal | ICD-10-CM | POA: Diagnosis present

## 2023-02-06 DIAGNOSIS — Z8546 Personal history of malignant neoplasm of prostate: Secondary | ICD-10-CM | POA: Diagnosis not present

## 2023-02-06 DIAGNOSIS — I10 Essential (primary) hypertension: Secondary | ICD-10-CM | POA: Diagnosis present

## 2023-02-06 DIAGNOSIS — I480 Paroxysmal atrial fibrillation: Secondary | ICD-10-CM | POA: Diagnosis not present

## 2023-02-06 DIAGNOSIS — I4819 Other persistent atrial fibrillation: Secondary | ICD-10-CM | POA: Diagnosis present

## 2023-02-06 DIAGNOSIS — D6869 Other thrombophilia: Secondary | ICD-10-CM | POA: Insufficient documentation

## 2023-02-06 DIAGNOSIS — E785 Hyperlipidemia, unspecified: Secondary | ICD-10-CM | POA: Diagnosis present

## 2023-02-06 DIAGNOSIS — J45909 Unspecified asthma, uncomplicated: Secondary | ICD-10-CM | POA: Diagnosis present

## 2023-02-06 DIAGNOSIS — K219 Gastro-esophageal reflux disease without esophagitis: Secondary | ICD-10-CM | POA: Diagnosis present

## 2023-02-06 DIAGNOSIS — Z7901 Long term (current) use of anticoagulants: Secondary | ICD-10-CM

## 2023-02-06 DIAGNOSIS — Z923 Personal history of irradiation: Secondary | ICD-10-CM | POA: Diagnosis not present

## 2023-02-06 LAB — BASIC METABOLIC PANEL
Anion gap: 4 — ABNORMAL LOW (ref 5–15)
Anion gap: 6 (ref 5–15)
BUN: 15 mg/dL (ref 8–23)
BUN: 16 mg/dL (ref 8–23)
CO2: 24 mmol/L (ref 22–32)
CO2: 24 mmol/L (ref 22–32)
Calcium: 8.9 mg/dL (ref 8.9–10.3)
Calcium: 9 mg/dL (ref 8.9–10.3)
Chloride: 108 mmol/L (ref 98–111)
Chloride: 106 mmol/L (ref 98–111)
Creatinine, Ser: 1.03 mg/dL (ref 0.61–1.24)
Creatinine, Ser: 1.19 mg/dL (ref 0.61–1.24)
GFR, Estimated: >60 mL/min (ref >60)
GFR, Estimated: >60 mL/min (ref >60)
Glucose, Bld: 98 mg/dL (ref 70–99)
Glucose, Bld: 142 mg/dL — ABNORMAL HIGH (ref 70–99)
Potassium: 3.7 mmol/L (ref 3.5–5.1)
Potassium: 3.8 mmol/L (ref 3.5–5.1)
Sodium: 136 mmol/L (ref 135–145)
Sodium: 136 mmol/L (ref 135–145)

## 2023-02-06 LAB — MAGNESIUM: Magnesium: 2.2 mg/dL (ref 1.7–2.4)

## 2023-02-06 MED ORDER — ALBUTEROL SULFATE (2.5 MG/3ML) 0.083% IN NEBU: 2.5000 mg | INHALATION_SOLUTION | Freq: Four times a day (QID) | RESPIRATORY_TRACT | Status: DC | PRN

## 2023-02-06 MED ORDER — POTASSIUM CHLORIDE CRYS ER 20 MEQ PO TBCR
60.0000 meq | EXTENDED_RELEASE_TABLET | Freq: Once | ORAL | Status: AC
  Administered 2023-02-06: 60 meq via ORAL
  Filled 2023-02-06: qty 3

## 2023-02-06 MED ORDER — NAPHAZOLINE-PHENIRAMINE 0.027-0.315 % OP SOLN: 1.0000 [drp] | OPHTHALMIC | Status: DC | PRN

## 2023-02-06 MED ORDER — POTASSIUM CHLORIDE CRYS ER 10 MEQ PO TBCR
10.0000 meq | EXTENDED_RELEASE_TABLET | Freq: Every day | ORAL | Status: DC
  Administered 2023-02-07: 10 meq via ORAL
  Filled 2023-02-06: qty 1

## 2023-02-06 MED ORDER — SODIUM CHLORIDE 0.9% FLUSH: 3.0000 mL | INTRAVENOUS | Status: DC | PRN

## 2023-02-06 MED ORDER — IPRATROPIUM BROMIDE 0.03 % NA SOLN: 2.0000 | Freq: Two times a day (BID) | NASAL | Status: DC

## 2023-02-06 MED ORDER — SODIUM CHLORIDE 0.9 % IV SOLN: 250.0000 mL | INTRAVENOUS | Status: AC | PRN

## 2023-02-06 MED ORDER — ATORVASTATIN CALCIUM 10 MG PO TABS
20.0000 mg | ORAL_TABLET | Freq: Every day | ORAL | Status: DC
  Administered 2023-02-07 – 2023-02-09 (×3): 20 mg via ORAL
  Filled 2023-02-06 (×3): qty 2

## 2023-02-06 MED ORDER — POTASSIUM CHLORIDE CRYS ER 20 MEQ PO TBCR
20.0000 meq | EXTENDED_RELEASE_TABLET | Freq: Once | ORAL | Status: AC
  Administered 2023-02-06: 20 meq via ORAL
  Filled 2023-02-06: qty 1

## 2023-02-06 MED ORDER — LOSARTAN POTASSIUM 25 MG PO TABS
25.0000 mg | ORAL_TABLET | Freq: Every day | ORAL | Status: DC
  Administered 2023-02-07 – 2023-02-09 (×3): 25 mg via ORAL
  Filled 2023-02-06 (×3): qty 1

## 2023-02-06 MED ORDER — PSYLLIUM 95 % PO PACK
1.0000 | PACK | Freq: Two times a day (BID) | ORAL | Status: DC
  Administered 2023-02-07 – 2023-02-09 (×5): 1 via ORAL
  Filled 2023-02-06 (×7): qty 1

## 2023-02-06 MED ORDER — DOFETILIDE 500 MCG PO CAPS
500.0000 ug | ORAL_CAPSULE | Freq: Two times a day (BID) | ORAL | Status: DC
  Administered 2023-02-06 – 2023-02-09 (×6): 500 ug via ORAL
  Filled 2023-02-06 (×6): qty 1

## 2023-02-06 MED ORDER — LORATADINE 10 MG PO TABS
10.0000 mg | ORAL_TABLET | Freq: Every day | ORAL | Status: DC
  Administered 2023-02-07 – 2023-02-09 (×3): 10 mg via ORAL
  Filled 2023-02-06 (×3): qty 1

## 2023-02-06 MED ORDER — ACETAMINOPHEN 500 MG PO TABS: 500.0000 mg | ORAL_TABLET | Freq: Three times a day (TID) | ORAL | Status: DC | PRN

## 2023-02-06 MED ORDER — DILTIAZEM HCL 30 MG PO TABS: 30.0000 mg | ORAL_TABLET | ORAL | Status: DC | PRN

## 2023-02-06 MED ORDER — MONTELUKAST SODIUM 10 MG PO TABS
10.0000 mg | ORAL_TABLET | Freq: Every day | ORAL | Status: DC
  Administered 2023-02-07 – 2023-02-08 (×2): 10 mg via ORAL
  Filled 2023-02-06 (×3): qty 1

## 2023-02-06 MED ORDER — PANTOPRAZOLE SODIUM 40 MG PO TBEC
40.0000 mg | DELAYED_RELEASE_TABLET | Freq: Two times a day (BID) | ORAL | Status: DC
  Administered 2023-02-07 – 2023-02-09 (×5): 40 mg via ORAL
  Filled 2023-02-06 (×5): qty 1

## 2023-02-06 MED ORDER — AZELASTINE HCL 0.1 % NA SOLN
1.0000 | Freq: Two times a day (BID) | NASAL | Status: DC
  Filled 2023-02-06: qty 30

## 2023-02-06 MED ORDER — IPRATROPIUM BROMIDE 0.06 % NA SOLN
2.0000 | Freq: Every day | NASAL | Status: DC
  Filled 2023-02-06: qty 15

## 2023-02-06 MED ORDER — SODIUM CHLORIDE 0.9% FLUSH
3.0000 mL | Freq: Two times a day (BID) | INTRAVENOUS | Status: DC
  Administered 2023-02-07 – 2023-02-09 (×5): 3 mL via INTRAVENOUS

## 2023-02-06 MED ORDER — MOMETASONE FUROATE 100 MCG/ACT IN AERO
1.0000 | INHALATION_SPRAY | Freq: Every evening | RESPIRATORY_TRACT | Status: DC
  Administered 2023-02-07: 1 via RESPIRATORY_TRACT
  Filled 2023-02-06: qty 13

## 2023-02-06 MED ORDER — APIXABAN 5 MG PO TABS
5.0000 mg | ORAL_TABLET | Freq: Two times a day (BID) | ORAL | Status: DC
  Administered 2023-02-07 – 2023-02-09 (×5): 5 mg via ORAL
  Filled 2023-02-06 (×5): qty 1

## 2023-02-06 MED ORDER — VITAMIN D 25 MCG (1000 UNIT) PO TABS
1000.0000 [IU] | ORAL_TABLET | Freq: Every day | ORAL | Status: DC
  Administered 2023-02-07 – 2023-02-09 (×3): 1000 [IU] via ORAL
  Filled 2023-02-06 (×3): qty 1

## 2023-02-06 MED ORDER — DILTIAZEM HCL ER COATED BEADS 180 MG PO CP24: 180.0000 mg | ORAL_CAPSULE | Freq: Every day | ORAL | Status: DC

## 2023-02-06 MED ORDER — ALBUTEROL SULFATE HFA 108 (90 BASE) MCG/ACT IN AERS: 1.0000 | INHALATION_SPRAY | Freq: Four times a day (QID) | RESPIRATORY_TRACT | Status: DC | PRN

## 2023-02-06 MED ORDER — IPRATROPIUM BROMIDE 0.06 % NA SOLN
2.0000 | Freq: Every day | NASAL | Status: DC
  Administered 2023-02-07 – 2023-02-08 (×2): 2 via NASAL
  Filled 2023-02-06: qty 15

## 2023-02-06 MED ORDER — FLUTICASONE PROPIONATE 50 MCG/ACT NA SUSP
1.0000 | Freq: Every day | NASAL | Status: DC
  Administered 2023-02-07 – 2023-02-09 (×3): 1 via NASAL
  Filled 2023-02-06: qty 16

## 2023-02-06 NOTE — Progress Notes (Signed)
Pharmacy: Dofetilide (Tikosyn) - Initial Consult Assessment and Electrolyte Replacement  Pharmacy consulted to assist in monitoring and replacing electrolytes in this 77 y.o. male admitted on 02/06/2023 undergoing dofetilide initiation. First dofetilide dose: 500 mcg once potassium has been replaced  Assessment:  Patient Exclusion Criteria: If any screening criteria checked as "Yes", then  patient  should NOT receive dofetilide until criteria item is corrected.  If "Yes" please indicate correction plan.  YES  NO Patient  Exclusion Criteria Correction Plan   []   [x]   Baseline QTc interval is greater than or equal to 440 msec. IF above YES box checked dofetilide contraindicated unless patient has ICD; then may proceed if QTc 500-550 msec or with known ventricular conduction abnormalities may proceed with QTc 550-600 msec. QTc = 0.33    []   [x]   Patient is known or suspected to have a digoxin level greater than 2 ng/ml: No results found for: "DIGOXIN"     []   [x]   Creatinine clearance less than 20 ml/min (calculated using Cockcroft-Gault, actual body weight and serum creatinine): Estimated Creatinine Clearance: 69.2 mL/min (by C-G formula based on SCr of 1.03 mg/dL).     [x]   []  Patient has received drugs known to prolong the QT intervals within the last 48 hours (phenothiazines, tricyclics or tetracyclic antidepressants, erythromycin, H-1 antihistamines, cisapride, fluoroquinolones, azithromycin, ondansetron).   Updated information on QT prolonging agents is available to be searched on the following database:QT prolonging agents     []   [x]  Patient received a dose of a thiazide diuretic in the last 48 hours [including hydrochlorothiazide (Oretic) alone or in any combination including triamterene (Dyazide, Maxzide)].    []   [x]  Patient received a medication known to increase dofetilide plasma concentrations prior to initial dofetilide dose:  Trimethoprim (Primsol, Proloprim) in  the last 36 hours Verapamil (Calan, Verelan) in the last 36 hours or a sustained release dose in the last 72 hours Megestrol (Megace) in the last 5 days  Cimetidine (Tagamet) in the last 6 hours Ketoconazole (Nizoral) in the last 24 hours Itraconazole (Sporanox) in the last 48 hours  Prochlorperazine (Compazine) in the last 36 hours     []   [x]   Patient is known to have a history of torsades de pointes; congenital or acquired long QT syndromes.    []   [x]   Patient has received a Class 1 antiarrhythmic with less than 2 half-lives since last dose. (Disopyramide, Quinidine, Procainamide, Lidocaine, Mexiletine, Flecainide, Propafenone)    []   [x]   Patient has received amiodarone therapy in the past 3 months or amiodarone level is greater than 0.3 ng/ml.    Labs:    Component Value Date/Time   K 3.7 02/06/2023 1018   MG 2.2 02/06/2023 1018     Plan: Select One Calculated CrCl  Dose q12h  [x]  > 60 ml/min 500 mcg  []  40-60 ml/min 250 mcg  []  20-40 ml/min 125 mcg   [x]   Physician selected initial dose within range recommended for patients level of renal function - will monitor for response.  []   Physician selected initial dose outside of range recommended for patients level of renal function - will discuss if the dose should be altered at this time.   Patient has been appropriately anticoagulated with Eliquis  Potassium: K 3.5-3.7:  Hold Tikosyn initiation and give KCl 60 mEq po x1 and repeat BMET 2hr after dose - repeat appropriate dose if K < 4    Magnesium: Mg >2: Appropriate to initiate Tikosyn, no  replacement needed     Thank you for allowing pharmacy to participate in this patient's care   Toniann Fail Catheline Hixon 02/06/2023  7:09 PM

## 2023-02-06 NOTE — Progress Notes (Signed)
Initial dose of Tikosyn administration delayed per pharmacy d/t K<4. Pt received per orders and has timed collect BMP scheduled for 2130.

## 2023-02-06 NOTE — Progress Notes (Addendum)
Patient direct admit from EP clinic for tikasyn loading in the setting of PAF. He has had prior ablations x 3 most recently 11/2021. Followed by EP clinic and Dr Elberta Fortis with plans to initiate tikasyn this admission. K is 3.7 Cr 1.03, Mg 2.2. Already has order to replace his K orally. Baseline QTc is 388  Initiate tikasyn loading per EP protocol. HR 50s on my evaluation. Tikasyn reverse use dependent, I have held his diltiazem 180mg    Dina Rich MD

## 2023-02-06 NOTE — Progress Notes (Signed)
Primary Care Physician: Garlan Fillers, MD Primary Cardiologist: Rollene Rotunda, MD Electrophysiologist: Will Jorja Loa, MD  Referring Physician: Dr Florentina Addison is a 77 y.o. male with a history of aortic atherosclerosis, HTN, prostate cancer, atrial fibrillation who presents for follow up in the [facility]. He is post ablation x 3 on 019 2020, 11/11/2019, 12/14/2021.  He is currently on flecainide 100 mg twice daily. Patient is on Eliquis for a CHADS2VASC score of 4.  On follow up today, patient presents for dofetilide admission. He denies any missed doses of anticoagulation in the past 3 weeks. He has discontinued flecainide. He converted back to SR after taking a dose of PRN diltiazem after his last visit. No bleeding issues on anticoagulation.   Today, he denies symptoms of palpitations, chest pain, shortness of breath, orthopnea, PND, lower extremity edema, presyncope, syncope, snoring, daytime somnolence, bleeding, or neurologic sequela. The patient is tolerating medications without difficulties and is otherwise without complaint today.    Atrial Fibrillation Risk Factors:  he does not have symptoms or diagnosis of sleep apnea. he does not have a history of rheumatic fever.   Atrial Fibrillation Management history:  Previous antiarrhythmic drugs: flecainide Previous cardioversions: 2 remotely  Previous ablations: 03/28/18, 11/11/19, 12/14/21 Anticoagulation history: Eliquis  ROS- All systems are reviewed and negative except as per the HPI above.  Past Medical History:  Diagnosis Date   Aortic atherosclerosis (HCC)    Arthritis    Asthma    Diverticulitis    Diverticulosis    DVT (deep venous thrombosis) (HCC) 2019   right leg after back surgery   Dysrhythmia    A-fib every few weeks   Ectatic thoracic aorta (HCC)    a. 4.3cm by CT 03/2018.   GERD (gastroesophageal reflux disease)    Hemorrhoids    History of  echocardiogram    a. Echo 10/16 [facility]):  EF 55%, trace MR, mild TR, mild to mod AI, mild dilated Ao root (41 mm)   Hyperlipemia    Organic erectile dysfunction    PAF (paroxysmal atrial fibrillation) (HCC)    a. admx to [facility] in [place], [place] 16/10 with AF with RVR >> converted to NSR with IV Dilt;  b. Flecainide started >> ETT neg for pro-arrhythmia    Pneumonia 06/2020   Prostate cancer (HCC) 04/25/2006   Gleason 3+4=7   Prostatitis    S/P cardiac cath    a. LHC at [facility] 10/16:  LM ok, mLAD 30%, LCx ok, dRCA 20%   S/P radiation therapy 01/19/2014 through 03/05/2014                                                      Prostate bed 6600 cGy in 33 sessions                           Torn rotator cuff    right  40% tear    Current Outpatient Medications  Medication Sig Dispense Refill   acetaminophen (TYLENOL) 500 MG tablet Take 500 mg by mouth at bedtime.     apixaban (ELIQUIS) 5 MG TABS tablet Take 5 mg by mouth 2 (two) times daily.      Ascorbic Acid (  VITAMIN C PO) Take by mouth. Taking 1 chewable by mouth daily- 500mg      atorvastatin (LIPITOR) 20 MG tablet Take 20 mg by mouth daily.     Azelastine HCl 137 MCG/SPRAY SOLN 1 puff in each nostril Nasally Twice a day for 30 days     diltiazem (CARDIZEM CD) 180 MG 24 hr capsule TAKE 1 CAPSULE (180 MG TOTAL) BY MOUTH DAILY IN THE AFTERNOON. 90 capsule 3   diltiazem (CARDIZEM) 30 MG tablet Take 1 tablet every 4 hours AS NEEDED for afib rapid heart rate over 100 60 tablet 1   EPINEPHrine 0.3 mg/0.3 mL IJ SOAJ injection Inject 0.3 mg into the muscle as needed for anaphylaxis.     fexofenadine (ALLEGRA ALLERGY) 180 MG tablet Take 1 tablet by mouth daily.     fluticasone (FLONASE) 50 MCG/ACT nasal spray Place 1 spray into both nostrils daily.     GLUCOSAMINE-CHONDROITIN PO Take 1,500 mg by mouth daily.     ipratropium (ATROVENT) 0.06 % nasal spray Place 2 sprays into both nostrils at bedtime.     losartan (COZAAR)  50 MG tablet Take 25 mg by mouth daily.     Mometasone Furoate (ASMANEX HFA) 100 MCG/ACT AERO Inhale 1 puff into the lungs every evening.     montelukast (SINGULAIR) 10 MG tablet Take 10 mg by mouth at bedtime.     Multiple Vitamin (MULTIVITAMIN ADULT PO) Take by mouth. L-Lycine- Taking 1 tablet by mouth daily - 500mg      Naphazoline-Pheniramine (OPCON-A) 0.027-0.315 % SOLN Place 1 drop into both eyes as needed (eye allergies (red/irritated eyes)).     NON FORMULARY Allergy Shots every 3 weeks     omeprazole (PRILOSEC) 20 MG capsule Take 20 mg by mouth 2 (two) times daily before a meal.      potassium chloride (KLOR-CON M) 10 MEQ tablet Take 1 tablet (10 mEq total) by mouth daily. 30 tablet 3   Probiotic Product (ALIGN) 4 MG CAPS Take 4 mg by mouth every evening.     psyllium (METAMUCIL) 58.6 % powder Taking 2 tsp twice daily     VENTOLIN HFA 108 (90 Base) MCG/ACT inhaler Inhale 1-2 puffs into the lungs every 6 (six) hours as needed for wheezing or shortness of breath.     VITAMIN D PO Take 1,000 Units by mouth daily. Taking 1 gel capsule daily     No current facility-administered medications for this encounter.    Physical Exam: BP 132/60   Pulse 63   Ht 5\' 10"  (1.778 m)   Wt 99.1 kg   BMI 31.34 kg/m   GEN: Well nourished, well developed in no acute distress NECK: No JVD; No carotid bruits CARDIAC: Regular rate and rhythm, no murmurs, rubs, gallops RESPIRATORY:  Clear to auscultation without rales, wheezing or rhonchi  ABDOMEN: Soft, non-tender, non-distended EXTREMITIES:  No edema; No deformity    Wt Readings from Last 3 Encounters:  02/06/23 99.1 kg  02/02/23 98 kg  01/30/23 100.2 kg     EKG today demonstrates  SR Vent. rate 63 BPM PR interval 176 ms QRS duration 90 ms QT/QTcB 380/388 ms  Echo 08/24/18 demonstrated   1. The left ventricle has normal systolic function with an ejection  fraction of 60-65%. The cavity size was normal. Left ventricular diastolic  Doppler  parameters are consistent with pseudonormalization.   2. The right ventricle has normal systolic function. The cavity was  normal. There is no increase in right ventricular wall thickness.  3. No evidence of mitral valve stenosis.   4. Aortic valve regurgitation is mild by color flow Doppler. No stenosis  of the aortic valve.   5. There is mild dilatation of the aortic root and of the ascending  aorta.   6. The interatrial septum was not assessed.    CHA2DS2-VASc Score = 4  The patient's score is based upon: CHF History: 0 HTN History: 1 Diabetes History: 0 Stroke History: 0 Vascular Disease History: 1 (aortic atherosclerosis) Age Score: 2 Gender Score: 0       ASSESSMENT AND PLAN: Paroxysmal Atrial Fibrillation (ICD10:  I48.0) The patient's CHA2DS2-VASc score is 4, indicating a 4.8% annual risk of stroke.   S/p ablation 03/2018, 11/11/19, 12/14/21 Previously failed flecainide  Patient presents for dofetilide admission. Continue Eliquis 5 mg BID, states no missed doses in the last 3 weeks. No recent benadryl use PharmD has screened medications QTc in SR 388-419 ms Labs today show creatinine at 1.03, K+ 3.7 and mag 2.2, CrCl calculated at 82 mL/min Continue diltiazem 180 mg daily with 30 mg PRN q 4 hours for heart racing  Secondary Hypercoagulable State (ICD10:  D68.69) The patient is at significant risk for stroke/thromboembolism based upon his CHA2DS2-VASc Score of 4.  Continue Apixaban (Eliquis).   HTN Stable on current regimen Will keep losartan 25 mg daily for now.    To be admitted once a be becomes available.       Jorja Loa PA-C Afib Clinic [facility] [address] [place], [place] [address] [phone]

## 2023-02-07 ENCOUNTER — Other Ambulatory Visit: Payer: Self-pay

## 2023-02-07 DIAGNOSIS — I480 Paroxysmal atrial fibrillation: Secondary | ICD-10-CM | POA: Diagnosis not present

## 2023-02-07 LAB — BASIC METABOLIC PANEL
Anion gap: 7 (ref 5–15)
BUN: 14 mg/dL (ref 8–23)
CO2: 22 mmol/L (ref 22–32)
Calcium: 9.1 mg/dL (ref 8.9–10.3)
Chloride: 112 mmol/L — ABNORMAL HIGH (ref 98–111)
Creatinine, Ser: 0.95 mg/dL (ref 0.61–1.24)
GFR, Estimated: >60 mL/min (ref >60)
Glucose, Bld: 92 mg/dL (ref 70–99)
Potassium: 4 mmol/L (ref 3.5–5.1)
Sodium: 141 mmol/L (ref 135–145)

## 2023-02-07 LAB — MAGNESIUM: Magnesium: 2.3 mg/dL (ref 1.7–2.4)

## 2023-02-07 MED ORDER — ZOLPIDEM TARTRATE 5 MG PO TABS
5.0000 mg | ORAL_TABLET | Freq: Every evening | ORAL | Status: DC | PRN
  Administered 2023-02-07 – 2023-02-08 (×3): 5 mg via ORAL
  Filled 2023-02-07 (×3): qty 1

## 2023-02-07 NOTE — Progress Notes (Signed)
Post dose EKG is reviewed QTc remains stable OK to continue Tikosyn dose/load tonight  [name], PA-C

## 2023-02-07 NOTE — Progress Notes (Signed)
Post Tikosyn EKG shows SB HR 50 QTc 388. Pt also requesting something for sleep, states he takes Ambien 5mg  PRN but not ordered here. Per Fayrene Fearing [facility] it is ok to take Ambien with Tikosyn. Provider on call paged via amion. Dierdre Highman, RN

## 2023-02-07 NOTE — TOC Initial Note (Signed)
Transition of Care [facility]) - Initial/Assessment Note    Patient Details  Name: PERETZ RODAS MRN: [id number] Date of Birth: 11-26-1945  Transition of Care [facility]) CM/SW Contact:    Lawerance Sabal, RN Phone Number: 02/07/2023, 8:58 AM  Clinical Narrative:                  Spoke w patient's wife at bedside.  Discussed Tikosyn initiation.  Copay is 84.18 due to donut hole.  Agreeable to initial fill through [facility] pharmacy and for refills to be sent to:  [facility] Address: [address], [place], [place] [address] Phone: [phone]  Expected Discharge Plan: Home/Self Care Barriers to Discharge: Continued Medical Work up   Patient Goals and CMS Choice Patient states their goals for this hospitalization and ongoing recovery are:: to go home          Expected Discharge Plan and Services   Discharge Planning Services: CM Consult, Medication Assistance Post Acute Care Choice: NA Living arrangements for the past 2 months: Single Family Home                                      Prior Living Arrangements/Services Living arrangements for the past 2 months: Single Family Home Lives with:: Spouse                   Activities of Daily Living   ADL Screening (condition at time of admission) Independently performs ADLs?: Yes (appropriate for developmental age) Is the patient deaf or have difficulty hearing?: No Does the patient have difficulty seeing, even when wearing glasses/contacts?: No Does the patient have difficulty concentrating, remembering, or making decisions?: No  Permission Sought/Granted                  Emotional Assessment              Admission diagnosis:  Atypical atrial flutter (HCC) [I48.4] Patient Active Problem List   Diagnosis Date Noted   Hypercoagulable state due to paroxysmal atrial fibrillation (HCC) 02/02/2023   Atypical atrial flutter (HCC) 02/02/2023   Community acquired pneumonia 11/27/2021   Hyponatremia  11/27/2021   Hypokalemia 11/27/2021   Acute metabolic encephalopathy 11/27/2021   Atrial fibrillation (HCC) 08/24/2018   Mild renal insufficiency 08/24/2018   Mild aortic regurgitation 08/24/2018   Ascending aorta dilatation (HCC) 08/24/2018   Paroxysmal atrial fibrillation (HCC) 03/28/2018   Synovial cyst of lumbar facet joint 07/26/2017   PAF (paroxysmal atrial fibrillation) (HCC) 02/26/2015   Malignant neoplasm of prostate (HCC) 10/16/2013   PCP:  Garlan Fillers, MD Pharmacy:   [phone] [place], [place] - [address] [place] [address] Phone: [phone] Fax: [phone]  [facility] - [place], [place] - [address] [place] [address] Phone: [phone] Fax: [phone]     Social Determinants of Health (SDOH) Social History: SDOH Screenings   Food Insecurity: No Food Insecurity (02/06/2023)  Housing: Low Risk  (02/06/2023)  Transportation Needs: No Transportation Needs (02/06/2023)  Utilities: Not At Risk (02/06/2023)  Social Connections: Unknown (08/01/2021)   [facility] from [facility]  Tobacco Use: Medium Risk (02/06/2023)   SDOH Interventions:     Readmission Risk Interventions    11/29/2021   11:12 AM  Readmission Risk Prevention Plan  Transportation Screening Complete  PCP or Specialist Appt within 3-5 Days Complete  HRI or Home Care Consult Complete  Social Work Consult for Recovery Care Planning/Counseling Complete  Palliative Care Screening Not Applicable  Medication Review Oceanographer) Complete

## 2023-02-07 NOTE — Progress Notes (Signed)
Pharmacy: Dofetilide (Tikosyn) - Follow Up Assessment and Electrolyte Replacement  Pharmacy consulted to assist in monitoring and replacing electrolytes in this 77 y.o. male admitted on 02/06/2023 undergoing dofetilide initiation. First dofetilide dose: 02/06/23  Labs:    Component Value Date/Time   K 4.0 02/07/2023 0515   MG 2.3 02/07/2023 0515     Plan: Potassium: K >/= 4: No additional supplementation needed. Potassium chloride 10 mEq daily is currently ordered.  Magnesium: Mg > 2: No additional supplementation needed   Thank you for allowing pharmacy to participate in this patient's care   Wilmer Floor, PharmD PGY2 Cardiology Pharmacy Resident

## 2023-02-07 NOTE — H&P (Addendum)
Primary Care Physician: Garlan Fillers, MD Primary Cardiologist: Rollene Rotunda, MD Electrophysiologist: Yanin Muhlestein Jorja Loa, MD  Referring Physician: Dr Florentina Addison is a 77 y.o. male with a history of aortic atherosclerosis, HTN, prostate cancer, atrial fibrillation who presents for follow up in the [facility]. He is post ablation x 3 on 019 2020, 11/11/2019, 12/14/2021.  He is currently on flecainide 100 mg twice daily. Patient is on Eliquis for a CHADS2VASC score of 4.  On follow up today, patient presents for dofetilide admission. He denies any missed doses of anticoagulation in the past 3 weeks. He has discontinued flecainide. He converted back to SR after taking a dose of PRN diltiazem after his last visit. No bleeding issues on anticoagulation.   Today, he denies symptoms of palpitations, chest pain, shortness of breath, orthopnea, PND, lower extremity edema, presyncope, syncope, snoring, daytime somnolence, bleeding, or neurologic sequela. The patient is tolerating medications without difficulties and is otherwise without complaint today.    Atrial Fibrillation Risk Factors:  he does not have symptoms or diagnosis of sleep apnea. he does not have a history of rheumatic fever.   Atrial Fibrillation Management history:  Previous antiarrhythmic drugs: flecainide Previous cardioversions: 2 remotely  Previous ablations: 03/28/18, 11/11/19, 12/14/21 Anticoagulation history: Eliquis  ROS- All systems are reviewed and negative except as per the HPI above.  Past Medical History:  Diagnosis Date   Aortic atherosclerosis (HCC)    Arthritis    Asthma    Diverticulitis    Diverticulosis    DVT (deep venous thrombosis) (HCC) 2019   right leg after back surgery   Dysrhythmia    A-fib every few weeks   Ectatic thoracic aorta (HCC)    a. 4.3cm by CT 03/2018.   GERD (gastroesophageal reflux disease)    Hemorrhoids    History of  echocardiogram    a. Echo 10/16 [facility]):  EF 55%, trace MR, mild TR, mild to mod AI, mild dilated Ao root (41 mm)   Hyperlipemia    Organic erectile dysfunction    PAF (paroxysmal atrial fibrillation) (HCC)    a. admx to [facility] in [place], [place] 01/09 with AF with RVR >> converted to NSR with IV Dilt;  b. Flecainide started >> ETT neg for pro-arrhythmia    Pneumonia 06/2020   Prostate cancer (HCC) 04/25/2006   Gleason 3+4=7   Prostatitis    S/P cardiac cath    a. LHC at [facility] 10/16:  LM ok, mLAD 30%, LCx ok, dRCA 20%   S/P radiation therapy 01/19/2014 through 03/05/2014                                                      Prostate bed 6600 cGy in 33 sessions                           Torn rotator cuff    right  40% tear    Current Facility-Administered Medications  Medication Dose Route Frequency Provider Last Rate Last Admin   0.9 %  sodium chloride infusion  250 mL Intravenous PRN Tangie Stay Daphine Deutscher, MD       acetaminophen (TYLENOL) tablet 500 mg  500 mg Oral Q8H PRN Fenton,  Clint R, PA       albuterol (PROVENTIL) (2.5 MG/3ML) 0.083% nebulizer solution 2.5 mg  2.5 mg Nebulization Q6H PRN Georgeann Brinkman Daphine Deutscher, MD       apixaban Everlene Balls) tablet 5 mg  5 mg Oral BID Regan Lemming, MD   5 mg at 02/07/23 0805   atorvastatin (LIPITOR) tablet 20 mg  20 mg Oral Daily Fenton, Clint R, PA   20 mg at 02/07/23 0805   cholecalciferol (VITAMIN D3) 25 MCG (1000 UNIT) tablet 1,000 Units  1,000 Units Oral Daily Fenton, Clint R, PA   1,000 Units at 02/07/23 0805   diltiazem (CARDIZEM) tablet 30 mg  30 mg Oral Q4H PRN Fenton, Clint R, PA       dofetilide (TIKOSYN) capsule 500 mcg  500 mcg Oral BID Carlisle Enke Daphine Deutscher, MD   500 mcg at 02/07/23 0805   fluticasone (FLONASE) 50 MCG/ACT nasal spray 1 spray  1 spray Each Nare Daily Fenton, Clint R, PA   1 spray at 02/07/23 0810   ipratropium (ATROVENT) 0.06 % nasal spray 2 spray  2 spray Each Nare QHS Taisei Bonnette Daphine Deutscher,  MD       loratadine (CLARITIN) tablet 10 mg  10 mg Oral Daily Fenton, Clint R, PA   10 mg at 02/07/23 0805   losartan (COZAAR) tablet 25 mg  25 mg Oral Daily Fenton, Clint R, PA   25 mg at 02/07/23 0805   Mometasone Furoate AERO 1 puff  1 puff Inhalation QPM Fenton, Clint R, PA       montelukast (SINGULAIR) tablet 10 mg  10 mg Oral QHS Fenton, Clint R, PA       Naphazoline-Pheniramine 0.027-0.315 % SOLN 1 drop  1 drop Both Eyes PRN Fenton, Clint R, PA       pantoprazole (PROTONIX) EC tablet 40 mg  40 mg Oral BID AC Fenton, Clint R, PA   40 mg at 02/07/23 0805   potassium chloride (KLOR-CON M) CR tablet 10 mEq  10 mEq Oral Daily Fenton, Clint R, PA   10 mEq at 02/07/23 0805   psyllium (HYDROCIL/METAMUCIL) 1 packet  1 packet Oral BID Fenton, Clint R, PA   1 packet at 02/07/23 0806   sodium chloride flush (NS) 0.9 % injection 3 mL  3 mL Intravenous Q12H Lenord Fralix Daphine Deutscher, MD   3 mL at 02/07/23 0810   sodium chloride flush (NS) 0.9 % injection 3 mL  3 mL Intravenous PRN Elohim Brune Daphine Deutscher, MD       zolpidem (AMBIEN) tablet 5 mg  5 mg Oral QHS PRN Elmon Kirschner, MD   5 mg at 02/07/23 0105    Physical Exam: BP 124/69 (BP Location: Right Arm)   Pulse (!) 52   Temp 97.8 F (36.6 C) (Oral)   Resp 18   Ht 5\' 10"  (1.778 m)   Wt 97.5 kg   SpO2 97%   BMI 30.83 kg/m   GEN: Well nourished, well developed in no acute distress NECK: No JVD; No carotid bruits CARDIAC: Regular rate and rhythm, no murmurs, rubs, gallops RESPIRATORY:  Clear to auscultation without rales, wheezing or rhonchi  ABDOMEN: Soft, non-tender, non-distended EXTREMITIES:  No edema; No deformity    Wt Readings from Last 3 Encounters:  02/06/23 97.5 kg  02/06/23 99.1 kg  02/02/23 98 kg     EKG today demonstrates  SR Vent. rate 63 BPM PR interval 176 ms QRS duration 90 ms QT/QTcB 380/388 ms  Echo  08/24/18 demonstrated   1. The left ventricle has normal systolic function with an ejection  fraction of 60-65%. The  cavity size was normal. Left ventricular diastolic  Doppler parameters are consistent with pseudonormalization.   2. The right ventricle has normal systolic function. The cavity was  normal. There is no increase in right ventricular wall thickness.   3. No evidence of mitral valve stenosis.   4. Aortic valve regurgitation is mild by color flow Doppler. No stenosis  of the aortic valve.   5. There is mild dilatation of the aortic root and of the ascending  aorta.   6. The interatrial septum was not assessed.    CHA2DS2-VASc Score = 4  The patient's score is based upon: CHF History: 0 HTN History: 1 Diabetes History: 0 Stroke History: 0 Vascular Disease History: 1 (aortic atherosclerosis) Age Score: 2 Gender Score: 0       ASSESSMENT AND PLAN: Paroxysmal Atrial Fibrillation (ICD10:  I48.0) The patient's CHA2DS2-VASc score is 4, indicating a 4.8% annual risk of stroke.   S/p ablation 03/2018, 11/11/19, 12/14/21 Previously failed flecainide  Patient presents for dofetilide admission. Continue Eliquis 5 mg BID, states no missed doses in the last 3 weeks. No recent benadryl use PharmD has screened medications QTc in SR 388-419 ms Labs today show creatinine at 1.03, K+ 3.7 and mag 2.2, CrCl calculated at 82 mL/min Continue diltiazem 180 mg daily with 30 mg PRN q 4 hours for heart racing  Secondary Hypercoagulable State (ICD10:  D68.69) The patient is at significant risk for stroke/thromboembolism based upon his CHA2DS2-VASc Score of 4.  Continue Apixaban (Eliquis).   HTN Stable on current regimen Ellieana Dolecki keep losartan 25 mg daily for now.   ADDEND AFib clinic note as above to serve as H&P Pt arrived last night, took his PM dose of Eliquis at home, no missed doses (arrived and remains in SR) Pt feels well this morning  Post dose EKG SB 50bpm, QTc Tele SB/sinus arrhythmia, 50's mostly some 40's overnight/sleep  HEENT: [place]/AT Neck: no JVD, supple Cor: RRR Resp: CTA  b/l Extrem: no edema  Tikosyn load is in progress K+ 4.0 Mag 2.3 Creat 0.95 (stable) QTc stable Baseline asymptomatic bradycardia  [name] PA-C  I have seen and examined this patient with [name].  Agree with above, note added to reflect my findings.  Patient presented to the hospital with continued episodes of atrial fibrillation.  He has had multiple ablations.  He continues to have episodes that last for multiple hours at a time.  Presented to the hospital yesterday for admission for dofetilide load.  GEN: Well nourished, well developed, in no acute distress  HEENT: normal  Neck: no JVD, carotid bruits, or masses Cardiac: RRR; no murmurs, rubs, or gallops,no edema  Respiratory:  clear to auscultation bilaterally, normal work of breathing GI: soft, nontender, nondistended, + BS MS: no deformity or atrophy  Skin: warm and dry Neuro:  Strength and sensation are intact Psych: euthymic mood, full affect   Paroxysmal atrial fibrillation: Patient is not sinus rhythm currently.  He has had multiple episodes of atrial fibrillation despite ablation.  Undergoing dofetilide load.  QTc is remained stable.  Katelyn Broadnax continue with current management.  Oday Ridings M. Bowman Higbie MD 02/07/2023 10:18 AM

## 2023-02-08 ENCOUNTER — Encounter (HOSPITAL_COMMUNITY)
Admission: AD | Disposition: A | Discharge status: Home / Self Care | Payer: Self-pay | Source: Ambulatory Visit | Attending: Cardiology

## 2023-02-08 ENCOUNTER — Other Ambulatory Visit: Payer: Self-pay

## 2023-02-08 ENCOUNTER — Ambulatory Visit (HOSPITAL_COMMUNITY): Admission: RE | Admit: 2023-02-08 | Payer: Medicare Other | Source: Home / Self Care | Admitting: Cardiology

## 2023-02-08 DIAGNOSIS — I480 Paroxysmal atrial fibrillation: Secondary | ICD-10-CM | POA: Diagnosis not present

## 2023-02-08 LAB — BASIC METABOLIC PANEL
Anion gap: 5 (ref 5–15)
BUN: 15 mg/dL (ref 8–23)
CO2: 26 mmol/L (ref 22–32)
Calcium: 9.1 mg/dL (ref 8.9–10.3)
Chloride: 109 mmol/L (ref 98–111)
Creatinine, Ser: 1.22 mg/dL (ref 0.61–1.24)
GFR, Estimated: >60 mL/min (ref >60)
Glucose, Bld: 93 mg/dL (ref 70–99)
Potassium: 3.9 mmol/L (ref 3.5–5.1)
Sodium: 140 mmol/L (ref 135–145)

## 2023-02-08 LAB — CBC
HCT: 39.8 % (ref 39.0–52.0)
Hemoglobin: 13.5 g/dL (ref 13.0–17.0)
MCH: 31.5 pg (ref 26.0–34.0)
MCHC: 33.9 g/dL (ref 30.0–36.0)
MCV: 93 fL (ref 80.0–100.0)
Platelets: 141 10*3/uL — ABNORMAL LOW (ref 150–400)
RBC: 4.28 MIL/uL (ref 4.22–5.81)
RDW: 12 % (ref 11.5–15.5)
WBC: 5.7 10*3/uL (ref 4.0–10.5)
nRBC: 0 % (ref 0.0–0.2)

## 2023-02-08 LAB — MAGNESIUM: Magnesium: 2.3 mg/dL (ref 1.7–2.4)

## 2023-02-08 SURGERY — CARDIOVERSION (CATH LAB)
Anesthesia: General

## 2023-02-08 MED ORDER — POTASSIUM CHLORIDE CRYS ER 20 MEQ PO TBCR
20.0000 meq | EXTENDED_RELEASE_TABLET | Freq: Once | ORAL | Status: AC
  Administered 2023-02-08: 20 meq via ORAL
  Filled 2023-02-08: qty 1

## 2023-02-08 MED ORDER — POTASSIUM CHLORIDE CRYS ER 10 MEQ PO TBCR: 10.0000 meq | EXTENDED_RELEASE_TABLET | Freq: Every day | ORAL | Status: DC

## 2023-02-08 NOTE — Progress Notes (Signed)
Pharmacy: Dofetilide (Tikosyn) - Follow Up Assessment and Electrolyte Replacement  Pharmacy consulted to assist in monitoring and replacing electrolytes in this 77 y.o. male admitted on 02/06/2023 undergoing dofetilide initiation. First dofetilide dose: 02/06/23  Labs:    Component Value Date/Time   K 3.9 02/08/2023 0442   MG 2.3 02/08/2023 0442     Plan: Potassium: K 3.8-3.9:  Give KCl 20 mEq po x1, then continue 10 mEq daily  Magnesium: Mg > 2: No additional supplementation needed  Thank you for allowing pharmacy to participate in this patient's care   Wilmer Floor, PharmD PGY2 Cardiology Pharmacy Resident 02/08/2023  7:31 AM

## 2023-02-08 NOTE — Progress Notes (Addendum)
Rounding Note    Patient Name: Brandon Simmons Date of Encounter: 02/08/2023  [facility] Cardiologist: Rollene Rotunda, MD   Subjective   Feels well  Inpatient Medications    Scheduled Meds:  apixaban  5 mg Oral BID   atorvastatin  20 mg Oral Daily   cholecalciferol  1,000 Units Oral Daily   dofetilide  500 mcg Oral BID   fluticasone  1 spray Each Nare Daily   ipratropium  2 spray Each Nare QHS   loratadine  10 mg Oral Daily   losartan  25 mg Oral Daily   Mometasone Furoate  1 puff Inhalation QPM   montelukast  10 mg Oral QHS   pantoprazole  40 mg Oral BID AC   potassium chloride  10 mEq Oral Daily   psyllium  1 packet Oral BID   sodium chloride flush  3 mL Intravenous Q12H   Continuous Infusions:  PRN Meds: acetaminophen, albuterol, diltiazem, Naphazoline-Pheniramine, sodium chloride flush, zolpidem   Vital Signs    Vitals:   02/07/23 1559 02/07/23 2012 02/07/23 2017 02/08/23 0416  BP: 130/61 (!) 142/78  110/77  Pulse: (!) 52 (!) 57  (!) 51  Resp: 18 18  18   Temp: 98.5 F (36.9 C) 98.7 F (37.1 C)  98.1 F (36.7 C)  TempSrc: Oral Oral  Oral  SpO2: 98% 99% 100% 90%  Weight:      Height:       No intake or output data in the 24 hours ending 02/08/23 0729    02/06/2023    5:22 PM 02/06/2023    9:44 AM 02/02/2023    9:30 AM  Last 3 Weights  Weight (lbs) 214 lb 14.4 oz 218 lb 6.4 oz 216 lb  Weight (kg) 97.478 kg 99.066 kg 97.977 kg      Telemetry    SB 40's-50's, some 60's - Personally Reviewed  ECG    SB 55bpm, QTc - Personally Reviewed w/Dr. Elberta Fortis  Physical Exam   GEN: No acute distress.   Neck: No JVD Cardiac: RRR, no murmurs, rubs, or gallops.  Respiratory: CTA b/l GI: Soft, nontender, non-distended  MS: No edema; No deformity. Neuro:  Nonfocal  Psych: Normal affect   Labs    High Sensitivity Troponin:   Recent Labs  Lab 01/19/23 1240 01/19/23 1430  TROPONINIHS 8 8     Chemistry Recent Labs  Lab  02/06/23 1018 02/06/23 2141 02/07/23 0515 02/08/23 0442  NA 136 136 141 140  K 3.7 3.8 4.0 3.9  CL 108 106 112* 109  CO2 24 24 22 26   GLUCOSE 98 142* 92 93  BUN 15 16 14 15   CREATININE 1.03 1.19 0.95 1.22  CALCIUM 8.9 9.0 9.1 9.1  MG 2.2  --  2.3 2.3  GFRNONAA >60 >60 >60 >60  ANIONGAP 4* 6 7 5     Lipids No results for input(s): "CHOL", "TRIG", "HDL", "LABVLDL", "LDLCALC", "CHOLHDL" in the last 168 hours.  Hematology Recent Labs  Lab 02/08/23 0442  WBC 5.7  RBC 4.28  HGB 13.5  HCT 39.8  MCV 93.0  MCH 31.5  MCHC 33.9  RDW 12.0  PLT 141*   Thyroid No results for input(s): "TSH", "FREET4" in the last 168 hours.  BNPNo results for input(s): "BNP", "PROBNP" in the last 168 hours.  DDimer No results for input(s): "DDIMER" in the last 168 hours.   Radiology    No results found.  Cardiac Studies   Echo  08/24/18 demonstrated   1. The left ventricle has normal systolic function with an ejection  fraction of 60-65%. The cavity size was normal. Left ventricular diastolic  Doppler parameters are consistent with pseudonormalization.   2. The right ventricle has normal systolic function. The cavity was  normal. There is no increase in right ventricular wall thickness.   3. No evidence of mitral valve stenosis.   4. Aortic valve regurgitation is mild by color flow Doppler. No stenosis  of the aortic valve.   5. There is mild dilatation of the aortic root and of the ascending  aorta.   6. The interatrial septum was not assessed.   Patient Profile     77 y.o. male w/PMHx of prostate Ca, HTN, AFib (w/prior PVI ablations) admitted for Tikosyn load  Previously failed flecainide  Assessment & Plan    Persistent AFib CHA2DS2Vasc is 3, on Eliquis, appropriately dosed Tikosyn load is in progress K+ 3.9 Mag 2.3 Creat t1.22 (up some, remains appropriate), pt encouraged better PO intake QTc stable  Anticipate d/c tomorrow  HTN Home meds  3. Asymptomatic  bradycardia   For questions or updates, please contact [facility] Please consult [email or web address] for contact info under        Signed, Sheilah Pigeon, PA-C  02/08/2023, 7:29 AM    I have seen and examined this patient with [name].  Agree with above, note added to reflect my findings.  Feeling well without complaint.  GEN: Well nourished, well developed, in no acute distress  HEENT: normal  Neck: no JVD, carotid bruits, or masses Cardiac: RRR; no murmurs, rubs, or gallops,no edema  Respiratory:  clear to auscultation bilaterally, normal work of breathing GI: soft, nontender, nondistended, + BS MS: no deformity or atrophy  Skin: warm and dry Neuro:  Strength and sensation are intact Psych: euthymic mood, full affect   Paroxymal atrial fibrillation. Tikosyn load in progress. Remains in sinus rhythm. Feeling well. Continue current management. On eliquis for CHADS2VASc of 3. No plan for DCCV. Likely Jazari Ober plan for discharge tomorrow.   Taahir Grisby M. Xan Sparkman MD 02/08/2023 7:44 AM

## 2023-02-08 NOTE — Progress Notes (Signed)
Post dose EKG reviewed. QTc remains stable Continue Tikosyn  load [name], PA-C

## 2023-02-09 ENCOUNTER — Other Ambulatory Visit: Payer: Self-pay

## 2023-02-09 ENCOUNTER — Other Ambulatory Visit (HOSPITAL_COMMUNITY): Payer: Self-pay

## 2023-02-09 DIAGNOSIS — I480 Paroxysmal atrial fibrillation: Secondary | ICD-10-CM | POA: Diagnosis not present

## 2023-02-09 LAB — CBC
HCT: 42.1 % (ref 39.0–52.0)
Hemoglobin: 14.1 g/dL (ref 13.0–17.0)
MCH: 31.3 pg (ref 26.0–34.0)
MCHC: 33.5 g/dL (ref 30.0–36.0)
MCV: 93.3 fL (ref 80.0–100.0)
Platelets: 143 10*3/uL — ABNORMAL LOW (ref 150–400)
RBC: 4.51 MIL/uL (ref 4.22–5.81)
RDW: 12 % (ref 11.5–15.5)
WBC: 6.7 10*3/uL (ref 4.0–10.5)
nRBC: 0 % (ref 0.0–0.2)

## 2023-02-09 LAB — BASIC METABOLIC PANEL
Anion gap: 5 (ref 5–15)
BUN: 17 mg/dL (ref 8–23)
CO2: 25 mmol/L (ref 22–32)
Calcium: 9.3 mg/dL (ref 8.9–10.3)
Chloride: 108 mmol/L (ref 98–111)
Creatinine, Ser: 1.16 mg/dL (ref 0.61–1.24)
GFR, Estimated: >60 mL/min (ref >60)
Glucose, Bld: 97 mg/dL (ref 70–99)
Potassium: 3.9 mmol/L (ref 3.5–5.1)
Sodium: 138 mmol/L (ref 135–145)

## 2023-02-09 LAB — MAGNESIUM: Magnesium: 2.3 mg/dL (ref 1.7–2.4)

## 2023-02-09 MED ORDER — POTASSIUM CHLORIDE CRYS ER 10 MEQ PO TBCR: 10.0000 meq | EXTENDED_RELEASE_TABLET | Freq: Every day | ORAL | Status: DC

## 2023-02-09 MED ORDER — POTASSIUM CHLORIDE CRYS ER 20 MEQ PO TBCR
30.0000 meq | EXTENDED_RELEASE_TABLET | Freq: Every day | ORAL | 5 refills | Status: DC
  Filled 2023-02-09: qty 45, 30d supply, fill #0

## 2023-02-09 MED ORDER — POTASSIUM CHLORIDE CRYS ER 20 MEQ PO TBCR
20.0000 meq | EXTENDED_RELEASE_TABLET | Freq: Once | ORAL | Status: AC
  Administered 2023-02-09: 20 meq via ORAL
  Filled 2023-02-09: qty 1

## 2023-02-09 MED ORDER — POTASSIUM CHLORIDE CRYS ER 10 MEQ PO TBCR
10.0000 meq | EXTENDED_RELEASE_TABLET | Freq: Every day | ORAL | 2 refills | Status: DC
  Filled 2023-02-09: qty 30, 30d supply, fill #0

## 2023-02-09 MED ORDER — DOFETILIDE 500 MCG PO CAPS
500.0000 ug | ORAL_CAPSULE | Freq: Two times a day (BID) | ORAL | 1 refills | Status: DC
  Filled 2023-02-09: qty 60, 30d supply, fill #0

## 2023-02-09 NOTE — Progress Notes (Signed)
Post dose EKG reviewed with Dr. Elberta Fortis, stable QTc He has seen the patient this morning Anticipate discharge this afternoon  [name], PA-C

## 2023-02-09 NOTE — Progress Notes (Signed)
Discharge instructions reviewed with pt and his wife.  Copy of instructions given to pt. [facility] filling his scripts and will be ready in the next 30 minutes per pharmacy and will be picked up on the way out for discharge. Wife is at bedside, pt getting dressed, will d/c when meds are ready.   Ptwill be  d/c'd via wheelchair with belongings, with his wife, will be  escorted by staff/hospital volunteer.   Casey Maxfield,RN SWOT

## 2023-02-09 NOTE — Discharge Instructions (Addendum)
[name],  The resource we recommended for you to use to determine drugs that are at risk of interacting with your new Tikosyn (dofetolide) regimen is called Credible Meds. I am attaching a description below. The URL address is GamingCloset.fr. You may need to create an account to use the list, but it is free and you should not have to pay for it.  Best,  Wilmer Floor, PharmD PGY2 Cardiology Pharmacy Resident  CredibleMeds is an online resource that promotes the safe use of medicines.  It was created and is maintained by [employer], a non-profit 501(c)3 organization.  [employer] was founded in 1999 as the university-based, Proofreader (CERT), with a mission to foster the safe use of medicines. In 1191, CERT became [employer] or [employer], an independent non-profit organization incorporated in [place]. [employer]'s website was renamed CredibleMeds in 2014 to better reflect its scope and global recognition as a trusted resource on safe medication use for healthcare providers, research scientists and the public. [employer] maintains CredibleMeds' databases and it's many resources and is best known for the QTdrugs List of drugs that have a risk of QT prolongation and a life-threatening cardiac arrhythmia known as torsades de pointes (TdP).  Under a contract with the FDA's Safe Use Initiative, [employer] developed and launched hospital-based clinical decision support systems that are now being expanded for all clinical environments and available for use online at [employer].

## 2023-02-09 NOTE — Care Management Important Message (Signed)
Important Message  Patient Details  Name: Brandon Simmons MRN: [id number] Date of Birth: 1945-10-21   Important Message Given:  Yes - Medicare IM     Sherilyn Banker 02/09/2023, 9:20 AM

## 2023-02-09 NOTE — Discharge Summary (Cosign Needed)
ELECTROPHYSIOLOGY PROCEDURE DISCHARGE SUMMARY    Patient ID: Brandon Simmons,  MRN: [id number], DOB/AGE: 27-Mar-1945 77 y.o.  Admit date: 02/06/2023 Discharge date: 02/09/2023  Primary Care Physician: Garlan Fillers, MD Primary Cardiologist: Dr. Antoine Poche Electrophysiologist: Dr. Elberta Fortis  Primary Discharge Diagnosis:  1.  persistent atrial fibrillation status post Tikosyn loading this admission      CHA2DS2Vasc is 3, on Eliquis  Secondary Discharge Diagnosis:  HTN Asymptomatic bradycardia  No Known Allergies   Procedures This Admission:  1.  Tikosyn loading   Brief HPI: Brandon Simmons is a 77 y.o. male with a past medical history as noted above.  Followed by EP in the outpatient setting for treatment options of atrial fibrillation.  Risks, benefits, and alternatives to Tikosyn were reviewed with the patient who wished to proceed.    Hospital Course:  The patient was admitted and Tikosyn was initiated.  Renal function and electrolytes were followed during the hospitalization.  The patient's QTc remained stable. He arrived and maintained SR/SB, monitored on telemetry throughout his stay.  HRs 40's nocturnal and 50's daytime mostly and his home diltiazem stopped.  On the day of discharge, he feels well, was examined by Dr Elberta Fortis who considered the patient stable for discharge to home.  Follow-up has been arranged with the AFib clinic in 1 week and with the EP team in 4 weeks.   Tikosyn teaching was completed electrolyte replacement for home will be to increase his daily to daily Having required 80mg  initial dose to get him to 4.0 > daily to 3.9   Physical Exam: Vitals:   02/08/23 1657 02/08/23 1935 02/09/23 0619 02/09/23 0950  BP: 136/70 127/75 113/70   Pulse: (!) 57 (!) 51 (!) 113   Resp: 16 18 20 15   Temp: 98.3 F (36.8 C) 98 F (36.7 C) 98.4 F (36.9 C) 97.8 F (36.6 C)  TempSrc: Oral Oral Oral Oral  SpO2: 99% 99% 98%   Weight:       Height:         GEN- The patient is well appearing, alert and oriented x 3 today.   HEENT: normocephalic, atraumatic; sclera clear, conjunctiva pink; hearing intact; oropharynx clear; neck supple, no JVP Lymph- no cervical lymphadenopathy Lungs- CTA b/l, normal work of breathing.  No wheezes, rales, rhonchi Heart- RRR, no murmurs, rubs or gallops, PMI not laterally displaced GI- soft, non-tender, non-distended Extremities- no clubbing, cyanosis, or edema MS- no significant deformity or atrophy Skin- warm and dry, no rash or lesion Psych- euthymic mood, full affect Neuro- strength and sensation are intact   Labs:   Lab Results  Component Value Date   WBC 6.7 02/09/2023   HGB 14.1 02/09/2023   HCT 42.1 02/09/2023   MCV 93.3 02/09/2023   PLT 143 (L) 02/09/2023    Recent Labs  Lab 02/09/23 0529  NA 138  K 3.9  CL 108  CO2 25  BUN 17  CREATININE 1.16  CALCIUM 9.3  GLUCOSE 97     Discharge Medications:  Allergies as of 02/09/2023   No Known Allergies      Medication List     STOP taking these medications    diltiazem 180 MG 24 hr capsule Commonly known as: CARDIZEM CD       TAKE these medications    acetaminophen 500 MG tablet Commonly known as: TYLENOL Take 500 mg by mouth at bedtime.   Align 4 MG Caps Take  4 mg by mouth every evening.   Allegra Allergy 180 MG tablet Generic drug: fexofenadine Take 1 tablet by mouth daily.   apixaban 5 MG Tabs tablet Commonly known as: ELIQUIS Take 5 mg by mouth 2 (two) times daily.   Asmanex HFA 100 MCG/ACT Aero Generic drug: Mometasone Furoate Inhale 1 puff into the lungs every evening.   atorvastatin 20 MG tablet Commonly known as: LIPITOR Take 20 mg by mouth daily.   Azelastine HCl 137 MCG/SPRAY Soln Place 1 spray into both nostrils 2 (two) times daily.   diltiazem 30 MG tablet Commonly known as: CARDIZEM Take 1 tablet every 4 hours AS NEEDED for afib rapid heart rate over 100   dofetilide  500 MCG capsule Commonly known as: TIKOSYN Take 1 capsule (500 mcg total) by mouth 2 (two) times daily.   EPINEPHrine 0.3 mg/0.3 mL Soaj injection Commonly known as: EPI-PEN Inject 0.3 mg into the muscle as needed for anaphylaxis.   fluticasone 50 MCG/ACT nasal spray Commonly known as: FLONASE Place 1 spray into both nostrils every evening.   GLUCOSAMINE-CHONDROITIN PO Take 1,500 mg by mouth daily.   ipratropium 0.06 % nasal spray Commonly known as: ATROVENT Place 2 sprays into both nostrils at bedtime.   losartan 50 MG tablet Commonly known as: COZAAR Take 25 mg by mouth daily.   montelukast 10 MG tablet Commonly known as: SINGULAIR Take 10 mg by mouth at bedtime.   MULTIVITAMIN ADULT PO Take by mouth. L-Lycine- Taking 1 tablet by mouth daily - 500mg    omeprazole 20 MG capsule Commonly known as: PRILOSEC Take 20 mg by mouth 2 (two) times daily before a meal.   Opcon-A 0.027-0.315 % Soln Generic drug: Naphazoline-Pheniramine Place 1 drop into both eyes as needed (eye allergies (red/irritated eyes)).   potassium chloride SA 20 MEQ tablet Commonly known as: KLOR-CON M Take 1.5 tablets (30 mEq total) by mouth daily. What changed:  medication strength how much to take   psyllium 58.6 % powder Commonly known as: METAMUCIL Taking 2 tsp twice daily   Ventolin HFA 108 (90 Base) MCG/ACT inhaler Generic drug: albuterol Inhale 1-2 puffs into the lungs every 6 (six) hours as needed for wheezing or shortness of breath.   VITAMIN C PO Take by mouth. Taking 1 chewable by mouth daily- 500mg    VITAMIN D PO Take 1,000 Units by mouth daily. Taking 1 gel capsule daily        Disposition:  Discharge Instructions     Diet - low sodium heart healthy   Complete by: As directed    Increase activity slowly   Complete by: As directed         Duration of Discharge Encounter: Greater than 30 minutes including physician time.  Norma Fredrickson,  PA-C 02/09/2023 12:21 PM  I have seen and examined this patient with [name].  Agree with above, note added to reflect my findings.  Patient admitted to the hospital with atrial fibrillation.  Was admitted in sinus rhythm.  Admitted for dofetilide load.  QTc remained stable.  He remained in sinus rhythm throughout the hospitalization.  Plan for discharge today with follow-up in clinic.  GEN: Well nourished, well developed, in no acute distress  HEENT: normal  Neck: no JVD, carotid bruits, or masses Cardiac: RRR; no murmurs, rubs, or gallops,no edema  Respiratory:  clear to auscultation bilaterally, normal work of breathing GI: soft, nontender, nondistended, + BS MS: no deformity or atrophy  Skin: warm and dry Neuro:  Strength and sensation are intact Psych: euthymic mood, full affect    Will M. Camnitz MD 02/14/2023 7:33 AM

## 2023-02-09 NOTE — Progress Notes (Signed)
Pharmacy: Dofetilide (Tikosyn) - Follow Up Assessment and Electrolyte Replacement  Pharmacy consulted to assist in monitoring and replacing electrolytes in this 77 y.o. male admitted on 02/06/2023 undergoing dofetilide initiation. First dofetilide dose: 02/06/23  Labs:    Component Value Date/Time   K 3.9 02/09/2023 0529   MG 2.3 02/09/2023 0529     Plan: Potassium: K 3.8-3.9:  Give KCl 20 mEq po x1   Magnesium: Mg > 2: No additional supplementation needed   As patient has required on average 20 mEq of potassium replacement every day, recommend discharging patient with prescription for:  Potassium chloride 20 mEq  daily  Thank you for allowing pharmacy to participate in this patient's care   Wilmer Floor, PharmD PGY2 Cardiology Pharmacy Resident 02/09/2023  11:09 AM

## 2023-02-12 DIAGNOSIS — C61 Malignant neoplasm of prostate: Secondary | ICD-10-CM | POA: Diagnosis not present

## 2023-02-14 ENCOUNTER — Encounter (HOSPITAL_COMMUNITY): Payer: Self-pay | Admitting: Physician Assistant

## 2023-02-14 ENCOUNTER — Ambulatory Visit (HOSPITAL_COMMUNITY)
Admit: 2023-02-14 | Discharge: 2023-02-14 | Disposition: A | Discharge status: Home / Self Care | Payer: Medicare Other | Source: Ambulatory Visit | Attending: Physician Assistant | Admitting: Physician Assistant

## 2023-02-14 VITALS — BP 124/80 | HR 62 | Ht 70.0 in | Wt 216.4 lb

## 2023-02-14 DIAGNOSIS — Z7901 Long term (current) use of anticoagulants: Secondary | ICD-10-CM | POA: Diagnosis not present

## 2023-02-14 DIAGNOSIS — D6869 Other thrombophilia: Secondary | ICD-10-CM

## 2023-02-14 DIAGNOSIS — Z79899 Other long term (current) drug therapy: Secondary | ICD-10-CM

## 2023-02-14 DIAGNOSIS — I480 Paroxysmal atrial fibrillation: Secondary | ICD-10-CM

## 2023-02-14 DIAGNOSIS — Z923 Personal history of irradiation: Secondary | ICD-10-CM | POA: Insufficient documentation

## 2023-02-14 DIAGNOSIS — Z5181 Encounter for therapeutic drug level monitoring: Secondary | ICD-10-CM | POA: Insufficient documentation

## 2023-02-14 DIAGNOSIS — I700 Atherosclerosis of aorta: Secondary | ICD-10-CM | POA: Diagnosis not present

## 2023-02-14 DIAGNOSIS — I10 Essential (primary) hypertension: Secondary | ICD-10-CM | POA: Diagnosis not present

## 2023-02-14 LAB — BASIC METABOLIC PANEL
Anion gap: 5 (ref 5–15)
BUN: 19 mg/dL (ref 8–23)
CO2: 22 mmol/L (ref 22–32)
Calcium: 8.8 mg/dL — ABNORMAL LOW (ref 8.9–10.3)
Chloride: 108 mmol/L (ref 98–111)
Creatinine, Ser: 1.02 mg/dL (ref 0.61–1.24)
GFR, Estimated: >60 mL/min (ref >60)
Glucose, Bld: 92 mg/dL (ref 70–99)
Potassium: 4 mmol/L (ref 3.5–5.1)
Sodium: 135 mmol/L (ref 135–145)

## 2023-02-14 LAB — MAGNESIUM: Magnesium: 2.1 mg/dL (ref 1.7–2.4)

## 2023-02-14 MED ORDER — DOFETILIDE 500 MCG PO CAPS: 500.0000 ug | ORAL_CAPSULE | Freq: Two times a day (BID) | ORAL | 1 refills | Status: DC

## 2023-02-14 MED ORDER — POTASSIUM CHLORIDE CRYS ER 10 MEQ PO TBCR: 30.0000 meq | EXTENDED_RELEASE_TABLET | Freq: Every day | ORAL | 3 refills | Status: DC

## 2023-02-14 NOTE — Progress Notes (Signed)
Primary Care Physician: Garlan Fillers, MD Primary Cardiologist: Rollene Rotunda, MD Electrophysiologist: Will Jorja Loa, MD  Referring Physician: Dr Hulda Marin Kiel Maclellan. is a 77 y.o. male with a history of aortic atherosclerosis, HTN, prostate cancer, atrial fibrillation who presents for follow up in the [facility]. He is post ablation x 3 on 019 2020, 11/11/2019, 12/14/2021.  He is currently on flecainide 100 mg twice daily. Patient is on Eliquis for a CHADS2VASC score of 4.  On follow up today, patient is s/p dofetilide admission 11/19-11/22/24. He reports that he feels "great". He remains in SR. No bleeding issues on anticoagulation.   Today, he denies symptoms of palpitations, chest pain, shortness of breath, orthopnea, PND, lower extremity edema, presyncope, syncope, snoring, daytime somnolence, bleeding, or neurologic sequela. The patient is tolerating medications without difficulties and is otherwise without complaint today.    Atrial Fibrillation Risk Factors:  he does not have symptoms or diagnosis of sleep apnea. he does not have a history of rheumatic fever.   Atrial Fibrillation Management history:  Previous antiarrhythmic drugs: flecainide, dofetilide  Previous cardioversions: 2 remotely  Previous ablations: 03/28/18, 11/11/19, 12/14/21 Anticoagulation history: Eliquis  ROS- All systems are reviewed and negative except as per the HPI above.  Past Medical History:  Diagnosis Date   Aortic atherosclerosis (HCC)    Arthritis    Asthma    Diverticulitis    Diverticulosis    DVT (deep venous thrombosis) (HCC) 2019   right leg after back surgery   Dysrhythmia    A-fib every few weeks   Ectatic thoracic aorta (HCC)    a. 4.3cm by CT 03/2018.   GERD (gastroesophageal reflux disease)    Hemorrhoids    History of echocardiogram    a. Echo 10/16 [facility]):  EF 55%, trace MR, mild TR, mild to mod AI, mild dilated  Ao root (41 mm)   Hyperlipemia    Organic erectile dysfunction    PAF (paroxysmal atrial fibrillation) (HCC)    a. admx to [facility] in [place], [place] 42/70 with AF with RVR >> converted to NSR with IV Dilt;  b. Flecainide started >> ETT neg for pro-arrhythmia    Pneumonia 06/2020   Prostate cancer (HCC) 04/25/2006   Gleason 3+4=7   Prostatitis    S/P cardiac cath    a. LHC at [facility] 10/16:  LM ok, mLAD 30%, LCx ok, dRCA 20%   S/P radiation therapy 01/19/2014 through 03/05/2014                                                      Prostate bed 6600 cGy in 33 sessions                           Torn rotator cuff    right  40% tear    Current Outpatient Medications  Medication Sig Dispense Refill   acetaminophen (TYLENOL) 500 MG tablet Take 500 mg by mouth at bedtime.     apixaban (ELIQUIS) 5 MG TABS tablet Take 5 mg by mouth 2 (two) times daily.      Ascorbic Acid (VITAMIN C PO) Take by mouth. Taking 1 chewable by mouth daily- 500mg      atorvastatin (LIPITOR)  20 MG tablet Take 20 mg by mouth daily.     Azelastine HCl 137 MCG/SPRAY SOLN Place 1 spray into both nostrils 2 (two) times daily.     diltiazem (CARDIZEM) 30 MG tablet Take 1 tablet every 4 hours AS NEEDED for afib rapid heart rate over 100 60 tablet 1   EPINEPHrine 0.3 mg/0.3 mL IJ SOAJ injection Inject 0.3 mg into the muscle as needed for anaphylaxis.     fexofenadine (ALLEGRA ALLERGY) 180 MG tablet Take 1 tablet by mouth daily.     fluticasone (FLONASE) 50 MCG/ACT nasal spray Place 1 spray into both nostrils every evening.     GLUCOSAMINE-CHONDROITIN PO Take 1,500 mg by mouth daily.     ipratropium (ATROVENT) 0.06 % nasal spray Place 2 sprays into both nostrils at bedtime.     losartan (COZAAR) 50 MG tablet Take 25 mg by mouth daily.     Mometasone Furoate (ASMANEX HFA) 100 MCG/ACT AERO Inhale 1 puff into the lungs every evening.     montelukast (SINGULAIR) 10 MG tablet Take 10 mg by mouth at bedtime.     Multiple  Vitamin (MULTIVITAMIN ADULT PO) Take by mouth. L-Lycine- Taking 1 tablet by mouth daily - 500mg      Naphazoline-Pheniramine (OPCON-A) 0.027-0.315 % SOLN Place 1 drop into both eyes as needed (eye allergies (red/irritated eyes)).     omeprazole (PRILOSEC) 20 MG capsule Take 20 mg by mouth 2 (two) times daily before a meal.      Probiotic Product (ALIGN) 4 MG CAPS Take 4 mg by mouth every evening.     psyllium (METAMUCIL) 58.6 % powder Taking 2 tsp twice daily     VENTOLIN HFA 108 (90 Base) MCG/ACT inhaler Inhale 1-2 puffs into the lungs every 6 (six) hours as needed for wheezing or shortness of breath.     VITAMIN D PO Take 1,000 Units by mouth daily. Taking 1 gel capsule daily     dofetilide (TIKOSYN) 500 MCG capsule Take 1 capsule (500 mcg total) by mouth 2 (two) times daily. 180 capsule 1   potassium chloride SA (KLOR-CON M) 10 MEQ tablet Take 3 tablets (30 mEq total) by mouth daily. 270 tablet 3   No current facility-administered medications for this encounter.    Physical Exam: BP 124/80   Pulse 62   Ht 5\' 10"  (1.778 m)   Wt 98.2 kg   BMI 31.05 kg/m   GEN: Well nourished, well developed in no acute distress NECK: No JVD; No carotid bruits CARDIAC: Regular rate and rhythm, no murmurs, rubs, gallops RESPIRATORY:  Clear to auscultation without rales, wheezing or rhonchi  ABDOMEN: Soft, non-tender, non-distended EXTREMITIES:  No edema; No deformity    Wt Readings from Last 3 Encounters:  02/14/23 98.2 kg  02/06/23 97.5 kg  02/06/23 99.1 kg     EKG today demonstrates  SR, PAC Vent. rate 62 BPM PR interval 190 ms QRS duration 92 ms QT/QTcB 402/408 ms  Echo 08/24/18 demonstrated   1. The left ventricle has normal systolic function with an ejection  fraction of 60-65%. The cavity size was normal. Left ventricular diastolic  Doppler parameters are consistent with pseudonormalization.   2. The right ventricle has normal systolic function. The cavity was  normal. There is no  increase in right ventricular wall thickness.   3. No evidence of mitral valve stenosis.   4. Aortic valve regurgitation is mild by color flow Doppler. No stenosis  of the aortic valve.   5. There  is mild dilatation of the aortic root and of the ascending  aorta.   6. The interatrial septum was not assessed.    CHA2DS2-VASc Score = 4  The patient's score is based upon: CHF History: 0 HTN History: 1 Diabetes History: 0 Stroke History: 0 Vascular Disease History: 1 (aortic atherosclerosis) Age Score: 2 Gender Score: 0       ASSESSMENT AND PLAN: Paroxysmal Atrial Fibrillation (ICD10:  I48.0) The patient's CHA2DS2-VASc score is 4, indicating a 4.8% annual risk of stroke.   S/p ablation 03/2018, 11/11/19, 12/14/21 Previously failed flecainide  S/p dofetilide admission 11/19-11/22/24 Patient appears to be maintaining SR Continue dofetilide 500 mcg BID, QT stable Check bmet/mag today Continue Eliquis 5 mg BID Scheduled diltiazem discontinued due to bradycardia. Continue diltiazem 30 mg PRN q 4 hours for heart racing  Secondary Hypercoagulable State (ICD10:  D68.69) The patient is at significant risk for stroke/thromboembolism based upon his CHA2DS2-VASc Score of 4.  Continue Apixaban (Eliquis).   HTN Stable on current regimen   Follow up in the AF clinic in one month.       Jorja Loa PA-C Afib Clinic [facility] [address] [place], [place] [address] [phone]

## 2023-02-21 DIAGNOSIS — C61 Malignant neoplasm of prostate: Secondary | ICD-10-CM | POA: Diagnosis not present

## 2023-02-21 DIAGNOSIS — R310 Gross hematuria: Secondary | ICD-10-CM | POA: Diagnosis not present

## 2023-03-02 DIAGNOSIS — J3089 Other allergic rhinitis: Secondary | ICD-10-CM | POA: Diagnosis not present

## 2023-03-02 DIAGNOSIS — J301 Allergic rhinitis due to pollen: Secondary | ICD-10-CM | POA: Diagnosis not present

## 2023-03-06 DIAGNOSIS — H26493 Other secondary cataract, bilateral: Secondary | ICD-10-CM | POA: Diagnosis not present

## 2023-03-06 DIAGNOSIS — H5213 Myopia, bilateral: Secondary | ICD-10-CM | POA: Diagnosis not present

## 2023-03-06 DIAGNOSIS — H04123 Dry eye syndrome of bilateral lacrimal glands: Secondary | ICD-10-CM | POA: Diagnosis not present

## 2023-03-06 DIAGNOSIS — H52203 Unspecified astigmatism, bilateral: Secondary | ICD-10-CM | POA: Diagnosis not present

## 2023-03-06 DIAGNOSIS — H35361 Drusen (degenerative) of macula, right eye: Secondary | ICD-10-CM | POA: Diagnosis not present

## 2023-03-06 DIAGNOSIS — H524 Presbyopia: Secondary | ICD-10-CM | POA: Diagnosis not present

## 2023-03-06 DIAGNOSIS — H43813 Vitreous degeneration, bilateral: Secondary | ICD-10-CM | POA: Diagnosis not present

## 2023-03-07 DIAGNOSIS — J301 Allergic rhinitis due to pollen: Secondary | ICD-10-CM | POA: Diagnosis not present

## 2023-03-07 DIAGNOSIS — J3089 Other allergic rhinitis: Secondary | ICD-10-CM | POA: Diagnosis not present

## 2023-03-09 ENCOUNTER — Ambulatory Visit: Payer: BLUE CROSS/BLUE SHIELD | Admitting: Physician Assistant

## 2023-03-28 ENCOUNTER — Ambulatory Visit (HOSPITAL_COMMUNITY)
Admission: RE | Admit: 2023-03-28 | Discharge: 2023-03-28 | Disposition: A | Discharge status: Home / Self Care | Payer: Medicare Other | Source: Ambulatory Visit | Attending: Physician Assistant | Admitting: Physician Assistant

## 2023-03-28 ENCOUNTER — Encounter (HOSPITAL_COMMUNITY): Payer: Self-pay | Admitting: Physician Assistant

## 2023-03-28 VITALS — BP 134/82 | HR 76 | Ht 70.0 in | Wt 212.8 lb

## 2023-03-28 DIAGNOSIS — C61 Malignant neoplasm of prostate: Secondary | ICD-10-CM | POA: Diagnosis not present

## 2023-03-28 DIAGNOSIS — Z5181 Encounter for therapeutic drug level monitoring: Secondary | ICD-10-CM | POA: Diagnosis not present

## 2023-03-28 DIAGNOSIS — R310 Gross hematuria: Secondary | ICD-10-CM | POA: Diagnosis not present

## 2023-03-28 DIAGNOSIS — Z7901 Long term (current) use of anticoagulants: Secondary | ICD-10-CM | POA: Diagnosis not present

## 2023-03-28 DIAGNOSIS — Z79899 Other long term (current) drug therapy: Secondary | ICD-10-CM | POA: Diagnosis not present

## 2023-03-28 DIAGNOSIS — R001 Bradycardia, unspecified: Secondary | ICD-10-CM | POA: Insufficient documentation

## 2023-03-28 DIAGNOSIS — K5730 Diverticulosis of large intestine without perforation or abscess without bleeding: Secondary | ICD-10-CM | POA: Diagnosis not present

## 2023-03-28 DIAGNOSIS — D6869 Other thrombophilia: Secondary | ICD-10-CM | POA: Diagnosis not present

## 2023-03-28 DIAGNOSIS — Z9889 Other specified postprocedural states: Secondary | ICD-10-CM | POA: Insufficient documentation

## 2023-03-28 DIAGNOSIS — I10 Essential (primary) hypertension: Secondary | ICD-10-CM | POA: Insufficient documentation

## 2023-03-28 DIAGNOSIS — I480 Paroxysmal atrial fibrillation: Secondary | ICD-10-CM | POA: Diagnosis not present

## 2023-03-28 LAB — BASIC METABOLIC PANEL
Anion gap: 6 (ref 5–15)
BUN: 14 mg/dL (ref 8–23)
CO2: 24 mmol/L (ref 22–32)
Calcium: 9 mg/dL (ref 8.9–10.3)
Chloride: 108 mmol/L (ref 98–111)
Creatinine, Ser: 1.02 mg/dL (ref 0.61–1.24)
GFR, Estimated: >60 mL/min (ref >60)
Glucose, Bld: 88 mg/dL (ref 70–99)
Potassium: 4.3 mmol/L (ref 3.5–5.1)
Sodium: 138 mmol/L (ref 135–145)

## 2023-03-28 LAB — MAGNESIUM: Magnesium: 2.1 mg/dL (ref 1.7–2.4)

## 2023-03-28 NOTE — Progress Notes (Signed)
 Primary Care Physician: Yolande Toribio MATSU, MD Primary Cardiologist: Lynwood Schilling, MD Electrophysiologist: Will Gladis Norton, MD  Referring Physician: Dr Kelsie Leos Ousmane Seeman. is a 78 y.o. male with a history of aortic atherosclerosis, HTN, prostate cancer, atrial fibrillation who presents for follow up in the [facility]ibr[facility]. He is post ablation x 3 on 019 2020, 11/11/2019, 12/14/2021.  He is currently on flecainide  100 mg twice daily. Patient is on Eliquis  for a CHADS2VASC score of 4.  Patient is s/p dofetilide  admission 11/19-11/22/24.   On follow up today, patient reports that he has done well since his last visit. He did have two episodes of tachypalpitations on 12/2 and 12/30. Both resolved within 2-3 hours. Overall, he states he has a new lease on life. He feels much more energetic. No bleeding issues on anticoagulation.   Today, he denies symptoms of palpitations, chest pain, shortness of breath, orthopnea, PND, lower extremity edema, presyncope, syncope, snoring, daytime somnolence, bleeding, or neurologic sequela. The patient is tolerating medications without difficulties and is otherwise without complaint today.    Atrial Fibrillation Risk Factors:  he does not have symptoms or diagnosis of sleep apnea. he does not have a history of rheumatic fever.   Atrial Fibrillation Management history:  Previous antiarrhythmic drugs: flecainide , dofetilide   Previous cardioversions: 2 remotely  Previous ablations: 03/28/18, 11/11/19, 12/14/21 Anticoagulation history: Eliquis   ROS- All systems are reviewed and negative except as per the HPI above.  Past Medical History:  Diagnosis Date   Aortic atherosclerosis (HCC)    Arthritis    Asthma    Diverticulitis    Diverticulosis    DVT (deep venous thrombosis) (HCC) 2019   right leg after back surgery   Dysrhythmia    A-fib every few weeks   Ectatic thoracic aorta (HCC)    a. 4.3cm by CT  03/2018.   GERD (gastroesophageal reflux disease)    Hemorrhoids    History of echocardiogram    a. Echo 10/16 [facility]):  EF 55%, trace MR, mild TR, mild to mod AI, mild dilated Ao root (41 mm)   Hyperlipemia    Organic erectile dysfunction    PAF (paroxysmal atrial fibrillation) (HCC)    a. admx to [facility] in [place], [place] 89/83 with AF with RVR >> converted to NSR with IV Dilt;  b. Flecainide  started >> ETT neg for pro-arrhythmia    Pneumonia 06/2020   Prostate cancer (HCC) 04/25/2006   Gleason 3+4=7   Prostatitis    S/P cardiac cath    a. LHC at [facility] 10/16:  LM ok, mLAD 30%, LCx ok, dRCA 20%   S/P radiation therapy 01/19/2014 through 03/05/2014                                                      Prostate bed 6600 cGy in 33 sessions                           Torn rotator cuff    right  40% tear    Current Outpatient Medications  Medication Sig Dispense Refill   acetaminophen  (TYLENOL ) 500 MG tablet Take 500 mg by mouth at bedtime.     apixaban  (ELIQUIS ) 5 MG TABS tablet Take  5 mg by mouth 2 (two) times daily.      Ascorbic Acid  (VITAMIN C  PO) Take by mouth. Taking 1 chewable by mouth daily- 500mg      atorvastatin  (LIPITOR) 20 MG tablet Take 20 mg by mouth daily.     Azelastine  HCl 137 MCG/SPRAY SOLN Place 1 spray into both nostrils 2 (two) times daily.     diltiazem  (CARDIZEM ) 30 MG tablet Take 1 tablet every 4 hours AS NEEDED for afib rapid heart rate over 100 60 tablet 1   dofetilide  (TIKOSYN ) 500 MCG capsule Take 1 capsule (500 mcg total) by mouth 2 (two) times daily. 180 capsule 1   EPINEPHrine  0.3 mg/0.3 mL IJ SOAJ injection Inject 0.3 mg into the muscle as needed for anaphylaxis.     fexofenadine (ALLEGRA ALLERGY) 180 MG tablet Take 1 tablet by mouth daily.     fluticasone  (FLONASE ) 50 MCG/ACT nasal spray Place 1 spray into both nostrils every evening.     GLUCOSAMINE-CHONDROITIN PO Take 1,500 mg by mouth daily.     ipratropium (ATROVENT ) 0.06 %  nasal spray Place 2 sprays into both nostrils at bedtime.     losartan  (COZAAR ) 50 MG tablet Take 25 mg by mouth daily.     Mometasone  Furoate (ASMANEX  HFA) 100 MCG/ACT AERO Inhale 1 puff into the lungs every evening.     montelukast  (SINGULAIR ) 10 MG tablet Take 10 mg by mouth at bedtime.     Multiple Vitamin (MULTIVITAMIN ADULT PO) Take by mouth. L-Lycine- Taking 1 tablet by mouth daily - 500mg      Naphazoline-Pheniramine (OPCON-A ) 0.027-0.315 % SOLN Place 1 drop into both eyes as needed (eye allergies (red/irritated eyes)).     omeprazole (PRILOSEC) 20 MG capsule Take 20 mg by mouth 2 (two) times daily before a meal.      potassium chloride  SA (KLOR-CON  M) 10 MEQ tablet Take 3 tablets (30 mEq total) by mouth daily. 270 tablet 3   Probiotic Product (ALIGN) 4 MG CAPS Take 4 mg by mouth every evening.     psyllium (METAMUCIL) 58.6 % powder Taking 2 tsp twice daily     VENTOLIN  HFA 108 (90 Base) MCG/ACT inhaler Inhale 1-2 puffs into the lungs every 6 (six) hours as needed for wheezing or shortness of breath.     VITAMIN D  PO Take 1,000 Units by mouth daily. Taking 1 gel capsule daily     No current facility-administered medications for this encounter.    Physical Exam: BP 134/82   Pulse 76   Ht 5' 10 (1.778 m)   Wt 96.5 kg   BMI 30.53 kg/m   GEN: Well nourished, well developed in no acute distress NECK: No JVD; No carotid bruits CARDIAC: Regular rate and rhythm, no murmurs, rubs, gallops RESPIRATORY:  Clear to auscultation without rales, wheezing or rhonchi  ABDOMEN: Soft, non-tender, non-distended EXTREMITIES:  No edema; No deformity    Wt Readings from Last 3 Encounters:  03/28/23 96.5 kg  02/14/23 98.2 kg  02/06/23 97.5 kg     EKG today demonstrates  SR Vent. rate 76 BPM PR interval 200 ms QRS duration 86 ms QT/QTcB 386/434 ms  Echo 08/24/18 demonstrated   1. The left ventricle has normal systolic function with an ejection  fraction of 60-65%. The cavity size was  normal. Left ventricular diastolic  Doppler parameters are consistent with pseudonormalization.   2. The right ventricle has normal systolic function. The cavity was  normal. There is no increase in right ventricular wall  thickness.   3. No evidence of mitral valve stenosis.   4. Aortic valve regurgitation is mild by color flow Doppler. No stenosis  of the aortic valve.   5. There is mild dilatation of the aortic root and of the ascending  aorta.   6. The interatrial septum was not assessed.    CHA2DS2-VASc Score = 4  The patient's score is based upon: CHF History: 0 HTN History: 1 Diabetes History: 0 Stroke History: 0 Vascular Disease History: 1 (aortic atherosclerosis) Age Score: 2 Gender Score: 0       ASSESSMENT AND PLAN: Paroxysmal Atrial Fibrillation (ICD10:  I48.0) The patient's CHA2DS2-VASc score is 4, indicating a 4.8% annual risk of stroke.   S/p ablation 03/2018, 11/11/19, 12/14/21 Previously failed flecainide   S/p dofetilide  admission 11/19-11/22/24 Patient appears to be maintaining SR with few breakthrough episodes.  Continue dofetilide  500 mcg BID, QT stable Check bmet/mag today Continue Eliquis  5 mg BID Scheduled diltiazem  discontinued due to bradycardia. Continue diltiazem  30 mg PRN q 4 hours for heart racing  Secondary Hypercoagulable State (ICD10:  D68.69) The patient is at significant risk for stroke/thromboembolism based upon his CHA2DS2-VASc Score of 4.  Continue Apixaban  (Eliquis ).   HTN Stable on current regimen   Follow up in the AF clinic in 3 months.       Daril Kicks PA-C Afib Clinic [facility] [address] [place], [place] [address] [phone]

## 2023-03-30 DIAGNOSIS — J3089 Other allergic rhinitis: Secondary | ICD-10-CM | POA: Diagnosis not present

## 2023-03-30 DIAGNOSIS — J301 Allergic rhinitis due to pollen: Secondary | ICD-10-CM | POA: Diagnosis not present

## 2023-04-02 DIAGNOSIS — J301 Allergic rhinitis due to pollen: Secondary | ICD-10-CM | POA: Diagnosis not present

## 2023-04-02 DIAGNOSIS — B999 Unspecified infectious disease: Secondary | ICD-10-CM | POA: Diagnosis not present

## 2023-04-02 DIAGNOSIS — J3089 Other allergic rhinitis: Secondary | ICD-10-CM | POA: Diagnosis not present

## 2023-04-02 DIAGNOSIS — J4530 Mild persistent asthma, uncomplicated: Secondary | ICD-10-CM | POA: Diagnosis not present

## 2023-04-06 DIAGNOSIS — J301 Allergic rhinitis due to pollen: Secondary | ICD-10-CM | POA: Diagnosis not present

## 2023-04-06 DIAGNOSIS — J3089 Other allergic rhinitis: Secondary | ICD-10-CM | POA: Diagnosis not present

## 2023-04-06 DIAGNOSIS — J3081 Allergic rhinitis due to animal (cat) (dog) hair and dander: Secondary | ICD-10-CM | POA: Diagnosis not present

## 2023-04-10 DIAGNOSIS — J3081 Allergic rhinitis due to animal (cat) (dog) hair and dander: Secondary | ICD-10-CM | POA: Diagnosis not present

## 2023-04-10 DIAGNOSIS — J3089 Other allergic rhinitis: Secondary | ICD-10-CM | POA: Diagnosis not present

## 2023-04-10 DIAGNOSIS — J301 Allergic rhinitis due to pollen: Secondary | ICD-10-CM | POA: Diagnosis not present

## 2023-04-16 DIAGNOSIS — J3089 Other allergic rhinitis: Secondary | ICD-10-CM | POA: Diagnosis not present

## 2023-04-16 DIAGNOSIS — J3081 Allergic rhinitis due to animal (cat) (dog) hair and dander: Secondary | ICD-10-CM | POA: Diagnosis not present

## 2023-04-16 DIAGNOSIS — J301 Allergic rhinitis due to pollen: Secondary | ICD-10-CM | POA: Diagnosis not present

## 2023-04-29 ENCOUNTER — Other Ambulatory Visit (HOSPITAL_COMMUNITY): Payer: Self-pay | Admitting: Physician Assistant

## 2023-05-15 DIAGNOSIS — J3089 Other allergic rhinitis: Secondary | ICD-10-CM | POA: Diagnosis not present

## 2023-05-15 DIAGNOSIS — J3081 Allergic rhinitis due to animal (cat) (dog) hair and dander: Secondary | ICD-10-CM | POA: Diagnosis not present

## 2023-05-15 DIAGNOSIS — J301 Allergic rhinitis due to pollen: Secondary | ICD-10-CM | POA: Diagnosis not present

## 2023-05-22 DIAGNOSIS — C61 Malignant neoplasm of prostate: Secondary | ICD-10-CM | POA: Diagnosis not present

## 2023-06-11 DIAGNOSIS — J3081 Allergic rhinitis due to animal (cat) (dog) hair and dander: Secondary | ICD-10-CM | POA: Diagnosis not present

## 2023-06-11 DIAGNOSIS — J301 Allergic rhinitis due to pollen: Secondary | ICD-10-CM | POA: Diagnosis not present

## 2023-06-11 DIAGNOSIS — J3089 Other allergic rhinitis: Secondary | ICD-10-CM | POA: Diagnosis not present

## 2023-06-27 ENCOUNTER — Ambulatory Visit (HOSPITAL_COMMUNITY)
Admission: RE | Admit: 2023-06-27 | Discharge: 2023-06-27 | Disposition: A | Discharge status: Home / Self Care | Payer: BLUE CROSS/BLUE SHIELD | Source: Ambulatory Visit | Attending: Physician Assistant | Admitting: Physician Assistant

## 2023-06-27 ENCOUNTER — Encounter (HOSPITAL_COMMUNITY): Payer: Self-pay | Admitting: Physician Assistant

## 2023-06-27 VITALS — BP 128/84 | HR 67 | Ht 70.0 in | Wt 210.4 lb

## 2023-06-27 DIAGNOSIS — Z79899 Other long term (current) drug therapy: Secondary | ICD-10-CM | POA: Diagnosis not present

## 2023-06-27 DIAGNOSIS — Z8546 Personal history of malignant neoplasm of prostate: Secondary | ICD-10-CM | POA: Diagnosis not present

## 2023-06-27 DIAGNOSIS — D6869 Other thrombophilia: Secondary | ICD-10-CM | POA: Diagnosis not present

## 2023-06-27 DIAGNOSIS — Z7901 Long term (current) use of anticoagulants: Secondary | ICD-10-CM | POA: Diagnosis not present

## 2023-06-27 DIAGNOSIS — I480 Paroxysmal atrial fibrillation: Secondary | ICD-10-CM | POA: Insufficient documentation

## 2023-06-27 DIAGNOSIS — Z5181 Encounter for therapeutic drug level monitoring: Secondary | ICD-10-CM | POA: Diagnosis not present

## 2023-06-27 DIAGNOSIS — I10 Essential (primary) hypertension: Secondary | ICD-10-CM | POA: Insufficient documentation

## 2023-06-27 DIAGNOSIS — I700 Atherosclerosis of aorta: Secondary | ICD-10-CM | POA: Diagnosis not present

## 2023-06-27 LAB — BASIC METABOLIC PANEL WITH GFR
Anion gap: 8 (ref 5–15)
BUN: 16 mg/dL (ref 8–23)
CO2: 23 mmol/L (ref 22–32)
Calcium: 9.2 mg/dL (ref 8.9–10.3)
Chloride: 105 mmol/L (ref 98–111)
Creatinine, Ser: 1 mg/dL (ref 0.61–1.24)
GFR, Estimated: >60 mL/min (ref >60)
Glucose, Bld: 95 mg/dL (ref 70–99)
Potassium: 4.4 mmol/L (ref 3.5–5.1)
Sodium: 136 mmol/L (ref 135–145)

## 2023-06-27 LAB — MAGNESIUM: Magnesium: 2.2 mg/dL (ref 1.7–2.4)

## 2023-06-27 NOTE — Progress Notes (Signed)
 Primary Care Physician: Garlan Fillers, MD Primary Cardiologist: Rollene Rotunda, MD Electrophysiologist: Will Jorja Loa, MD  Referring Physician: Dr Hulda Marin Richard Ritchey. is a 78 y.o. male with a history of aortic atherosclerosis, HTN, prostate cancer, atrial fibrillation who presents for follow up in the [facility]. He is post ablation x 3 on 11/2018, 11/11/2019, 12/14/2021.  Previously on flecainide for rhythm control. Patient is on Eliquis for stroke prevention. He had more afib and flecainide was discontinued. Patient is s/p dofetilide admission 11/19-11/22/24.   Patient returns for follow up for atrial fibrillation and dofetilide monitoring. He has had 4-5 episodes since starting on dofetilide. The episodes last 1-3 hours. There does not appear to be any specific trigger. No bleeding issues on anticoagulation.   Today, he  denies symptoms of palpitations, chest pain, shortness of breath, orthopnea, PND, lower extremity edema, dizziness, presyncope, syncope, snoring, daytime somnolence, bleeding, or neurologic sequela. The patient is tolerating medications without difficulties and is otherwise without complaint today.    Atrial Fibrillation Risk Factors:  he does not have symptoms or diagnosis of sleep apnea. he does not have a history of rheumatic fever.   Atrial Fibrillation Management history:  Previous antiarrhythmic drugs: flecainide, dofetilide  Previous cardioversions: 2 remotely  Previous ablations: 03/28/18, 11/11/19, 12/14/21 Anticoagulation history: Eliquis  ROS- All systems are reviewed and negative except as per the HPI above.  Past Medical History:  Diagnosis Date   Aortic atherosclerosis (HCC)    Arthritis    Asthma    Diverticulitis    Diverticulosis    DVT (deep venous thrombosis) (HCC) 2019   right leg after back surgery   Dysrhythmia    A-fib every few weeks   Ectatic thoracic aorta (HCC)    a. 4.3cm by CT  03/2018.   GERD (gastroesophageal reflux disease)    Hemorrhoids    History of echocardiogram    a. Echo 10/16 [facility]):  EF 55%, trace MR, mild TR, mild to mod AI, mild dilated Ao root (41 mm)   Hyperlipemia    Organic erectile dysfunction    PAF (paroxysmal atrial fibrillation) (HCC)    a. admx to [facility] in [place], [place] 16/10 with AF with RVR >> converted to NSR with IV Dilt;  b. Flecainide started >> ETT neg for pro-arrhythmia    Pneumonia 06/2020   Prostate cancer (HCC) 04/25/2006   Gleason 3+4=7   Prostatitis    S/P cardiac cath    a. LHC at [facility] 10/16:  LM ok, mLAD 30%, LCx ok, dRCA 20%   S/P radiation therapy 01/19/2014 through 03/05/2014                                                      Prostate bed 6600 cGy in 33 sessions                           Torn rotator cuff    right  40% tear    Current Outpatient Medications  Medication Sig Dispense Refill   acetaminophen (TYLENOL) 500 MG tablet Take 500 mg by mouth at bedtime.     apixaban (ELIQUIS) 5 MG TABS tablet Take 5 mg by mouth 2 (two) times daily.  Ascorbic Acid (VITAMIN C PO) Take by mouth. Taking 1 chewable by mouth daily- 500mg      atorvastatin (LIPITOR) 20 MG tablet Take 20 mg by mouth daily.     Azelastine HCl 137 MCG/SPRAY SOLN Place 1 spray into both nostrils 2 (two) times daily.     diltiazem (CARDIZEM) 30 MG tablet Take 1 tablet every 4 hours AS NEEDED for afib rapid heart rate over 100 60 tablet 1   dofetilide (TIKOSYN) 500 MCG capsule Take 1 capsule (500 mcg total) by mouth 2 (two) times daily. 180 capsule 1   EPINEPHrine 0.3 mg/0.3 mL IJ SOAJ injection Inject 0.3 mg into the muscle as needed for anaphylaxis.     fexofenadine (ALLEGRA ALLERGY) 180 MG tablet Take 1 tablet by mouth daily.     fluticasone (FLONASE) 50 MCG/ACT nasal spray Place 1 spray into both nostrils every evening.     GLUCOSAMINE-CHONDROITIN PO Take 1,500 mg by mouth daily.     ipratropium (ATROVENT) 0.06 %  nasal spray Place 2 sprays into both nostrils at bedtime.     losartan (COZAAR) 50 MG tablet Take 25 mg by mouth daily.     Mometasone Furoate (ASMANEX HFA) 100 MCG/ACT AERO Inhale 1 puff into the lungs every evening.     montelukast (SINGULAIR) 10 MG tablet Take 10 mg by mouth at bedtime.     Multiple Vitamin (MULTIVITAMIN ADULT PO) Take by mouth. L-Lycine- Taking 1 tablet by mouth daily - 500mg      Naphazoline-Pheniramine (OPCON-A) 0.027-0.315 % SOLN Place 1 drop into both eyes as needed (eye allergies (red/irritated eyes)).     omeprazole (PRILOSEC) 20 MG capsule Take 20 mg by mouth 2 (two) times daily before a meal.      potassium chloride (KLOR-CON M10) 10 MEQ tablet Take 3 tablets by mouth daily 270 tablet 1   Probiotic Product (ALIGN) 4 MG CAPS Take 4 mg by mouth every evening.     psyllium (METAMUCIL) 58.6 % powder Taking 2 tsp twice daily     VENTOLIN HFA 108 (90 Base) MCG/ACT inhaler Inhale 1-2 puffs into the lungs every 6 (six) hours as needed for wheezing or shortness of breath.     VITAMIN D PO Take 1,000 Units by mouth daily. Taking 1 gel capsule daily     No current facility-administered medications for this visit.    Physical Exam: There were no vitals taken for this visit.  GEN: Well nourished, well developed in no acute distress CARDIAC: Regular rate and rhythm, no murmurs, rubs, gallops RESPIRATORY:  Clear to auscultation without rales, wheezing or rhonchi  ABDOMEN: Soft, non-tender, non-distended EXTREMITIES:  No edema; No deformity    Wt Readings from Last 3 Encounters:  03/28/23 96.5 kg  02/14/23 98.2 kg  02/06/23 97.5 kg     EKG today demonstrates  SR Vent. rate 67 BPM PR interval 194 ms QRS duration 86 ms QT/QTcB 402/424 ms   Echo 08/24/18 demonstrated   1. The left ventricle has normal systolic function with an ejection  fraction of 60-65%. The cavity size was normal. Left ventricular diastolic  Doppler parameters are consistent with  pseudonormalization.   2. The right ventricle has normal systolic function. The cavity was  normal. There is no increase in right ventricular wall thickness.   3. No evidence of mitral valve stenosis.   4. Aortic valve regurgitation is mild by color flow Doppler. No stenosis  of the aortic valve.   5. There is mild dilatation of the  aortic root and of the ascending  aorta.   6. The interatrial septum was not assessed.    CHA2DS2-VASc Score = 4  The patient's score is based upon: CHF History: 0 HTN History: 1 Diabetes History: 0 Stroke History: 0 Vascular Disease History: 1 (aortic atherosclerosis) Age Score: 2 Gender Score: 0       ASSESSMENT AND PLAN: Paroxysmal Atrial Fibrillation (ICD10:  I48.0) The patient's CHA2DS2-VASc score is 4, indicating a 4.8% annual risk of stroke.   S/p ablation 03/2018, 11/11/19, 12/14/21 Previously failed flecainide. S/p dofetilide admission 01/2023 Patient maintaining SR the majority of the time with occasional breakthrough episodes. He is happy with his present treatment. If further rhythm control is needed, could consider amiodarone, PFA, or surgical ablation.  Continue dofetilide 500 mcg BID Continue Eliquis 5 mg BID Scheduled diltiazem discontinued due to bradycardia Continue diltiazem 30 mg PRN q 4 hours for heart racing  Secondary Hypercoagulable State (ICD10:  D68.69) The patient is at significant risk for stroke/thromboembolism based upon his CHA2DS2-VASc Score of 4.  Continue Apixaban (Eliquis). No bleeding issues.   High Risk Medication Monitoring (ICD 10: Z79.899) QT interval on ECG acceptable for dofetilide monitoring. Check bmet/mag today.      HTN Stable on current regimen   Follow up in the AF clinic in 3 months.       Jorja Loa PA-C [facility] [address] [place], [place] [address] [phone]

## 2023-07-10 DIAGNOSIS — J45909 Unspecified asthma, uncomplicated: Secondary | ICD-10-CM | POA: Diagnosis not present

## 2023-07-10 DIAGNOSIS — I4891 Unspecified atrial fibrillation: Secondary | ICD-10-CM | POA: Diagnosis not present

## 2023-07-10 DIAGNOSIS — R058 Other specified cough: Secondary | ICD-10-CM | POA: Diagnosis not present

## 2023-07-10 DIAGNOSIS — J029 Acute pharyngitis, unspecified: Secondary | ICD-10-CM | POA: Diagnosis not present

## 2023-07-10 DIAGNOSIS — Z7901 Long term (current) use of anticoagulants: Secondary | ICD-10-CM | POA: Diagnosis not present

## 2023-07-10 DIAGNOSIS — R0981 Nasal congestion: Secondary | ICD-10-CM | POA: Diagnosis not present

## 2023-07-10 DIAGNOSIS — Z1152 Encounter for screening for COVID-19: Secondary | ICD-10-CM | POA: Diagnosis not present

## 2023-07-10 DIAGNOSIS — R509 Fever, unspecified: Secondary | ICD-10-CM | POA: Diagnosis not present

## 2023-07-11 ENCOUNTER — Encounter (HOSPITAL_BASED_OUTPATIENT_CLINIC_OR_DEPARTMENT_OTHER): Payer: Self-pay | Admitting: Emergency Medicine

## 2023-07-11 ENCOUNTER — Inpatient Hospital Stay (HOSPITAL_BASED_OUTPATIENT_CLINIC_OR_DEPARTMENT_OTHER)
Admission: EM | Admit: 2023-07-11 | Discharge: 2023-07-17 | DRG: 193 | Disposition: A | Discharge status: Home / Self Care | Attending: Family Medicine | Admitting: Family Medicine

## 2023-07-11 ENCOUNTER — Emergency Department (HOSPITAL_BASED_OUTPATIENT_CLINIC_OR_DEPARTMENT_OTHER)

## 2023-07-11 DIAGNOSIS — I10 Essential (primary) hypertension: Secondary | ICD-10-CM | POA: Diagnosis not present

## 2023-07-11 DIAGNOSIS — R531 Weakness: Secondary | ICD-10-CM | POA: Diagnosis not present

## 2023-07-11 DIAGNOSIS — Z800 Family history of malignant neoplasm of digestive organs: Secondary | ICD-10-CM | POA: Diagnosis not present

## 2023-07-11 DIAGNOSIS — J45909 Unspecified asthma, uncomplicated: Secondary | ICD-10-CM | POA: Diagnosis not present

## 2023-07-11 DIAGNOSIS — Z1152 Encounter for screening for COVID-19: Secondary | ICD-10-CM | POA: Diagnosis not present

## 2023-07-11 DIAGNOSIS — E871 Hypo-osmolality and hyponatremia: Secondary | ICD-10-CM | POA: Diagnosis present

## 2023-07-11 DIAGNOSIS — R0989 Other specified symptoms and signs involving the circulatory and respiratory systems: Secondary | ICD-10-CM | POA: Diagnosis present

## 2023-07-11 DIAGNOSIS — Z8261 Family history of arthritis: Secondary | ICD-10-CM

## 2023-07-11 DIAGNOSIS — K219 Gastro-esophageal reflux disease without esophagitis: Secondary | ICD-10-CM | POA: Diagnosis present

## 2023-07-11 DIAGNOSIS — Z8249 Family history of ischemic heart disease and other diseases of the circulatory system: Secondary | ICD-10-CM

## 2023-07-11 DIAGNOSIS — Z801 Family history of malignant neoplasm of trachea, bronchus and lung: Secondary | ICD-10-CM

## 2023-07-11 DIAGNOSIS — J189 Pneumonia, unspecified organism: Principal | ICD-10-CM | POA: Diagnosis present

## 2023-07-11 DIAGNOSIS — Z825 Family history of asthma and other chronic lower respiratory diseases: Secondary | ICD-10-CM

## 2023-07-11 DIAGNOSIS — A419 Sepsis, unspecified organism: Secondary | ICD-10-CM | POA: Diagnosis not present

## 2023-07-11 DIAGNOSIS — D696 Thrombocytopenia, unspecified: Secondary | ICD-10-CM | POA: Diagnosis present

## 2023-07-11 DIAGNOSIS — Z7901 Long term (current) use of anticoagulants: Secondary | ICD-10-CM | POA: Diagnosis not present

## 2023-07-11 DIAGNOSIS — D649 Anemia, unspecified: Secondary | ICD-10-CM | POA: Diagnosis not present

## 2023-07-11 DIAGNOSIS — I5032 Chronic diastolic (congestive) heart failure: Secondary | ICD-10-CM | POA: Diagnosis not present

## 2023-07-11 DIAGNOSIS — Z833 Family history of diabetes mellitus: Secondary | ICD-10-CM

## 2023-07-11 DIAGNOSIS — R410 Disorientation, unspecified: Principal | ICD-10-CM

## 2023-07-11 DIAGNOSIS — E876 Hypokalemia: Secondary | ICD-10-CM | POA: Diagnosis present

## 2023-07-11 DIAGNOSIS — Z79899 Other long term (current) drug therapy: Secondary | ICD-10-CM | POA: Diagnosis not present

## 2023-07-11 DIAGNOSIS — I110 Hypertensive heart disease with heart failure: Secondary | ICD-10-CM | POA: Diagnosis not present

## 2023-07-11 DIAGNOSIS — R9431 Abnormal electrocardiogram [ECG] [EKG]: Secondary | ICD-10-CM | POA: Diagnosis not present

## 2023-07-11 DIAGNOSIS — R001 Bradycardia, unspecified: Secondary | ICD-10-CM | POA: Diagnosis present

## 2023-07-11 DIAGNOSIS — K5792 Diverticulitis of intestine, part unspecified, without perforation or abscess without bleeding: Secondary | ICD-10-CM | POA: Diagnosis present

## 2023-07-11 DIAGNOSIS — E785 Hyperlipidemia, unspecified: Secondary | ICD-10-CM | POA: Diagnosis present

## 2023-07-11 DIAGNOSIS — Z86718 Personal history of other venous thrombosis and embolism: Secondary | ICD-10-CM

## 2023-07-11 DIAGNOSIS — Z96612 Presence of left artificial shoulder joint: Secondary | ICD-10-CM | POA: Diagnosis not present

## 2023-07-11 DIAGNOSIS — Z87891 Personal history of nicotine dependence: Secondary | ICD-10-CM

## 2023-07-11 DIAGNOSIS — R918 Other nonspecific abnormal finding of lung field: Secondary | ICD-10-CM | POA: Diagnosis not present

## 2023-07-11 DIAGNOSIS — E222 Syndrome of inappropriate secretion of antidiuretic hormone: Secondary | ICD-10-CM | POA: Diagnosis present

## 2023-07-11 DIAGNOSIS — J168 Pneumonia due to other specified infectious organisms: Secondary | ICD-10-CM | POA: Diagnosis not present

## 2023-07-11 DIAGNOSIS — R059 Cough, unspecified: Secondary | ICD-10-CM | POA: Diagnosis not present

## 2023-07-11 DIAGNOSIS — Z8616 Personal history of COVID-19: Secondary | ICD-10-CM | POA: Diagnosis not present

## 2023-07-11 DIAGNOSIS — E861 Hypovolemia: Secondary | ICD-10-CM | POA: Diagnosis present

## 2023-07-11 DIAGNOSIS — Z923 Personal history of irradiation: Secondary | ICD-10-CM

## 2023-07-11 DIAGNOSIS — R4182 Altered mental status, unspecified: Secondary | ICD-10-CM | POA: Diagnosis not present

## 2023-07-11 DIAGNOSIS — Z8546 Personal history of malignant neoplasm of prostate: Secondary | ICD-10-CM

## 2023-07-11 DIAGNOSIS — G9341 Metabolic encephalopathy: Secondary | ICD-10-CM | POA: Diagnosis present

## 2023-07-11 DIAGNOSIS — I480 Paroxysmal atrial fibrillation: Secondary | ICD-10-CM | POA: Diagnosis present

## 2023-07-11 DIAGNOSIS — R0602 Shortness of breath: Secondary | ICD-10-CM | POA: Diagnosis not present

## 2023-07-11 DIAGNOSIS — Z8701 Personal history of pneumonia (recurrent): Secondary | ICD-10-CM

## 2023-07-11 DIAGNOSIS — Z9079 Acquired absence of other genital organ(s): Secondary | ICD-10-CM

## 2023-07-11 DIAGNOSIS — J984 Other disorders of lung: Secondary | ICD-10-CM | POA: Diagnosis not present

## 2023-07-11 LAB — COMPREHENSIVE METABOLIC PANEL WITH GFR
ALT: 24 U/L (ref 0–44)
AST: 48 U/L — ABNORMAL HIGH (ref 15–41)
Albumin: 3.8 g/dL (ref 3.5–5.0)
Alkaline Phosphatase: 66 U/L (ref 38–126)
Anion gap: 10 (ref 5–15)
BUN: 8 mg/dL (ref 8–23)
CO2: 21 mmol/L — ABNORMAL LOW (ref 22–32)
Calcium: 8.8 mg/dL — ABNORMAL LOW (ref 8.9–10.3)
Chloride: 94 mmol/L — ABNORMAL LOW (ref 98–111)
Creatinine, Ser: 1.02 mg/dL (ref 0.61–1.24)
GFR, Estimated: >60 mL/min (ref >60)
Glucose, Bld: 157 mg/dL — ABNORMAL HIGH (ref 70–99)
Potassium: 3.6 mmol/L (ref 3.5–5.1)
Sodium: 125 mmol/L — ABNORMAL LOW (ref 135–145)
Total Bilirubin: 0.9 mg/dL (ref 0.0–1.2)
Total Protein: 6.1 g/dL — ABNORMAL LOW (ref 6.5–8.1)

## 2023-07-11 LAB — CBC WITH DIFFERENTIAL/PLATELET
Abs Immature Granulocytes: 0.03 10*3/uL (ref 0.00–0.07)
Basophils Absolute: 0.1 10*3/uL (ref 0.0–0.1)
Basophils Relative: 1 %
Eosinophils Absolute: 0 10*3/uL (ref 0.0–0.5)
Eosinophils Relative: 0 %
HCT: 34.3 % — ABNORMAL LOW (ref 39.0–52.0)
Hemoglobin: 12.1 g/dL — ABNORMAL LOW (ref 13.0–17.0)
Immature Granulocytes: 0 %
Lymphocytes Relative: 12 %
Lymphs Abs: 1.3 10*3/uL (ref 0.7–4.0)
MCH: 31.6 pg (ref 26.0–34.0)
MCHC: 35.3 g/dL (ref 30.0–36.0)
MCV: 89.6 fL (ref 80.0–100.0)
Monocytes Absolute: 1.3 10*3/uL — ABNORMAL HIGH (ref 0.1–1.0)
Monocytes Relative: 12 %
Neutro Abs: 8.3 10*3/uL — ABNORMAL HIGH (ref 1.7–7.7)
Neutrophils Relative %: 75 %
Platelets: 131 10*3/uL — ABNORMAL LOW (ref 150–400)
RBC: 3.83 MIL/uL — ABNORMAL LOW (ref 4.22–5.81)
RDW: 11.6 % (ref 11.5–15.5)
WBC: 11 10*3/uL — ABNORMAL HIGH (ref 4.0–10.5)
nRBC: 0 % (ref 0.0–0.2)

## 2023-07-11 LAB — URINALYSIS, W/ REFLEX TO CULTURE (INFECTION SUSPECTED)
Bacteria, UA: NONE SEEN
Bilirubin Urine: NEGATIVE
Glucose, UA: NEGATIVE mg/dL
Ketones, ur: NEGATIVE mg/dL
Leukocytes,Ua: NEGATIVE
Nitrite: NEGATIVE
Protein, ur: NEGATIVE mg/dL
Specific Gravity, Urine: 1.009 (ref 1.005–1.030)
pH: 5.5 (ref 5.0–8.0)

## 2023-07-11 LAB — LACTIC ACID, PLASMA: Lactic Acid, Venous: 0.8 mmol/L (ref 0.5–1.9)

## 2023-07-11 LAB — PROTIME-INR
INR: 1.2 (ref 0.8–1.2)
Prothrombin Time: 15.6 s — ABNORMAL HIGH (ref 11.4–15.2)

## 2023-07-11 MED ORDER — ACETAMINOPHEN 325 MG PO TABS
650.0000 mg | ORAL_TABLET | Freq: Once | ORAL | Status: AC
  Administered 2023-07-11: 650 mg via ORAL
  Filled 2023-07-11: qty 2

## 2023-07-11 MED ORDER — SODIUM CHLORIDE 0.9 % IV BOLUS
500.0000 mL | Freq: Once | INTRAVENOUS | Status: AC
  Administered 2023-07-11: 500 mL via INTRAVENOUS

## 2023-07-11 MED ORDER — SODIUM CHLORIDE 0.9 % IV SOLN
500.0000 mg | Freq: Once | INTRAVENOUS | Status: AC
  Administered 2023-07-11: 500 mg via INTRAVENOUS
  Filled 2023-07-11: qty 5

## 2023-07-11 MED ORDER — SODIUM CHLORIDE 0.9 % IV SOLN
1.0000 g | Freq: Once | INTRAVENOUS | Status: AC
  Administered 2023-07-11: 1 g via INTRAVENOUS
  Filled 2023-07-11: qty 10

## 2023-07-11 NOTE — ED Provider Notes (Signed)
 [place] EMERGENCY DEPARTMENT AT [facility] Provider Note   CSN: [id number] Arrival date & time: 07/11/23  2038     History  Chief Complaint  Patient presents with   Code Sepsis    Brandon Simmons. is a 78 y.o. male.  Patient is a 78 year old male with a past medical history of A-fib on Eliquis  and Tikosyn , prostate cancer not currently on any treatment, asthma presenting to the emergency department with fever and altered mental status.  Patient states that he developed a fever yesterday with a productive cough.  States he has associated runny nose.  States that he was seen by his primary doctor yesterday and tested negative for COVID, flu and RSV and had a negative chest x-ray.  Was started on cefdinir  for presumed pneumonia versus bronchitis.  He states that today in the evening he started to have some confusion.  His wife states that he will start making a sentence and forget what he is trying to say or say something that does not make sense.  She states that he has had this happen before with pneumonia in the past.  Patient states that he has had a headache since yesterday, denies any vision changes, numbness or weakness, neck pain or neck stiffness.  States that he has been taking Tylenol  for his fevers at home.  The history is provided by the patient and the spouse.       Home Medications Prior to Admission medications   Medication Sig Start Date End Date Taking? Authorizing Provider  acetaminophen  (TYLENOL ) 500 MG tablet Take 500 mg by mouth at bedtime.    [provider]  apixaban  (ELIQUIS ) 5 MG TABS tablet Take 5 mg by mouth 2 (two) times daily.     [provider]  Ascorbic Acid  (VITAMIN C  PO) Take by mouth. Taking 1 chewable by mouth daily- 500mg     [provider]  atorvastatin  (LIPITOR) 20 MG tablet Take 20 mg by mouth daily.    [provider]  Azelastine  HCl 137 MCG/SPRAY SOLN Place 1 spray into both nostrils 2  (two) times daily.    [provider]  diltiazem  (CARDIZEM ) 30 MG tablet Take 1 tablet every 4 hours AS NEEDED for afib rapid heart rate over 100 02/02/23   Fenton, Clint R, PA  dofetilide  (TIKOSYN ) 500 MCG capsule Take 1 capsule (500 mcg total) by mouth 2 (two) times daily. 02/14/23   Fenton, Clint R, PA  EPINEPHrine  0.3 mg/0.3 mL IJ SOAJ injection Inject 0.3 mg into the muscle as needed for anaphylaxis. 04/06/19   [provider]  fexofenadine (ALLEGRA ALLERGY) 180 MG tablet Take 1 tablet by mouth daily. 08/17/21   [provider]  fluticasone  (FLONASE ) 50 MCG/ACT nasal spray Place 1 spray into both nostrils every evening. 08/19/21   [provider]  GLUCOSAMINE-CHONDROITIN PO Take 1,500 mg by mouth daily.    [provider]  ipratropium (ATROVENT ) 0.06 % nasal spray Place 2 sprays into both nostrils at bedtime. 10/09/22   [provider]  losartan  (COZAAR ) 50 MG tablet Take 25 mg by mouth daily. 10/09/22   [provider]  Mometasone  Furoate (ASMANEX  HFA) 100 MCG/ACT AERO Inhale 1 puff into the lungs every evening.    [provider]  montelukast  (SINGULAIR ) 10 MG tablet Take 10 mg by mouth at bedtime.    [provider]  Multiple Vitamin (MULTIVITAMIN ADULT PO) Take by mouth. L-Lycine- Taking 1 tablet by mouth daily - 500mg   [provider]  Naphazoline-Pheniramine (OPCON-A ) 0.027-0.315 % SOLN Place 1 drop into both eyes as needed (eye allergies (red/irritated eyes)).    [provider]  omeprazole (PRILOSEC) 20 MG capsule Take 20 mg by mouth 2 (two) times daily before a meal.     [provider]  potassium chloride  (KLOR-CON  M10) 10 MEQ tablet Take 3 tablets by mouth daily 04/30/23   Fenton, Clint R, PA  Probiotic Product (ALIGN) 4 MG CAPS Take 4 mg by mouth every evening.    [provider]  psyllium (METAMUCIL) 58.6 % powder Taking 2 tsp twice daily    [provider]   VENTOLIN  HFA 108 (90 Base) MCG/ACT inhaler Inhale 1-2 puffs into the lungs every 6 (six) hours as needed for wheezing or shortness of breath. 12/30/18   [provider]  VITAMIN D  PO Take 1,000 Units by mouth daily. Taking 1 gel capsule daily    [provider]      Allergies    Patient has no known allergies.    Review of Systems   Review of Systems  Physical Exam Updated Vital Signs BP 128/70 (BP Location: Left Arm)   Pulse 82   Temp (!) 100.4 F (38 C) (Oral)   Resp 20   SpO2 99%  Physical Exam Vitals and nursing note reviewed.  Constitutional:      General: He is not in acute distress.    Appearance: Normal appearance.  HENT:     Head: Normocephalic and atraumatic.     Nose: Congestion present.     Mouth/Throat:     Mouth: Mucous membranes are moist.     Pharynx: Oropharynx is clear.  Eyes:     Extraocular Movements: Extraocular movements intact.     Conjunctiva/sclera: Conjunctivae normal.  Cardiovascular:     Rate and Rhythm: Normal rate and regular rhythm.     Heart sounds: Normal heart sounds.  Pulmonary:     Effort: Pulmonary effort is normal.     Breath sounds: Normal breath sounds.     Comments: Repetitively coughing on exam Abdominal:     General: Abdomen is flat.     Palpations: Abdomen is soft.     Tenderness: There is no abdominal tenderness.  Musculoskeletal:        General: Normal range of motion.     Cervical back: Normal range of motion and neck supple. No rigidity.  Skin:    General: Skin is warm and dry.  Neurological:     General: No focal deficit present.     Mental Status: He is alert and oriented to person, place, and time.     Cranial Nerves: No cranial nerve deficit.     Sensory: No sensory deficit.     Motor: No weakness.     Coordination: Coordination normal.     ED Results / Procedures / Treatments   Labs (all labs ordered are listed, but only abnormal results are displayed) Labs Reviewed  COMPREHENSIVE  METABOLIC PANEL WITH GFR - Abnormal; Notable for the following components:      Result Value   Sodium 125 (*)    Chloride 94 (*)    CO2 21 (*)    Glucose, Bld 157 (*)    Calcium  8.8 (*)    Total Protein 6.1 (*)    AST 48 (*)    All other components within normal limits  CBC WITH DIFFERENTIAL/PLATELET - Abnormal; Notable for the following components:   WBC 11.0 (*)  RBC 3.83 (*)    Hemoglobin 12.1 (*)    HCT 34.3 (*)    Platelets 131 (*)    Neutro Abs 8.3 (*)    Monocytes Absolute 1.3 (*)    All other components within normal limits  PROTIME-INR - Abnormal; Notable for the following components:   Prothrombin Time 15.6 (*)    All other components within normal limits  URINALYSIS, W/ REFLEX TO CULTURE (INFECTION SUSPECTED) - Abnormal; Notable for the following components:   Hgb urine dipstick TRACE (*)    All other components within normal limits  CULTURE, BLOOD (ROUTINE X 2)  CULTURE, BLOOD (ROUTINE X 2)  LACTIC ACID, PLASMA    EKG EKG Interpretation Date/Time:  Wednesday July 11 2023 22:17:59 EDT Ventricular Rate:  92 PR Interval:  112 QRS Duration:  92 QT Interval:  344 QTC Calculation: 426 R Axis:   -6  Text Interpretation: Sinus or ectopic atrial rhythm Borderline short PR interval No significant change was found Confirmed by Celesta Coke ([id number]) on 07/11/2023 10:52:02 PM  Radiology CT Head Wo Contrast Result Date: 07/11/2023 CLINICAL DATA:  Altered mental status EXAM: CT HEAD WITHOUT CONTRAST TECHNIQUE: Contiguous axial images were obtained from the base of the skull through the vertex without intravenous contrast. RADIATION DOSE REDUCTION: This exam was performed according to the departmental dose-optimization program which includes automated exposure control, adjustment of the mA and/or kV according to patient size and/or use of iterative reconstruction technique. COMPARISON:  None Available. FINDINGS: Brain: No mass,hemorrhage or extra-axial collection. Normal  appearance of the parenchyma and CSF spaces. Vascular: No hyperdense vessel or unexpected vascular calcification. Skull: The visualized skull base, calvarium and extracranial soft tissues are normal. Sinuses/Orbits: No fluid levels or advanced mucosal thickening of the visualized paranasal sinuses. No mastoid or middle ear effusion. Normal orbits. Other: None. IMPRESSION: Normal head CT. Electronically Signed   By: Juanetta Nordmann M.D.   On: 07/11/2023 23:35   DG Chest Port 1 View Result Date: 07/11/2023 CLINICAL DATA:  Sepsis. EXAM: PORTABLE CHEST 1 VIEW COMPARISON:  01/19/2023 FINDINGS: The cardiac silhouette, mediastinal and hilar contours are within normal limits. Streaky bibasilar density could be atelectasis or early infiltrates. No pleural effusions. No pulmonary lesions. The bony thorax is intact. Stable left shoulder arthroplasty. IMPRESSION: Streaky bibasilar density could be atelectasis or early infiltrates. Electronically Signed   By: Marrian Siva M.D.   On: 07/11/2023 22:01    Procedures Procedures    Medications Ordered in ED Medications  cefTRIAXone  (ROCEPHIN ) 1 g in sodium chloride  0.9 % 100 mL IVPB (1 g Intravenous New Bag/Given 07/11/23 2314)  azithromycin  (ZITHROMAX ) 500 mg in sodium chloride  0.9 % 250 mL IVPB (500 mg Intravenous New Bag/Given 07/11/23 2315)  acetaminophen  (TYLENOL ) tablet 650 mg (650 mg Oral Given 07/11/23 2242)  sodium chloride  0.9 % bolus 500 mL (500 mLs Intravenous New Bag/Given 07/11/23 2313)    ED Course/ Medical Decision Making/ A&P Clinical Course as of 07/11/23 2338  Wed Jul 11, 2023  2249 Streaky bibasilar opacities, concerning for pneumonia in the setting of the patient's symptoms. Antibiotics will be ordered. [VK]  2308 Na 125, will be given gentle fluid repletion. This in combination with the pneumonia likely contributing to confusion. Recommended admission for further treatment. [VK]    Clinical Course User Index [VK] Kingsley, Lashawnna Lambrecht K, DO  Medical Decision Making This patient presents to the ED with chief complaint(s) of fever, AMS with pertinent past medical history of A fib, prostate cancer, asthma which further complicates the presenting complaint. The complaint involves an extensive differential diagnosis and also carries with it a high risk of complications and morbidity.    The differential diagnosis includes sepsis, pneumonia, pneumothorax, pulmonary edema, pleural effusion, viral syndrome, dehydration, electrolyte abnormality, UTI, encephalopathy, meningitis less likely as he has no neck pain or neck stiffness or other meningeal signs on exam, no focal neurologic deficits making CVA or TIA unlikely  Additional history obtained: Additional history obtained from spouse Records reviewed outpatient cardiology records  ED Course and Reassessment: On patient's arrival he is afebrile and otherwise hemodynamically stable in no acute distress.  He occasionally will have difficulty remembering the correct answer to questions but is overall oriented x 3 without focal deficits.  Patient will have workup performed to evaluate for cause of his fever and confusion and will be closely reassessed.  Independent labs interpretation:  The following labs were independently interpreted: hyponatremia, mild leukocytosis  Independent visualization of imaging: - I independently visualized the following imaging with scope of interpretation limited to determining acute life threatening conditions related to emergency care: CXR, which revealed streaking opacities concerning for pneumonia  Consultation: - Consulted or discussed management/test interpretation w/ external professional: hospitalist  Consideration for admission or further workup: patient requires admission for AMS in the setting of pneumonia and hyponatremia Social Determinants of health: N/A    Amount and/or Complexity of Data Reviewed Labs:  ordered. Radiology: ordered.  Risk OTC drugs. Decision regarding hospitalization.          Final Clinical Impression(s) / ED Diagnoses Final diagnoses:  Confusion  Pneumonia of both lungs due to infectious organism, unspecified part of lung  Hyponatremia    Rx / DC Orders ED Discharge Orders     None         Kingsley, Kalisa Girtman K, DO 07/11/23 2338

## 2023-07-11 NOTE — ED Notes (Signed)
Pt reports he is feeling much better.

## 2023-07-11 NOTE — ED Triage Notes (Signed)
 Pt reports confusion onset tonight. Pt reports it started a few weeks ago. Febrile since Monday. Takes tekosyn. Given antibiotic Monday by pcp for covid/flu/ strep/ chest xray/ urinalysis.  Confusion is new onset symptom that scared wife to bring him. Pt is normally alert and oriented and has no problems performing ADLS.

## 2023-07-12 ENCOUNTER — Other Ambulatory Visit: Payer: Self-pay

## 2023-07-12 DIAGNOSIS — R918 Other nonspecific abnormal finding of lung field: Secondary | ICD-10-CM | POA: Diagnosis not present

## 2023-07-12 DIAGNOSIS — D649 Anemia, unspecified: Secondary | ICD-10-CM | POA: Diagnosis present

## 2023-07-12 DIAGNOSIS — R4182 Altered mental status, unspecified: Secondary | ICD-10-CM | POA: Diagnosis not present

## 2023-07-12 DIAGNOSIS — J189 Pneumonia, unspecified organism: Secondary | ICD-10-CM | POA: Diagnosis present

## 2023-07-12 DIAGNOSIS — R531 Weakness: Secondary | ICD-10-CM | POA: Diagnosis not present

## 2023-07-12 DIAGNOSIS — Z87891 Personal history of nicotine dependence: Secondary | ICD-10-CM | POA: Diagnosis not present

## 2023-07-12 DIAGNOSIS — Z96612 Presence of left artificial shoulder joint: Secondary | ICD-10-CM | POA: Diagnosis not present

## 2023-07-12 DIAGNOSIS — Z8616 Personal history of COVID-19: Secondary | ICD-10-CM | POA: Diagnosis not present

## 2023-07-12 DIAGNOSIS — K219 Gastro-esophageal reflux disease without esophagitis: Secondary | ICD-10-CM | POA: Diagnosis present

## 2023-07-12 DIAGNOSIS — Z79899 Other long term (current) drug therapy: Secondary | ICD-10-CM | POA: Diagnosis not present

## 2023-07-12 DIAGNOSIS — R059 Cough, unspecified: Secondary | ICD-10-CM | POA: Diagnosis not present

## 2023-07-12 DIAGNOSIS — Z923 Personal history of irradiation: Secondary | ICD-10-CM | POA: Diagnosis not present

## 2023-07-12 DIAGNOSIS — D696 Thrombocytopenia, unspecified: Secondary | ICD-10-CM

## 2023-07-12 DIAGNOSIS — I10 Essential (primary) hypertension: Secondary | ICD-10-CM | POA: Diagnosis not present

## 2023-07-12 DIAGNOSIS — R0602 Shortness of breath: Secondary | ICD-10-CM | POA: Diagnosis not present

## 2023-07-12 DIAGNOSIS — J45909 Unspecified asthma, uncomplicated: Secondary | ICD-10-CM | POA: Diagnosis present

## 2023-07-12 DIAGNOSIS — E876 Hypokalemia: Secondary | ICD-10-CM | POA: Diagnosis present

## 2023-07-12 DIAGNOSIS — Z7901 Long term (current) use of anticoagulants: Secondary | ICD-10-CM | POA: Diagnosis not present

## 2023-07-12 DIAGNOSIS — I110 Hypertensive heart disease with heart failure: Secondary | ICD-10-CM | POA: Diagnosis present

## 2023-07-12 DIAGNOSIS — Z801 Family history of malignant neoplasm of trachea, bronchus and lung: Secondary | ICD-10-CM | POA: Diagnosis not present

## 2023-07-12 DIAGNOSIS — R001 Bradycardia, unspecified: Secondary | ICD-10-CM | POA: Diagnosis present

## 2023-07-12 DIAGNOSIS — R410 Disorientation, unspecified: Secondary | ICD-10-CM | POA: Diagnosis present

## 2023-07-12 DIAGNOSIS — I5032 Chronic diastolic (congestive) heart failure: Secondary | ICD-10-CM | POA: Diagnosis present

## 2023-07-12 DIAGNOSIS — I480 Paroxysmal atrial fibrillation: Secondary | ICD-10-CM | POA: Diagnosis present

## 2023-07-12 DIAGNOSIS — E222 Syndrome of inappropriate secretion of antidiuretic hormone: Secondary | ICD-10-CM | POA: Diagnosis present

## 2023-07-12 DIAGNOSIS — Z8249 Family history of ischemic heart disease and other diseases of the circulatory system: Secondary | ICD-10-CM | POA: Diagnosis not present

## 2023-07-12 DIAGNOSIS — E785 Hyperlipidemia, unspecified: Secondary | ICD-10-CM | POA: Diagnosis present

## 2023-07-12 DIAGNOSIS — E871 Hypo-osmolality and hyponatremia: Secondary | ICD-10-CM | POA: Diagnosis not present

## 2023-07-12 DIAGNOSIS — E861 Hypovolemia: Secondary | ICD-10-CM | POA: Diagnosis present

## 2023-07-12 DIAGNOSIS — K5792 Diverticulitis of intestine, part unspecified, without perforation or abscess without bleeding: Secondary | ICD-10-CM | POA: Diagnosis present

## 2023-07-12 DIAGNOSIS — Z1152 Encounter for screening for COVID-19: Secondary | ICD-10-CM | POA: Diagnosis not present

## 2023-07-12 DIAGNOSIS — Z800 Family history of malignant neoplasm of digestive organs: Secondary | ICD-10-CM | POA: Diagnosis not present

## 2023-07-12 DIAGNOSIS — G9341 Metabolic encephalopathy: Secondary | ICD-10-CM | POA: Diagnosis present

## 2023-07-12 LAB — COMPREHENSIVE METABOLIC PANEL WITH GFR
ALT: 28 U/L (ref 0–44)
AST: 56 U/L — ABNORMAL HIGH (ref 15–41)
Albumin: 3.3 g/dL — ABNORMAL LOW (ref 3.5–5.0)
Alkaline Phosphatase: 55 U/L (ref 38–126)
Anion gap: 8 (ref 5–15)
BUN: 8 mg/dL (ref 8–23)
CO2: 20 mmol/L — ABNORMAL LOW (ref 22–32)
Calcium: 8 mg/dL — ABNORMAL LOW (ref 8.9–10.3)
Chloride: 97 mmol/L — ABNORMAL LOW (ref 98–111)
Creatinine, Ser: 0.7 mg/dL (ref 0.61–1.24)
GFR, Estimated: >60 mL/min (ref >60)
Glucose, Bld: 149 mg/dL — ABNORMAL HIGH (ref 70–99)
Potassium: 3.4 mmol/L — ABNORMAL LOW (ref 3.5–5.1)
Sodium: 125 mmol/L — ABNORMAL LOW (ref 135–145)
Total Bilirubin: 1.3 mg/dL — ABNORMAL HIGH (ref 0.0–1.2)
Total Protein: 6 g/dL — ABNORMAL LOW (ref 6.5–8.1)

## 2023-07-12 LAB — SODIUM
Sodium: 126 mmol/L — ABNORMAL LOW (ref 135–145)
Sodium: 125 mmol/L — ABNORMAL LOW (ref 135–145)

## 2023-07-12 LAB — CBC
HCT: 34.2 % — ABNORMAL LOW (ref 39.0–52.0)
Hemoglobin: 11.8 g/dL — ABNORMAL LOW (ref 13.0–17.0)
MCH: 31.6 pg (ref 26.0–34.0)
MCHC: 34.5 g/dL (ref 30.0–36.0)
MCV: 91.7 fL (ref 80.0–100.0)
Platelets: 117 10*3/uL — ABNORMAL LOW (ref 150–400)
RBC: 3.73 MIL/uL — ABNORMAL LOW (ref 4.22–5.81)
RDW: 11.7 % (ref 11.5–15.5)
WBC: 11.3 10*3/uL — ABNORMAL HIGH (ref 4.0–10.5)
nRBC: 0 % (ref 0.0–0.2)

## 2023-07-12 LAB — EXPECTORATED SPUTUM ASSESSMENT W GRAM STAIN, RFLX TO RESP C

## 2023-07-12 LAB — RESP PANEL BY RT-PCR (RSV, FLU A&B, COVID)  RVPGX2
Influenza A by PCR: NEGATIVE
Influenza B by PCR: NEGATIVE
Resp Syncytial Virus by PCR: NEGATIVE
SARS Coronavirus 2 by RT PCR: NEGATIVE

## 2023-07-12 LAB — MRSA NEXT GEN BY PCR, NASAL: MRSA by PCR Next Gen: NOT DETECTED

## 2023-07-12 LAB — OSMOLALITY, URINE: Osmolality, Ur: 421 mosm/kg (ref 300–900)

## 2023-07-12 LAB — SODIUM, URINE, RANDOM: Sodium, Ur: <10 mmol/L

## 2023-07-12 LAB — STREP PNEUMONIAE URINARY ANTIGEN: Strep Pneumo Urinary Antigen: NEGATIVE

## 2023-07-12 LAB — MAGNESIUM: Magnesium: 1.9 mg/dL (ref 1.7–2.4)

## 2023-07-12 LAB — OSMOLALITY: Osmolality: 270 mosm/kg — ABNORMAL LOW (ref 275–295)

## 2023-07-12 MED ORDER — SODIUM CHLORIDE 0.9 % IV SOLN
100.0000 mg | Freq: Two times a day (BID) | INTRAVENOUS | Status: DC
  Administered 2023-07-12 – 2023-07-14 (×4): 100 mg via INTRAVENOUS
  Filled 2023-07-12 (×4): qty 100

## 2023-07-12 MED ORDER — APIXABAN 5 MG PO TABS
5.0000 mg | ORAL_TABLET | Freq: Two times a day (BID) | ORAL | Status: DC
  Administered 2023-07-12 – 2023-07-17 (×11): 5 mg via ORAL
  Filled 2023-07-12 (×11): qty 1

## 2023-07-12 MED ORDER — APIXABAN 5 MG PO TABS: 5.0000 mg | ORAL_TABLET | Freq: Two times a day (BID) | ORAL | Status: DC

## 2023-07-12 MED ORDER — SODIUM CHLORIDE 0.9 % IV SOLN: INTRAVENOUS | Status: DC

## 2023-07-12 MED ORDER — ACETAMINOPHEN 325 MG PO TABS
650.0000 mg | ORAL_TABLET | Freq: Four times a day (QID) | ORAL | Status: DC | PRN
  Administered 2023-07-12 – 2023-07-17 (×14): 650 mg via ORAL
  Filled 2023-07-12 (×14): qty 2

## 2023-07-12 MED ORDER — ALBUTEROL SULFATE (2.5 MG/3ML) 0.083% IN NEBU
2.5000 mg | INHALATION_SOLUTION | Freq: Four times a day (QID) | RESPIRATORY_TRACT | Status: DC | PRN
  Administered 2023-07-13: 2.5 mg via RESPIRATORY_TRACT
  Filled 2023-07-12: qty 3

## 2023-07-12 MED ORDER — ATORVASTATIN CALCIUM 20 MG PO TABS
20.0000 mg | ORAL_TABLET | Freq: Every day | ORAL | Status: DC
  Administered 2023-07-12 – 2023-07-16 (×5): 20 mg via ORAL
  Filled 2023-07-12 (×5): qty 1

## 2023-07-12 MED ORDER — DOFETILIDE 250 MCG PO CAPS
500.0000 ug | ORAL_CAPSULE | Freq: Two times a day (BID) | ORAL | Status: DC
  Administered 2023-07-12 – 2023-07-17 (×11): 500 ug via ORAL
  Filled 2023-07-12 (×12): qty 2

## 2023-07-12 MED ORDER — HYDROCOD POLI-CHLORPHE POLI ER 10-8 MG/5ML PO SUER
5.0000 mL | Freq: Every evening | ORAL | Status: DC | PRN
  Administered 2023-07-12 – 2023-07-14 (×3): 5 mL via ORAL
  Filled 2023-07-12 (×3): qty 5

## 2023-07-12 MED ORDER — PANTOPRAZOLE SODIUM 40 MG PO TBEC
40.0000 mg | DELAYED_RELEASE_TABLET | Freq: Every day | ORAL | Status: DC
  Administered 2023-07-12 – 2023-07-17 (×6): 40 mg via ORAL
  Filled 2023-07-12 (×6): qty 1

## 2023-07-12 MED ORDER — SODIUM CHLORIDE 0.9 % IV SOLN
1.0000 g | INTRAVENOUS | Status: DC
  Administered 2023-07-12 – 2023-07-16 (×5): 1 g via INTRAVENOUS
  Filled 2023-07-12 (×5): qty 10

## 2023-07-12 MED ORDER — SODIUM CHLORIDE 0.9 % IV SOLN: 1.0000 g | INTRAVENOUS | Status: DC

## 2023-07-12 MED ORDER — ACETAMINOPHEN 650 MG RE SUPP: 650.0000 mg | Freq: Four times a day (QID) | RECTAL | Status: DC | PRN

## 2023-07-12 MED ORDER — HYDROCOD POLI-CHLORPHE POLI ER 10-8 MG/5ML PO SUER
5.0000 mL | Freq: Two times a day (BID) | ORAL | Status: DC | PRN
  Administered 2023-07-12: 5 mL via ORAL
  Filled 2023-07-12: qty 5

## 2023-07-12 MED ORDER — GUAIFENESIN-CODEINE 100-10 MG/5ML PO SOLN
5.0000 mL | Freq: Four times a day (QID) | ORAL | Status: DC | PRN
  Administered 2023-07-12: 5 mL via ORAL
  Filled 2023-07-12: qty 5

## 2023-07-12 MED ORDER — GUAIFENESIN-DM 100-10 MG/5ML PO SYRP
10.0000 mL | ORAL_SOLUTION | ORAL | Status: DC | PRN
  Administered 2023-07-12: 10 mL via ORAL
  Filled 2023-07-12: qty 10

## 2023-07-12 MED ORDER — PHENOL 1.4 % MT LIQD
1.0000 | OROMUCOSAL | Status: DC | PRN
  Filled 2023-07-12: qty 177

## 2023-07-12 MED ORDER — ALBUTEROL SULFATE (2.5 MG/3ML) 0.083% IN NEBU: 2.5000 mg | INHALATION_SOLUTION | RESPIRATORY_TRACT | Status: DC | PRN

## 2023-07-12 MED ORDER — DOFETILIDE 250 MCG PO CAPS: 500.0000 ug | ORAL_CAPSULE | Freq: Two times a day (BID) | ORAL | Status: DC

## 2023-07-12 MED ORDER — SODIUM CHLORIDE 0.9 % IV SOLN: 100.0000 mg | Freq: Two times a day (BID) | INTRAVENOUS | Status: DC

## 2023-07-12 MED ORDER — POTASSIUM CHLORIDE CRYS ER 20 MEQ PO TBCR
40.0000 meq | EXTENDED_RELEASE_TABLET | ORAL | Status: AC
  Administered 2023-07-12 (×2): 40 meq via ORAL
  Filled 2023-07-12 (×2): qty 2

## 2023-07-12 NOTE — ED Notes (Signed)
 Report given to Tiara, Charity fundraiser at [employer]. Patient updated.

## 2023-07-12 NOTE — Progress Notes (Signed)
 PROGRESS NOTE    Brandon Simmons.  JWJ:[id number] DOB: [birth year] DOA: 07/11/2023 PCP: Bertha Broad, MD   Brief Narrative: Brandon Simmons. is a 78 y.o. male with a history of proximal atrial fibrillation, hypertension, asthma, prostate cancer status post prostatectomy and radiation therapy, DVT, GERD, hyperlipidemia.  Patient presented secondary to fever, cough, and altered mental status/confusion with evidence of community-acquired pneumonia.  Empiric antibiotics started.  Blood cultures ordered and are pending.   Assessment and Plan:  Community-acquired pneumonia Patient with fever and leukocytosis. Chest x-ray with possible bibasilar early infiltrates. Blood cultures obtained. Empiric Ceftriaxone  and azithromycin  started. -Continue Azithromycin  and Ceftriaxone  -Follow-up blood cultures  Hyponatremia Sodium of 125 on admission. Unclear etiology but presumed secondary to hypovolemia. Serum osmolality pending. -Obtain urine sodium and urine osmolality -Continue IV fluids  Hypokalemia -Potassium supplementation to keep potassium >4  Acute metabolic encephalopathy Presumed secondary to pneumonia. Hyponatremia likely not bad enough to contribute. CT head negative for acute process.  Normocytic anemia Mild. -Trend CBC  Chronic thrombocytopenia Unclear etiology. Platelets of 131 on admission, down to 117 today. Likely reactive related to acute illness.  Paroxysmal atrial fibrillation Patient is on Eliquis  and dofetilide  as an outpatient. From last cardiology note, diltiazem  180 mg daily discontinued secondary to bradycardia. -Continue Eliquis  -Continue dofetilide  -Keep potassium greater than 4 and magnesium  greater than 2  Asthma Stable.  Primary hypertension Patient is on losartan  and diltiazem  as an outpatient.  GERD Patient is on omeprazole as an outpatient. - Protonix  while inpatient  Hyperlipidemia -Continue Lipitor   DVT prophylaxis:  Eliquis  Code Status:   Code Status: Full Code Family Communication: Wife at bedside Disposition Plan: Discharge home likely in 1-2 days pending improved sodium and if remaining afebrile.   Consultants:  None  Procedures:  None  Antimicrobials: Ceftriaxone  Doxycycline     Subjective: Persistent cough, mostly nonproductive. Feeling better.  Objective: BP 120/67 (BP Location: Left Arm)   Pulse 92   Temp 100.1 F (37.8 C) (Oral)   Resp 17   Ht 5\' 10"  (1.778 m)   Wt 97.2 kg   SpO2 97%   BMI 30.75 kg/m   Examination:  General exam: Appears calm and comfortable Respiratory system: Clear to auscultation. Respiratory effort normal. Cardiovascular system: S1 & S2 heard. Normal rate. No murmurs, rubs, gallops or clicks. Gastrointestinal system: Abdomen is nondistended, soft and nontender. Normal bowel sounds heard. Central nervous system: Alert and oriented. No focal neurological deficits. Musculoskeletal: No edema. No calf tenderness Psychiatry: Judgement and insight appear normal. Mood & affect appropriate.    Data Reviewed: I have personally reviewed following labs and imaging studies  CBC Lab Results  Component Value Date   WBC 11.3 (H) 07/12/2023   RBC 3.73 (L) 07/12/2023   HGB 11.8 (L) 07/12/2023   HCT 34.2 (L) 07/12/2023   MCV 91.7 07/12/2023   MCH 31.6 07/12/2023   PLT 117 (L) 07/12/2023   MCHC 34.5 07/12/2023   RDW 11.7 07/12/2023   LYMPHSABS 1.3 07/11/2023   MONOABS 1.3 (H) 07/11/2023   EOSABS 0.0 07/11/2023   BASOSABS 0.1 07/11/2023     Last metabolic panel Lab Results  Component Value Date   NA 125 (L) 07/12/2023   K 3.4 (L) 07/12/2023   CL 97 (L) 07/12/2023   CO2 20 (L) 07/12/2023   BUN 8 07/12/2023   CREATININE 0.70 07/12/2023   GLUCOSE 149 (H) 07/12/2023   GFRNONAA >60 07/12/2023   GFRAA 74 11/03/2019   CALCIUM  8.0 (  L) 07/12/2023   PHOS 2.8 11/29/2021   PROT 6.0 (L) 07/12/2023   ALBUMIN 3.3 (L) 07/12/2023   BILITOT 1.3 (H) 07/12/2023    ALKPHOS 55 07/12/2023   AST 56 (H) 07/12/2023   ALT 28 07/12/2023   ANIONGAP 8 07/12/2023    GFR: Estimated Creatinine Clearance: 89 mL/min (by C-G formula based on SCr of 0.7 mg/dL).  No results found for this or any previous visit (from the past 240 hours).    Radiology Studies: CT Head Wo Contrast Result Date: 07/11/2023 CLINICAL DATA:  Altered mental status EXAM: CT HEAD WITHOUT CONTRAST TECHNIQUE: Contiguous axial images were obtained from the base of the skull through the vertex without intravenous contrast. RADIATION DOSE REDUCTION: This exam was performed according to the departmental dose-optimization program which includes automated exposure control, adjustment of the mA and/or kV according to patient size and/or use of iterative reconstruction technique. COMPARISON:  None Available. FINDINGS: Brain: No mass,hemorrhage or extra-axial collection. Normal appearance of the parenchyma and CSF spaces. Vascular: No hyperdense vessel or unexpected vascular calcification. Skull: The visualized skull base, calvarium and extracranial soft tissues are normal. Sinuses/Orbits: No fluid levels or advanced mucosal thickening of the visualized paranasal sinuses. No mastoid or middle ear effusion. Normal orbits. Other: None. IMPRESSION: Normal head CT. Electronically Signed   By: Juanetta Nordmann M.D.   On: 07/11/2023 23:35   DG Chest Port 1 View Result Date: 07/11/2023 CLINICAL DATA:  Sepsis. EXAM: PORTABLE CHEST 1 VIEW COMPARISON:  01/19/2023 FINDINGS: The cardiac silhouette, mediastinal and hilar contours are within normal limits. Streaky bibasilar density could be atelectasis or early infiltrates. No pleural effusions. No pulmonary lesions. The bony thorax is intact. Stable left shoulder arthroplasty. IMPRESSION: Streaky bibasilar density could be atelectasis or early infiltrates. Electronically Signed   By: Marrian Siva M.D.   On: 07/11/2023 22:01      LOS: 0 days    Aneita Keens, MD [facility] 07/12/2023, 7:46 AM   If 7PM-7AM, please contact night-coverage [email or web address]

## 2023-07-12 NOTE — Evaluation (Signed)
 Physical Therapy Evaluation Patient Details Name: Brandon Simmons. MRN: [id number] DOB: 1945/12/21 Today's Date: 07/12/2023  History of Present Illness  78 y.o. male presented secondary to fever, cough, and altered mental status/confusion with evidence of community-acquired pneumonia.with a history of proximal atrial fibrillation, hypertension, asthma, prostate cancer status post prostatectomy and radiation therapy, DVT, GERD, hyperlipidemia.  Clinical Impression  Pt admitted with above diagnosis. Pt ambulated 180' with RW, distance limited by fatigue, 1/4 dyspnea, no loss of balance, SpO2 98% on room air. Pt reports he is independent with mobility at baseline, and that he exercises 3-4x/week. He has had a decrease in activity tolerance.  Pt currently with functional limitations due to the deficits listed below (see PT Problem List). Pt will benefit from acute skilled PT to increase their independence and safety with mobility to allow discharge.           If plan is discharge home, recommend the following: Assistance with cooking/housework;Help with stairs or ramp for entrance   Can travel by private vehicle        Equipment Recommendations Rollator (4 wheels)  Recommendations for Other Services       Functional Status Assessment Patient has had a recent decline in their functional status and demonstrates the ability to make significant improvements in function in a reasonable and predictable amount of time.     Precautions / Restrictions Precautions Precautions: None Recall of Precautions/Restrictions: Intact Restrictions Weight Bearing Restrictions Per Provider Order: No      Mobility  Bed Mobility Overal bed mobility: Modified Independent             General bed mobility comments: HOB up, used rail    Transfers Overall transfer level: Needs assistance Equipment used: Rolling walker (2 wheels) Transfers: Sit to/from Stand Sit to Stand: Supervision            General transfer comment: VCs hand placement    Ambulation/Gait Ambulation/Gait assistance: Supervision Gait Distance (Feet): 180 Feet Assistive device: Rolling walker (2 wheels) Gait Pattern/deviations: Step-through pattern, Decreased stride length, Trunk flexed Gait velocity: WFL     General Gait Details: VCs positioning in RW, SpO2 98% on room air, no loss of balance  Stairs            Wheelchair Mobility     Tilt Bed    Modified Rankin (Stroke Patients Only)       Balance Overall balance assessment: Modified Independent                                           Pertinent Vitals/Pain Pain Assessment Pain Assessment: No/denies pain    Home Living Family/patient expects to be discharged to:: Private residence Living Arrangements: Spouse/significant other Available Help at Discharge: Family;Available 24 hours/day Type of Home: House Home Access: Level entry       Home Layout: One level Home Equipment: Grab bars - tub/shower Additional Comments: Lives at [facility], Independent living    Prior Function Prior Level of Function : Independent/Modified Independent             Mobility Comments: walks without AD, denies falls in past 6 months, goes to exercise classes at [facility] 3-4x/week       Extremity/Trunk Assessment   Upper Extremity Assessment Upper Extremity Assessment: Overall WFL for tasks assessed    Lower Extremity Assessment Lower Extremity Assessment: Overall WFL for  tasks assessed    Cervical / Trunk Assessment Cervical / Trunk Assessment: Normal  Communication   Communication Communication: No apparent difficulties    Cognition Arousal: Alert Behavior During Therapy: WFL for tasks assessed/performed   PT - Cognitive impairments: No apparent impairments                         Following commands: Intact       Cueing       General Comments      Exercises     Assessment/Plan     PT Assessment Patient needs continued PT services  PT Problem List Decreased activity tolerance;Decreased mobility       PT Treatment Interventions Gait training;Therapeutic exercise;Functional mobility training;Therapeutic activities    PT Goals (Current goals can be found in the Care Plan section)  Acute Rehab PT Goals Patient Stated Goal: return to exercise classes PT Goal Formulation: With patient Time For Goal Achievement: 07/26/23 Potential to Achieve Goals: Good    Frequency Min 3X/week     Co-evaluation               AM-PAC PT "6 Clicks" Mobility  Outcome Measure Help needed turning from your back to your side while in a flat bed without using bedrails?: None Help needed moving from lying on your back to sitting on the side of a flat bed without using bedrails?: None Help needed moving to and from a bed to a chair (including a wheelchair)?: None Help needed standing up from a chair using your arms (e.g., wheelchair or bedside chair)?: None Help needed to walk in hospital room?: None Help needed climbing 3-5 steps with a railing? : A Little 6 Click Score: 23    End of Session Equipment Utilized During Treatment: Gait belt Activity Tolerance: Patient tolerated treatment well Patient left: in chair;with call bell/phone within reach;with family/visitor present Nurse Communication: Mobility status PT Visit Diagnosis: Difficulty in walking, not elsewhere classified (R26.2)    Time: 2130-8657 PT Time Calculation (min) (ACUTE ONLY): 18 min   Charges:   PT Evaluation $PT Eval Low Complexity: 1 Low   PT General Charges $$ ACUTE PT VISIT: 1 Visit        Daymon Evans PT 07/12/2023  Acute Rehabilitation Services  Office [phone]

## 2023-07-12 NOTE — Plan of Care (Signed)
 Plan of Care Note for accepted transfer  Patient: Brandon Simmons.    OZH:[id number]  DOA: 07/11/2023     Facility requesting transfer: [facility]  Requesting Provider: No att. providers found  Reason for transfer: CAP, AMS  Facility course:   71 M with hx Afib on AC, DVT, prostate CA, asthma, p/w recent cough, AMS. Also with moderate hypoNa. Treated with Ceftriaxone , Azithromycin .   Plan of care: The patient is accepted for admission to Med-surg  unit, at [facility]     Author: Arnulfo Larch, MD  07/12/2023  Check [email or web address] for on-call coverage.  Nursing staff, Please call [facility] Admits & Consults System-Wide number on Amion as soon as patient's arrival, so appropriate admitting provider can evaluate the pt.

## 2023-07-12 NOTE — TOC Initial Note (Signed)
 Transition of Care (TOC) - Initial/Assessment Note   Patient Details  Name: Brandon Simmons. MRN: [id number] Date of Birth: 1945-11-14  Transition of [facility]) CM/SW Contact:    Zenon Hilda, LCSW Phone Number: 07/12/2023, 3:38 PM  Clinical Narrative: PT evaluation did not recommend any follow up, but did recommend a rollator. CSW spoke with wife, Newell Wafer, regarding walker. Wife agreeable to walker being delivered to patient's room and does not have a DME company preference. CSW made rollator referral to Zack with [employer], which was accepted. [employer] to deliver rollator to room.  Expected Discharge Plan: Home/Self Care Barriers to Discharge: Continued Medical Work up  Patient Goals and CMS Choice Patient states their goals for this hospitalization and ongoing recovery are:: Return home CMS Medicare.gov Compare Post Acute Care list provided to:: Patient Represenative (must comment) Choice offered to / list presented to : Spouse  Expected Discharge Plan and Services In-house Referral: Clinical Social Work Post Acute Care Choice: Durable Medical Equipment Living arrangements for the past 2 months: Single Family Home             DME Arranged: Walker rolling with seat DME Agency: [employer] Date DME Agency Contacted: 07/12/23 Time DME Agency Contacted: [phone] Representative spoke with at DME Agency: Zack  Prior Living Arrangements/Services Living arrangements for the past 2 months: Single Family Home Lives with:: Spouse Patient language and need for interpreter reviewed:: Yes Do you feel safe going back to the place where you live?: Yes      Need for Family Participation in Patient Care: No (Comment) Care giver support system in place?: Yes (comment) Criminal Activity/Legal Involvement Pertinent to Current Situation/Hospitalization: No - Comment as needed  Activities of Daily Living ADL Screening (condition at time of admission) Independently performs ADLs?: Yes  (appropriate for developmental age) Is the patient deaf or have difficulty hearing?: No Does the patient have difficulty seeing, even when wearing glasses/contacts?: No Does the patient have difficulty concentrating, remembering, or making decisions?: No  Permission Sought/Granted Permission sought to share information with : Other (comment) Permission granted to share information with : Yes, Verbal Permission Granted Permission granted to share info w AGENCY: DME company  Emotional Assessment Orientation: : Oriented to Self, Oriented to Place, Oriented to  Time, Oriented to Situation Alcohol  / Substance Use: Not Applicable Psych Involvement: No (comment)  Admission diagnosis:  Confusion [R41.0] Hyponatremia [E87.1] CAP (community acquired pneumonia) [J18.9] Pneumonia of both lungs due to infectious organism, unspecified part of lung [J18.9] Patient Active Problem List   Diagnosis Date Noted   CAP (community acquired pneumonia) 07/12/2023   Normocytic anemia 07/12/2023   Thrombocytopenia (HCC) 07/12/2023   Asthma, chronic 07/12/2023   Encounter for monitoring dofetilide  therapy 06/27/2023   Hypercoagulable state due to paroxysmal atrial fibrillation (HCC) 02/02/2023   Atypical atrial flutter (HCC) 02/02/2023   Community acquired pneumonia 11/27/2021   Hyponatremia 11/27/2021   Hypokalemia 11/27/2021   Acute metabolic encephalopathy 11/27/2021   Atrial fibrillation (HCC) 08/24/2018   Mild renal insufficiency 08/24/2018   Mild aortic regurgitation 08/24/2018   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 [place] [address] Phone: [phone] Fax:  [phone]  [facility] -  [place], [place] - [address] [place] [address] Phone: [phone] Fax: [phone]  [facility] [address] [place] [address] Phone: [phone] Fax: [phone]  [id number] - [place], [place] - [address] AT [address] OF [facility] [address] [place] [address] Phone: [phone] Fax: [phone]  Social Drivers of Health (SDOH) Social History: SDOH Screenings   Food Insecurity: No Food Insecurity (07/12/2023)  Housing: Low Risk  (07/12/2023)  Transportation Needs: No Transportation Needs (07/12/2023)  Utilities: Not At Risk (07/12/2023)  Social Connections: Socially Integrated (07/12/2023)  Tobacco Use: Medium Risk (07/11/2023)   SDOH Interventions:    Readmission Risk Interventions    11/29/2021   11:12 AM  Readmission Risk Prevention Plan  Transportation Screening Complete  PCP or Specialist Appt within 3-5 Days Complete  HRI or Home Care Consult Complete  Social Work Consult for Recovery Care Planning/Counseling Complete  Palliative Care Screening Not Applicable  Medication Review Oceanographer) Complete

## 2023-07-12 NOTE — H&P (Addendum)
 History and Physical    Brandon Simmons. MWN:[id number] DOB: 11-20-1945 DOA: 07/11/2023  PCP: Bertha Broad, MD  Patient coming from: [facility] ED  Chief Complaint: Fever  HPI: Brandon Simmons. is a 78 y.o. male with medical history significant of paroxysmal A-fib on Eliquis  and dofetilide , hypertension, asthma, history of prostate cancer status post prostatectomy and radiation therapy, history of DVT in 2019, GERD, hyperlipidemia presented to the ED for evaluation of fever, cough, and altered mental status/confusion.  Vital signs on arrival: Temperature 101.6 F, pulse 96, respiratory rate 18, blood pressure 132/66, SpO2 97% on room air.  Labs notable for WBC count 11.0, hemoglobin 12.1 (baseline 13-14), MCV 89.6, platelet count 131k (slightly low on previous labs as well), sodium 125 (previously 136 on labs 2 weeks ago), chloride 94, bicarb 21, glucose 157, creatinine 1.0, AST borderline elevated at 48 and remainder of LFTs normal, lactic acid normal, UA not suggestive of infection, lactic acid normal, blood cultures collected.  Chest x-ray showing streaky bibasilar density suspicious for atelectasis versus early infiltrates.  CT head negative for acute intracranial abnormality. Patient was given Tylenol , ceftriaxone , azithromycin , Robitussin, and 500 mL normal saline in the ED.  Patient is reporting 4-day history of severe cough, fevers, and generalized weakness.  Reporting temperature greater than 102 F at home.  Symptoms started after he was exposed to some people who were sick with respiratory symptoms at church this past weekend.  Reports very poor appetite for the past few days.  States he was seen by his PCP 2 days ago and tested negative for COVID and flu.  Denies vomiting, diarrhea, or abdominal pain.  Denies shortness of breath or chest pain.  He was having mild headaches at home, now resolved.  Denies any neck pain or stiffness.  Review of Systems:  Review of Systems  All  other systems reviewed and are negative.   Past Medical History:  Diagnosis Date   Aortic atherosclerosis (HCC)    Arthritis    Asthma    Diverticulitis    Diverticulosis    DVT (deep venous thrombosis) (HCC) 2019   right leg after back surgery   Dysrhythmia    A-fib every few weeks   Ectatic thoracic aorta (HCC)    a. 4.3cm by CT 03/2018.   GERD (gastroesophageal reflux disease)    Hemorrhoids    History of echocardiogram    a. Echo 10/16 [facility]):  EF 55%, trace MR, mild TR, mild to mod AI, mild dilated Ao root (41 mm)   Hyperlipemia    Organic erectile dysfunction    PAF (paroxysmal atrial fibrillation) (HCC)    a. admx to [facility] in [place], [place] 40/34 with AF with RVR >> converted to NSR with IV Dilt;  b. Flecainide  started >> ETT neg for pro-arrhythmia    Pneumonia 06/2020   Prostate cancer (HCC) 04/25/2006   Gleason 3+4=7   Prostatitis    S/P cardiac cath    a. LHC at [facility] 10/16:  LM ok, mLAD 30%, LCx ok, dRCA 20%   S/P radiation therapy 01/19/2014 through 03/05/2014                                                      Prostate bed 6600 cGy in 33 sessions  Torn rotator cuff    right  40% tear    Past Surgical History:  Procedure Laterality Date   ABLATION OF DYSRHYTHMIC FOCUS  03/28/2018   APPENDECTOMY  1967   ATRIAL FIBRILLATION ABLATION N/A 03/28/2018   Procedure: ATRIAL FIBRILLATION ABLATION;  Surgeon: Jolly Needle, MD;  Location: [facility] INVASIVE CV LAB;  Service: Cardiovascular;  Laterality: N/A;   ATRIAL FIBRILLATION ABLATION N/A 11/11/2019   Procedure: ATRIAL FIBRILLATION ABLATION;  Surgeon: Jolly Needle, MD;  Location: [facility] INVASIVE CV LAB;  Service: Cardiovascular;  Laterality: N/A;   ATRIAL FIBRILLATION ABLATION N/A 12/14/2021   Procedure: ATRIAL FIBRILLATION ABLATION;  Surgeon: Lei Pump, MD;  Location: [facility] INVASIVE CV LAB;  Service: Cardiovascular;  Laterality: N/A;   BACK SURGERY  2019   CARDIAC  CATHETERIZATION  2016   [place]   CATARACT EXTRACTION W/ INTRAOCULAR LENS IMPLANT Left 11/2011   CATARACT EXTRACTION W/ INTRAOCULAR LENS IMPLANT Right 09/2015   COLONOSCOPY     LARYNX SURGERY  2000   vocal cord lesion- benign   LUMBAR LAMINECTOMY/DECOMPRESSION MICRODISCECTOMY Right 07/26/2017   Procedure: RIGHT LUMBAR 3- LUMBAR 4 FORAMINOTOMY WITH RESECTION OF SYNOVIAL CYST;  Surgeon: Manya Sells, MD;  Location: [facility] OR;  Service: Neurosurgery;  Laterality: Right;  RIGHT LUMBAR 3- LUMBAR 4 FORAMINOTOMY WITH RESECTION OF SYNOVIAL CYST   MENISCUS REPAIR Right    POLYPECTOMY     PROSTATE BIOPSY  1999   PROSTATECTOMY  04/25/2006   REVERSE SHOULDER ARTHROPLASTY Left 09/02/2020   Procedure: REVERSE SHOULDER ARTHROPLASTY;  Surgeon: Ellard Gunning, MD;  Location: [facility] ORS;  Service: Orthopedics;  Laterality: Left;      reports that he quit smoking about 53 years ago. His smoking use included cigarettes. He started smoking about 63 years ago. He has a 10 pack-year smoking history. He has never used smokeless tobacco. He reports that he does not currently use alcohol . He reports that he does not use drugs.  No Known Allergies  Family History  Problem Relation Age of Onset   Emphysema Father    Lung cancer Father    Hypertension Mother    Heart disease Mother        heart attack   Arthritis Mother    Diabetes Maternal Grandmother    Heart disease Maternal Grandfather        Died age 41   Liver cancer Paternal Grandmother    Heart disease Paternal Grandfather        Died age 55   Atrial fibrillation Son    Stomach cancer Neg Hx    Rectal cancer Neg Hx    Esophageal cancer Neg Hx    Colon cancer Neg Hx     Prior to Admission medications   Medication Sig Start Date End Date Taking? Authorizing Provider  acetaminophen  (TYLENOL ) 500 MG tablet Take 500 mg by mouth at bedtime.   Yes [provider]  apixaban  (ELIQUIS ) 5 MG TABS tablet Take 5 mg by mouth 2 (two) times daily.     Yes [provider]  Ascorbic Acid  (VITAMIN C  PO) Take by mouth. Taking 1 chewable by mouth daily- 500mg    Yes [provider]  atorvastatin  (LIPITOR) 20 MG tablet Take 20 mg by mouth daily.   Yes [provider]  Azelastine  HCl 137 MCG/SPRAY SOLN Place 1 spray into both nostrils 2 (two) times daily.   Yes [provider]  diltiazem  (CARDIZEM ) 30 MG tablet Take 1 tablet every 4 hours AS NEEDED for afib rapid  heart rate over 100 02/02/23  Yes Fenton, Clint R, PA  dofetilide  (TIKOSYN ) 500 MCG capsule Take 1 capsule (500 mcg total) by mouth 2 (two) times daily. 02/14/23  Yes Fenton, Clint R, PA  fexofenadine (ALLEGRA ALLERGY) 180 MG tablet Take 1 tablet by mouth daily. 08/17/21  Yes [provider]  fluticasone  (FLONASE ) 50 MCG/ACT nasal spray Place 1 spray into both nostrils every evening. 08/19/21  Yes [provider]  GLUCOSAMINE-CHONDROITIN PO Take 1,500 mg by mouth daily.   Yes [provider]  ipratropium (ATROVENT ) 0.06 % nasal spray Place 2 sprays into both nostrils at bedtime. 10/09/22  Yes [provider]  losartan  (COZAAR ) 50 MG tablet Take 25 mg by mouth daily. 10/09/22  Yes [provider]  Mometasone  Furoate (ASMANEX  HFA) 100 MCG/ACT AERO Inhale 1 puff into the lungs every evening.   Yes [provider]  montelukast  (SINGULAIR ) 10 MG tablet Take 10 mg by mouth at bedtime.   Yes [provider]  Multiple Vitamin (MULTIVITAMIN ADULT PO) Take by mouth. L-Lycine- Taking 1 tablet by mouth daily - 500mg    Yes [provider]  Naphazoline-Pheniramine (OPCON-A ) 0.027-0.315 % SOLN Place 1 drop into both eyes as needed (eye allergies (red/irritated eyes)).   Yes [provider]  omeprazole (PRILOSEC) 20 MG capsule Take 20 mg by mouth 2 (two) times daily before a meal.    Yes [provider]  potassium chloride  (KLOR-CON  M10) 10 MEQ tablet Take 3 tablets by mouth daily 04/30/23  Yes  Fenton, Clint R, PA  Probiotic Product (ALIGN) 4 MG CAPS Take 4 mg by mouth every evening.   Yes [provider]  psyllium (METAMUCIL) 58.6 % powder Taking 2 tsp twice daily   Yes [provider]  VITAMIN D  PO Take 1,000 Units by mouth daily. Taking 1 gel capsule daily   Yes [provider]  EPINEPHrine  0.3 mg/0.3 mL IJ SOAJ injection Inject 0.3 mg into the muscle as needed for anaphylaxis. 04/06/19   [provider]  VENTOLIN  HFA 108 (90 Base) MCG/ACT inhaler Inhale 1-2 puffs into the lungs every 6 (six) hours as needed for wheezing or shortness of breath. 12/30/18   [provider]    Physical Exam: Vitals:   07/12/23 0150 07/12/23 0152 07/12/23 0200 07/12/23 0237  BP:   102/72 (!) 114/97  Pulse: 75  70 75  Resp: 17  14 16   Temp:  98.9 F (37.2 C)  99.7 F (37.6 C)  TempSrc:  Oral    SpO2: 100%  100% 100%  Weight:   97.2 kg   Height:   5\' 10"  (1.778 m)     Physical Exam Vitals reviewed.  Constitutional:      General: He is not in acute distress. HENT:     Head: Normocephalic and atraumatic.  Eyes:     Extraocular Movements: Extraocular movements intact.  Cardiovascular:     Rate and Rhythm: Normal rate and regular rhythm.     Pulses: Normal pulses.  Pulmonary:     Effort: Pulmonary effort is normal. No respiratory distress.     Breath sounds: No wheezing or rhonchi.  Abdominal:     General: Bowel sounds are normal. There is no distension.     Palpations: Abdomen is soft.     Tenderness: There is no abdominal tenderness. There is no guarding.  Musculoskeletal:     Cervical back: Normal range of motion. No rigidity.     Right lower leg: No  edema.     Left lower leg: No edema.  Skin:    General: Skin is warm and dry.  Neurological:     General: No focal deficit present.     Mental Status: He is alert and oriented to person, place, and time.     Cranial Nerves: No cranial nerve deficit.     Sensory: No sensory deficit.      Motor: No weakness.     Labs on Admission: I have personally reviewed following labs and imaging studies  CBC: Recent Labs  Lab 07/11/23 2150  WBC 11.0*  NEUTROABS 8.3*  HGB 12.1*  HCT 34.3*  MCV 89.6  PLT 131*   Basic Metabolic Panel: Recent Labs  Lab 07/11/23 2150  NA 125*  K 3.6  CL 94*  CO2 21*  GLUCOSE 157*  BUN 8  CREATININE 1.02  CALCIUM  8.8*   GFR: Estimated Creatinine Clearance: 69.8 mL/min (by C-G formula based on SCr of 1.02 mg/dL). Liver Function Tests: Recent Labs  Lab 07/11/23 2150  AST 48*  ALT 24  ALKPHOS 66  BILITOT 0.9  PROT 6.1*  ALBUMIN 3.8   No results for input(s): "LIPASE", "AMYLASE" in the last 168 hours. No results for input(s): "AMMONIA" in the last 168 hours. Coagulation Profile: Recent Labs  Lab 07/11/23 2150  INR 1.2   Cardiac Enzymes: No results for input(s): "CKTOTAL", "CKMB", "CKMBINDEX", "TROPONINI" in the last 168 hours. BNP (last 3 results) No results for input(s): "PROBNP" in the last 8760 hours. HbA1C: No results for input(s): "HGBA1C" in the last 72 hours. CBG: No results for input(s): "GLUCAP" in the last 168 hours. Lipid Profile: No results for input(s): "CHOL", "HDL", "LDLCALC", "TRIG", "CHOLHDL", "LDLDIRECT" in the last 72 hours. Thyroid  Function Tests: No results for input(s): "TSH", "T4TOTAL", "FREET4", "T3FREE", "THYROIDAB" in the last 72 hours. Anemia Panel: No results for input(s): "VITAMINB12", "FOLATE", "FERRITIN", "TIBC", "IRON", "RETICCTPCT" in the last 72 hours. Urine analysis:    Component Value Date/Time   COLORURINE YELLOW 07/11/2023 2150   APPEARANCEUR CLEAR 07/11/2023 2150   LABSPEC 1.009 07/11/2023 2150   PHURINE 5.5 07/11/2023 2150   GLUCOSEU NEGATIVE 07/11/2023 2150   HGBUR TRACE (A) 07/11/2023 2150   BILIRUBINUR NEGATIVE 07/11/2023 2150   KETONESUR NEGATIVE 07/11/2023 2150   PROTEINUR NEGATIVE 07/11/2023 2150   NITRITE NEGATIVE 07/11/2023 2150   LEUKOCYTESUR NEGATIVE 07/11/2023  2150    Radiological Exams on Admission: CT Head Wo Contrast Result Date: 07/11/2023 CLINICAL DATA:  Altered mental status EXAM: CT HEAD WITHOUT CONTRAST TECHNIQUE: Contiguous axial images were obtained from the base of the skull through the vertex without intravenous contrast. RADIATION DOSE REDUCTION: This exam was performed according to the departmental dose-optimization program which includes automated exposure control, adjustment of the mA and/or kV according to patient size and/or use of iterative reconstruction technique. COMPARISON:  None Available. FINDINGS: Brain: No mass,hemorrhage or extra-axial collection. Normal appearance of the parenchyma and CSF spaces. Vascular: No hyperdense vessel or unexpected vascular calcification. Skull: The visualized skull base, calvarium and extracranial soft tissues are normal. Sinuses/Orbits: No fluid levels or advanced mucosal thickening of the visualized paranasal sinuses. No mastoid or middle ear effusion. Normal orbits. Other: None. IMPRESSION: Normal head CT. Electronically Signed   By: Juanetta Nordmann M.D.   On: 07/11/2023 23:35   DG Chest Port 1 View Result Date: 07/11/2023 CLINICAL DATA:  Sepsis. EXAM: PORTABLE CHEST 1 VIEW COMPARISON:  01/19/2023 FINDINGS: The cardiac silhouette, mediastinal and hilar contours are within normal limits.  Streaky bibasilar density could be atelectasis or early infiltrates. No pleural effusions. No pulmonary lesions. The bony thorax is intact. Stable left shoulder arthroplasty. IMPRESSION: Streaky bibasilar density could be atelectasis or early infiltrates. Electronically Signed   By: Marrian Siva M.D.   On: 07/11/2023 22:01    EKG: Independently reviewed.  Sinus or ectopic atrial rhythm, QTc 426.  Assessment and Plan  Community-acquired pneumonia Patient is presenting with fever, cough, and borderline leukocytosis. Chest x-ray showing streaky bibasilar density suspicious for atelectasis versus early infiltrates.  Not  hypoxic.  Lactate normal and no signs of sepsis at this time.  Continue antibiotic coverage with ceftriaxone  and doxycycline .  Avoid macrolides due to risk of QT prolongation given dofetilide  use.  Tylenol  as needed for fevers, guaifenesin -codeine  solution as needed for severe cough.  Follow-up blood cultures and monitor WBC count.  COVID/influenza/RSV PCR.  Strep pneumo and Legionella urinary antigens.  Sputum Gram stain and culture.  MRSA PCR screen.  Hyponatremia Likely due to very poor p.o. intake for the past few days.  Sodium 125 (previously 136 on labs 2 weeks ago).  Continue IV fluid hydration with normal saline and repeat labs ordered to check sodium level.  Check serum osmolarity.  Goal rate of correction 4 to 6 mEq in 24 hours.  Acute metabolic encephalopathy Likely due to pneumonia and hyponatremia.  CT head negative for acute intracranial abnormality and no focal neurodeficit.  No nuchal rigidity to suggest meningitis.  UA not suggestive of infection.  Patient is currently AAO x 4 and answering questions appropriately.  Normocytic anemia Hemoglobin 12.1 (baseline 13-14).  No signs or symptoms of bleeding.  Continue to monitor CBC.  Chronic mild thrombocytopenia Continue to monitor CBC.  Paroxysmal A-fib Continue Eliquis  5 mg twice daily and dofetilide  500 mcg twice daily.  Recent cardiology note reviewed to confirm dose.  Patient is no longer on scheduled diltiazem  due to history of bradycardia.  Asthma Stable, no wheezing.  Albuterol  neb PRN.  Hypertension GERD Hyperlipidemia Continue remainder of home medications after pharmacy med rec is done.  DVT prophylaxis: Eliquis  Code Status: Full Code (discussed with the patient) Family Communication: No family available at this time. Level of care: Telemetry bed Admission status: It is my clinical opinion that referral for OBSERVATION is reasonable and necessary in this patient based on the above information provided. The  aforementioned taken together are felt to place the patient at high risk for further clinical deterioration. However, it is anticipated that the patient may be medically stable for discharge from the hospital within 24 to 48 hours.  Juliette Oh MD [facility]  If 7PM-7AM, please contact night-coverage [email or web address]  07/12/2023, 4:05 AM

## 2023-07-12 NOTE — Plan of Care (Signed)

## 2023-07-12 NOTE — ED Notes (Signed)
-  Gave patient graham/saltine crackers and Ginger-Ale per EDP approval.

## 2023-07-12 NOTE — Hospital Course (Addendum)
 Brandon Simmons. is a 78 y.o. male with a history of proximal atrial fibrillation, hypertension, asthma, prostate cancer status post prostatectomy and radiation therapy, DVT, GERD, hyperlipidemia.  Patient presented secondary to fever, cough, and altered mental status/confusion with evidence of community-acquired pneumonia.  Empiric antibiotics started with Ceftriaxone  and doxycycline .  Blood cultures ordered and are no growth. Hospitalization complicated by persistent hyponatremia and worsening pneumonia symptoms. Pulmonology consulted. CT imaging confirmed multifocal pneumonia. Nephrology consulted for assistance with hyponatremia which improved initially with IV fluids, followed by Lasix .

## 2023-07-13 DIAGNOSIS — E871 Hypo-osmolality and hyponatremia: Secondary | ICD-10-CM

## 2023-07-13 DIAGNOSIS — I480 Paroxysmal atrial fibrillation: Secondary | ICD-10-CM | POA: Diagnosis not present

## 2023-07-13 DIAGNOSIS — J189 Pneumonia, unspecified organism: Secondary | ICD-10-CM | POA: Diagnosis not present

## 2023-07-13 LAB — COMPREHENSIVE METABOLIC PANEL WITH GFR
ALT: 29 U/L (ref 0–44)
AST: 60 U/L — ABNORMAL HIGH (ref 15–41)
Albumin: 2.8 g/dL — ABNORMAL LOW (ref 3.5–5.0)
Alkaline Phosphatase: 50 U/L (ref 38–126)
Anion gap: 9 (ref 5–15)
BUN: 10 mg/dL (ref 8–23)
CO2: 20 mmol/L — ABNORMAL LOW (ref 22–32)
Calcium: 7.6 mg/dL — ABNORMAL LOW (ref 8.9–10.3)
Chloride: 95 mmol/L — ABNORMAL LOW (ref 98–111)
Creatinine, Ser: 0.95 mg/dL (ref 0.61–1.24)
GFR, Estimated: >60 mL/min (ref >60)
Glucose, Bld: 114 mg/dL — ABNORMAL HIGH (ref 70–99)
Potassium: 3.8 mmol/L (ref 3.5–5.1)
Sodium: 124 mmol/L — ABNORMAL LOW (ref 135–145)
Total Bilirubin: 0.9 mg/dL (ref 0.0–1.2)
Total Protein: 5.5 g/dL — ABNORMAL LOW (ref 6.5–8.1)

## 2023-07-13 LAB — OSMOLALITY, URINE: Osmolality, Ur: 176 mosm/kg — ABNORMAL LOW (ref 300–900)

## 2023-07-13 LAB — CBC
HCT: 37.1 % — ABNORMAL LOW (ref 39.0–52.0)
Hemoglobin: 13.1 g/dL (ref 13.0–17.0)
MCH: 32.3 pg (ref 26.0–34.0)
MCHC: 35.3 g/dL (ref 30.0–36.0)
MCV: 91.6 fL (ref 80.0–100.0)
Platelets: 116 10*3/uL — ABNORMAL LOW (ref 150–400)
RBC: 4.05 MIL/uL — ABNORMAL LOW (ref 4.22–5.81)
RDW: 11.8 % (ref 11.5–15.5)
WBC: 10.4 10*3/uL (ref 4.0–10.5)
nRBC: 0 % (ref 0.0–0.2)

## 2023-07-13 LAB — SODIUM
Sodium: 124 mmol/L — ABNORMAL LOW (ref 135–145)
Sodium: 124 mmol/L — ABNORMAL LOW (ref 135–145)

## 2023-07-13 LAB — MAGNESIUM: Magnesium: 1.8 mg/dL (ref 1.7–2.4)

## 2023-07-13 LAB — SODIUM, URINE, RANDOM: Sodium, Ur: <10 mmol/L

## 2023-07-13 MED ORDER — POTASSIUM CHLORIDE CRYS ER 20 MEQ PO TBCR
40.0000 meq | EXTENDED_RELEASE_TABLET | Freq: Once | ORAL | Status: AC
  Administered 2023-07-13: 40 meq via ORAL
  Filled 2023-07-13: qty 2

## 2023-07-13 MED ORDER — LORATADINE 10 MG PO TABS
10.0000 mg | ORAL_TABLET | Freq: Every day | ORAL | Status: DC
Start: 2023-07-13 — End: 2023-07-17
  Administered 2023-07-13 – 2023-07-17 (×5): 10 mg via ORAL
  Filled 2023-07-13 (×5): qty 1

## 2023-07-13 MED ORDER — SODIUM CHLORIDE 1 G PO TABS
1.0000 g | ORAL_TABLET | Freq: Three times a day (TID) | ORAL | Status: DC
  Administered 2023-07-13 – 2023-07-17 (×12): 1 g via ORAL
  Filled 2023-07-13 (×13): qty 1

## 2023-07-13 MED ORDER — MAGNESIUM SULFATE IN D5W 1-5 GM/100ML-% IV SOLN
1.0000 g | Freq: Once | INTRAVENOUS | Status: AC
Start: 2023-07-13 — End: 2023-07-13
  Administered 2023-07-13: 1 g via INTRAVENOUS
  Filled 2023-07-13: qty 100

## 2023-07-13 MED ORDER — PSYLLIUM 95 % PO PACK
1.0000 | PACK | Freq: Every day | ORAL | Status: DC
  Administered 2023-07-13 – 2023-07-15 (×3): 1 via ORAL
  Filled 2023-07-13 (×5): qty 1

## 2023-07-13 NOTE — Progress Notes (Signed)
 PT Cancellation Note  Patient Details Name: Brandon Simmons. MRN: [id number] DOB: 12-Mar-1946   Cancelled Treatment:     Pt declined "late in the day".  Stated he did walk earlier today.  Pt has been evaluated with no post acute PT rec.  Pt plans to D/C to home when medically ready.    Bess Broody  PTA Acute  Rehabilitation Services Office M-F          [phone]

## 2023-07-13 NOTE — Progress Notes (Signed)
 Received report on pt, pt stable condition. During hand off rounding, noticed pt coughing and unable to get comfortable. Offered pt various meds, agreeable to taking a breathing treatment and called for additional med (Claritin ) give for patient. SRP, RN

## 2023-07-13 NOTE — Progress Notes (Signed)
 PROGRESS NOTE    Olan Ammie Kallman.  ZOX:[id number] DOB: [birth year] DOA: 07/11/2023 PCP: Bertha Broad, MD   Brief Narrative: Brandon Simmons. is a 78 y.o. male with a history of proximal atrial fibrillation, hypertension, asthma, prostate cancer status post prostatectomy and radiation therapy, DVT, GERD, hyperlipidemia.  Patient presented secondary to fever, cough, and altered mental status/confusion with evidence of community-acquired pneumonia.  Empiric antibiotics started.  Blood cultures ordered and are pending.   Assessment and Plan:  Community-acquired pneumonia Patient with fever and leukocytosis. Chest x-ray with possible bibasilar early infiltrates. Blood cultures obtained. Empiric Ceftriaxone  and azithromycin  started. Sputum culture significant for GNR. Febrile overnight. -Continue Azithromycin  and Ceftriaxone  -Follow-up blood cultures  Hyponatremia Sodium of 125 on admission. Unclear etiology but presumed secondary to hypovolemia. Serum osmolality of 270. Urine sodium undetectably low with urine osmolality of 421. Managed initially with IV fluids with stagnant sodium. Discussed with nephrology who recommended to stop IV fluids and start salt tablets for possible volume depletion SAIDH combination. -Salt tablets 1 g TID -Await repeat urine sodium/osmolality  Hypokalemia -Potassium supplementation to keep potassium >4  Acute metabolic encephalopathy Presumed secondary to pneumonia. Hyponatremia likely not bad enough to contribute. CT head negative for acute process.  Normocytic anemia Mild. Resolved.  Chronic thrombocytopenia Unclear etiology. Platelets of 131 on admission, down to 117 today. Likely reactive related to acute illness.  Paroxysmal atrial fibrillation Patient is on Eliquis  and dofetilide  as an outpatient. From last cardiology note, diltiazem  180 mg daily discontinued secondary to bradycardia. -Continue Eliquis  -Continue  dofetilide  -Keep potassium greater than 4 and magnesium  greater than 2  Asthma Stable.  Primary hypertension Patient is on losartan  and diltiazem  as an outpatient.  GERD Patient is on omeprazole as an outpatient. - Protonix  while inpatient  Hyperlipidemia -Continue Lipitor   DVT prophylaxis: Eliquis  Code Status:   Code Status: Full Code Family Communication: Wife at bedside Disposition Plan: Discharge home likely in 2-3 days pending improved sodium and if remaining afebrile.   Consultants:  None  Procedures:  None  Antimicrobials: Ceftriaxone  Doxycycline     Subjective: Cough is better. Febrile overnight.   Objective: BP 123/68 (BP Location: Left Arm)   Pulse 65   Temp 99.2 F (37.3 C) (Oral)   Resp 20   Ht 5\' 10"  (1.778 m)   Wt 97.2 kg   SpO2 97%   BMI 30.75 kg/m   Examination:  General exam: Appears calm and comfortable Respiratory system: Clear to auscultation on left with right lower diminished breath sounds. Respiratory effort normal. Cardiovascular system: S1 & S2 heard, RRR. No murmurs, rubs, gallops or clicks. Gastrointestinal system: Abdomen is nondistended, soft and nontender. Normal bowel sounds heard. Central nervous system: Alert and oriented. No focal neurological deficits. Musculoskeletal: No edema. No calf tenderness Psychiatry: Judgement and insight appear normal. Mood & affect appropriate.    Data Reviewed: I have personally reviewed following labs and imaging studies  CBC Lab Results  Component Value Date   WBC 10.4 07/13/2023   RBC 4.05 (L) 07/13/2023   HGB 13.1 07/13/2023   HCT 37.1 (L) 07/13/2023   MCV 91.6 07/13/2023   MCH 32.3 07/13/2023   PLT 116 (L) 07/13/2023   MCHC 35.3 07/13/2023   RDW 11.8 07/13/2023   LYMPHSABS 1.3 07/11/2023   MONOABS 1.3 (H) 07/11/2023   EOSABS 0.0 07/11/2023   BASOSABS 0.1 07/11/2023     Last metabolic panel Lab Results  Component Value Date   NA 124 (L) 07/13/2023  K 3.8 07/13/2023    CL 95 (L) 07/13/2023   CO2 20 (L) 07/13/2023   BUN 10 07/13/2023   CREATININE 0.95 07/13/2023   GLUCOSE 114 (H) 07/13/2023   GFRNONAA >60 07/13/2023   GFRAA 74 11/03/2019   CALCIUM  7.6 (L) 07/13/2023   PHOS 2.8 11/29/2021   PROT 5.5 (L) 07/13/2023   ALBUMIN 2.8 (L) 07/13/2023   BILITOT 0.9 07/13/2023   ALKPHOS 50 07/13/2023   AST 60 (H) 07/13/2023   ALT 29 07/13/2023   ANIONGAP 9 07/13/2023    GFR: Estimated Creatinine Clearance: 75 mL/min (by C-G formula based on SCr of 0.95 mg/dL).  Recent Results (from the past 240 hours)  Culture, blood (Routine x 2)     Status: None (Preliminary result)   Collection Time: 07/11/23  9:51 PM   Specimen: BLOOD  Result Value Ref Range Status   Specimen Description   Final    BLOOD RIGHT ANTECUBITAL Performed at [facility], [address], [place], [place] [address]    Special Requests   Final    BOTTLES DRAWN AEROBIC AND ANAEROBIC Blood Culture adequate volume Performed at [facility], [address] [place], [place] [address]    Culture   Final    NO GROWTH < 24 HOURS Performed at [facility], [address] [place], [place] [address]    Report Status PENDING  Incomplete  Culture, blood (Routine x 2)     Status: None (Preliminary result)   Collection Time: 07/11/23  9:51 PM   Specimen: BLOOD RIGHT FOREARM  Result Value Ref Range Status   Specimen Description   Final    BLOOD RIGHT FOREARM Performed at [facility], [address], [place], [place] [address]    Special Requests   Final    BOTTLES DRAWN AEROBIC AND ANAEROBIC Blood Culture adequate volume Performed at [facility], [address] [place], [place] [address]    Culture   Final    NO GROWTH < 24 HOURS Performed at [facility], [address], [place], [place] [address]    Report Status PENDING  Incomplete  MRSA Next Gen by PCR, Nasal     Status: None   Collection  Time: 07/12/23  6:03 AM   Specimen: Anterior Nasal Swab  Result Value Ref Range Status   MRSA by PCR Next Gen NOT DETECTED NOT DETECTED Final    Comment: (NOTE) The GeneXpert MRSA Assay (FDA approved for NASAL specimens only), is one component of a comprehensive MRSA colonization surveillance program. It is not intended to diagnose MRSA infection nor to guide or monitor treatment for MRSA infections. Test performance is not FDA approved in patients less than 54 years old. Performed at [facility], [address] [place], [place] [address]   Resp panel by RT-PCR (RSV, Flu A&B, Covid) Anterior Nasal Swab     Status: None   Collection Time: 07/12/23  6:04 AM   Specimen: Anterior Nasal Swab  Result Value Ref Range Status   SARS Coronavirus 2 by RT PCR NEGATIVE NEGATIVE Final    Comment: (NOTE) SARS-CoV-2 target nucleic acids are NOT DETECTED.  The SARS-CoV-2 RNA is generally detectable in upper respiratory specimens during the acute phase of infection. The lowest concentration of SARS-CoV-2 viral copies this assay can detect is 138 copies/mL. A negative result does not preclude SARS-Cov-2 infection and should not be used as the sole basis for treatment or other patient management decisions.  A negative result may occur with  improper specimen collection/handling, submission of specimen other than nasopharyngeal swab, presence of viral mutation(s) within the areas targeted by this assay, and inadequate number of viral copies(<138 copies/mL). A negative result must be combined with clinical observations, patient history, and epidemiological information. The expected result is Negative.  Fact Sheet for Patients:  BloggerCourse.com  Fact Sheet for Healthcare Providers:  SeriousBroker.it  This test is no t yet approved or cleared by the [place]  FDA and  has been authorized for detection and/or diagnosis of  SARS-CoV-2 by FDA under an Emergency Use Authorization (EUA). This EUA will remain  in effect (meaning this test can be used) for the duration of the COVID-19 declaration under Section 564(b)(1) of the Act, 21 U.S.C.section 360bbb-3(b)(1), unless the authorization is terminated  or revoked sooner.       Influenza A by PCR NEGATIVE NEGATIVE Final   Influenza B by PCR NEGATIVE NEGATIVE Final    Comment: (NOTE) The Xpert Xpress SARS-CoV-2/FLU/RSV plus assay is intended as an aid in the diagnosis of influenza from Nasopharyngeal swab specimens and should not be used as a sole basis for treatment. Nasal washings and aspirates are unacceptable for Xpert Xpress SARS-CoV-2/FLU/RSV testing.  Fact Sheet for Patients: BloggerCourse.com  Fact Sheet for Healthcare Providers: SeriousBroker.it  This test is not yet approved or cleared by the [place]  FDA and has been authorized for detection and/or diagnosis of SARS-CoV-2 by FDA under an Emergency Use Authorization (EUA). This EUA will remain in effect (meaning this test can be used) for the duration of the COVID-19 declaration under Section 564(b)(1) of the Act, 21 U.S.C. section 360bbb-3(b)(1), unless the authorization is terminated or revoked.     Resp Syncytial Virus by PCR NEGATIVE NEGATIVE Final    Comment: (NOTE) Fact Sheet for Patients: BloggerCourse.com  Fact Sheet for Healthcare Providers: SeriousBroker.it  This test is not yet approved or cleared by the [place]  FDA and has been authorized for detection and/or diagnosis of SARS-CoV-2 by FDA under an Emergency Use Authorization (EUA). This EUA will remain in effect (meaning this test can be used) for the duration of the COVID-19 declaration under Section 564(b)(1) of the Act, 21 U.S.C. section 360bbb-3(b)(1), unless the authorization is terminated  or revoked.  Performed at [facility], [address], [place], [place] [address]   Expectorated Sputum Assessment w Gram Stain, Rflx to Resp Cult     Status: None   Collection Time: 07/12/23 11:51 AM   Specimen: Sputum  Result Value Ref Range Status   Specimen Description SPUTUM  Final   Special Requests NONE  Final   Sputum evaluation   Final    THIS SPECIMEN IS ACCEPTABLE FOR SPUTUM CULTURE Performed at [facility], [address] [place], [place] [address]    Report Status 07/12/2023 FINAL  Final  Culture, Respiratory w Gram Stain     Status: None (Preliminary result)   Collection Time: 07/12/23 11:51 AM   Specimen: SPU  Result Value Ref Range Status   Specimen Description   Final    SPUTUM Performed at [facility], [address] [place], [place] [address]    Special Requests   Final    NONE Reflexed from [id number] Performed at [facility], [address], [place], [place] [address]    Gram Stain   Final    ABUNDANT WBC PRESENT, PREDOMINANTLY PMN RARE GRAM NEGATIVE RODS Performed at [facility], [address] [place], [place] [address]    Culture PENDING  Incomplete   Report Status PENDING  Incomplete      Radiology Studies: CT Head Wo Contrast Result Date: 07/11/2023 CLINICAL DATA:  Altered mental status EXAM: CT HEAD WITHOUT CONTRAST TECHNIQUE: Contiguous axial images were obtained from the base of the skull through the vertex without intravenous contrast. RADIATION DOSE REDUCTION: This exam was performed according to the departmental dose-optimization program which includes automated exposure control, adjustment of the mA and/or kV according to patient size and/or use of iterative reconstruction technique. COMPARISON:  None Available. FINDINGS: Brain: No mass,hemorrhage or extra-axial collection. Normal appearance of the parenchyma and CSF spaces. Vascular: No hyperdense vessel or  unexpected vascular calcification. Skull: The visualized skull base, calvarium and extracranial soft tissues are normal. Sinuses/Orbits: No fluid levels or advanced mucosal thickening of the visualized paranasal sinuses. No mastoid or middle ear effusion. Normal orbits. Other: None. IMPRESSION: Normal head CT. Electronically Signed   By: Juanetta Nordmann M.D.   On: 07/11/2023 23:35   DG Chest Port 1 View Result Date: 07/11/2023 CLINICAL DATA:  Sepsis. EXAM: PORTABLE CHEST 1 VIEW COMPARISON:  01/19/2023 FINDINGS: The cardiac silhouette, mediastinal and hilar contours are within normal limits. Streaky bibasilar density could be atelectasis or early infiltrates. No pleural effusions. No pulmonary lesions. The bony thorax is intact. Stable left shoulder arthroplasty. IMPRESSION: Streaky bibasilar density could be atelectasis or early infiltrates. Electronically Signed   By: Marrian Siva M.D.   On: 07/11/2023 22:01      LOS: 1 day    Aneita Keens, MD [facility] 07/13/2023, 9:00 AM   If 7PM-7AM, please contact night-coverage [email or web address]

## 2023-07-13 NOTE — Evaluation (Signed)
 Occupational Therapy Evaluation Patient Details Name: Brandon Simmons. MRN: [id number] DOB: 03/26/1945 Today's Date: 07/13/2023   History of Present Illness   79 y.o. male presented secondary to fever, cough, and altered mental status/confusion with evidence of community-acquired pneumonia.with a history of proximal atrial fibrillation, hypertension, asthma, prostate cancer status post prostatectomy and radiation therapy, DVT, GERD, hyperlipidemia.     Clinical Impressions Patient evaluated by Occupational Therapy with no further acute OT needs identified. All education has been completed and the patient has no further questions.  Educated and trained in energy conservation techniques and integration of new rollator for A/IADL's with + teach back by patient and wife. Encouraged OOB and trained in recliner safety following set up. See below for any follow-up Occupational Therapy or equipment needs. OT is signing off. Thank you for this referral.      If plan is discharge home, recommend the following:   Assistance with cooking/housework;Assist for transportation     Functional Status Assessment   Patient has not had a recent decline in their functional status     Equipment Recommendations   None recommended by OT      Precautions/Restrictions   Precautions Precautions: None Restrictions Weight Bearing Restrictions Per Provider Order: No     Mobility Bed Mobility Overal bed mobility: Modified Independent             General bed mobility comments: HOB up, used rail    Transfers Overall transfer level: Needs assistance Equipment used: Rollator (4 wheels) Transfers: Sit to/from Stand, Bed to chair/wheelchair/BSC Sit to Stand: Supervision     Step pivot transfers: Supervision     General transfer comment: out of bed to recliner OT set up as patient had no recliner in room and encouraged by team for OOB      Balance Overall balance assessment:  Modified Independent                                         ADL either performed or assessed with clinical judgement   ADL Overall ADL's : Modified independent                                       General ADL Comments: reports fatigue thus OT educated patient and wife in Energy Conservation techniques and rollator integration and safety as patient now has rollator delivered in room, + teach back post education     Vision Baseline Vision/History: 0 No visual deficits;1 Wears glasses Ability to See in Adequate Light: 0 Adequate Patient Visual Report: No change from baseline Vision Assessment?: No apparent visual deficits;Wears glasses for reading     Perception Perception: Within Functional Limits       Praxis Praxis: [facility]       Pertinent Vitals/Pain Pain Assessment Pain Assessment: No/denies pain     Extremity/Trunk Assessment Upper Extremity Assessment Upper Extremity Assessment: Overall WFL for tasks assessed   Lower Extremity Assessment Lower Extremity Assessment: Overall WFL for tasks assessed   Cervical / Trunk Assessment Cervical / Trunk Assessment: Normal   Communication Communication Communication: No apparent difficulties   Cognition Arousal: Alert Behavior During Therapy: WFL for tasks assessed/performed Cognition: No apparent impairments  Following commands: Intact       Cueing  General Comments    No issues   no SOB noted this visit           Home Living Family/patient expects to be discharged to:: Private residence Living Arrangements: Spouse/significant other Available Help at Discharge: Family;Available 24 hours/day Type of Home: House Home Access: Level entry     Home Layout: One level     Bathroom Shower/Tub: Producer, television/film/video: Handicapped height Bathroom Accessibility: Yes How Accessible: Accessible via wheelchair;Accessible via  walker Home Equipment: Rollator (4 wheels);Grab bars - tub/shower   Additional Comments: Lives at [facility], Independent living and now rollator delievered in room      Prior Functioning/Environment Prior Level of Function : Independent/Modified Independent             Mobility Comments: walks without AD, denies falls in past 6 months, goes to exercise classes at [employer] 3-4x/week       AM-PAC OT "6 Clicks" Daily Activity     Outcome Measure Help from another person eating meals?: None Help from another person taking care of personal grooming?: None Help from another person toileting, which includes using toliet, bedpan, or urinal?: None Help from another person bathing (including washing, rinsing, drying)?: None Help from another person to put on and taking off regular upper body clothing?: None Help from another person to put on and taking off regular lower body clothing?: None 6 Click Score: 24   End of Session Equipment Utilized During Treatment: Gait belt;Rollator (4 wheels) Nurse Communication: Mobility status  Activity Tolerance: Patient tolerated treatment well Patient left: in chair;with call bell/phone within reach;with family/visitor present                   Time: 1610-9604 OT Time Calculation (min): 22 min Charges:  OT General Charges $OT Visit: 1 Visit OT Evaluation $OT Eval Low Complexity: 1 Low Batool Majid OT/L Acute Rehabilitation Department  [phone]  07/13/2023, 11:15 AM

## 2023-07-14 ENCOUNTER — Encounter: Payer: Self-pay | Admitting: Pulmonary Disease

## 2023-07-14 DIAGNOSIS — I480 Paroxysmal atrial fibrillation: Secondary | ICD-10-CM | POA: Diagnosis not present

## 2023-07-14 DIAGNOSIS — J189 Pneumonia, unspecified organism: Secondary | ICD-10-CM | POA: Diagnosis not present

## 2023-07-14 DIAGNOSIS — E871 Hypo-osmolality and hyponatremia: Secondary | ICD-10-CM | POA: Diagnosis not present

## 2023-07-14 LAB — BASIC METABOLIC PANEL WITH GFR
Anion gap: 10 (ref 5–15)
BUN: 8 mg/dL (ref 8–23)
CO2: 21 mmol/L — ABNORMAL LOW (ref 22–32)
Calcium: 8 mg/dL — ABNORMAL LOW (ref 8.9–10.3)
Chloride: 96 mmol/L — ABNORMAL LOW (ref 98–111)
Creatinine, Ser: 0.94 mg/dL (ref 0.61–1.24)
GFR, Estimated: >60 mL/min (ref >60)
Glucose, Bld: 140 mg/dL — ABNORMAL HIGH (ref 70–99)
Potassium: 4 mmol/L (ref 3.5–5.1)
Sodium: 127 mmol/L — ABNORMAL LOW (ref 135–145)

## 2023-07-14 LAB — CULTURE, RESPIRATORY W GRAM STAIN: Culture: NORMAL

## 2023-07-14 LAB — SODIUM
Sodium: 128 mmol/L — ABNORMAL LOW (ref 135–145)
Sodium: 123 mmol/L — ABNORMAL LOW (ref 135–145)

## 2023-07-14 MED ORDER — BENZONATATE 100 MG PO CAPS
100.0000 mg | ORAL_CAPSULE | Freq: Once | ORAL | Status: AC
  Administered 2023-07-14: 100 mg via ORAL
  Filled 2023-07-14: qty 1

## 2023-07-14 MED ORDER — METOPROLOL TARTRATE 5 MG/5ML IV SOLN
5.0000 mg | INTRAVENOUS | Status: AC
  Administered 2023-07-14: 5 mg via INTRAVENOUS
  Filled 2023-07-14: qty 5

## 2023-07-14 MED ORDER — DOXYCYCLINE HYCLATE 100 MG PO TABS
100.0000 mg | ORAL_TABLET | Freq: Two times a day (BID) | ORAL | Status: DC
  Administered 2023-07-14 – 2023-07-17 (×6): 100 mg via ORAL
  Filled 2023-07-14 (×6): qty 1

## 2023-07-14 MED ORDER — BENZONATATE 100 MG PO CAPS
100.0000 mg | ORAL_CAPSULE | Freq: Three times a day (TID) | ORAL | Status: AC | PRN
Start: 2023-07-14 — End: 2023-07-14
  Administered 2023-07-14 (×2): 100 mg via ORAL
  Filled 2023-07-14 (×2): qty 1

## 2023-07-14 NOTE — Progress Notes (Signed)
 Mobility Specialist - Progress Note   07/14/23 1200  Mobility  Activity Ambulated with assistance in hallway  Level of Assistance Modified independent, requires aide device or extra time  Assistive Device Four wheel walker  Distance Ambulated (ft) 350 ft  Range of Motion/Exercises Active  Activity Response Tolerated well  Mobility Referral Yes  Mobility visit 1 Mobility  Mobility Specialist Start Time (ACUTE ONLY) 1150  Mobility Specialist Stop Time (ACUTE ONLY) 1200  Mobility Specialist Time Calculation (min) (ACUTE ONLY) 10 min   Pt was found in room and agreeable to ambulate. No complaints with session. At EOS returned to room with all needs met. Call bell in reach and wife in room.  Lorna Rose Mobility Specialist

## 2023-07-14 NOTE — Progress Notes (Addendum)
 Pt YELLOW MEWs and c/o of constant cough, HR elevated 130-150's for the past 45 minutes, RESP 35-75m pt is more fatigue and exhausted MD made aware. Orders received and completed  07/14/23 2108  Vitals  Temp (!) 101.9 F (38.8 C)  Temp Source Oral  BP (!) 149/86  MAP (mmHg) 105  BP Location Left Arm  BP Method Automatic  Patient Position (if appropriate) Lying  Resp 20  MEWS COLOR  MEWS Score Color Yellow  Oxygen  Therapy  SpO2 93 %  O2 Device Room Air

## 2023-07-14 NOTE — Plan of Care (Signed)
   Problem: Clinical Measurements: Goal: Ability to maintain clinical measurements within normal limits will improve Outcome: Progressing Goal: Will remain free from infection Outcome: Progressing Goal: Diagnostic test results will improve Outcome: Progressing Goal: Respiratory complications will improve Outcome: Progressing

## 2023-07-14 NOTE — Progress Notes (Signed)
   07/14/23 1430  cefTRIAXone  (ROCEPHIN ) 1 g in sodium chloride  0.9 % 100 mL IVPB  Volume (mL) 0  Urine Measurement/Characteristics  Urine (mL) 400 mL  Urinary Incontinence No  Urine Color Yellow/straw  Urine Appearance Clear  Urinary Interventions Bladder scan  Bladder Scan Volume (mL) 0 mL   MD made aware that patient was complaining of feeling bloated all over and stated that he was not urinating a lot today. RN told patient to make sure he urinates in the urinal and not the toilet so staff can keep a measurement of urinary output. RN bladder scanned patient after patient urinated 400 mL of urine and patient had 0 mL in bladder. MD was also made aware of patient's low grade fever of 99.8 and mild bilateral lower extremity edema.

## 2023-07-14 NOTE — Plan of Care (Signed)

## 2023-07-14 NOTE — Progress Notes (Signed)
 Tesselone Pearle given to pt for cough. Will cont to monitor. SRP, RN

## 2023-07-14 NOTE — Progress Notes (Signed)
 PROGRESS NOTE    Brandon Simmons.  BMW:[id number] DOB: 10-21-1945 DOA: 07/11/2023 PCP: Bertha Broad, MD   Brief Narrative: Brandon Quamaine Kirchberg. is a 78 y.o. male with a history of proximal atrial fibrillation, hypertension, asthma, prostate cancer status post prostatectomy and radiation therapy, DVT, GERD, hyperlipidemia.  Patient presented secondary to fever, cough, and altered mental status/confusion with evidence of community-acquired pneumonia.  Empiric antibiotics started.  Blood cultures ordered and are pending.   Assessment and Plan:  Community-acquired pneumonia Patient with fever and leukocytosis. Chest x-ray with possible bibasilar early infiltrates. Blood cultures obtained. Empiric Ceftriaxone  and azithromycin  started. Sputum culture significant for GNR. Febrile overnight. -Continue Azithromycin  and Ceftriaxone  -Follow-up blood cultures  Hyponatremia Sodium of 125 on admission. Unclear etiology but presumed secondary to hypovolemia. Serum osmolality of 270. Urine sodium undetectably low with urine osmolality of 421. Managed initially with IV fluids with stagnant sodium. Discussed with nephrology who recommended to stop IV fluids and start salt tablets for possible volume depletion SAIDH combination. Repeat urine sodium/osmolality <10/176. Sodium improved to 127. -Salt tablets 1 g TID -BMP daily  Hypokalemia -Potassium supplementation to keep potassium >4  Acute metabolic encephalopathy Presumed secondary to pneumonia. Hyponatremia likely not bad enough to contribute. CT head negative for acute process.  Normocytic anemia Mild. Resolved.  Chronic thrombocytopenia Unclear etiology. Platelets of 131 on admission, down to 117 today. Likely reactive related to acute illness.  Paroxysmal atrial fibrillation Patient is on Eliquis  and dofetilide  as an outpatient. From last cardiology note, diltiazem  180 mg daily discontinued secondary to  bradycardia. -Continue Eliquis  -Continue dofetilide  -Keep potassium greater than 4 and magnesium  greater than 2  Asthma Stable.  Primary hypertension Patient is on losartan  and diltiazem  as an outpatient.  GERD Patient is on omeprazole as an outpatient. -Protonix  while inpatient  Hyperlipidemia -Continue Lipitor   DVT prophylaxis: Eliquis  Code Status:   Code Status: Full Code Family Communication: Wife at bedside Disposition Plan: Discharge home likely in 1-2 days pending improved sodium and if remaining afebrile.   Consultants:  None  Procedures:  None  Antimicrobials: Ceftriaxone  Doxycycline     Subjective: No concerns overnight.  Objective: BP (!) 144/76 (BP Location: Left Arm)   Pulse 73   Temp 99.8 F (37.7 C) (Oral)   Resp (!) 22   Ht 5\' 10"  (1.778 m)   Wt 100.6 kg   SpO2 96%   BMI 31.82 kg/m   Examination:  General exam: Appears calm and comfortable Respiratory system: Right sided rales. Respiratory effort normal. Cardiovascular system: S1 & S2 heard, RRR. No murmurs. Gastrointestinal system: Abdomen is mildly distended, soft and nontender. Normal bowel sounds heard. Central nervous system: Alert and oriented. No focal neurological deficits. Musculoskeletal: BLE edema. No calf tenderness Psychiatry: Judgement and insight appear normal. Mood & affect appropriate.    Data Reviewed: I have personally reviewed following labs and imaging studies  CBC Lab Results  Component Value Date   WBC 10.4 07/13/2023   RBC 4.05 (L) 07/13/2023   HGB 13.1 07/13/2023   HCT 37.1 (L) 07/13/2023   MCV 91.6 07/13/2023   MCH 32.3 07/13/2023   PLT 116 (L) 07/13/2023   MCHC 35.3 07/13/2023   RDW 11.8 07/13/2023   LYMPHSABS 1.3 07/11/2023   MONOABS 1.3 (H) 07/11/2023   EOSABS 0.0 07/11/2023   BASOSABS 0.1 07/11/2023     Last metabolic panel Lab Results  Component Value Date   NA 128 (L) 07/14/2023   K 4.0 07/14/2023  CL 96 (L) 07/14/2023   CO2 21  (L) 07/14/2023   BUN 8 07/14/2023   CREATININE 0.94 07/14/2023   GLUCOSE 140 (H) 07/14/2023   GFRNONAA >60 07/14/2023   GFRAA 74 11/03/2019   CALCIUM  8.0 (L) 07/14/2023   PHOS 2.8 11/29/2021   PROT 5.5 (L) 07/13/2023   ALBUMIN 2.8 (L) 07/13/2023   BILITOT 0.9 07/13/2023   ALKPHOS 50 07/13/2023   AST 60 (H) 07/13/2023   ALT 29 07/13/2023   ANIONGAP 10 07/14/2023    GFR: Estimated Creatinine Clearance: 77 mL/min (by C-G formula based on SCr of 0.94 mg/dL).  Recent Results (from the past 240 hours)  Culture, blood (Routine x 2)     Status: None (Preliminary result)   Collection Time: 07/11/23  9:51 PM   Specimen: BLOOD  Result Value Ref Range Status   Specimen Description   Final    BLOOD RIGHT ANTECUBITAL Performed at [facility], [address], [place], [place] [address]    Special Requests   Final    BOTTLES DRAWN AEROBIC AND ANAEROBIC Blood Culture adequate volume Performed at [facility], [address], [place], [place] [address]    Culture   Final    NO GROWTH 2 DAYS Performed at [facility], [address] [place], [place] [address]    Report Status PENDING  Incomplete  Culture, blood (Routine x 2)     Status: None (Preliminary result)   Collection Time: 07/11/23  9:51 PM   Specimen: BLOOD RIGHT FOREARM  Result Value Ref Range Status   Specimen Description   Final    BLOOD RIGHT FOREARM Performed at [facility], [address] [place], [place] [address]    Special Requests   Final    BOTTLES DRAWN AEROBIC AND ANAEROBIC Blood Culture adequate volume Performed at [facility], [address] [place], [place] [address]    Culture   Final    NO GROWTH 2 DAYS Performed at [facility], [address], [place], [place] [address]    Report Status PENDING  Incomplete  MRSA Next Gen by PCR, Nasal     Status: None   Collection Time: 07/12/23  6:03 AM   Specimen:  Anterior Nasal Swab  Result Value Ref Range Status   MRSA by PCR Next Gen NOT DETECTED NOT DETECTED Final    Comment: (NOTE) The GeneXpert MRSA Assay (FDA approved for NASAL specimens only), is one component of a comprehensive MRSA colonization surveillance program. It is not intended to diagnose MRSA infection nor to guide or monitor treatment for MRSA infections. Test performance is not FDA approved in patients less than 11 years old. Performed at [facility], [address], [place], [place] [address]   Resp panel by RT-PCR (RSV, Flu A&B, Covid) Anterior Nasal Swab     Status: None   Collection Time: 07/12/23  6:04 AM   Specimen: Anterior Nasal Swab  Result Value Ref Range Status   SARS Coronavirus 2 by RT PCR NEGATIVE NEGATIVE Final    Comment: (NOTE) SARS-CoV-2 target nucleic acids are NOT DETECTED.  The SARS-CoV-2 RNA is generally detectable in upper respiratory specimens during the acute phase of infection. The lowest concentration of SARS-CoV-2 viral copies this assay can detect is 138 copies/mL. A negative result does not preclude SARS-Cov-2 infection and should not be used as the sole basis for treatment or other patient management decisions. A negative result may occur with  improper specimen collection/handling, submission of specimen other than nasopharyngeal swab, presence of viral mutation(s) within the areas targeted by this assay, and inadequate number of viral copies(<138 copies/mL). A negative result must be combined with clinical observations, patient history, and epidemiological information. The expected result is Negative.  Fact Sheet for Patients:  BloggerCourse.com  Fact Sheet for Healthcare Providers:  SeriousBroker.it  This test is no t yet approved or cleared by the [place]  FDA and  has been authorized for detection and/or diagnosis of SARS-CoV-2 by FDA under an Emergency Use  Authorization (EUA). This EUA will remain  in effect (meaning this test can be used) for the duration of the COVID-19 declaration under Section 564(b)(1) of the Act, 21 U.S.C.section 360bbb-3(b)(1), unless the authorization is terminated  or revoked sooner.       Influenza A by PCR NEGATIVE NEGATIVE Final   Influenza B by PCR NEGATIVE NEGATIVE Final    Comment: (NOTE) The Xpert Xpress SARS-CoV-2/FLU/RSV plus assay is intended as an aid in the diagnosis of influenza from Nasopharyngeal swab specimens and should not be used as a sole basis for treatment. Nasal washings and aspirates are unacceptable for Xpert Xpress SARS-CoV-2/FLU/RSV testing.  Fact Sheet for Patients: BloggerCourse.com  Fact Sheet for Healthcare Providers: SeriousBroker.it  This test is not yet approved or cleared by the [place]  FDA and has been authorized for detection and/or diagnosis of SARS-CoV-2 by FDA under an Emergency Use Authorization (EUA). This EUA will remain in effect (meaning this test can be used) for the duration of the COVID-19 declaration under Section 564(b)(1) of the Act, 21 U.S.C. section 360bbb-3(b)(1), unless the authorization is terminated or revoked.     Resp Syncytial Virus by PCR NEGATIVE NEGATIVE Final    Comment: (NOTE) Fact Sheet for Patients: BloggerCourse.com  Fact Sheet for Healthcare Providers: SeriousBroker.it  This test is not yet approved or cleared by the [place]  FDA and has been authorized for detection and/or diagnosis of SARS-CoV-2 by FDA under an Emergency Use Authorization (EUA). This EUA will remain in effect (meaning this test can be used) for the duration of the COVID-19 declaration under Section 564(b)(1) of the Act, 21 U.S.C. section 360bbb-3(b)(1), unless the authorization is terminated or revoked.  Performed at [facility],  [address], [place], [place] [address]   Expectorated Sputum Assessment w Gram Stain, Rflx to Resp Cult     Status: None   Collection Time: 07/12/23 11:51 AM   Specimen: Sputum  Result Value Ref Range Status   Specimen Description SPUTUM  Final   Special Requests NONE  Final   Sputum evaluation   Final    THIS SPECIMEN IS ACCEPTABLE FOR SPUTUM CULTURE Performed at [facility], [address] [place], [place] [address]    Report Status 07/12/2023 FINAL  Final  Culture, Respiratory w Gram Stain     Status: None   Collection Time: 07/12/23 11:51 AM   Specimen: SPU  Result Value Ref Range Status   Specimen Description   Final    SPUTUM Performed at [facility], [address] [place], [place] [address]    Special Requests   Final    NONE Reflexed from [id number] Performed at [facility], [address], [place], [place] [address]    Gram Stain   Final    ABUNDANT WBC PRESENT, PREDOMINANTLY PMN RARE GRAM NEGATIVE RODS    Culture   Final    FEW Normal respiratory flora-no  Staph aureus or Pseudomonas seen Performed at [facility], [address] [place], [place] [address]    Report Status 07/14/2023 FINAL  Final      Radiology Studies: No results found.     LOS: 2 days    Aneita Keens, MD [facility] 07/14/2023, 2:38 PM   If 7PM-7AM, please contact night-coverage [email or web address]

## 2023-07-14 NOTE — Progress Notes (Signed)
 Acute cough improved after the above treatment. Pt sleeping intermittently, up to bathroom with walker. SRP, RN

## 2023-07-15 ENCOUNTER — Inpatient Hospital Stay (HOSPITAL_COMMUNITY)

## 2023-07-15 DIAGNOSIS — I480 Paroxysmal atrial fibrillation: Secondary | ICD-10-CM | POA: Diagnosis not present

## 2023-07-15 DIAGNOSIS — R918 Other nonspecific abnormal finding of lung field: Secondary | ICD-10-CM

## 2023-07-15 DIAGNOSIS — J189 Pneumonia, unspecified organism: Secondary | ICD-10-CM | POA: Diagnosis not present

## 2023-07-15 DIAGNOSIS — E871 Hypo-osmolality and hyponatremia: Secondary | ICD-10-CM | POA: Diagnosis not present

## 2023-07-15 LAB — CBC
HCT: 34.1 % — ABNORMAL LOW (ref 39.0–52.0)
Hemoglobin: 11.6 g/dL — ABNORMAL LOW (ref 13.0–17.0)
MCH: 31.6 pg (ref 26.0–34.0)
MCHC: 34 g/dL (ref 30.0–36.0)
MCV: 92.9 fL (ref 80.0–100.0)
Platelets: 166 10*3/uL (ref 150–400)
RBC: 3.67 MIL/uL — ABNORMAL LOW (ref 4.22–5.81)
RDW: 11.9 % (ref 11.5–15.5)
WBC: 13.8 10*3/uL — ABNORMAL HIGH (ref 4.0–10.5)
nRBC: 0 % (ref 0.0–0.2)

## 2023-07-15 LAB — BASIC METABOLIC PANEL WITH GFR
Anion gap: 10 (ref 5–15)
BUN: 10 mg/dL (ref 8–23)
CO2: 24 mmol/L (ref 22–32)
Calcium: 8.2 mg/dL — ABNORMAL LOW (ref 8.9–10.3)
Chloride: 94 mmol/L — ABNORMAL LOW (ref 98–111)
Creatinine, Ser: 0.86 mg/dL (ref 0.61–1.24)
GFR, Estimated: >60 mL/min (ref >60)
Glucose, Bld: 139 mg/dL — ABNORMAL HIGH (ref 70–99)
Potassium: 3.5 mmol/L (ref 3.5–5.1)
Sodium: 128 mmol/L — ABNORMAL LOW (ref 135–145)

## 2023-07-15 LAB — LEGIONELLA PNEUMOPHILA SEROGP 1 UR AG: L. pneumophila Serogp 1 Ur Ag: NEGATIVE

## 2023-07-15 LAB — OSMOLALITY, URINE: Osmolality, Ur: 218 mosm/kg — ABNORMAL LOW (ref 300–900)

## 2023-07-15 LAB — SODIUM, URINE, RANDOM: Sodium, Ur: <10 mmol/L

## 2023-07-15 LAB — SODIUM
Sodium: 126 mmol/L — ABNORMAL LOW (ref 135–145)
Sodium: 127 mmol/L — ABNORMAL LOW (ref 135–145)

## 2023-07-15 MED ORDER — POTASSIUM CHLORIDE CRYS ER 20 MEQ PO TBCR
40.0000 meq | EXTENDED_RELEASE_TABLET | Freq: Once | ORAL | Status: AC
  Administered 2023-07-15: 40 meq via ORAL
  Filled 2023-07-15: qty 2

## 2023-07-15 MED ORDER — GUAIFENESIN-DM 100-10 MG/5ML PO SYRP
5.0000 mL | ORAL_SOLUTION | ORAL | Status: DC | PRN
  Administered 2023-07-15 – 2023-07-16 (×2): 5 mL via ORAL
  Filled 2023-07-15 (×2): qty 10

## 2023-07-15 MED ORDER — POTASSIUM CHLORIDE CRYS ER 20 MEQ PO TBCR
40.0000 meq | EXTENDED_RELEASE_TABLET | ORAL | Status: AC
  Administered 2023-07-15 (×2): 40 meq via ORAL
  Filled 2023-07-15 (×2): qty 2

## 2023-07-15 MED ORDER — HYDROXYZINE HCL 25 MG PO TABS
25.0000 mg | ORAL_TABLET | Freq: Three times a day (TID) | ORAL | Status: DC | PRN
  Administered 2023-07-15 – 2023-07-16 (×3): 25 mg via ORAL
  Filled 2023-07-15 (×3): qty 1

## 2023-07-15 MED ORDER — FUROSEMIDE 10 MG/ML IJ SOLN
20.0000 mg | Freq: Once | INTRAMUSCULAR | Status: AC
  Administered 2023-07-15: 20 mg via INTRAVENOUS
  Filled 2023-07-15: qty 2

## 2023-07-15 MED ORDER — POTASSIUM CHLORIDE CRYS ER 20 MEQ PO TBCR: 40.0000 meq | EXTENDED_RELEASE_TABLET | Freq: Once | ORAL | Status: DC

## 2023-07-15 MED ORDER — HYDROCOD POLI-CHLORPHE POLI ER 10-8 MG/5ML PO SUER
5.0000 mL | Freq: Every evening | ORAL | Status: DC | PRN
  Administered 2023-07-15 – 2023-07-16 (×2): 5 mL via ORAL
  Filled 2023-07-15 (×2): qty 5

## 2023-07-15 MED ORDER — BENZONATATE 100 MG PO CAPS
100.0000 mg | ORAL_CAPSULE | Freq: Three times a day (TID) | ORAL | Status: AC | PRN
Start: 2023-07-15 — End: 2023-07-16
  Administered 2023-07-15 (×2): 100 mg via ORAL
  Filled 2023-07-15 (×2): qty 1

## 2023-07-15 MED ORDER — POTASSIUM CHLORIDE 10 MEQ/100ML IV SOLN
10.0000 meq | INTRAVENOUS | Status: AC
  Administered 2023-07-15 (×2): 10 meq via INTRAVENOUS
  Filled 2023-07-15 (×2): qty 100

## 2023-07-15 MED ORDER — ORAL CARE MOUTH RINSE: 15.0000 mL | OROMUCOSAL | Status: DC | PRN

## 2023-07-15 NOTE — Progress Notes (Signed)
 Status Update:Pt rate improved 80-90's and attempting to convert to NSR-- NSR w/PAC and A-fib.  Current temp is 98.8 and the Tessalone Pearle cough med effective in controling cough that pt was able to rest. Pt had dose previous night same symptoms, may need maintenance. Will ask AM RN to follow up with MD.

## 2023-07-15 NOTE — Progress Notes (Signed)
 Mobility Specialist - Progress Note   07/15/23 1155  Mobility  Activity Ambulated with assistance in hallway  Level of Assistance Standby assist, set-up cues, supervision of patient - no hands on  Assistive Device Four wheel walker  Distance Ambulated (ft) 350 ft  Range of Motion/Exercises Active  Activity Response Tolerated fair  Mobility Referral Yes  Mobility visit 1 Mobility  Mobility Specialist Start Time (ACUTE ONLY) 1145  Mobility Specialist Stop Time (ACUTE ONLY) 1155  Mobility Specialist Time Calculation (min) (ACUTE ONLY) 10 min   Pt was found in bed and agreeable to ambulate. Had harder work of breathing with session. Had x2 seated rest breaks. At EOS returned to bed with all needs met. Call bell in reach and wife in room.  Lorna Rose Mobility Specialist

## 2023-07-15 NOTE — Progress Notes (Signed)
 Patient's daughter asked RN if it was possible that patient could go off the floor for a few minutes and go outside. RN reached out to MD to see, and MD said that patient could with staff. Passed along to night shift RN.

## 2023-07-15 NOTE — Progress Notes (Signed)
 PROGRESS NOTE    Brandon Simmons.  WUJ:[id number] DOB: [birth year] DOA: 07/11/2023 PCP: Bertha Broad, MD   Brief Narrative: Brandon Simmons. is a 78 y.o. male with a history of proximal atrial fibrillation, hypertension, asthma, prostate cancer status post prostatectomy and radiation therapy, DVT, GERD, hyperlipidemia.  Patient presented secondary to fever, cough, and altered mental status/confusion with evidence of community-acquired pneumonia.  Empiric antibiotics started.  Blood cultures ordered and are pending.   Assessment and Plan:  Community-acquired pneumonia Patient with fever and leukocytosis. Chest x-ray with possible bibasilar early infiltrates. Blood cultures obtained. Empiric Ceftriaxone  and azithromycin  started. Sputum culture significant for GNR. Febrile overnight. Repeat chest x-ray obtained with evidence of progressive multifocal infiltrates. -Continue Azithromycin  and Ceftriaxone  -Follow-up blood cultures -Pulmonology consult  Hyponatremia Sodium of 125 on admission. Unclear etiology but presumed secondary to hypovolemia. Serum osmolality of 270. Urine sodium undetectably low with urine osmolality of 421. Managed initially with IV fluids with stagnant sodium. Discussed with nephrology who recommended to stop IV fluids and start salt tablets for possible volume depletion SAIDH combination. Repeat urine sodium/osmolality <10/176. Sodium improved to 128 but mostly stable. -Repeat urine sodium and osmolality -Salt tablets 1 g TID -BMP daily  Hypokalemia -Potassium supplementation to keep potassium >4  Acute metabolic encephalopathy Presumed secondary to pneumonia. Hyponatremia likely not bad enough to contribute. CT head negative for acute process. Resolved.  Normocytic anemia Mild. Resolved.  Chronic thrombocytopenia Unclear etiology. Platelets of 131 on admission, down to 117 today. Likely reactive related to acute illness.  Paroxysmal  atrial fibrillation Patient is on Eliquis  and dofetilide  as an outpatient. From last cardiology note, diltiazem  180 mg daily discontinued secondary to bradycardia. -Continue Eliquis  -Continue dofetilide  -Keep potassium greater than 4 and magnesium  greater than 2  Asthma Stable.  Primary hypertension Patient is on losartan  and diltiazem  as an outpatient.  GERD Patient is on omeprazole as an outpatient. -Protonix  while inpatient  Hyperlipidemia -Continue Lipitor   DVT prophylaxis: Eliquis  Code Status:   Code Status: Full Code Family Communication: Wife at bedside Disposition Plan: Discharge home likely in 1-2 days pending improved sodium and if remaining afebrile in addition to pulmonology recommendations   Consultants:  None  Procedures:  None  Antimicrobials: Ceftriaxone  Doxycycline     Subjective: Patient reports feeling worse this morning. Continued coughing. Febrile overnight with a Tmax of 101.9 F.  Objective: BP 128/63 (BP Location: Right Arm)   Pulse 69   Temp 98.6 F (37 C) (Oral)   Resp 20   Ht 5\' 10"  (1.778 m)   Wt 100.6 kg   SpO2 97%   BMI 31.82 kg/m   Examination:  General exam: Appears calm and comfortable Respiratory system: Bilateral lower rales. Respiratory effort normal. Cardiovascular system: S1 & S2 heard, RRR. No murmurs, rubs, gallops or clicks. Gastrointestinal system: Abdomen is nondistended, soft and nontender. No organomegaly or masses felt. Normal bowel sounds heard. Central nervous system: Alert and oriented. No focal neurological deficits. Musculoskeletal: 1+ BLE edema. No calf tenderness Psychiatry: Judgement and insight appear normal. Mood & affect appropriate.    Data Reviewed: I have personally reviewed following labs and imaging studies  CBC Lab Results  Component Value Date   WBC 13.8 (H) 07/15/2023   RBC 3.67 (L) 07/15/2023   HGB 11.6 (L) 07/15/2023   HCT 34.1 (L) 07/15/2023   MCV 92.9 07/15/2023   MCH 31.6  07/15/2023   PLT 166 07/15/2023   MCHC 34.0 07/15/2023   RDW 11.9  07/15/2023   LYMPHSABS 1.3 07/11/2023   MONOABS 1.3 (H) 07/11/2023   EOSABS 0.0 07/11/2023   BASOSABS 0.1 07/11/2023     Last metabolic panel Lab Results  Component Value Date   NA 128 (L) 07/15/2023   K 3.5 07/15/2023   CL 94 (L) 07/15/2023   CO2 24 07/15/2023   BUN 10 07/15/2023   CREATININE 0.86 07/15/2023   GLUCOSE 139 (H) 07/15/2023   GFRNONAA >60 07/15/2023   GFRAA 74 11/03/2019   CALCIUM  8.2 (L) 07/15/2023   PHOS 2.8 11/29/2021   PROT 5.5 (L) 07/13/2023   ALBUMIN 2.8 (L) 07/13/2023   BILITOT 0.9 07/13/2023   ALKPHOS 50 07/13/2023   AST 60 (H) 07/13/2023   ALT 29 07/13/2023   ANIONGAP 10 07/15/2023    GFR: Estimated Creatinine Clearance: 84.1 mL/min (by C-G formula based on SCr of 0.86 mg/dL).  Recent Results (from the past 240 hours)  Culture, blood (Routine x 2)     Status: None (Preliminary result)   Collection Time: 07/11/23  9:51 PM   Specimen: BLOOD  Result Value Ref Range Status   Specimen Description   Final    BLOOD RIGHT ANTECUBITAL Performed at [facility], [address], [place], [place] [address]    Special Requests   Final    BOTTLES DRAWN AEROBIC AND ANAEROBIC Blood Culture adequate volume Performed at [facility], [address], [place], [place] [address]    Culture   Final    NO GROWTH 3 DAYS Performed at [facility], [address] [place], [place] [address]    Report Status PENDING  Incomplete  Culture, blood (Routine x 2)     Status: None (Preliminary result)   Collection Time: 07/11/23  9:51 PM   Specimen: BLOOD RIGHT FOREARM  Result Value Ref Range Status   Specimen Description   Final    BLOOD RIGHT FOREARM Performed at [facility], [address], [place], [place] [address]    Special Requests   Final    BOTTLES DRAWN AEROBIC AND ANAEROBIC Blood Culture adequate volume Performed at [facility], [address] [place], [place] [address]    Culture   Final    NO GROWTH 3 DAYS Performed at [facility], [address] [place], [place] [address]    Report Status PENDING  Incomplete  MRSA Next Gen by PCR, Nasal     Status: None   Collection Time: 07/12/23  6:03 AM   Specimen: Anterior Nasal Swab  Result Value Ref Range Status   MRSA by PCR Next Gen NOT DETECTED NOT DETECTED Final    Comment: (NOTE) The GeneXpert MRSA Assay (FDA approved for NASAL specimens only), is one component of a comprehensive MRSA colonization surveillance program. It is not intended to diagnose MRSA infection nor to guide or monitor treatment for MRSA infections. Test performance is not FDA approved in patients less than 85 years old. Performed at [facility], [address], [place], [place] [address]   Resp panel by RT-PCR (RSV, Flu A&B, Covid) Anterior Nasal Swab     Status: None   Collection Time: 07/12/23  6:04 AM   Specimen: Anterior Nasal Swab  Result Value Ref Range Status   SARS Coronavirus 2 by RT PCR NEGATIVE NEGATIVE Final    Comment: (NOTE) SARS-CoV-2 target nucleic acids are NOT DETECTED.  The SARS-CoV-2 RNA is generally detectable in upper respiratory specimens during the acute phase of  infection. The lowest concentration of SARS-CoV-2 viral copies this assay can detect is 138 copies/mL. A negative result does not preclude SARS-Cov-2 infection and should not be used as the sole basis for treatment or other patient management decisions. A negative result may occur with  improper specimen collection/handling, submission of specimen other than nasopharyngeal swab, presence of viral mutation(s) within the areas targeted by this assay, and inadequate number of viral copies(<138 copies/mL). A negative result must be combined with clinical observations, patient history, and epidemiological information. The expected result is  Negative.  Fact Sheet for Patients:  BloggerCourse.com  Fact Sheet for Healthcare Providers:  SeriousBroker.it  This test is no t yet approved or cleared by the [place]  FDA and  has been authorized for detection and/or diagnosis of SARS-CoV-2 by FDA under an Emergency Use Authorization (EUA). This EUA will remain  in effect (meaning this test can be used) for the duration of the COVID-19 declaration under Section 564(b)(1) of the Act, 21 U.S.C.section 360bbb-3(b)(1), unless the authorization is terminated  or revoked sooner.       Influenza A by PCR NEGATIVE NEGATIVE Final   Influenza B by PCR NEGATIVE NEGATIVE Final    Comment: (NOTE) The Xpert Xpress SARS-CoV-2/FLU/RSV plus assay is intended as an aid in the diagnosis of influenza from Nasopharyngeal swab specimens and should not be used as a sole basis for treatment. Nasal washings and aspirates are unacceptable for Xpert Xpress SARS-CoV-2/FLU/RSV testing.  Fact Sheet for Patients: BloggerCourse.com  Fact Sheet for Healthcare Providers: SeriousBroker.it  This test is not yet approved or cleared by the [place]  FDA and has been authorized for detection and/or diagnosis of SARS-CoV-2 by FDA under an Emergency Use Authorization (EUA). This EUA will remain in effect (meaning this test can be used) for the duration of the COVID-19 declaration under Section 564(b)(1) of the Act, 21 U.S.C. section 360bbb-3(b)(1), unless the authorization is terminated or revoked.     Resp Syncytial Virus by PCR NEGATIVE NEGATIVE Final    Comment: (NOTE) Fact Sheet for Patients: BloggerCourse.com  Fact Sheet for Healthcare Providers: SeriousBroker.it  This test is not yet approved or cleared by the [place]  FDA and has been authorized for detection and/or diagnosis of  SARS-CoV-2 by FDA under an Emergency Use Authorization (EUA). This EUA will remain in effect (meaning this test can be used) for the duration of the COVID-19 declaration under Section 564(b)(1) of the Act, 21 U.S.C. section 360bbb-3(b)(1), unless the authorization is terminated or revoked.  Performed at [facility], [address], [place], [place] [address]   Expectorated Sputum Assessment w Gram Stain, Rflx to Resp Cult     Status: None   Collection Time: 07/12/23 11:51 AM   Specimen: Sputum  Result Value Ref Range Status   Specimen Description SPUTUM  Final   Special Requests NONE  Final   Sputum evaluation   Final    THIS SPECIMEN IS ACCEPTABLE FOR SPUTUM CULTURE Performed at [facility], [address], [place], [place] [address]    Report Status 07/12/2023 FINAL  Final  Culture, Respiratory w Gram Stain     Status: None   Collection Time: 07/12/23 11:51 AM   Specimen: SPU  Result Value Ref Range Status   Specimen Description   Final    SPUTUM Performed at [facility], [address] [place], [place] [address]    Special Requests   Final    NONE Reflexed from [id number] Performed  at [facility], [address], [place], [place] [address]    Gram Stain   Final    ABUNDANT WBC PRESENT, PREDOMINANTLY PMN RARE GRAM NEGATIVE RODS    Culture   Final    FEW Normal respiratory flora-no Staph aureus or Pseudomonas seen Performed at [facility], [address], [place], [place] [address]    Report Status 07/14/2023 FINAL  Final      Radiology Studies: DG CHEST PORT 1 VIEW Result Date: 07/15/2023 CLINICAL DATA:  Cough.  Shortness of breath.  Pneumonia. EXAM: PORTABLE CHEST 1 VIEW COMPARISON:  07/11/2023 FINDINGS: Left shoulder arthroplasty. Patient rotated right. Mild cardiomegaly. No pleural effusion or pneumothorax. New right upper lobe airspace disease. Increased bibasilar airspace disease.  Relative sparing of the left upper lung. IMPRESSION: New and progressive multifocal pneumonia. Electronically Signed   By: Lore Rode M.D.   On: 07/15/2023 10:04       LOS: 3 days    Aneita Keens, MD [facility] 07/15/2023, 12:33 PM   If 7PM-7AM, please contact night-coverage [email or web address]

## 2023-07-15 NOTE — Consult Note (Signed)
 NAME:  Brandon Lovgren., MRN:  [id number], DOB:  Apr 09, 1945, LOS: 3 ADMISSION DATE:  07/11/2023, CONSULTATION DATE: 07/15/2023 REFERRING MD: Dr. Duard Getting, CHIEF COMPLAINT: Pneumonia  History of Present Illness:  78 year old man with paroxysmal atrial fibrillation on dofetilide , Eliquis , hypertension, prostate cancer with a prostatectomy and radiation therapy, DVT in 2019, GERD and hyperlipidemia, COVID-19 in 2022.  Per chart notes he also had right sided community-acquired pneumonia in 10/2011.  He has been followed by Dr. Phill Brazil and also by Dr. Waylan Haggard for allergic rhinitis and asthma.   He was admitted 07/11/2023 with 4 days of cough, fever and subtle right greater than left pulmonary infiltrates concerning for possible pneumonia.  He did have a sick contact several days prior.  COVID and flu were negative at his PCPs office 2 days prior to admission, and then again on 4/24 at admission.  He was started on ceftriaxone  and azithromycin .  Labs revealed mild leukocytosis without left shift or eosinophilia, hyponatremia of unclear cause, urine sodium < 10 and serum osm 270, and he did report poor p.o. intake in the days prior to admission.  He has continued to have fever on antibiotics.  Sputum for 24 showed abundant PMN, rare GNR but grew out normal flora.  Legionella and pneumococcal antigens were both negative.  Repeat chest x-ray done 4/27 showed some progression of his bilateral right greater than left airspace disease.  Pertinent  Medical History   Past Medical History:  Diagnosis Date   Aortic atherosclerosis (HCC)    Arthritis    Asthma    Diverticulitis    Diverticulosis    DVT (deep venous thrombosis) (HCC) 2019   right leg after back surgery   Dysrhythmia    A-fib every few weeks   Ectatic thoracic aorta (HCC)    a. 4.3cm by CT 03/2018.   GERD (gastroesophageal reflux disease)    Hemorrhoids    History of echocardiogram    a. Echo 10/16 [facility]):  EF 55%, trace MR, mild  TR, mild to mod AI, mild dilated Ao root (41 mm)   Hyperlipemia    Organic erectile dysfunction    PAF (paroxysmal atrial fibrillation) (HCC)    a. admx to [facility] in [place], [place] 95/62 with AF with RVR >> converted to NSR with IV Dilt;  b. Flecainide  started >> ETT neg for pro-arrhythmia    Pneumonia 06/2020   Prostate cancer (HCC) 04/25/2006   Gleason 3+4=7   Prostatitis    S/P cardiac cath    a. LHC at [facility] 10/16:  LM ok, mLAD 30%, LCx ok, dRCA 20%   S/P radiation therapy 01/19/2014 through 03/05/2014                                                      Prostate bed 6600 cGy in 33 sessions                           Torn rotator cuff    right  40% tear     Significant Hospital Events: Including procedures, antibiotic start and stop dates in addition to other pertinent events     Interim History / Subjective:  Still having intermittent fever A lot of dry cough  Objective   Blood pressure Aaron Aas)  118/96, pulse 69, temperature 98.9 F (37.2 C), resp. rate (!) 21, height 5\' 10"  (1.778 m), weight 100.6 kg, SpO2 95%.        Intake/Output Summary (Last 24 hours) at 07/15/2023 1406 Last data filed at 07/15/2023 0900 Gross per 24 hour  Intake 0 ml  Output 1450 ml  Net -1450 ml   Filed Weights   07/12/23 0200 07/13/23 1439  Weight: 97.2 kg 100.6 kg    Examination: General: Pleasant gentleman sitting up in bed and interacting appropriately.  Frequent dry cough whenever he speaks HENT: Cough as above, no stridor, no secretions.  Strong voice Lungs: Bilateral inspiratory crackles especially at both bases, louder on the right.  No wheezing Cardiovascular: Regular, no murmur Abdomen: Nondistended Extremities: No edema Neuro: Awake, alert, appropriate, nonfocal  Resolved Hospital Problem list      Assessment & Plan:  Bilateral pulmonary infiltrates, clinical history consistent with CAP or a viral pneumonia/pneumonitis.  Differential diagnosis could include  noninfectious inflammatory infiltrates such as cryptogenic organizing pneumonia, eosinophilic pneumonia but would consider this to be much less likely.  Not uncommon for infectious pneumonia to progress clinically and radiographically before improving with antibiotics. - Suspect that this may be viral given the persistent fevers, dry cough.  Agree with ceftriaxone  and doxycycline  as ordered.  If he shows clinical worsening on this antibiotic regimen then could consider broadening antibiotics, repeat sputum versus possibly even bronchoscopy to obtain cell count and differential for eosinophils, culture data.  He should be adequately covered on the current antibiotics if this is CAP. - Follow his chest x-ray.  Again if there is progression or if he is not clinically improving in the appropriate timeframe then would consider CT scan of his chest to better characterize his pulmonary infiltrates  Hyponatremia.  Consider possible hypovolemic hyponatremia given his poor p.o. intake at presentation although with low urine sodium and low serum osmolality more consistent with possible SIADH.  SIADH can be caused by viral or bacterial pneumonia.   -Treat the underlying pneumonia -Fluid restrict and follow his BMP -Sodium chloride  tablets were started - No role for concentrated saline at this time    Labs   CBC: Recent Labs  Lab 07/11/23 2150 07/12/23 0633 07/13/23 0338 07/15/23 0352  WBC 11.0* 11.3* 10.4 13.8*  NEUTROABS 8.3*  --   --   --   HGB 12.1* 11.8* 13.1 11.6*  HCT 34.3* 34.2* 37.1* 34.1*  MCV 89.6 91.7 91.6 92.9  PLT 131* 117* 116* 166    Basic Metabolic Panel: Recent Labs  Lab 07/11/23 2150 07/12/23 0633 07/12/23 1226 07/13/23 0338 07/13/23 1233 07/14/23 0356 07/14/23 1316 07/14/23 2059 07/15/23 0352 07/15/23 1338  NA 125* 125*   < > 124*   < > 127* 128* 123* 128* 126*  K 3.6 3.4*  --  3.8  --  4.0  --   --  3.5  --   CL 94* 97*  --  95*  --  96*  --   --  94*  --   CO2 21*  20*  --  20*  --  21*  --   --  24  --   GLUCOSE 157* 149*  --  114*  --  140*  --   --  139*  --   BUN 8 8  --  10  --  8  --   --  10  --   CREATININE 1.02 0.70  --  0.95  --  0.94  --   --  0.86  --   CALCIUM  8.8* 8.0*  --  7.6*  --  8.0*  --   --  8.2*  --   MG  --  1.9  --  1.8  --   --   --   --   --   --    < > = values in this interval not displayed.   GFR: Estimated Creatinine Clearance: 84.1 mL/min (by C-G formula based on SCr of 0.86 mg/dL). Recent Labs  Lab 07/11/23 2150 07/12/23 0633 07/13/23 0338 07/15/23 0352  WBC 11.0* 11.3* 10.4 13.8*  LATICACIDVEN 0.8  --   --   --     Liver Function Tests: Recent Labs  Lab 07/11/23 2150 07/12/23 0633 07/13/23 0338  AST 48* 56* 60*  ALT 24 28 29   ALKPHOS 66 55 50  BILITOT 0.9 1.3* 0.9  PROT 6.1* 6.0* 5.5*  ALBUMIN 3.8 3.3* 2.8*   No results for input(s): "LIPASE", "AMYLASE" in the last 168 hours. No results for input(s): "AMMONIA" in the last 168 hours.  ABG    Component Value Date/Time   HCO3 23.1 07/09/2020 2320   TCO2 24 07/09/2020 2320   O2SAT 90.0 07/09/2020 2320     Coagulation Profile: Recent Labs  Lab 07/11/23 2150  INR 1.2    Cardiac Enzymes: No results for input(s): "CKTOTAL", "CKMB", "CKMBINDEX", "TROPONINI" in the last 168 hours.  HbA1C: No results found for: "HGBA1C"  CBG: No results for input(s): "GLUCAP" in the last 168 hours.  Review of Systems:   As per HPI  Past Medical History:  He,  has a past medical history of Aortic atherosclerosis (HCC), Arthritis, Asthma, Diverticulitis, Diverticulosis, DVT (deep venous thrombosis) (HCC) (2019), Dysrhythmia, Ectatic thoracic aorta (HCC), GERD (gastroesophageal reflux disease), Hemorrhoids, History of echocardiogram, Hyperlipemia, Organic erectile dysfunction, PAF (paroxysmal atrial fibrillation) (HCC), Pneumonia (06/2020), Prostate cancer (HCC) (04/25/2006), Prostatitis, S/P cardiac cath, S/P radiation therapy (01/19/2014 through 03/05/2014                                                    ), and Torn rotator cuff.   Surgical History:   Past Surgical History:  Procedure Laterality Date   ABLATION OF DYSRHYTHMIC FOCUS  03/28/2018   APPENDECTOMY  1967   ATRIAL FIBRILLATION ABLATION N/A 03/28/2018   Procedure: ATRIAL FIBRILLATION ABLATION;  Surgeon: Jolly Needle, MD;  Location: [facility];  Service: Cardiovascular;  Laterality: N/A;   ATRIAL FIBRILLATION ABLATION N/A 11/11/2019   Procedure: ATRIAL FIBRILLATION ABLATION;  Surgeon: Jolly Needle, MD;  Location: [facility];  Service: Cardiovascular;  Laterality: N/A;   ATRIAL FIBRILLATION ABLATION N/A 12/14/2021   Procedure: ATRIAL FIBRILLATION ABLATION;  Surgeon: Lei Pump, MD;  Location: [facility];  Service: Cardiovascular;  Laterality: N/A;   BACK SURGERY  2019   CARDIAC CATHETERIZATION  2016   [place]   CATARACT EXTRACTION W/ INTRAOCULAR LENS IMPLANT Left 11/2011   CATARACT EXTRACTION W/ INTRAOCULAR LENS IMPLANT Right 09/2015   COLONOSCOPY     LARYNX SURGERY  2000   vocal cord lesion- benign   LUMBAR LAMINECTOMY/DECOMPRESSION MICRODISCECTOMY Right 07/26/2017   Procedure: RIGHT LUMBAR 3- LUMBAR 4 FORAMINOTOMY WITH RESECTION OF SYNOVIAL CYST;  Surgeon: Manya Sells, MD;  Location: [facility] OR;  Service: Neurosurgery;  Laterality: Right;  RIGHT LUMBAR 3- LUMBAR 4  FORAMINOTOMY WITH RESECTION OF SYNOVIAL CYST   MENISCUS REPAIR Right    POLYPECTOMY     PROSTATE BIOPSY  1999   PROSTATECTOMY  04/25/2006   REVERSE SHOULDER ARTHROPLASTY Left 09/02/2020   Procedure: REVERSE SHOULDER ARTHROPLASTY;  Surgeon: Ellard Gunning, MD;  Location: [facility];  Service: Orthopedics;  Laterality: Left;      Social History:   reports that he quit smoking about 53 years ago. His smoking use included cigarettes. He started smoking about 63 years ago. He has a 10 pack-year smoking history. He has never used smokeless tobacco. He reports that he does not currently use alcohol . He  reports that he does not use drugs.   Family History:  His family history includes Arthritis in his mother; Atrial fibrillation in his son; Diabetes in his maternal grandmother; Emphysema in his father; Heart disease in his maternal grandfather, mother, and paternal grandfather; Hypertension in his mother; Liver cancer in his paternal grandmother; Lung cancer in his father. There is no history of Stomach cancer, Rectal cancer, Esophageal cancer, or Colon cancer.   Allergies No Known Allergies   Home Medications  Prior to Admission medications   Medication Sig Start Date End Date Taking? Authorizing Provider  acetaminophen  (TYLENOL ) 500 MG tablet Take 500 mg by mouth at bedtime.   Yes [provider]  apixaban  (ELIQUIS ) 5 MG TABS tablet Take 5 mg by mouth 2 (two) times daily.    Yes [provider]  Ascorbic Acid  (VITAMIN C  PO) Take 500 mg by mouth daily at 12 noon.   Yes [provider]  atorvastatin  (LIPITOR) 20 MG tablet Take 20 mg by mouth at bedtime.   Yes [provider]  Azelastine  HCl 137 MCG/SPRAY SOLN Place 1 spray into both nostrils 2 (two) times daily.   Yes [provider]  cefdinir  (OMNICEF ) 300 MG capsule Take 300 mg by mouth 2 (two) times daily. 07/09/23 07/16/23 Yes [provider]  diltiazem  (CARDIZEM  CD) 180 MG 24 hr capsule Take 180 mg by mouth daily.   Yes [provider]  diltiazem  (CARDIZEM ) 30 MG tablet Take 1 tablet every 4 hours AS NEEDED for afib rapid heart rate over 100 Patient taking differently: Take 30 mg by mouth every 4 (four) hours as needed (afib rapid heart rate over 100). 02/02/23  Yes Fenton, Clint R, PA  dofetilide  (TIKOSYN ) 500 MCG capsule Take 1 capsule (500 mcg total) by mouth 2 (two) times daily. 02/14/23  Yes Fenton, Clint R, PA  EPINEPHrine  0.3 mg/0.3 mL IJ SOAJ injection Inject 0.3 mg into the muscle as needed for anaphylaxis. 04/06/19  Yes [provider]  fexofenadine (ALLEGRA  ALLERGY) 180 MG tablet Take 1 tablet by mouth daily. 08/17/21  Yes [provider]  fluticasone  (FLONASE ) 50 MCG/ACT nasal spray Place 1 spray into both nostrils every evening. 08/19/21  Yes [provider]  GLUCOSAMINE-CHONDROITIN PO Take 1,500 mg by mouth daily.   Yes [provider]  ipratropium (ATROVENT ) 0.06 % nasal spray Place 2 sprays into both nostrils at bedtime. 10/09/22  Yes [provider]  losartan  (COZAAR ) 50 MG tablet Take 25 mg by mouth daily. 10/09/22  Yes [provider]  Lysine  500 MG TABS Take 1 tablet by mouth daily at 12 noon.   Yes [provider]  Mometasone  Furoate (ASMANEX  HFA) 100 MCG/ACT AERO Inhale 1 puff into the lungs daily as needed (wheezing).   Yes [provider]  montelukast  (SINGULAIR ) 10 MG tablet Take 10  mg by mouth at bedtime.   Yes [provider]  Naphazoline-Pheniramine (OPCON-A ) 0.027-0.315 % SOLN Place 1 drop into both eyes as needed (eye allergies (red/irritated eyes)).   Yes [provider]  omeprazole (PRILOSEC) 20 MG capsule Take 20 mg by mouth 2 (two) times daily before a meal.    Yes [provider]  potassium chloride  (KLOR-CON  M10) 10 MEQ tablet Take 3 tablets by mouth daily Patient taking differently: Take 30 mEq by mouth daily. 04/30/23  Yes Fenton, Clint R, PA  Probiotic Product (ALIGN) 4 MG CAPS Take 4 mg by mouth every evening.   Yes [provider]  psyllium (METAMUCIL) 58.6 % powder Take 1 packet by mouth in the morning and at bedtime.   Yes [provider]  VENTOLIN  HFA 108 (90 Base) MCG/ACT inhaler Inhale 1-2 puffs into the lungs every 6 (six) hours as needed for wheezing or shortness of breath. 12/30/18  Yes [provider]  VITAMIN D  PO Take 1,000 Units by mouth daily.   Yes [provider]  zolpidem  (AMBIEN ) 10 MG tablet Take 10 mg by mouth at bedtime as needed. 07/09/23  Yes [provider]     Critical  care time: N/A      Racheal Buddle, MD, PhD 07/15/2023, 2:06 PM [facility] [phone] or if no answer before 7:00PM call [phone] For any issues after 7:00PM please call eLink [phone]

## 2023-07-16 DIAGNOSIS — E871 Hypo-osmolality and hyponatremia: Secondary | ICD-10-CM | POA: Diagnosis not present

## 2023-07-16 DIAGNOSIS — I480 Paroxysmal atrial fibrillation: Secondary | ICD-10-CM | POA: Diagnosis not present

## 2023-07-16 DIAGNOSIS — R918 Other nonspecific abnormal finding of lung field: Secondary | ICD-10-CM | POA: Diagnosis not present

## 2023-07-16 DIAGNOSIS — J189 Pneumonia, unspecified organism: Secondary | ICD-10-CM | POA: Diagnosis not present

## 2023-07-16 LAB — CBC
HCT: 31.6 % — ABNORMAL LOW (ref 39.0–52.0)
Hemoglobin: 10.9 g/dL — ABNORMAL LOW (ref 13.0–17.0)
MCH: 31.6 pg (ref 26.0–34.0)
MCHC: 34.5 g/dL (ref 30.0–36.0)
MCV: 91.6 fL (ref 80.0–100.0)
Platelets: 167 10*3/uL (ref 150–400)
RBC: 3.45 MIL/uL — ABNORMAL LOW (ref 4.22–5.81)
RDW: 11.9 % (ref 11.5–15.5)
WBC: 11 10*3/uL — ABNORMAL HIGH (ref 4.0–10.5)
nRBC: 0 % (ref 0.0–0.2)

## 2023-07-16 LAB — BASIC METABOLIC PANEL WITH GFR
Anion gap: 12 (ref 5–15)
BUN: 11 mg/dL (ref 8–23)
CO2: 18 mmol/L — ABNORMAL LOW (ref 22–32)
Calcium: 7.8 mg/dL — ABNORMAL LOW (ref 8.9–10.3)
Chloride: 98 mmol/L (ref 98–111)
Creatinine, Ser: 0.87 mg/dL (ref 0.61–1.24)
GFR, Estimated: >60 mL/min (ref >60)
Glucose, Bld: 120 mg/dL — ABNORMAL HIGH (ref 70–99)
Potassium: 4.1 mmol/L (ref 3.5–5.1)
Sodium: 128 mmol/L — ABNORMAL LOW (ref 135–145)

## 2023-07-16 LAB — TSH: TSH: 0.705 u[IU]/mL (ref 0.350–4.500)

## 2023-07-16 LAB — SODIUM, URINE, RANDOM: Sodium, Ur: 29 mmol/L

## 2023-07-16 LAB — SODIUM
Sodium: 128 mmol/L — ABNORMAL LOW (ref 135–145)
Sodium: 132 mmol/L — ABNORMAL LOW (ref 135–145)

## 2023-07-16 LAB — OSMOLALITY, URINE: Osmolality, Ur: 417 mosm/kg (ref 300–900)

## 2023-07-16 LAB — MAGNESIUM: Magnesium: 2.1 mg/dL (ref 1.7–2.4)

## 2023-07-16 LAB — CORTISOL: Cortisol, Plasma: 14.1 ug/dL

## 2023-07-16 MED ORDER — FUROSEMIDE 10 MG/ML IJ SOLN
40.0000 mg | Freq: Once | INTRAMUSCULAR | Status: AC
  Administered 2023-07-16: 40 mg via INTRAVENOUS
  Filled 2023-07-16: qty 4

## 2023-07-16 MED ORDER — LORAZEPAM 0.5 MG PO TABS
0.5000 mg | ORAL_TABLET | Freq: Four times a day (QID) | ORAL | Status: DC | PRN
  Administered 2023-07-16 – 2023-07-17 (×4): 0.5 mg via ORAL
  Filled 2023-07-16 (×4): qty 1

## 2023-07-16 NOTE — Consult Note (Signed)
 Reason for Consult: Hyponatremia Referring Physician:  Dr. 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  Urinary sodium was initially less than 10 a urine osmolality in the 400s.  BUN only 8-11 which may represent very poor oral intake; patient had a good Easter meal but since then has not been eating or drinking very much.  Patient is net -2.9 L during this hospitalization. -Will order a TSH, cortisol  - Significant lower extremity edema especially around the ankle and lower legs whic give another h is much worse than usual for him.  Would also order a echocardiogram to ensure that cardiac function has not worsened as this could be from cardiorenal syndrome as well. -Dose Lasix  40 mg x 1 today as well. -Continue sodium chloride  tablets for now; if no improvement by tomorrow will add Ure-Na. Mental status appears to be secondary to metabolic encephalopathy from infectious etiology. Paroxysmal atrial fibrillation on Eliquis  and dofetilide  Hypertension CAP on antibiotics.   HPI: Brandon Simmons. is an 78 y.o. male with a history of paroxysmal atrial fibrillation, hypertension, asthma, prostate cancer status post prostatectomy and XRT, remote history of DVT in 2019, hyperlipidemia presenting with fevers, cough, altered mental status and confusion.  Patient has had a 4-day history of cough, generalized weakness as well and a Tmax of 102 at home.  Of note people at discharge were sick with respiratory symptoms as well.  Patient did test negative for COVID and flu 2 days ago at his PCP.  Patient was noted to have a temperature of 101.6 with a blood pressure 132/66, white count of 11 and sodium level of 125.  Patient's BUN/creatinine was 8 and 1.  Chest x-ray showed bibasilar densities representing either atelectasis versus early infiltrates but CT of the head was negative for any acute abnormality.   Patient was treated with normal saline as well as antibiotics for community-acquired pneumonia.  ROS Pertinent items are noted in HPI.  Chemistry and CBC: Creatinine, Ser  Date/Time Value Ref Range Status  07/16/2023 03:24 AM 0.87 0.61 - 1.24 mg/dL Final  16/12/9602 54:09 AM 0.86 0.61 - 1.24 mg/dL Final  81/19/1478 29:56 AM 0.94 0.61 - 1.24 mg/dL Final  21/30/8657 84:69 AM 0.95 0.61 - 1.24 mg/dL Final  62/95/2841 32:44 AM 0.70 0.61 - 1.24 mg/dL Final  03/22/7251 66:44 PM 1.02 0.61 - 1.24 mg/dL Final  03/47/4259 56:38 AM 1.00 0.61 - 1.24 mg/dL Final  75/64/3329 51:88 AM 1.02 0.61 - 1.24 mg/dL Final  41/66/0630 16:01 AM 1.02 0.61 - 1.24 mg/dL Final  09/32/3557 32:20 AM 1.16 0.61 - 1.24 mg/dL Final  25/42/7062 37:62 AM 1.22 0.61 - 1.24 mg/dL Final  83/15/1761 60:73 AM 0.95 0.61 - 1.24 mg/dL Final  71/08/2692 85:46 PM 1.19 0.61 - 1.24 mg/dL Final  27/05/5007 38:18 AM 1.03 0.61 - 1.24 mg/dL Final  29/93/7169 67:89 PM 1.17 0.61 - 1.24 mg/dL Final  38/12/1749 02:58 AM 1.03 0.76 - 1.27 mg/dL Final  52/77/8242 35:36 AM 0.81 0.61 - 1.24 mg/dL Final  14/43/1540 08:67 AM 0.91 0.61 - 1.24 mg/dL Final  61/95/0932 67:12 AM 0.91 0.61 - 1.24 mg/dL Final  45/80/9983 38:25 PM 0.99 0.61 - 1.24 mg/dL Final  05/39/7673 41:93 PM 1.16 0.61 - 1.24 mg/dL Final  79/04/4095 35:32 PM 0.91 0.61 - 1.24 mg/dL Final  99/24/2683 41:96 PM 1.04 0.61 - 1.24 mg/dL Final  22/29/7989 21:19 AM 1.12 0.76 - 1.27 mg/dL Final  41/74/0814 48:18 PM  1.31 (H) 0.61 - 1.24 mg/dL Final  25/36/6440 34:74 AM 1.04 0.61 - 1.24 mg/dL Final  25/95/6387 56:43 PM 1.13 0.76 - 1.27 mg/dL Final  32/95/1884 16:60 AM 1.19 0.61 - 1.24 mg/dL Final  63/03/6008 93:23 PM 1.00 0.61 - 1.24 mg/dL Final  55/73/2202 54:27 AM 1.12 0.61 - 1.24 mg/dL Final   Recent Labs  Lab 07/11/23 2150 07/12/23 0633 07/12/23 1226 07/13/23 0338 07/13/23 1233 07/14/23 0356 07/14/23 1316 07/14/23 2059 07/15/23 0352 07/15/23 1338 07/15/23 2054 07/16/23 0324  07/16/23 1254  NA 125* 125*   < > 124*   < > 127* 128* 123* 128* 126* 127* 128* 128*  K 3.6 3.4*  --  3.8  --  4.0  --   --  3.5  --   --  4.1  --   CL 94* 97*  --  95*  --  96*  --   --  94*  --   --  98  --   CO2 21* 20*  --  20*  --  21*  --   --  24  --   --  18*  --   GLUCOSE 157* 149*  --  114*  --  140*  --   --  139*  --   --  120*  --   BUN 8 8  --  10  --  8  --   --  10  --   --  11  --   CREATININE 1.02 0.70  --  0.95  --  0.94  --   --  0.86  --   --  0.87  --   CALCIUM  8.8* 8.0*  --  7.6*  --  8.0*  --   --  8.2*  --   --  7.8*  --    < > = values in this interval not displayed.   Recent Labs  Lab 07/11/23 2150 07/12/23 0633 07/13/23 0338 07/15/23 0352 07/16/23 0324  WBC 11.0* 11.3* 10.4 13.8* 11.0*  NEUTROABS 8.3*  --   --   --   --   HGB 12.1* 11.8* 13.1 11.6* 10.9*  HCT 34.3* 34.2* 37.1* 34.1* 31.6*  MCV 89.6 91.7 91.6 92.9 91.6  PLT 131* 117* 116* 166 167   Liver Function Tests: Recent Labs  Lab 07/11/23 2150 07/12/23 0633 07/13/23 0338  AST 48* 56* 60*  ALT 24 28 29   ALKPHOS 66 55 50  BILITOT 0.9 1.3* 0.9  PROT 6.1* 6.0* 5.5*  ALBUMIN 3.8 3.3* 2.8*   No results for input(s): "LIPASE", "AMYLASE" in the last 168 hours. No results for input(s): "AMMONIA" in the last 168 hours. Cardiac Enzymes: No results for input(s): "CKTOTAL", "CKMB", "CKMBINDEX", "TROPONINI" in the last 168 hours. Iron Studies: No results for input(s): "IRON", "TIBC", "TRANSFERRIN", "FERRITIN" in the last 72 hours. PT/INR: @LABRCNTIP (inr:5)  Xrays/Other Studies: ) Results for orders placed or performed during the hospital encounter of 07/11/23 (from the past 48 hours)  Sodium     Status: Abnormal   Collection Time: 07/14/23  8:59 PM  Result Value Ref Range   Sodium 123 (L) 135 - 145 mmol/L    Comment: Performed at [facility], [address], [place], [place] [address]  Basic metabolic panel with GFR     Status: Abnormal   Collection Time: 07/15/23  3:52  AM  Result Value Ref Range   Sodium 128 (L) 135 - 145 mmol/L   Potassium 3.5 3.5 -  5.1 mmol/L   Chloride 94 (L) 98 - 111 mmol/L   CO2 24 22 - 32 mmol/L   Glucose, Bld 139 (H) 70 - 99 mg/dL    Comment: Glucose reference range applies only to samples taken after fasting for at least 8 hours.   BUN 10 8 - 23 mg/dL   Creatinine, Ser 9.60 0.61 - 1.24 mg/dL   Calcium  8.2 (L) 8.9 - 10.3 mg/dL   GFR, Estimated >45 >40 mL/min    Comment: (NOTE) Calculated using the CKD-EPI Creatinine Equation (2021)    Anion gap 10 5 - 15    Comment: Performed at [facility], [address] [place], [place] [address]  CBC     Status: Abnormal   Collection Time: 07/15/23  3:52 AM  Result Value Ref Range   WBC 13.8 (H) 4.0 - 10.5 K/uL   RBC 3.67 (L) 4.22 - 5.81 MIL/uL   Hemoglobin 11.6 (L) 13.0 - 17.0 g/dL   HCT 14.7 (L) 82.9 - 56.2 %   MCV 92.9 80.0 - 100.0 fL   MCH 31.6 26.0 - 34.0 pg   MCHC 34.0 30.0 - 36.0 g/dL   RDW 13.0 86.5 - 78.4 %   Platelets 166 150 - 400 K/uL   nRBC 0.0 0.0 - 0.2 %    Comment: Performed at [facility], [address], [place], [place] [address]  Osmolality, urine     Status: Abnormal   Collection Time: 07/15/23 10:30 AM  Result Value Ref Range   Osmolality, Ur 218 (L) 300 - 900 mOsm/kg    Comment: Performed at [facility], [address] [place], [place] [address]  Sodium, urine, random     Status: None   Collection Time: 07/15/23 10:30 AM  Result Value Ref Range   Sodium, Ur <10 mmol/L    Comment: Performed at [facility], [address] [place], [place] [address]  Sodium     Status: Abnormal   Collection Time: 07/15/23  1:38 PM  Result Value Ref Range   Sodium 126 (L) 135 - 145 mmol/L    Comment: Performed at [facility], [address] [place], [place] [address]  Sodium     Status: Abnormal   Collection Time: 07/15/23  8:54 PM  Result Value Ref Range   Sodium 127 (L) 135 - 145 mmol/L     Comment: Performed at [facility], [address], [place], [place] [address]  Basic metabolic panel with GFR     Status: Abnormal   Collection Time: 07/16/23  3:24 AM  Result Value Ref Range   Sodium 128 (L) 135 - 145 mmol/L   Potassium 4.1 3.5 - 5.1 mmol/L   Chloride 98 98 - 111 mmol/L   CO2 18 (L) 22 - 32 mmol/L   Glucose, Bld 120 (H) 70 - 99 mg/dL    Comment: Glucose reference range applies only to samples taken after fasting for at least 8 hours.   BUN 11 8 - 23 mg/dL   Creatinine, Ser 4.03 0.61 - 1.24 mg/dL   Calcium  7.8 (L) 8.9 - 10.3 mg/dL   GFR, Estimated >47 >42 mL/min    Comment: (NOTE) Calculated using the CKD-EPI Creatinine Equation (2021)    Anion gap 12 5 - 15    Comment: Performed at [facility], [address] [place], [place] [address]  Magnesium      Status: None   Collection Time:  07/16/23  3:24 AM  Result Value Ref Range   Magnesium  2.1 1.7 - 2.4 mg/dL    Comment: Performed at [facility], [address] [place], [place] [address]  CBC     Status: Abnormal   Collection Time: 07/16/23  3:24 AM  Result Value Ref Range   WBC 11.0 (H) 4.0 - 10.5 K/uL   RBC 3.45 (L) 4.22 - 5.81 MIL/uL   Hemoglobin 10.9 (L) 13.0 - 17.0 g/dL   HCT 51.8 (L) 84.1 - 66.0 %   MCV 91.6 80.0 - 100.0 fL   MCH 31.6 26.0 - 34.0 pg   MCHC 34.5 30.0 - 36.0 g/dL   RDW 63.0 16.0 - 10.9 %   Platelets 167 150 - 400 K/uL   nRBC 0.0 0.0 - 0.2 %    Comment: Performed at [facility], [address], [place], [place] [address]  Sodium, urine, random     Status: None   Collection Time: 07/16/23  4:46 AM  Result Value Ref Range   Sodium, Ur 29 mmol/L    Comment: Performed at [facility], [address], [place], [place] [address]  Osmolality, urine     Status: None   Collection Time: 07/16/23  4:46 AM  Result Value Ref Range   Osmolality, Ur 417 300 - 900 mOsm/kg    Comment: Performed at [facility], [address] [place], [place] [address]  Sodium     Status: Abnormal   Collection Time: 07/16/23 12:54 PM  Result Value Ref Range   Sodium 128 (L) 135 - 145 mmol/L    Comment: Performed at [facility], [address], [place], [place] [address]   DG CHEST PORT 1 VIEW Result Date: 07/15/2023 CLINICAL DATA:  Cough.  Shortness of breath.  Pneumonia. EXAM: PORTABLE CHEST 1 VIEW COMPARISON:  07/11/2023 FINDINGS: Left shoulder arthroplasty. Patient rotated right. Mild cardiomegaly. No pleural effusion or pneumothorax. New right upper lobe airspace disease. Increased bibasilar airspace disease. Relative sparing of the left upper lung. IMPRESSION: New and progressive multifocal pneumonia. Electronically Signed   By: Lore Rode M.D.   On: 07/15/2023 10:04    PMH:   Past Medical History:  Diagnosis Date   Aortic atherosclerosis (HCC)    Arthritis    Asthma    Diverticulitis    Diverticulosis    DVT (deep venous thrombosis) (HCC) 2019   right leg after back surgery   Dysrhythmia    A-fib every few weeks   Ectatic thoracic aorta (HCC)    a. 4.3cm by CT 03/2018.   GERD (gastroesophageal reflux disease)    Hemorrhoids    History of echocardiogram    a. Echo 10/16 [facility]):  EF 55%, trace MR, mild TR, mild to mod AI, mild dilated Ao root (41 mm)   Hyperlipemia    Organic erectile dysfunction    PAF (paroxysmal atrial fibrillation) (HCC)    a. admx to [facility] in [place], [place] 62/82 with AF with RVR >> converted to NSR with IV Dilt;  b. Flecainide  started >> ETT neg for pro-arrhythmia    Pneumonia 06/2020   Prostate cancer (HCC) 04/25/2006   Gleason 3+4=7   Prostatitis    S/P cardiac cath    a. LHC at [facility] 10/16:  LM ok, mLAD 30%, LCx ok, dRCA 20%   S/P radiation therapy 01/19/2014 through 03/05/2014  Prostate bed 6600 cGy in 33 sessions                           Torn rotator  cuff    right  40% tear    PSH:   Past Surgical History:  Procedure Laterality Date   ABLATION OF DYSRHYTHMIC FOCUS  03/28/2018   APPENDECTOMY  1967   ATRIAL FIBRILLATION ABLATION N/A 03/28/2018   Procedure: ATRIAL FIBRILLATION ABLATION;  Surgeon: Jolly Needle, MD;  Location: [facility] INVASIVE CV LAB;  Service: Cardiovascular;  Laterality: N/A;   ATRIAL FIBRILLATION ABLATION N/A 11/11/2019   Procedure: ATRIAL FIBRILLATION ABLATION;  Surgeon: Jolly Needle, MD;  Location: [facility] INVASIVE CV LAB;  Service: Cardiovascular;  Laterality: N/A;   ATRIAL FIBRILLATION ABLATION N/A 12/14/2021   Procedure: ATRIAL FIBRILLATION ABLATION;  Surgeon: Lei Pump, MD;  Location: [facility] INVASIVE CV LAB;  Service: Cardiovascular;  Laterality: N/A;   BACK SURGERY  2019   CARDIAC CATHETERIZATION  2016   [place]   CATARACT EXTRACTION W/ INTRAOCULAR LENS IMPLANT Left 11/2011   CATARACT EXTRACTION W/ INTRAOCULAR LENS IMPLANT Right 09/2015   COLONOSCOPY     LARYNX SURGERY  2000   vocal cord lesion- benign   LUMBAR LAMINECTOMY/DECOMPRESSION MICRODISCECTOMY Right 07/26/2017   Procedure: RIGHT LUMBAR 3- LUMBAR 4 FORAMINOTOMY WITH RESECTION OF SYNOVIAL CYST;  Surgeon: Manya Sells, MD;  Location[facility];  Service: Neurosurgery;  Laterality: Right;  RIGHT LUMBAR 3- LUMBAR 4 FORAMINOTOMY WITH RESECTION OF SYNOVIAL CYST   MENISCUS REPAIR Right    POLYPECTOMY     PROSTATE BIOPSY  1999   PROSTATECTOMY  04/25/2006   REVERSE SHOULDER ARTHROPLASTY Left 09/02/2020   Procedure: REVERSE SHOULDER ARTHROPLASTY;  Surgeon: Ellard Gunning, MD;  Location: [facility] ORS;  Service: Orthopedics;  Laterality: Left;     Allergies: No Known Allergies  Medications:   Prior to Admission medications   Medication Sig Start Date End Date Taking? Authorizing Provider  acetaminophen  (TYLENOL ) 500 MG tablet Take 500 mg by mouth at bedtime.   Yes [provider]  apixaban  (ELIQUIS ) 5 MG TABS tablet Take 5 mg by mouth 2 (two) times daily.     Yes [provider]  Ascorbic Acid  (VITAMIN C  PO) Take 500 mg by mouth daily at 12 noon.   Yes [provider]  atorvastatin  (LIPITOR) 20 MG tablet Take 20 mg by mouth at bedtime.   Yes [provider]  Azelastine  HCl 137 MCG/SPRAY SOLN Place 1 spray into both nostrils 2 (two) times daily.   Yes [provider]  cefdinir  (OMNICEF ) 300 MG capsule Take 300 mg by mouth 2 (two) times daily. 07/09/23 07/16/23 Yes [provider]  diltiazem  (CARDIZEM  CD) 180 MG 24 hr capsule Take 180 mg by mouth daily.   Yes [provider]  diltiazem  (CARDIZEM ) 30 MG tablet Take 1 tablet every 4 hours AS NEEDED for afib rapid heart rate over 100 Patient taking differently: Take 30 mg by mouth every 4 (four) hours as needed (afib rapid heart rate over 100). 02/02/23  Yes Fenton, Clint R, PA  dofetilide  (TIKOSYN ) 500 MCG capsule Take 1 capsule (500 mcg total) by mouth 2 (two) times daily. 02/14/23  Yes Fenton, Clint R, PA  EPINEPHrine  0.3 mg/0.3 mL IJ SOAJ injection Inject 0.3 mg into the muscle as needed for anaphylaxis. 04/06/19  Yes [provider]  fexofenadine (ALLEGRA ALLERGY) 180 MG tablet Take 1 tablet by mouth daily. 08/17/21  Yes  [provider]  fluticasone  (FLONASE ) 50 MCG/ACT nasal spray Place 1 spray into both nostrils every evening. 08/19/21  Yes [provider]  GLUCOSAMINE-CHONDROITIN PO Take 1,500 mg by mouth daily.   Yes [provider]  ipratropium (ATROVENT ) 0.06 % nasal spray Place 2 sprays into both nostrils at bedtime. 10/09/22  Yes [provider]  losartan  (COZAAR ) 50 MG tablet Take 25 mg by mouth daily. 10/09/22  Yes [provider]  Lysine  500 MG TABS Take 1 tablet by mouth daily at 12 noon.   Yes [provider]  Mometasone  Furoate (ASMANEX  HFA) 100 MCG/ACT AERO Inhale 1 puff into the lungs daily as needed (wheezing).   Yes [provider]  montelukast  (SINGULAIR ) 10 MG tablet  Take 10 mg by mouth at bedtime.   Yes [provider]  Naphazoline-Pheniramine (OPCON-A ) 0.027-0.315 % SOLN Place 1 drop into both eyes as needed (eye allergies (red/irritated eyes)).   Yes [provider]  omeprazole (PRILOSEC) 20 MG capsule Take 20 mg by mouth 2 (two) times daily before a meal.    Yes [provider]  potassium chloride  (KLOR-CON  M10) 10 MEQ tablet Take 3 tablets by mouth daily Patient taking differently: Take 30 mEq by mouth daily. 04/30/23  Yes Fenton, Clint R, PA  Probiotic Product (ALIGN) 4 MG CAPS Take 4 mg by mouth every evening.   Yes [provider]  psyllium (METAMUCIL) 58.6 % powder Take 1 packet by mouth in the morning and at bedtime.   Yes [provider]  VENTOLIN  HFA 108 (90 Base) MCG/ACT inhaler Inhale 1-2 puffs into the lungs every 6 (six) hours as needed for wheezing or shortness of breath. 12/30/18  Yes [provider]  VITAMIN D  PO Take 1,000 Units by mouth daily.   Yes [provider]  zolpidem  (AMBIEN ) 10 MG tablet Take 10 mg by mouth at bedtime as needed. 07/09/23  Yes [provider]    Discontinued Meds:   Medications Discontinued During This Encounter  Medication Reason   albuterol  (PROVENTIL ) (2.5 MG/3ML) 0.083% nebulizer solution 2.5 mg    guaiFENesin -dextromethorphan  (ROBITUSSIN DM) 100-10 MG/5ML syrup 10 mL    dofetilide  (TIKOSYN ) capsule 500 mcg    apixaban  (ELIQUIS ) tablet 5 mg    Multiple Vitamin (MULTIVITAMIN ADULT PO)    cefTRIAXone  (ROCEPHIN ) 1 g in sodium chloride  0.9 % 100 mL IVPB    doxycycline  (VIBRAMYCIN ) 100 mg in sodium chloride  0.9 % 250 mL IVPB    guaiFENesin -codeine  100-10 MG/5ML solution 5 mL    0.9 %  sodium chloride  infusion    chlorpheniramine-HYDROcodone  (TUSSIONEX) 10-8 MG/5ML suspension 5 mL    0.9 %  sodium chloride  infusion    doxycycline  (VIBRAMYCIN ) 100 mg in sodium chloride  0.9 % 250 mL IVPB P&T Policy: IV to PO Conversion   potassium chloride  SA  (KLOR-CON  M) CR tablet 40 mEq    chlorpheniramine-HYDROcodone  (TUSSIONEX) 10-8 MG/5ML suspension 5 mL    hydrOXYzine (ATARAX) tablet 25 mg     Social History:  reports that he quit smoking about 53 years ago. His smoking use included cigarettes. He started smoking about 63 years ago. He has a 10 pack-year smoking history. He has never used smokeless tobacco. He reports that he does not currently use alcohol . He reports that he does not use drugs.  Family History:   Family History  Problem Relation Age of Onset   Emphysema Father    Lung cancer Father    Hypertension Mother  Heart disease Mother        heart attack   Arthritis Mother    Diabetes Maternal Grandmother    Heart disease Maternal Grandfather        Died age 6   Liver cancer Paternal Grandmother    Heart disease Paternal Grandfather        Died age 60   Atrial fibrillation Son    Stomach cancer Neg Hx    Rectal cancer Neg Hx    Esophageal cancer Neg Hx    Colon cancer Neg Hx     Blood pressure (!) 135/58, pulse 61, temperature 98.4 F (36.9 C), temperature source Oral, resp. rate 16, height 5\' 10"  (1.778 m), weight 98.6 kg, SpO2 98%. General appearance: alert, cooperative, and appears stated age Head: Normocephalic, without obvious abnormality, atraumatic Eyes: negative Neck: no adenopathy, no carotid bruit, supple, symmetrical, trachea midline, and thyroid  not enlarged, symmetric, no tenderness/mass/nodules Back: symmetric, no curvature. ROM normal. No CVA tenderness. Resp: rhonchi bibasilar Cardio: irregularly irregular rhythm GI: soft, non-tender; bowel sounds normal; no masses,  no organomegaly Extremities: edema 1+ lower ext and ankle R>L Pulses: 2+ and symmetric Skin: Skin color, texture, turgor normal. No rashes or lesions       Donaldson Richter, Alveda Aures, MD 07/16/2023, 3:17 PM

## 2023-07-16 NOTE — Progress Notes (Signed)
 PT Cancellation Note  Patient Details Name: Brandon Simmons. MRN: [id number] DOB: Apr 02, 1945   Cancelled Treatment:    Reason Eval/Treat Not Completed: Other (comment) (pt reports he's ambulating the full unit with a rollator without difficulty, he recently walked so is not up for another walk. He is mobilizing at a modified independent level. PT signing off. Mobility team to follow.)   Daymon Evans PT 07/16/2023  Acute Rehabilitation Services  Office [phone]

## 2023-07-16 NOTE — Progress Notes (Signed)
 PROGRESS NOTE    Brandon Simmons.  ZOX:[id number] DOB: Nov 24, 1945 DOA: 07/11/2023 PCP: Bertha Broad, MD   Brief Narrative: Brandon Simmons. is a 78 y.o. male with a history of proximal atrial fibrillation, hypertension, asthma, prostate cancer status post prostatectomy and radiation therapy, DVT, GERD, hyperlipidemia.  Patient presented secondary to fever, cough, and altered mental status/confusion with evidence of community-acquired pneumonia.  Empiric antibiotics started.  Blood cultures ordered and are pending.   Assessment and Plan:  Community-acquired pneumonia Patient with fever and leukocytosis. Chest x-ray with possible bibasilar early infiltrates. Blood cultures obtained. Empiric Ceftriaxone  and azithromycin  started. Sputum culture significant for GNR. Febrile overnight. Repeat chest x-ray obtained with evidence of progressive multifocal infiltrates. -Continue Azithromycin  and Ceftriaxone  -Follow-up blood cultures -Pulmonology recommendations: continue current plan; considering CT  Hyponatremia Sodium of 125 on admission. Unclear etiology but presumed secondary to hypovolemia. Serum osmolality of 270. Urine sodium undetectably low with urine osmolality of 421. Managed initially with IV fluids with stagnant sodium. Discussed with nephrology who recommended to stop IV fluids and start salt tablets for possible volume depletion SAIDH combination. Repeat urine sodium/osmolality <10/176. Sodium improved to 128 but mostly stable. Urine sodium increased after Lasix . -Salt tablets 1 g TID -Nephrology consult -BMP daily  Hypokalemia -Potassium supplementation to keep potassium >4  Acute metabolic encephalopathy Presumed secondary to pneumonia. Hyponatremia likely not bad enough to contribute. CT head negative for acute process. Resolved.  Normocytic anemia Mild. Resolved.  Chronic thrombocytopenia Unclear etiology. Platelets of 131 on admission, down to 117  today. Likely reactive related to acute illness.  Paroxysmal atrial fibrillation Patient is on Eliquis  and dofetilide  as an outpatient. From last cardiology note, diltiazem  180 mg daily discontinued secondary to bradycardia. -Continue Eliquis  -Continue dofetilide  -Keep potassium greater than 4 and magnesium  greater than 2  Asthma Stable.  Primary hypertension Patient is on losartan  and diltiazem  as an outpatient.  GERD Patient is on omeprazole as an outpatient. -Protonix  while inpatient  Hyperlipidemia -Continue Lipitor   DVT prophylaxis: Eliquis  Code Status:   Code Status: Full Code Family Communication: Wife at bedside Disposition Plan: Discharge home likely in 1-2 days pending improved sodium and if remaining afebrile in addition to pulmonology and nephrology recommendations/management   Consultants:  Pulmonology Nephrology  Procedures:  None  Antimicrobials: Ceftriaxone  Doxycycline     Subjective: Afebrile overnight. Continued cough. Continues to feel unwell.  Objective: BP (!) 108/93 (BP Location: Left Arm)   Pulse 80   Temp 99.3 F (37.4 C)   Resp 17   Ht 5\' 10"  (1.778 m)   Wt 98.6 kg   SpO2 94%   BMI 31.18 kg/m   Examination:  General exam: Appears calm and comfortable Respiratory system: Bilateral rales. Respiratory effort normal. Cardiovascular system: S1 & S2 heard, RRR. No murmurs. Gastrointestinal system: Abdomen is nondistended, soft and nontender. Normal bowel sounds heard. Central nervous system: Alert and oriented. No focal neurological deficits. Musculoskeletal: Trace BLE edema. No calf tenderness Psychiatry: Judgement and insight appear normal. Mood & affect appropriate.    Data Reviewed: I have personally reviewed following labs and imaging studies  CBC Lab Results  Component Value Date   WBC 11.0 (H) 07/16/2023   RBC 3.45 (L) 07/16/2023   HGB 10.9 (L) 07/16/2023   HCT 31.6 (L) 07/16/2023   MCV 91.6 07/16/2023   MCH 31.6  07/16/2023   PLT 167 07/16/2023   MCHC 34.5 07/16/2023   RDW 11.9 07/16/2023   LYMPHSABS 1.3 07/11/2023  MONOABS 1.3 (H) 07/11/2023   EOSABS 0.0 07/11/2023   BASOSABS 0.1 07/11/2023     Last metabolic panel Lab Results  Component Value Date   NA 128 (L) 07/16/2023   K 4.1 07/16/2023   CL 98 07/16/2023   CO2 18 (L) 07/16/2023   BUN 11 07/16/2023   CREATININE 0.87 07/16/2023   GLUCOSE 120 (H) 07/16/2023   GFRNONAA >60 07/16/2023   GFRAA 74 11/03/2019   CALCIUM  7.8 (L) 07/16/2023   PHOS 2.8 11/29/2021   PROT 5.5 (L) 07/13/2023   ALBUMIN 2.8 (L) 07/13/2023   BILITOT 0.9 07/13/2023   ALKPHOS 50 07/13/2023   AST 60 (H) 07/13/2023   ALT 29 07/13/2023   ANIONGAP 12 07/16/2023    GFR: Estimated Creatinine Clearance: 82.3 mL/min (by C-G formula based on SCr of 0.87 mg/dL).  Recent Results (from the past 240 hours)  Culture, blood (Routine x 2)     Status: None (Preliminary result)   Collection Time: 07/11/23  9:51 PM   Specimen: BLOOD  Result Value Ref Range Status   Specimen Description   Final    BLOOD RIGHT ANTECUBITAL Performed at [facility], [address], [place], [place] [address]    Special Requests   Final    BOTTLES DRAWN AEROBIC AND ANAEROBIC Blood Culture adequate volume Performed at [facility], [address] [place], [place] [address]    Culture   Final    NO GROWTH 4 DAYS Performed at [facility], [address] [place], [place] [address]    Report Status PENDING  Incomplete  Culture, blood (Routine x 2)     Status: None (Preliminary result)   Collection Time: 07/11/23  9:51 PM   Specimen: BLOOD RIGHT FOREARM  Result Value Ref Range Status   Specimen Description   Final    BLOOD RIGHT FOREARM Performed at [facility], [address], [place], [place] [address]    Special Requests   Final    BOTTLES DRAWN AEROBIC AND ANAEROBIC Blood Culture adequate volume Performed at [facility], [address] [place], [place] [address]    Culture   Final    NO GROWTH 4 DAYS Performed at [facility], [address] [place], [place] [address]    Report Status PENDING  Incomplete  MRSA Next Gen by PCR, Nasal     Status: None   Collection Time: 07/12/23  6:03 AM   Specimen: Anterior Nasal Swab  Result Value Ref Range Status   MRSA by PCR Next Gen NOT DETECTED NOT DETECTED Final    Comment: (NOTE) The GeneXpert MRSA Assay (FDA approved for NASAL specimens only), is one component of a comprehensive MRSA colonization surveillance program. It is not intended to diagnose MRSA infection nor to guide or monitor treatment for MRSA infections. Test performance is not FDA approved in patients less than 62 years old. Performed at [facility], [address] [place], [place] [address]   Resp panel by RT-PCR (RSV, Flu A&B, Covid) Anterior Nasal Swab     Status: None   Collection Time: 07/12/23  6:04 AM   Specimen: Anterior Nasal Swab  Result Value Ref Range Status   SARS Coronavirus 2 by RT PCR NEGATIVE NEGATIVE Final    Comment: (NOTE) SARS-CoV-2 target nucleic acids are NOT DETECTED.  The SARS-CoV-2 RNA is generally detectable in upper respiratory specimens during the acute phase of infection. The lowest concentration of SARS-CoV-2 viral copies  this assay can detect is 138 copies/mL. A negative result does not preclude SARS-Cov-2 infection and should not be used as the sole basis for treatment or other patient management decisions. A negative result may occur with  improper specimen collection/handling, submission of specimen other than nasopharyngeal swab, presence of viral mutation(s) within the areas targeted by this assay, and inadequate number of viral copies(<138 copies/mL). A negative result must be combined with clinical observations, patient history, and epidemiological information. The expected result is  Negative.  Fact Sheet for Patients:  BloggerCourse.com  Fact Sheet for Healthcare Providers:  SeriousBroker.it  This test is no t yet approved or cleared by the [place]  FDA and  has been authorized for detection and/or diagnosis of SARS-CoV-2 by FDA under an Emergency Use Authorization (EUA). This EUA will remain  in effect (meaning this test can be used) for the duration of the COVID-19 declaration under Section 564(b)(1) of the Act, 21 U.S.C.section 360bbb-3(b)(1), unless the authorization is terminated  or revoked sooner.       Influenza A by PCR NEGATIVE NEGATIVE Final   Influenza B by PCR NEGATIVE NEGATIVE Final    Comment: (NOTE) The Xpert Xpress SARS-CoV-2/FLU/RSV plus assay is intended as an aid in the diagnosis of influenza from Nasopharyngeal swab specimens and should not be used as a sole basis for treatment. Nasal washings and aspirates are unacceptable for Xpert Xpress SARS-CoV-2/FLU/RSV testing.  Fact Sheet for Patients: BloggerCourse.com  Fact Sheet for Healthcare Providers: SeriousBroker.it  This test is not yet approved or cleared by the [place]  FDA and has been authorized for detection and/or diagnosis of SARS-CoV-2 by FDA under an Emergency Use Authorization (EUA). This EUA will remain in effect (meaning this test can be used) for the duration of the COVID-19 declaration under Section 564(b)(1) of the Act, 21 U.S.C. section 360bbb-3(b)(1), unless the authorization is terminated or revoked.     Resp Syncytial Virus by PCR NEGATIVE NEGATIVE Final    Comment: (NOTE) Fact Sheet for Patients: BloggerCourse.com  Fact Sheet for Healthcare Providers: SeriousBroker.it  This test is not yet approved or cleared by the [place]  FDA and has been authorized for detection and/or diagnosis of  SARS-CoV-2 by FDA under an Emergency Use Authorization (EUA). This EUA will remain in effect (meaning this test can be used) for the duration of the COVID-19 declaration under Section 564(b)(1) of the Act, 21 U.S.C. section 360bbb-3(b)(1), unless the authorization is terminated or revoked.  Performed at [facility], [address], [place], [place] [address]   Expectorated Sputum Assessment w Gram Stain, Rflx to Resp Cult     Status: None   Collection Time: 07/12/23 11:51 AM   Specimen: Sputum  Result Value Ref Range Status   Specimen Description SPUTUM  Final   Special Requests NONE  Final   Sputum evaluation   Final    THIS SPECIMEN IS ACCEPTABLE FOR SPUTUM CULTURE Performed at [facility], [address] [place], [place] [address]    Report Status 07/12/2023 FINAL  Final  Culture, Respiratory w Gram Stain     Status: None   Collection Time: 07/12/23 11:51 AM   Specimen: SPU  Result Value Ref Range Status   Specimen Description   Final    SPUTUM Performed at [facility], [address] [place], [place] [address]    Special Requests   Final    NONE Reflexed from [id number] Performed at [facility], [address] [place], [place] [address]    Gram Stain   Final    ABUNDANT WBC PRESENT, PREDOMINANTLY PMN RARE GRAM NEGATIVE RODS    Culture   Final    FEW Normal respiratory flora-no Staph aureus or Pseudomonas seen Performed at [facility], [address] [place], [place] [address]    Report Status 07/14/2023 FINAL  Final      Radiology Studies: DG CHEST PORT 1 VIEW Result Date: 07/15/2023 CLINICAL DATA:  Cough.  Shortness of breath.  Pneumonia. EXAM: PORTABLE CHEST 1 VIEW COMPARISON:  07/11/2023 FINDINGS: Left shoulder arthroplasty. Patient rotated right. Mild cardiomegaly. No pleural effusion or pneumothorax. New right upper lobe airspace disease. Increased bibasilar airspace disease.  Relative sparing of the left upper lung. IMPRESSION: New and progressive multifocal pneumonia. Electronically Signed   By: Lore Rode M.D.   On: 07/15/2023 10:04       LOS: 4 days    Aneita Keens, MD [facility] 07/16/2023, 9:38 AM   If 7PM-7AM, please contact night-coverage [email or web address]

## 2023-07-16 NOTE — Plan of Care (Signed)

## 2023-07-16 NOTE — Progress Notes (Signed)
 NAME:  Brandon Simmons., MRN:  [id number], DOB:  1946/03/11, LOS: 4 ADMISSION DATE:  07/11/2023, CONSULTATION DATE: 07/15/2023 REFERRING MD: Dr. Duard Getting, CHIEF COMPLAINT: Pneumonia  History of Present Illness:  78 year old man with paroxysmal atrial fibrillation on dofetilide , Eliquis , hypertension, prostate cancer with a prostatectomy and radiation therapy, DVT in 2019, GERD and hyperlipidemia, COVID-19 in 2022.  Per chart notes he also had right sided community-acquired pneumonia in 10/2011.  He has been followed by Dr. Phill Brazil and also by Dr. Waylan Haggard for allergic rhinitis and asthma.   He was admitted 07/11/2023 with 4 days of cough, fever and subtle right greater than left pulmonary infiltrates concerning for possible pneumonia.  He did have a sick contact several days prior.  COVID and flu were negative at his PCPs office 2 days prior to admission, and then again on 4/24 at admission.  He was started on ceftriaxone  and azithromycin .  Labs revealed mild leukocytosis without left shift or eosinophilia, hyponatremia of unclear cause, urine sodium < 10 and serum osm 270, and he did report poor p.o. intake in the days prior to admission.  He has continued to have fever on antibiotics.  Sputum 4/24 showed abundant PMN, rare GNR but grew out normal flora.  Legionella and pneumococcal antigens were both negative.  Repeat chest x-ray done 4/27 showed some progression of his bilateral right greater than left airspace disease.  Pertinent  Medical History   Past Medical History:  Diagnosis Date   Aortic atherosclerosis (HCC)    Arthritis    Asthma    Diverticulitis    Diverticulosis    DVT (deep venous thrombosis) (HCC) 2019   right leg after back surgery   Dysrhythmia    A-fib every few weeks   Ectatic thoracic aorta (HCC)    a. 4.3cm by CT 03/2018.   GERD (gastroesophageal reflux disease)    Hemorrhoids    History of echocardiogram    a. Echo 10/16 [facility]):  EF 55%, trace MR, mild  TR, mild to mod AI, mild dilated Ao root (41 mm)   Hyperlipemia    Organic erectile dysfunction    PAF (paroxysmal atrial fibrillation) (HCC)    a. admx to [facility] in [place], [place] 40/10 with AF with RVR >> converted to NSR with IV Dilt;  b. Flecainide  started >> ETT neg for pro-arrhythmia    Pneumonia 06/2020   Prostate cancer (HCC) 04/25/2006   Gleason 3+4=7   Prostatitis    S/P cardiac cath    a. LHC at [facility] 10/16:  LM ok, mLAD 30%, LCx ok, dRCA 20%   S/P radiation therapy 01/19/2014 through 03/05/2014                                                      Prostate bed 6600 cGy in 33 sessions                           Torn rotator cuff    right  40% tear     Significant Hospital Events: Including procedures, antibiotic start and stop dates in addition to other pertinent events     Interim History / Subjective:   Continues to complain of cough Afebrile Saturation 94 to 98% on room air  complains of  bipedal edema , diuresed more than 3 L with Lasix   Objective   Blood pressure (!) 135/58, pulse 61, temperature 98.4 F (36.9 C), temperature source Oral, resp. rate 16, height 5\' 10"  (1.778 m), weight 98.6 kg, SpO2 98%.        Intake/Output Summary (Last 24 hours) at 07/16/2023 1627 Last data filed at 07/16/2023 0550 Gross per 24 hour  Intake 160 ml  Output 2700 ml  Net -2540 ml   Filed Weights   07/12/23 0200 07/13/23 1439 07/16/23 0500  Weight: 97.2 kg 100.6 kg 98.6 kg    Examination: General: Pleasant gentleman sitting up in bed and interacting appropriately.  Frequent dry cough whenever he speaks HENT:no stridor, no secretions.  Strong voice Lungs: Right basilar crackles, no accessory muscle use, clear on left Cardiovascular: Regular, no murmur Abdomen: Nondistended Extremities: No edema Neuro: Alert interactive, nonfocal  Labs show hyponatremia 128, decreased leukocytosis  Chest x-ray 4/27 showed bilateral worsening infiltrates, no  effusion  Resolved Hospital Problem list      Assessment & Plan:  Bilateral pulmonary infiltrates, clinical history consistent with CAP or a viral pneumonia/pneumonitis.  Differential diagnosis could include noninfectious inflammatory infiltrates such as cryptogenic organizing pneumonia, eosinophilic pneumonia but would consider this to be much less likely.  Not uncommon for infectious pneumonia to progress clinically and radiographically before improving with antibiotics. - Suspect that this may be viral given the persistent fevers, dry cough.  At the new ceftriaxone  and doxycycline  as ordered.  If he shows clinical worsening on this antibiotic regimen then could consider broadening antibiotics, repeat sputum versus possibly even bronchoscopy to obtain cell count and differential for eosinophils, culture data. - Proceed with CT chest without contrast  Bipedal edema Hyponatremia. -Currently hypervolemic, possible SIADH.  SIADH can be caused by viral or bacterial pneumonia.  Urine sodium initially was less than 10 -Fluid restrict and follow his BMP -Sodium chloride  tablets were started - Diuresing with Lasix  - Repeat echo planned, prior gated EF was normal    Labs   CBC: Recent Labs  Lab 07/11/23 2150 07/12/23 0633 07/13/23 0338 07/15/23 0352 07/16/23 0324  WBC 11.0* 11.3* 10.4 13.8* 11.0*  NEUTROABS 8.3*  --   --   --   --   HGB 12.1* 11.8* 13.1 11.6* 10.9*  HCT 34.3* 34.2* 37.1* 34.1* 31.6*  MCV 89.6 91.7 91.6 92.9 91.6  PLT 131* 117* 116* 166 167    Basic Metabolic Panel: Recent Labs  Lab 07/12/23 0633 07/12/23 1226 07/13/23 0338 07/13/23 1233 07/14/23 0356 07/14/23 1316 07/15/23 0352 07/15/23 1338 07/15/23 2054 07/16/23 0324 07/16/23 1254  NA 125*   < > 124*   < > 127*   < > 128* 126* 127* 128* 128*  K 3.4*  --  3.8  --  4.0  --  3.5  --   --  4.1  --   CL 97*  --  95*  --  96*  --  94*  --   --  98  --   CO2 20*  --  20*  --  21*  --  24  --   --  18*  --    GLUCOSE 149*  --  114*  --  140*  --  139*  --   --  120*  --   BUN 8  --  10  --  8  --  10  --   --  11  --   CREATININE 0.70  --  0.95  --  0.94  --  0.86  --   --  0.87  --   CALCIUM  8.0*  --  7.6*  --  8.0*  --  8.2*  --   --  7.8*  --   MG 1.9  --  1.8  --   --   --   --   --   --  2.1  --    < > = values in this interval not displayed.   GFR: Estimated Creatinine Clearance: 82.3 mL/min (by C-G formula based on SCr of 0.87 mg/dL). Recent Labs  Lab 07/11/23 2150 07/12/23 0633 07/13/23 0338 07/15/23 0352 07/16/23 0324  WBC 11.0* 11.3* 10.4 13.8* 11.0*  LATICACIDVEN 0.8  --   --   --   --     Liver Function Tests: Recent Labs  Lab 07/11/23 2150 07/12/23 0633 07/13/23 0338  AST 48* 56* 60*  ALT 24 28 29   ALKPHOS 66 55 50  BILITOT 0.9 1.3* 0.9  PROT 6.1* 6.0* 5.5*  ALBUMIN 3.8 3.3* 2.8*   No results for input(s): "LIPASE", "AMYLASE" in the last 168 hours. No results for input(s): "AMMONIA" in the last 168 hours.  ABG    Component Value Date/Time   HCO3 23.1 07/09/2020 2320   TCO2 24 07/09/2020 2320   O2SAT 90.0 07/09/2020 2320     Coagulation Profile: Recent Labs  Lab 07/11/23 2150  INR 1.2    Cardiac Enzymes: No results for input(s): "CKTOTAL", "CKMB", "CKMBINDEX", "TROPONINI" in the last 168 hours.  HbA1C: No results found for: "HGBA1C"  CBG: No results for input(s): "GLUCAP" in the last 168 hours.  Jennifer Moellers Villa Greaser MD 07/16/2023, 4:27 PM [facility] [phone] or if no answer before 7:00PM call [phone] For any issues after 7:00PM please call eLink [phone]

## 2023-07-17 ENCOUNTER — Other Ambulatory Visit (HOSPITAL_COMMUNITY): Payer: Self-pay

## 2023-07-17 ENCOUNTER — Inpatient Hospital Stay (HOSPITAL_COMMUNITY)

## 2023-07-17 ENCOUNTER — Encounter: Payer: Self-pay | Admitting: Cardiology

## 2023-07-17 DIAGNOSIS — J189 Pneumonia, unspecified organism: Secondary | ICD-10-CM | POA: Diagnosis not present

## 2023-07-17 DIAGNOSIS — I5032 Chronic diastolic (congestive) heart failure: Secondary | ICD-10-CM | POA: Diagnosis not present

## 2023-07-17 LAB — ECHOCARDIOGRAM COMPLETE
AR max vel: 2.8 cm2
AV Area VTI: 2.37 cm2
AV Area mean vel: 2.46 cm2
AV Mean grad: 6 mmHg
AV Peak grad: 10.6 mmHg
Ao pk vel: 1.63 m/s
Area-P 1/2: 2.8 cm2
Calc EF: 62.5 %
Height: 70 in
P 1/2 time: 649 ms
S' Lateral: 3.8 cm
Single Plane A2C EF: 67.2 %
Single Plane A4C EF: 56.1 %
Weight: 3407.43 [oz_av]

## 2023-07-17 LAB — CULTURE, BLOOD (ROUTINE X 2)
Culture: NO GROWTH
Culture: NO GROWTH
Special Requests: ADEQUATE
Special Requests: ADEQUATE

## 2023-07-17 LAB — BASIC METABOLIC PANEL WITH GFR
Anion gap: 8 (ref 5–15)
BUN: 14 mg/dL (ref 8–23)
CO2: 26 mmol/L (ref 22–32)
Calcium: 8.4 mg/dL — ABNORMAL LOW (ref 8.9–10.3)
Chloride: 99 mmol/L (ref 98–111)
Creatinine, Ser: 0.97 mg/dL (ref 0.61–1.24)
GFR, Estimated: >60 mL/min (ref >60)
Glucose, Bld: 133 mg/dL — ABNORMAL HIGH (ref 70–99)
Potassium: 3.7 mmol/L (ref 3.5–5.1)
Sodium: 133 mmol/L — ABNORMAL LOW (ref 135–145)

## 2023-07-17 MED ORDER — CEFDINIR 300 MG PO CAPS
300.0000 mg | ORAL_CAPSULE | Freq: Two times a day (BID) | ORAL | 0 refills | Status: AC
  Filled 2023-07-17: qty 8, 4d supply, fill #0

## 2023-07-17 MED ORDER — DOXYCYCLINE HYCLATE 100 MG PO TABS
100.0000 mg | ORAL_TABLET | Freq: Two times a day (BID) | ORAL | 0 refills | Status: AC
Start: 2023-07-17 — End: 2023-07-21
  Filled 2023-07-17: qty 8, 4d supply, fill #0

## 2023-07-17 MED ORDER — HYDROCOD POLI-CHLORPHE POLI ER 10-8 MG/5ML PO SUER
5.0000 mL | Freq: Every evening | ORAL | 0 refills | Status: DC | PRN
  Filled 2023-07-17: qty 25, 5d supply, fill #0

## 2023-07-17 NOTE — Progress Notes (Signed)
 Remains on room air. Diuresed with Lasix  and sodium has improved to 133 CT chest shows multifocal pneumonia with small bilateral effusions Echo shows grade 2 diastolic dysfunction Would continue treatment for severe CAP and diuresis. Follow-up imaging as outpatient PCCM available as needed  Demari Gales V. Villa Greaser MD

## 2023-07-17 NOTE — Progress Notes (Signed)
 PROGRESS NOTE    Brandon Simmons.  WJX:[id number] DOB: Aug 26, 1945 DOA: 07/11/2023 PCP: Bertha Broad, MD   Brief Narrative: Brandon Simmons. is a 78 y.o. male with a history of proximal atrial fibrillation, hypertension, asthma, prostate cancer status post prostatectomy and radiation therapy, DVT, GERD, hyperlipidemia.  Patient presented secondary to fever, cough, and altered mental status/confusion with evidence of community-acquired pneumonia.  Empiric antibiotics started.  Blood cultures ordered and are pending.   Assessment and Plan:  Community-acquired pneumonia Patient with fever and leukocytosis. Chest x-ray with possible bibasilar early infiltrates. Blood cultures obtained. Empiric Ceftriaxone  and azithromycin  started. Sputum culture significant for GNR. Febrile overnight. Repeat chest x-ray obtained with evidence of progressive multifocal infiltrates. -Continue Azithromycin  and Ceftriaxone  -Follow-up blood cultures -Pulmonology recommendations: continue current plan; CT chest pending  Hyponatremia Sodium of 125 on admission. Unclear etiology but presumed secondary to hypovolemia. Serum osmolality of 270. Urine sodium undetectably low with urine osmolality of 421. Managed initially with IV fluids with stagnant sodium. Discussed with nephrology who recommended to stop IV fluids and start salt tablets for possible volume depletion SAIDH combination; development of some LE edema concerning for possible fluid retention. Lasix  IV started and Nephrology consulted. Lasix  continued in addition to sodium tablets. Sodium improved to 133 today. -Nephrology recommendations: sodium tablets, Lasix  IV x1. Pending today -BMP daily  Hypokalemia -Potassium supplementation to keep potassium >4  Acute metabolic encephalopathy Presumed secondary to pneumonia. Hyponatremia likely not bad enough to contribute. CT head negative for acute process. Resolved.  Normocytic  anemia Mild. Resolved.  Chronic thrombocytopenia Unclear etiology. Platelets of 131 on admission, down to 117 today. Likely reactive related to acute illness.  Paroxysmal atrial fibrillation Patient is on Eliquis  and dofetilide  as an outpatient. From last cardiology note, diltiazem  180 mg daily discontinued secondary to bradycardia. -Continue Eliquis  -Continue dofetilide  -Keep potassium greater than 4 and magnesium  greater than 2  Asthma Stable.  Primary hypertension Patient is on losartan  and diltiazem  as an outpatient.  GERD Patient is on omeprazole as an outpatient. -Protonix  while inpatient  Hyperlipidemia -Continue Lipitor  Chronic diastolic heart failure Stable.  Scrotal bleeding Likely secondary to abrasion from urinal device. Complicated by Eliquis  use. -CBC in AM   DVT prophylaxis: Eliquis  Code Status:   Code Status: Full Code Family Communication: Wife at bedside Disposition Plan: Discharge home likely in 1-2 days pending improved sodium and if remaining afebrile in addition to pulmonology and nephrology recommendations/management   Consultants:  Pulmonology Nephrology  Procedures:  None  Antimicrobials: Ceftriaxone  Doxycycline     Subjective: Bleeding from scrotum last night. No issues this morning. Feeling better today.  Objective: BP 134/70 (BP Location: Left Arm)   Pulse 70   Temp 99.1 F (37.3 C) (Oral)   Resp 19   Ht 5\' 10"  (1.778 m)   Wt 96.6 kg   SpO2 95%   BMI 30.56 kg/m   Examination:  General exam: Appears calm and comfortable Respiratory system: Bilateral rales. Respiratory effort normal. Cardiovascular system: S1 & S2 heard, RRR. No murmurs. Gastrointestinal system: Abdomen is nondistended, soft and nontender. Normal bowel sounds heard. Central nervous system: Alert and oriented. No focal neurological deficits. Musculoskeletal: BLE pitting edema mostly in feet. No calf tenderness Psychiatry: Judgement and insight appear  normal. Mood & affect appropriate.    Data Reviewed: I have personally reviewed following labs and imaging studies  CBC Lab Results  Component Value Date   WBC 11.0 (H) 07/16/2023   RBC 3.45 (L)  07/16/2023   HGB 10.9 (L) 07/16/2023   HCT 31.6 (L) 07/16/2023   MCV 91.6 07/16/2023   MCH 31.6 07/16/2023   PLT 167 07/16/2023   MCHC 34.5 07/16/2023   RDW 11.9 07/16/2023   LYMPHSABS 1.3 07/11/2023   MONOABS 1.3 (H) 07/11/2023   EOSABS 0.0 07/11/2023   BASOSABS 0.1 07/11/2023     Last metabolic panel Lab Results  Component Value Date   NA 133 (L) 07/17/2023   K 3.7 07/17/2023   CL 99 07/17/2023   CO2 26 07/17/2023   BUN 14 07/17/2023   CREATININE 0.97 07/17/2023   GLUCOSE 133 (H) 07/17/2023   GFRNONAA >60 07/17/2023   GFRAA 74 11/03/2019   CALCIUM  8.4 (L) 07/17/2023   PHOS 2.8 11/29/2021   PROT 5.5 (L) 07/13/2023   ALBUMIN 2.8 (L) 07/13/2023   BILITOT 0.9 07/13/2023   ALKPHOS 50 07/13/2023   AST 60 (H) 07/13/2023   ALT 29 07/13/2023   ANIONGAP 8 07/17/2023    GFR: Estimated Creatinine Clearance: 73.2 mL/min (by C-G formula based on SCr of 0.97 mg/dL).  Recent Results (from the past 240 hours)  Culture, blood (Routine x 2)     Status: None   Collection Time: 07/11/23  9:51 PM   Specimen: BLOOD  Result Value Ref Range Status   Specimen Description   Final    BLOOD RIGHT ANTECUBITAL Performed at [facility], [address] [place], [place] [address]    Special Requests   Final    BOTTLES DRAWN AEROBIC AND ANAEROBIC Blood Culture adequate volume Performed at [facility], [address] [place], [place] [address]    Culture   Final    NO GROWTH 5 DAYS Performed at [facility], [address], [place], [place] [address]    Report Status 07/17/2023 FINAL  Final  Culture, blood (Routine x 2)     Status: None   Collection Time: 07/11/23  9:51 PM   Specimen: BLOOD RIGHT FOREARM  Result Value Ref Range Status    Specimen Description   Final    BLOOD RIGHT FOREARM Performed at [facility], [address] [place], [place] [address]    Special Requests   Final    BOTTLES DRAWN AEROBIC AND ANAEROBIC Blood Culture adequate volume Performed at [facility], [address], [place], [place] [address]    Culture   Final    NO GROWTH 5 DAYS Performed at [facility], [address], [place], [place] [address]    Report Status 07/17/2023 FINAL  Final  MRSA Next Gen by PCR, Nasal     Status: None   Collection Time: 07/12/23  6:03 AM   Specimen: Anterior Nasal Swab  Result Value Ref Range Status   MRSA by PCR Next Gen NOT DETECTED NOT DETECTED Final    Comment: (NOTE) The GeneXpert MRSA Assay (FDA approved for NASAL specimens only), is one component of a comprehensive MRSA colonization surveillance program. It is not intended to diagnose MRSA infection nor to guide or monitor treatment for MRSA infections. Test performance is not FDA approved in patients less than 54 years old. Performed at [facility], [address], [place], [place] [address]   Resp panel by RT-PCR (RSV, Flu A&B, Covid) Anterior Nasal Swab     Status: None   Collection Time: 07/12/23  6:04 AM   Specimen: Anterior Nasal Swab  Result Value Ref Range Status   SARS Coronavirus 2  by RT PCR NEGATIVE NEGATIVE Final    Comment: (NOTE) SARS-CoV-2 target nucleic acids are NOT DETECTED.  The SARS-CoV-2 RNA is generally detectable in upper respiratory specimens during the acute phase of infection. The lowest concentration of SARS-CoV-2 viral copies this assay can detect is 138 copies/mL. A negative result does not preclude SARS-Cov-2 infection and should not be used as the sole basis for treatment or other patient management decisions. A negative result may occur with  improper specimen collection/handling, submission of specimen other than nasopharyngeal swab, presence  of viral mutation(s) within the areas targeted by this assay, and inadequate number of viral copies(<138 copies/mL). A negative result must be combined with clinical observations, patient history, and epidemiological information. The expected result is Negative.  Fact Sheet for Patients:  BloggerCourse.com  Fact Sheet for Healthcare Providers:  SeriousBroker.it  This test is no t yet approved or cleared by the [place]  FDA and  has been authorized for detection and/or diagnosis of SARS-CoV-2 by FDA under an Emergency Use Authorization (EUA). This EUA will remain  in effect (meaning this test can be used) for the duration of the COVID-19 declaration under Section 564(b)(1) of the Act, 21 U.S.C.section 360bbb-3(b)(1), unless the authorization is terminated  or revoked sooner.       Influenza A by PCR NEGATIVE NEGATIVE Final   Influenza B by PCR NEGATIVE NEGATIVE Final    Comment: (NOTE) The Xpert Xpress SARS-CoV-2/FLU/RSV plus assay is intended as an aid in the diagnosis of influenza from Nasopharyngeal swab specimens and should not be used as a sole basis for treatment. Nasal washings and aspirates are unacceptable for Xpert Xpress SARS-CoV-2/FLU/RSV testing.  Fact Sheet for Patients: BloggerCourse.com  Fact Sheet for Healthcare Providers: SeriousBroker.it  This test is not yet approved or cleared by the [place]  FDA and has been authorized for detection and/or diagnosis of SARS-CoV-2 by FDA under an Emergency Use Authorization (EUA). This EUA will remain in effect (meaning this test can be used) for the duration of the COVID-19 declaration under Section 564(b)(1) of the Act, 21 U.S.C. section 360bbb-3(b)(1), unless the authorization is terminated or revoked.     Resp Syncytial Virus by PCR NEGATIVE NEGATIVE Final    Comment: (NOTE) Fact Sheet for  Patients: BloggerCourse.com  Fact Sheet for Healthcare Providers: SeriousBroker.it  This test is not yet approved or cleared by the [place]  FDA and has been authorized for detection and/or diagnosis of SARS-CoV-2 by FDA under an Emergency Use Authorization (EUA). This EUA will remain in effect (meaning this test can be used) for the duration of the COVID-19 declaration under Section 564(b)(1) of the Act, 21 U.S.C. section 360bbb-3(b)(1), unless the authorization is terminated or revoked.  Performed at [facility], [address] [place], [place] [address]   Expectorated Sputum Assessment w Gram Stain, Rflx to Resp Cult     Status: None   Collection Time: 07/12/23 11:51 AM   Specimen: Sputum  Result Value Ref Range Status   Specimen Description SPUTUM  Final   Special Requests NONE  Final   Sputum evaluation   Final    THIS SPECIMEN IS ACCEPTABLE FOR SPUTUM CULTURE Performed at [facility], [address], [place], [place] [address]    Report Status 07/12/2023 FINAL  Final  Culture, Respiratory w Gram Stain     Status: None   Collection Time: 07/12/23 11:51 AM   Specimen: SPU  Result Value Ref Range Status   Specimen Description  Final    SPUTUM Performed at [facility], [address] [place], [place] [address]    Special Requests   Final    NONE Reflexed from [id number] Performed at [facility], [address], [place], [place] [address]    Gram Stain   Final    ABUNDANT WBC PRESENT, PREDOMINANTLY PMN RARE GRAM NEGATIVE RODS    Culture   Final    FEW Normal respiratory flora-no Staph aureus or Pseudomonas seen Performed at [facility], [address], [place], [place] [address]    Report Status 07/14/2023 FINAL  Final      Radiology Studies: ECHOCARDIOGRAM COMPLETE Result Date: 07/17/2023    ECHOCARDIOGRAM REPORT   Patient  Name:   Brandon Simmons. Date of Exam: 07/17/2023 Medical Rec #:  [id number]                Height:       70.0 in Accession #:    [id number]               Weight:       213.0 lb Date of Birth:  September 22, 1945                 BSA:          2.144 m Patient Age:    77 years                 BP:           134/70 mmHg Patient Gender: M                        HR:           60 bpm. Exam Location:  Inpatient Procedure: 2D Echo, Cardiac Doppler and Color Doppler (Both Spectral and Color            Flow Doppler were utilized during procedure). Indications:    I50.9* Heart failure (unspecified)  History:        Patient has prior history of Echocardiogram examinations, most                 recent 08/24/2018. Arrythmias:Atrial Fibrillation.  Sonographer:    Andrena Bang Referring Phys: [phone] Alveda Aures LIN IMPRESSIONS  1. Left ventricular ejection fraction, by estimation, is 55 to 60%. The left ventricle has normal function. The left ventricle has no regional wall motion abnormalities. Left ventricular diastolic parameters are consistent with Grade II diastolic dysfunction (pseudonormalization).  2. Right ventricular systolic function is normal. The right ventricular size is normal. There is mildly elevated pulmonary artery systolic pressure. The estimated right ventricular systolic pressure is 40.9 mmHg.  3. The mitral valve is normal in structure. Trivial mitral valve regurgitation. No evidence of mitral stenosis.  4. Tricuspid valve regurgitation is mild to moderate.  5. The aortic valve is tricuspid. Aortic valve regurgitation is mild.  6. Aortic dilatation noted. There is moderate dilatation of the aortic root, measuring 43 mm. There is mild dilatation of the ascending aorta, measuring 41 mm.  7. The inferior vena cava is normal in size with greater than 50% respiratory variability, suggesting right atrial pressure of 3 mmHg. Comparison(s): No significant change from prior study. Prior images reviewed side by side. FINDINGS  Left  Ventricle: Left ventricular ejection fraction, by estimation, is 55 to 60%. The left ventricle has normal function. The left ventricle has no regional wall motion abnormalities. The  left ventricular internal cavity size was normal in size. There is  borderline concentric left ventricular hypertrophy. Left ventricular diastolic parameters are consistent with Grade II diastolic dysfunction (pseudonormalization). Right Ventricle: The right ventricular size is normal. No increase in right ventricular wall thickness. Right ventricular systolic function is normal. There is mildly elevated pulmonary artery systolic pressure. The tricuspid regurgitant velocity is 3.08  m/s, and with an assumed right atrial pressure of 3 mmHg, the estimated right ventricular systolic pressure is 40.9 mmHg. Left Atrium: Left atrial size was normal in size. Right Atrium: Right atrial size was normal in size. Pericardium: There is no evidence of pericardial effusion. Mitral Valve: The mitral valve is normal in structure. Trivial mitral valve regurgitation. No evidence of mitral valve stenosis. Tricuspid Valve: The tricuspid valve is normal in structure. Tricuspid valve regurgitation is mild to moderate. Aortic Valve: The aortic valve is tricuspid. Aortic valve regurgitation is mild. Aortic regurgitation PHT measures 649 msec. Aortic valve mean gradient measures 6.0 mmHg. Aortic valve peak gradient measures 10.6 mmHg. Aortic valve area, by VTI measures 2.37 cm. Pulmonic Valve: The pulmonic valve was normal in structure. Pulmonic valve regurgitation is trivial. Aorta: Aortic dilatation noted. There is moderate dilatation of the aortic root, measuring 43 mm. There is mild dilatation of the ascending aorta, measuring 41 mm. Venous: The inferior vena cava is normal in size with greater than 50% respiratory variability, suggesting right atrial pressure of 3 mmHg. IAS/Shunts: No atrial level shunt detected by color flow Doppler.  LEFT VENTRICLE PLAX  2D LVIDd:         5.60 cm      Diastology LVIDs:         3.80 cm      LV e' medial:    7.83 cm/s LV PW:         1.20 cm      LV E/e' medial:  10.3 LV IVS:        1.00 cm      LV e' lateral:   11.00 cm/s LVOT diam:     2.20 cm      LV E/e' lateral: 7.4 LV SV:         78 LV SV Index:   37 LVOT Area:     3.80 cm  LV Volumes (MOD) LV vol d, MOD A2C: 136.0 ml LV vol d, MOD A4C: 120.0 ml LV vol s, MOD A2C: 44.6 ml LV vol s, MOD A4C: 52.7 ml LV SV MOD A2C:     91.4 ml LV SV MOD A4C:     120.0 ml LV SV MOD BP:      80.8 ml RIGHT VENTRICLE RV S prime:     16.90 cm/s TAPSE (M-mode): 3.4 cm LEFT ATRIUM             Index LA diam:        4.00 cm 1.87 cm/m LA Vol (A2C):   53.5 ml 24.96 ml/m LA Vol (A4C):   30.5 ml 14.23 ml/m LA Biplane Vol: 42.7 ml 19.92 ml/m  AORTIC VALVE AV Area (Vmax):    2.80 cm AV Area (Vmean):   2.46 cm AV Area (VTI):     2.37 cm AV Vmax:           163.00 cm/s AV Vmean:          112.000 cm/s AV VTI:            0.331 m AV Peak Grad:  10.6 mmHg AV Mean Grad:      6.0 mmHg LVOT Vmax:         120.00 cm/s LVOT Vmean:        72.600 cm/s LVOT VTI:          0.206 m LVOT/AV VTI ratio: 0.62 AI PHT:            649 msec  AORTA Ao Asc diam: 4.10 cm MITRAL VALVE               TRICUSPID VALVE MV Area (PHT): 2.80 cm    TR Peak grad:   37.9 mmHg MV Decel Time: 271 msec    TR Vmax:        308.00 cm/s MV E velocity: 81.00 cm/s MV A velocity: 40.70 cm/s  SHUNTS MV E/A ratio:  1.99        Systemic VTI:  0.21 m                            Systemic Diam: 2.20 cm Luana Rumple MD Electronically signed by Luana Rumple MD Signature Date/Time: 07/17/2023/12:39:23 PM    Final        LOS: 5 days    Aneita Keens, MD [facility] 07/17/2023, 12:44 PM   If 7PM-7AM, please contact night-coverage [email or web address]

## 2023-07-17 NOTE — Plan of Care (Signed)

## 2023-07-17 NOTE — Progress Notes (Signed)
 Discharge instructions given to patient and wife questions asked and answered. Sylvia Everts  RN

## 2023-07-17 NOTE — Progress Notes (Signed)
  Echocardiogram 2D Echocardiogram has been performed.  Annis Kinder, RDCS 07/17/2023, 11:21 AM

## 2023-07-17 NOTE — Progress Notes (Signed)
 [facility] Progress Note  Subjective:  Seen in room Na+ up to 133 today  Vitals:   07/16/23 2012 07/17/23 0500 07/17/23 0557 07/17/23 1250  BP: (!) 146/81  134/70 132/68  Pulse: 85  70 (!) 58  Resp: 20  19 20   Temp: 98.1 F (36.7 C)  99.1 F (37.3 C) 98.9 F (37.2 C)  TempSrc: Oral  Oral   SpO2: 98%  95% 96%  Weight:  96.6 kg    Height:        Exam: General appearance: alert, cooperative Neck: no adenopathy, no carotid bruit, supple Back: symmetric, no curvature. 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  Urinary sodium was initially a, 10 and UOsm in the 400s.  BUN only 8-11 which may represent very poor oral intake. Patient is net -2.9 L during this hospitalization. Was rx'd 1st w/ NS 0.9% and Na went from 125 -> 124. Then was given salt tabs 1 tid w/ improvement up to 128. Then IV lasix  20mg  4/27 and 40mg  IV on 4/28. Pt had large diuresis and Na+ today is up to 133. Cortisol and TSH are wnl. LE ankle/ pedal edema better, mostly resolved. ECHO results pending. Suspect hyponatremia due to CHF/ vol overload, improved now with IV lasix . He may need a low dose of po lasix  at home, 10-20 mg daily or daily prn. Have d/w the salt tabs. Will need f/u w/ PCP in 7-10 to retest ideally. No further suggestions, will sign off.  Metabolic encephalopathy: probably from significant infection. Paroxysmal atrial fibrillation: on Eliquis  and dofetilide  Hypertension CAP on  antibiotics.     Larry Poag MD  [facility] 07/17/2023, 12:52 PM  Recent Labs  Lab 07/12/23 1610 07/13/23 0338 07/14/23 0356 07/15/23 0352 07/16/23 0324 07/17/23 0352  HGB 11.8* 13.1  --  11.6* 10.9*  --   ALBUMIN 3.3* 2.8*  --   --   --   --   CALCIUM  8.0* 7.6*   < > 8.2* 7.8* 8.4*  CREATININE 0.70 0.95   < > 0.86 0.87 0.97  K 3.4* 3.8   < > 3.5 4.1 3.7   < > = values in this interval not displayed.   No results for input(s): "IRON", "TIBC", "FERRITIN" in the last 168 hours. Inpatient medications:  apixaban   5 mg Oral BID   atorvastatin   20 mg Oral QHS   dofetilide   500 mcg Oral BID   doxycycline   100 mg Oral Q12H   loratadine   10 mg Oral Daily   pantoprazole   40 mg Oral Daily   psyllium  1 packet Oral Daily   sodium chloride   1 g Oral TID WC    cefTRIAXone  (ROCEPHIN )  IV 1 g (07/16/23 2203)   acetaminophen  **OR** acetaminophen , albuterol , chlorpheniramine-HYDROcodone , guaiFENesin -dextromethorphan , LORazepam, mouth rinse, phenol

## 2023-07-17 NOTE — Progress Notes (Signed)
 Mobility Specialist - Progress Note   07/17/23 1204  Mobility  Activity Ambulated with assistance in hallway  Level of Assistance Modified independent, requires aide device or extra time  Assistive Device Four wheel walker  Distance Ambulated (ft) 700 ft  Activity Response Tolerated well  Mobility Referral Yes  Mobility visit 1 Mobility  Mobility Specialist Start Time (ACUTE ONLY) 1149  Mobility Specialist Stop Time (ACUTE ONLY) 1203  Mobility Specialist Time Calculation (min) (ACUTE ONLY) 14 min   Pt received in room standing and agreeable to mobility. No complaints during session. Pt to rollator seat after session with all needs met.     [facility]

## 2023-07-17 NOTE — Telephone Encounter (Signed)
 Please make hospital follow-up appointment/next available time with me or APP

## 2023-07-17 NOTE — Discharge Instructions (Addendum)
 Brandon Simmons.,  You are in the hospital with multifocal pneumonia.  This is complicated by hyponatremia (low sodium level).  You are managed with antibiotics with slow improvement in her symptoms.  Your sodium was manage initially with IV fluids but then you were transitioned to salt tablets in addition to Lasix  with eventual improvement in your sodium.  You are seen by the pulmonologist and nephrologist while you were admitted.  Recommendation to continue treating you for severe multifocal pneumonia with pulmonology follow-up as an outpatient.  Recommendation for repeat lab work with your primary care physician on discharge to follow-up on your hyponatremia. Lasix  was held secondary to your Tikosyn  use, but you can use compression stockings, ambulation and leg elevation to improve your lower extremity swelling.  It was a pleasure to meet a fellow Primus Brookes, in addition to your wife.  Aneita Keens, MD [facility]

## 2023-07-17 NOTE — Progress Notes (Addendum)
 The patient called me to his room after he noticed bleeding from his scrotum while voiding. When I entered the room, there was bright red blood on the bathroom floor commode, and the patient's underwear. The patient's reported no discomfort or associated symptoms. During my assessment, I discovered two tiny pin point punctures on the patient's scrotum, but no active bleed noted. Will continue with plan of care. Lauren CN and Denece Finger, NP notified.

## 2023-07-17 NOTE — Discharge Summary (Signed)
 Physician Discharge Summary   Patient: Brandon Simmons. MRN: [id number] DOB: 1945-09-07  Admit date:     07/11/2023  Discharge date: 07/17/23  Discharge Physician: Aneita Keens   PCP: Bertha Broad, MD   Recommendations at discharge:  PCP visit for hospital follow-up Pulmonology visit for hospital follow-up BMP in 3-5 days to follow-up hyponatremia  Discharge Diagnoses: Principal Problem:   Multifocal pneumonia Active Problems:   PAF (paroxysmal atrial fibrillation) (HCC)   Hyponatremia   Acute metabolic encephalopathy   Normocytic anemia   Thrombocytopenia (HCC)   Asthma, chronic  Resolved Problems:   * No resolved hospital problems. *  Hospital Course: Brandon King Parrado. is a 78 y.o. male with a history of proximal atrial fibrillation, hypertension, asthma, prostate cancer status post prostatectomy and radiation therapy, DVT, GERD, hyperlipidemia.  Patient presented secondary to fever, cough, and altered mental status/confusion with evidence of community-acquired pneumonia.  Empiric antibiotics started.  Blood cultures ordered and are no growth. Hospitalization complicated by persistent hyponatremia and worsening pneumonia symptoms. Pulmonology consulted. CT imaging confirmed multifocal pneumonia. Nephrology consulted for assistance with hyponatremia which improved initially with IV fluids, followed by Lasix .  Assessment and Plan:  Community-acquired pneumonia Multifocal pneumonia Patient with fever and leukocytosis. Chest x-ray with possible bibasilar early infiltrates. Blood cultures obtained. Empiric Ceftriaxone  and doxycycline  started. Sputum culture significant for GNR. Febrile overnight. Repeat chest x-ray obtained with evidence of progressive multifocal infiltrates; confirmed on CT imaging. Patient discharged on Cefdinir  and doxycyline on discharge.   Hyponatremia Sodium of 125 on admission. Unclear etiology but presumed secondary to hypovolemia. Serum  osmolality of 270. Urine sodium undetectably low with urine osmolality of 421. Managed initially with IV fluids with stagnant sodium. Discussed with nephrology who recommended to stop IV fluids and start salt tablets for possible volume depletion SAIDH combination; development of some LE edema concerning for possible fluid retention. Lasix  IV started and Nephrology consulted. Lasix  continued in addition to sodium tablets. Sodium improved to 133.   Hypokalemia Potassium supplementation to keep potassium >4. Continue potassium on discharge.   Acute metabolic encephalopathy Presumed secondary to pneumonia. Hyponatremia likely not bad enough to contribute. CT head negative for acute process. Resolved.   Normocytic anemia Mild. Resolved.   Chronic thrombocytopenia Unclear etiology. Platelets of 131 on admission, down to 117 but now recovered. Likely reactive related to acute illness.   Paroxysmal atrial fibrillation Patient is on Eliquis  and dofetilide  as an outpatient. From last cardiology note, diltiazem  180 mg daily discontinued secondary to bradycardia. Continue home regimen on discharge.   Asthma Stable.   Primary hypertension Patient is on losartan  and diltiazem  as an outpatient.   GERD Patient is on omeprazole as an outpatient. Continue on discharge.   Hyperlipidemia Continue Lipitor   Chronic diastolic heart failure Stable. Repeat Transthoracic Echocardiogram obtained and confirmed stable Transthoracic Echocardiogram.   Scrotal bleeding Likely secondary to abrasion from urinal device. Complicated by Eliquis  use. Resolved.   Consultants: Pulmonology, Nephrology Procedures performed: Transthoracic Echocardiogram  Disposition: Home Diet recommendation: Cardiac diet   DISCHARGE MEDICATION: Allergies as of 07/17/2023   No Known Allergies      Medication List     STOP taking these medications    diltiazem  180 MG 24 hr capsule Commonly known as: CARDIZEM  CD        TAKE these medications    acetaminophen  500 MG tablet Commonly known as: TYLENOL  Take 500 mg by mouth at bedtime.   Align 4 MG Caps Take 4 mg  by mouth every evening.   Allegra Allergy 180 MG tablet Generic drug: fexofenadine Take 1 tablet by mouth daily.   apixaban  5 MG Tabs tablet Commonly known as: ELIQUIS  Take 5 mg by mouth 2 (two) times daily.   Asmanex  HFA 100 MCG/ACT Aero Generic drug: Mometasone  Furoate Inhale 1 puff into the lungs daily as needed (wheezing).   atorvastatin  20 MG tablet Commonly known as: LIPITOR Take 20 mg by mouth at bedtime.   Azelastine  HCl 137 MCG/SPRAY Soln Place 1 spray into both nostrils 2 (two) times daily.   cefdinir  300 MG capsule Commonly known as: OMNICEF  Take 1 capsule (300 mg total) by mouth 2 (two) times daily for 4 days.   chlorpheniramine-HYDROcodone  10-8 MG/5ML Commonly known as: TUSSIONEX Take 5 mLs by mouth at bedtime as needed for up to 5 days for cough.   diltiazem  30 MG tablet Commonly known as: CARDIZEM  Take 1 tablet every 4 hours AS NEEDED for afib rapid heart rate over 100 What changed:  how much to take how to take this when to take this reasons to take this additional instructions   dofetilide  500 MCG capsule Commonly known as: TIKOSYN  Take 1 capsule (500 mcg total) by mouth 2 (two) times daily.   doxycycline  100 MG tablet Commonly known as: VIBRA -TABS Take 1 tablet (100 mg total) by mouth every 12 (twelve) hours for 4 days.   EPINEPHrine  0.3 mg/0.3 mL Soaj injection Commonly known as: EPI-PEN Inject 0.3 mg into the muscle as needed for anaphylaxis.   fluticasone  50 MCG/ACT nasal spray Commonly known as: FLONASE  Place 1 spray into both nostrils every evening.   GLUCOSAMINE-CHONDROITIN PO Take 1,500 mg by mouth daily.   ipratropium 0.06 % nasal spray Commonly known as: ATROVENT  Place 2 sprays into both nostrils at bedtime.   losartan  50 MG tablet Commonly known as: COZAAR  Take 25 mg by mouth  daily.   Lysine  500 MG Tabs Take 1 tablet by mouth daily at 12 noon.   montelukast  10 MG tablet Commonly known as: SINGULAIR  Take 10 mg by mouth at bedtime.   omeprazole 20 MG capsule Commonly known as: PRILOSEC Take 20 mg by mouth 2 (two) times daily before a meal.   Opcon-A  0.027-0.315 % Soln Generic drug: Naphazoline-Pheniramine Place 1 drop into both eyes as needed (eye allergies (red/irritated eyes)).   potassium chloride  10 MEQ tablet Commonly known as: Klor-Con  M10 Take 3 tablets by mouth daily What changed:  how much to take how to take this when to take this additional instructions   psyllium 58.6 % powder Commonly known as: METAMUCIL Take 1 packet by mouth in the morning and at bedtime.   Ventolin  HFA 108 (90 Base) MCG/ACT inhaler Generic drug: albuterol  Inhale 1-2 puffs into the lungs every 6 (six) hours as needed for wheezing or shortness of breath.   VITAMIN C  PO Take 500 mg by mouth daily at 12 noon.   VITAMIN D  PO Take 1,000 Units by mouth daily.   zolpidem  10 MG tablet Commonly known as: AMBIEN  Take 10 mg by mouth at bedtime as needed.               Durable Medical Equipment  (From admission, onward)           Start     Ordered   07/12/23 1343  For home use only DME 4 wheeled rolling walker with seat  Once       Question:  Patient needs a walker to treat with  the following condition  Answer:  Difficulty in walking, not elsewhere classified   07/12/23 1342            Follow-up Information     Bertha Broad, MD. Schedule an appointment as soon as possible for a visit in 1 week(s).   Specialty: Internal Medicine Why: For hospital follow-up. Repeat metabolic panel. Contact information: [address] [place] [address] [phone]         Mannam, Praveen, MD. Schedule an appointment as soon as possible for a visit.   Specialty: Pulmonary Disease Why: For hospital follow-up for pneumonia Contact  information: [address] [place] [address] [place] [address] [phone]         The [facility]. Go to.   Specialty: Emergency Medicine Why: As needed, If symptoms worsen Contact information: [address] [facility] [place]  [address] [phone]               Discharge Exam: BP 132/68 (BP Location: Left Arm)   Pulse (!) 58   Temp 98.9 F (37.2 C)   Resp 20   Ht 5\' 10"  (1.778 m)   Wt 96.6 kg   SpO2 96%   BMI 30.56 kg/m   General exam: Appears calm and comfortable Respiratory system: Bilateral rales. Respiratory effort normal. Cardiovascular system: S1 & S2 heard, RRR. No murmurs. Gastrointestinal system: Abdomen is nondistended, soft and nontender. Normal bowel sounds heard. Central nervous system: Alert and oriented. No focal neurological deficits. Musculoskeletal: BLE pitting edema mostly in feet. No calf tenderness Psychiatry: Judgement and insight appear normal. Mood & affect appropriate.   Condition at discharge: stable  The results of significant diagnostics from this hospitalization (including imaging, microbiology, ancillary and laboratory) are listed below for reference.   Imaging Studies: CT CHEST WO CONTRAST Result Date: 07/17/2023 CLINICAL DATA:  Pneumonia.  Complication suspected. EXAM: CT CHEST WITHOUT CONTRAST TECHNIQUE: Multidetector CT imaging of the chest was performed following the standard protocol without IV contrast. RADIATION DOSE REDUCTION: This exam was performed according to the departmental dose-optimization program which includes automated exposure control, adjustment of the mA and/or kV according to patient size and/or use of iterative reconstruction technique. COMPARISON:  Chest radiograph dated 07/15/2023. FINDINGS: Evaluation of this exam is limited in the absence of intravenous contrast. Cardiovascular: There is no cardiomegaly. Trace pericardial effusion. Coronary vascular  calcification of the LAD and RCA. Mild atherosclerotic calcification of the thoracic aorta. No aneurysmal dilatation. The central pulmonary arteries are grossly unremarkable. Mediastinum/Nodes: No hilar or mediastinal adenopathy. The esophagus is grossly unremarkable. No mediastinal fluid collection. Lungs/Pleura: Small bilateral pleural effusions. Bilateral patchy ground-glass opacities consistent with multifocal pneumonia. Clinical correlation and follow-up to resolution recommended. No pneumothorax. The central airways are patent. Upper Abdomen: No acute abnormality. Musculoskeletal: No acute osseous pathology. Left shoulder arthroplasty. IMPRESSION: 1. Multifocal pneumonia. Clinical correlation and follow-up to resolution recommended. 2. Small bilateral pleural effusions. 3.  Aortic Atherosclerosis (ICD10-I70.0). Electronically Signed   By: Angus Bark M.D.   On: 07/17/2023 14:07   ECHOCARDIOGRAM COMPLETE Result Date: 07/17/2023    ECHOCARDIOGRAM REPORT   Patient Name:   Brandon Simmons. Date of Exam: 07/17/2023 Medical Rec #:  [id number]                Height:       70.0 in Accession #:    [id number]               Weight:  213.0 lb Date of Birth:  Jun 29, 1945                 BSA:          2.144 m Patient Age:    34 years                 BP:           134/70 mmHg Patient Gender: M                        HR:           60 bpm. Exam Location:  Inpatient Procedure: 2D Echo, Cardiac Doppler and Color Doppler (Both Spectral and Color            Flow Doppler were utilized during procedure). Indications:    I50.9* Heart failure (unspecified)  History:        Patient has prior history of Echocardiogram examinations, most                 recent 08/24/2018. Arrythmias:Atrial Fibrillation.  Sonographer:    Andrena Bang Referring Phys: [phone] Alveda Aures LIN IMPRESSIONS  1. Left ventricular ejection fraction, by estimation, is 55 to 60%. The left ventricle has normal function. The left ventricle has no regional wall  motion abnormalities. Left ventricular diastolic parameters are consistent with Grade II diastolic dysfunction (pseudonormalization).  2. Right ventricular systolic function is normal. The right ventricular size is normal. There is mildly elevated pulmonary artery systolic pressure. The estimated right ventricular systolic pressure is 40.9 mmHg.  3. The mitral valve is normal in structure. Trivial mitral valve regurgitation. No evidence of mitral stenosis.  4. Tricuspid valve regurgitation is mild to moderate.  5. The aortic valve is tricuspid. Aortic valve regurgitation is mild.  6. Aortic dilatation noted. There is moderate dilatation of the aortic root, measuring 43 mm. There is mild dilatation of the ascending aorta, measuring 41 mm.  7. The inferior vena cava is normal in size with greater than 50% respiratory variability, suggesting right atrial pressure of 3 mmHg. Comparison(s): No significant change from prior study. Prior images reviewed side by side. FINDINGS  Left Ventricle: Left ventricular ejection fraction, by estimation, is 55 to 60%. The left ventricle has normal function. The left ventricle has no regional wall motion abnormalities. The left ventricular internal cavity size was normal in size. There is  borderline concentric left ventricular hypertrophy. Left ventricular diastolic parameters are consistent with Grade II diastolic dysfunction (pseudonormalization). Right Ventricle: The right ventricular size is normal. No increase in right ventricular wall thickness. Right ventricular systolic function is normal. There is mildly elevated pulmonary artery systolic pressure. The tricuspid regurgitant velocity is 3.08  m/s, and with an assumed right atrial pressure of 3 mmHg, the estimated right ventricular systolic pressure is 40.9 mmHg. Left Atrium: Left atrial size was normal in size. Right Atrium: Right atrial size was normal in size. Pericardium: There is no evidence of pericardial effusion. Mitral  Valve: The mitral valve is normal in structure. Trivial mitral valve regurgitation. No evidence of mitral valve stenosis. Tricuspid Valve: The tricuspid valve is normal in structure. Tricuspid valve regurgitation is mild to moderate. Aortic Valve: The aortic valve is tricuspid. Aortic valve regurgitation is mild. Aortic regurgitation PHT measures 649 msec. Aortic valve mean gradient measures 6.0 mmHg. Aortic valve peak gradient measures 10.6 mmHg. Aortic valve area, by VTI measures 2.37 cm. Pulmonic Valve: The pulmonic valve was normal in  structure. Pulmonic valve regurgitation is trivial. Aorta: Aortic dilatation noted. There is moderate dilatation of the aortic root, measuring 43 mm. There is mild dilatation of the ascending aorta, measuring 41 mm. Venous: The inferior vena cava is normal in size with greater than 50% respiratory variability, suggesting right atrial pressure of 3 mmHg. IAS/Shunts: No atrial level shunt detected by color flow Doppler.  LEFT VENTRICLE PLAX 2D LVIDd:         5.60 cm      Diastology LVIDs:         3.80 cm      LV e' medial:    7.83 cm/s LV PW:         1.20 cm      LV E/e' medial:  10.3 LV IVS:        1.00 cm      LV e' lateral:   11.00 cm/s LVOT diam:     2.20 cm      LV E/e' lateral: 7.4 LV SV:         78 LV SV Index:   37 LVOT Area:     3.80 cm  LV Volumes (MOD) LV vol d, MOD A2C: 136.0 ml LV vol d, MOD A4C: 120.0 ml LV vol s, MOD A2C: 44.6 ml LV vol s, MOD A4C: 52.7 ml LV SV MOD A2C:     91.4 ml LV SV MOD A4C:     120.0 ml LV SV MOD BP:      80.8 ml RIGHT VENTRICLE RV S prime:     16.90 cm/s TAPSE (M-mode): 3.4 cm LEFT ATRIUM             Index LA diam:        4.00 cm 1.87 cm/m LA Vol (A2C):   53.5 ml 24.96 ml/m LA Vol (A4C):   30.5 ml 14.23 ml/m LA Biplane Vol: 42.7 ml 19.92 ml/m  AORTIC VALVE AV Area (Vmax):    2.80 cm AV Area (Vmean):   2.46 cm AV Area (VTI):     2.37 cm AV Vmax:           163.00 cm/s AV Vmean:          112.000 cm/s AV VTI:            0.331 m AV Peak  Grad:      10.6 mmHg AV Mean Grad:      6.0 mmHg LVOT Vmax:         120.00 cm/s LVOT Vmean:        72.600 cm/s LVOT VTI:          0.206 m LVOT/AV VTI ratio: 0.62 AI PHT:            649 msec  AORTA Ao Asc diam: 4.10 cm MITRAL VALVE               TRICUSPID VALVE MV Area (PHT): 2.80 cm    TR Peak grad:   37.9 mmHg MV Decel Time: 271 msec    TR Vmax:        308.00 cm/s MV E velocity: 81.00 cm/s MV A velocity: 40.70 cm/s  SHUNTS MV E/A ratio:  1.99        Systemic VTI:  0.21 m                            Systemic Diam: 2.20 cm Karyl Paget Croitoru MD Electronically signed by Luana Rumple  MD Signature Date/Time: 07/17/2023/12:39:23 PM    Final    DG CHEST PORT 1 VIEW Result Date: 07/15/2023 CLINICAL DATA:  Cough.  Shortness of breath.  Pneumonia. EXAM: PORTABLE CHEST 1 VIEW COMPARISON:  07/11/2023 FINDINGS: Left shoulder arthroplasty. Patient rotated right. Mild cardiomegaly. No pleural effusion or pneumothorax. New right upper lobe airspace disease. Increased bibasilar airspace disease. Relative sparing of the left upper lung. IMPRESSION: New and progressive multifocal pneumonia. Electronically Signed   By: Lore Rode M.D.   On: 07/15/2023 10:04   CT Head Wo Contrast Result Date: 07/11/2023 CLINICAL DATA:  Altered mental status EXAM: CT HEAD WITHOUT CONTRAST TECHNIQUE: Contiguous axial images were obtained from the base of the skull through the vertex without intravenous contrast. RADIATION DOSE REDUCTION: This exam was performed according to the departmental dose-optimization program which includes automated exposure control, adjustment of the mA and/or kV according to patient size and/or use of iterative reconstruction technique. COMPARISON:  None Available. FINDINGS: Brain: No mass,hemorrhage or extra-axial collection. Normal appearance of the parenchyma and CSF spaces. Vascular: No hyperdense vessel or unexpected vascular calcification. Skull: The visualized skull base, calvarium and extracranial soft tissues are  normal. Sinuses/Orbits: No fluid levels or advanced mucosal thickening of the visualized paranasal sinuses. No mastoid or middle ear effusion. Normal orbits. Other: None. IMPRESSION: Normal head CT. Electronically Signed   By: Juanetta Nordmann M.D.   On: 07/11/2023 23:35   DG Chest Port 1 View Result Date: 07/11/2023 CLINICAL DATA:  Sepsis. EXAM: PORTABLE CHEST 1 VIEW COMPARISON:  01/19/2023 FINDINGS: The cardiac silhouette, mediastinal and hilar contours are within normal limits. Streaky bibasilar density could be atelectasis or early infiltrates. No pleural effusions. No pulmonary lesions. The bony thorax is intact. Stable left shoulder arthroplasty. IMPRESSION: Streaky bibasilar density could be atelectasis or early infiltrates. Electronically Signed   By: Marrian Siva M.D.   On: 07/11/2023 22:01    Microbiology: Results for orders placed or performed during the hospital encounter of 07/11/23  Culture, blood (Routine x 2)     Status: None   Collection Time: 07/11/23  9:51 PM   Specimen: BLOOD  Result Value Ref Range Status   Specimen Description   Final    BLOOD RIGHT ANTECUBITAL Performed at [facility], [address] [place], [place] [address]    Special Requests   Final    BOTTLES DRAWN AEROBIC AND ANAEROBIC Blood Culture adequate volume Performed at [facility], [address] [place], [place] [address]    Culture   Final    NO GROWTH 5 DAYS Performed at [facility], [address], [place], [place] [address]    Report Status 07/17/2023 FINAL  Final  Culture, blood (Routine x 2)     Status: None   Collection Time: 07/11/23  9:51 PM   Specimen: BLOOD RIGHT FOREARM  Result Value Ref Range Status   Specimen Description   Final    BLOOD RIGHT FOREARM Performed at [facility], [address] [place], [place] [address]    Special Requests   Final    BOTTLES DRAWN AEROBIC AND ANAEROBIC Blood Culture adequate  volume Performed at [facility], [address] [place], [place] [address]    Culture   Final    NO GROWTH 5 DAYS Performed at [facility], [address] [place], [place] [address]    Report Status 07/17/2023 FINAL  Final  MRSA Next Gen by PCR, Nasal  Status: None   Collection Time: 07/12/23  6:03 AM   Specimen: Anterior Nasal Swab  Result Value Ref Range Status   MRSA by PCR Next Gen NOT DETECTED NOT DETECTED Final    Comment: (NOTE) The GeneXpert MRSA Assay (FDA approved for NASAL specimens only), is one component of a comprehensive MRSA colonization surveillance program. It is not intended to diagnose MRSA infection nor to guide or monitor treatment for MRSA infections. Test performance is not FDA approved in patients less than 40 years old. Performed at [facility], [address], [place], [place] [address]   Resp panel by RT-PCR (RSV, Flu A&B, Covid) Anterior Nasal Swab     Status: None   Collection Time: 07/12/23  6:04 AM   Specimen: Anterior Nasal Swab  Result Value Ref Range Status   SARS Coronavirus 2 by RT PCR NEGATIVE NEGATIVE Final    Comment: (NOTE) SARS-CoV-2 target nucleic acids are NOT DETECTED.  The SARS-CoV-2 RNA is generally detectable in upper respiratory specimens during the acute phase of infection. The lowest concentration of SARS-CoV-2 viral copies this assay can detect is 138 copies/mL. A negative result does not preclude SARS-Cov-2 infection and should not be used as the sole basis for treatment or other patient management decisions. A negative result may occur with  improper specimen collection/handling, submission of specimen other than nasopharyngeal swab, presence of viral mutation(s) within the areas targeted by this assay, and inadequate number of viral copies(<138 copies/mL). A negative result must be combined with clinical observations, patient history, and epidemiological information.  The expected result is Negative.  Fact Sheet for Patients:  BloggerCourse.com  Fact Sheet for Healthcare Providers:  SeriousBroker.it  This test is no t yet approved or cleared by the [place]  FDA and  has been authorized for detection and/or diagnosis of SARS-CoV-2 by FDA under an Emergency Use Authorization (EUA). This EUA will remain  in effect (meaning this test can be used) for the duration of the COVID-19 declaration under Section 564(b)(1) of the Act, 21 U.S.C.section 360bbb-3(b)(1), unless the authorization is terminated  or revoked sooner.       Influenza A by PCR NEGATIVE NEGATIVE Final   Influenza B by PCR NEGATIVE NEGATIVE Final    Comment: (NOTE) The Xpert Xpress SARS-CoV-2/FLU/RSV plus assay is intended as an aid in the diagnosis of influenza from Nasopharyngeal swab specimens and should not be used as a sole basis for treatment. Nasal washings and aspirates are unacceptable for Xpert Xpress SARS-CoV-2/FLU/RSV testing.  Fact Sheet for Patients: BloggerCourse.com  Fact Sheet for Healthcare Providers: SeriousBroker.it  This test is not yet approved or cleared by the [place]  FDA and has been authorized for detection and/or diagnosis of SARS-CoV-2 by FDA under an Emergency Use Authorization (EUA). This EUA will remain in effect (meaning this test can be used) for the duration of the COVID-19 declaration under Section 564(b)(1) of the Act, 21 U.S.C. section 360bbb-3(b)(1), unless the authorization is terminated or revoked.     Resp Syncytial Virus by PCR NEGATIVE NEGATIVE Final    Comment: (NOTE) Fact Sheet for Patients: BloggerCourse.com  Fact Sheet for Healthcare Providers: SeriousBroker.it  This test is not yet approved or cleared by the [place]  FDA and has been authorized for detection and/or  diagnosis of SARS-CoV-2 by FDA under an Emergency Use Authorization (EUA). This EUA will remain in effect (meaning this test can be used) for the duration of the COVID-19 declaration under Section 564(b)(1) of the Act, 21 U.S.C.  section 360bbb-3(b)(1), unless the authorization is terminated or revoked.  Performed at [facility], [address] [place], [place] [address]   Expectorated Sputum Assessment w Gram Stain, Rflx to Resp Cult     Status: None   Collection Time: 07/12/23 11:51 AM   Specimen: Sputum  Result Value Ref Range Status   Specimen Description SPUTUM  Final   Special Requests NONE  Final   Sputum evaluation   Final    THIS SPECIMEN IS ACCEPTABLE FOR SPUTUM CULTURE Performed at [facility], [address], [place], [place] [address]    Report Status 07/12/2023 FINAL  Final  Culture, Respiratory w Gram Stain     Status: None   Collection Time: 07/12/23 11:51 AM   Specimen: SPU  Result Value Ref Range Status   Specimen Description   Final    SPUTUM Performed at [facility], [address] [place], [place] [address]    Special Requests   Final    NONE Reflexed from [id number] Performed at [facility], [address] [place], [place] [address]    Gram Stain   Final    ABUNDANT WBC PRESENT, PREDOMINANTLY PMN RARE GRAM NEGATIVE RODS    Culture   Final    FEW Normal respiratory flora-no Staph aureus or Pseudomonas seen Performed at [facility], [address] [place], [place] [address]    Report Status 07/14/2023 FINAL  Final    Labs: CBC: Recent Labs  Lab 07/11/23 2150 07/12/23 0633 07/13/23 0338 07/15/23 0352 07/16/23 0324  WBC 11.0* 11.3* 10.4 13.8* 11.0*  NEUTROABS 8.3*  --   --   --   --   HGB 12.1* 11.8* 13.1 11.6* 10.9*  HCT 34.3* 34.2* 37.1* 34.1* 31.6*  MCV 89.6 91.7 91.6 92.9 91.6  PLT 131* 117* 116* 166 167   Basic Metabolic Panel: Recent Labs  Lab  07/12/23 0633 07/12/23 1226 07/13/23 0338 07/13/23 1233 07/14/23 0356 07/14/23 1316 07/15/23 0352 07/15/23 1338 07/15/23 2054 07/16/23 0324 07/16/23 1254 07/16/23 2058 07/17/23 0352  NA 125*   < > 124*   < > 127*   < > 128*   < > 127* 128* 128* 132* 133*  K 3.4*  --  3.8  --  4.0  --  3.5  --   --  4.1  --   --  3.7  CL 97*  --  95*  --  96*  --  94*  --   --  98  --   --  99  CO2 20*  --  20*  --  21*  --  24  --   --  18*  --   --  26  GLUCOSE 149*  --  114*  --  140*  --  139*  --   --  120*  --   --  133*  BUN 8  --  10  --  8  --  10  --   --  11  --   --  14  CREATININE 0.70  --  0.95  --  0.94  --  0.86  --   --  0.87  --   --  0.97  CALCIUM  8.0*  --  7.6*  --  8.0*  --  8.2*  --   --  7.8*  --   --  8.4*  MG 1.9  --  1.8  --   --   --   --   --   --  2.1  --   --   --    < > = values in this interval not displayed.   Liver Function Tests: Recent Labs  Lab 07/11/23 2150 07/12/23 0633 07/13/23 0338  AST 48* 56* 60*  ALT 24 28 29   ALKPHOS 66 55 50  BILITOT 0.9 1.3* 0.9  PROT 6.1* 6.0* 5.5*  ALBUMIN 3.8 3.3* 2.8*    Discharge time spent: 35 minutes.  Signed: Aneita Keens, MD [facility] 07/17/2023

## 2023-07-19 ENCOUNTER — Non-Acute Institutional Stay (SKILLED_NURSING_FACILITY): Payer: Self-pay | Admitting: Adult Health

## 2023-07-19 ENCOUNTER — Encounter: Payer: Self-pay | Admitting: Adult Health

## 2023-07-19 DIAGNOSIS — J168 Pneumonia due to other specified infectious organisms: Secondary | ICD-10-CM | POA: Diagnosis not present

## 2023-07-19 DIAGNOSIS — E8809 Other disorders of plasma-protein metabolism, not elsewhere classified: Secondary | ICD-10-CM | POA: Diagnosis not present

## 2023-07-19 DIAGNOSIS — D649 Anemia, unspecified: Secondary | ICD-10-CM | POA: Diagnosis not present

## 2023-07-19 DIAGNOSIS — J188 Other pneumonia, unspecified organism: Secondary | ICD-10-CM

## 2023-07-19 DIAGNOSIS — R278 Other lack of coordination: Secondary | ICD-10-CM | POA: Diagnosis not present

## 2023-07-19 DIAGNOSIS — I10 Essential (primary) hypertension: Secondary | ICD-10-CM | POA: Diagnosis not present

## 2023-07-19 DIAGNOSIS — R531 Weakness: Secondary | ICD-10-CM | POA: Diagnosis not present

## 2023-07-19 DIAGNOSIS — I480 Paroxysmal atrial fibrillation: Secondary | ICD-10-CM | POA: Diagnosis not present

## 2023-07-19 DIAGNOSIS — J189 Pneumonia, unspecified organism: Secondary | ICD-10-CM | POA: Diagnosis not present

## 2023-07-19 DIAGNOSIS — E871 Hypo-osmolality and hyponatremia: Secondary | ICD-10-CM | POA: Diagnosis not present

## 2023-07-19 DIAGNOSIS — R2689 Other abnormalities of gait and mobility: Secondary | ICD-10-CM | POA: Diagnosis not present

## 2023-07-19 MED ORDER — GUAIFENESIN-DM 100-10 MG/5ML PO SYRP: 15.0000 mL | ORAL_SOLUTION | Freq: Four times a day (QID) | ORAL | Status: DC | PRN

## 2023-07-19 MED ORDER — CEPACOL 15-2.3 MG MT LOZG
1.0000 | LOZENGE | Freq: Four times a day (QID) | OROMUCOSAL | Status: DC | PRN
Start: 2023-07-19 — End: 2023-10-12

## 2023-07-19 NOTE — Progress Notes (Signed)
 Location:  Medical illustrator of Service:  SNF (31) Provider:   Janace Mckusick, ANP [facility] [phone]   Bertha Broad, MD  Patient Care Team: Bertha Broad, MD as PCP - General (Internal Medicine) Eilleen Grates, MD as PCP - Cardiology (Cardiology) Lei Pump, MD as PCP - Electrophysiology (Cardiology)  Extended Emergency Contact Information Primary Emergency Contact: Genevieve Kerry Address: [address]          [place], [place] [address] [place] Home Phone: [phone] Mobile Phone: [phone] Relation: Spouse  Code Status:  Full Goals of care: Advanced Directive information    07/12/2023    2:38 AM  Advanced Directives  Does Patient Have a Medical Advance Directive? Yes  Type of Advance Directive Living will;Healthcare Power of [place];Out of facility DNR (pink MOST or yellow form)  Does patient want to make changes to medical advance directive? No - Patient declined  Copy of Healthcare Power of Attorney in Chart? No - copy requested     Chief Complaint  Patient presents with   hospital f/u    HPI:  Pt is a 78 y.o. male seen today for a hospital f/u s/p admission from 07/11/23-07/17/23 for multifocal pna. He was admitted with fever and confusion. CT of the chest showed multifocal pna. CT of head was normal. NA was 125 on admit 133 on discharge. Serum osmo 270 urin sodium low urin osmo 421. Nephrology recommended to stop IV fluids and start salt tablets for possible volume depletion SAIDH combination; development of some LE edema concerning for possible fluid retention. Lasix  IV started and Nephrology consulted. Lasix  continued in addition to sodium tablets.  2 D echo 07/17/23 showed EF 55-60% with grade 2 DD.  Mild anemia noted Hgb 10.9 07/16/23.  Alb 2.8.    He reports this is his 3 rd episode with pna. Initially, he had a high fever, which has since resolved, but he continues to experience  extreme fatigue. Even minimal activity, such as going to the bathroom, is exhausting. He has a persistent cough, particularly severe at night, significantly disrupting his sleep. He spent a night at home on Tuesday and noted that it was impossible for anyone to get any sleep due to his coughing.  He is currently on doxycycline  and Omnicef  following his hospital discharge. He uses an Asmanex  inhaler as needed and Tussionex syrup at night, which he finds helpful for his cough. He also uses Flonase , Azelastine , Singulair , and medication for reflux. He has an appointment with a pulmonologist scheduled for July 30th.  He has a history of asthma, atrial fibrillation, and has a hx of prostate ca. Certain medications are restricted due to his AFib. He recalls having pneumonia multiple times as a child. His legs have been swollen since his hospitalization, and he was on Lasix  for two days in the hospital, losing about eleven pounds. He is concerned about the swelling and mentions that his feet 'don't look like that normally.'  His cough is triggered by a 'tickle' in his throat, which makes him want to cough. 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 His sputum has varied in color, being clear, yellow, brown, and most recently grayish.  He is currently taking Ativan  0.5 mg every six hours prn for cough/anxiety , which he finds helpful.   Past Medical History:  Diagnosis Date   Aortic atherosclerosis (HCC)  Arthritis    Asthma    Diverticulitis    Diverticulosis    DVT (deep venous thrombosis) (HCC) 2019   right leg after back surgery   Dysrhythmia    A-fib every few weeks   Ectatic thoracic aorta (HCC)    a. 4.3cm by CT 03/2018.   GERD (gastroesophageal reflux disease)    Hemorrhoids    History of echocardiogram    a. Echo 10/16 [facility]):  EF 55%, trace MR, mild TR, mild to mod AI, mild dilated Ao root (41 mm)   Hyperlipemia    Organic erectile  dysfunction    PAF (paroxysmal atrial fibrillation) (HCC)    a. admx to [facility] in [place], [place] 16/10 with AF with RVR >> converted to NSR with IV Dilt;  b. Flecainide  started >> ETT neg for pro-arrhythmia    Pneumonia 06/2020   Prostate cancer (HCC) 04/25/2006   Gleason 3+4=7   Prostatitis    S/P cardiac cath    a. LHC at [facility] 10/16:  LM ok, mLAD 30%, LCx ok, dRCA 20%   S/P radiation therapy 01/19/2014 through 03/05/2014                                                      Prostate bed 6600 cGy in 33 sessions                           Torn rotator cuff    right  40% tear   Past Surgical History:  Procedure Laterality Date   ABLATION OF DYSRHYTHMIC FOCUS  03/28/2018   APPENDECTOMY  1967   ATRIAL FIBRILLATION ABLATION N/A 03/28/2018   Procedure: ATRIAL FIBRILLATION ABLATION;  Surgeon: Jolly Needle, MD;  Location: [facility] INVASIVE CV LAB;  Service: Cardiovascular;  Laterality: N/A;   ATRIAL FIBRILLATION ABLATION N/A 11/11/2019   Procedure: ATRIAL FIBRILLATION ABLATION;  Surgeon: Jolly Needle, MD;  Location: [facility] INVASIVE CV LAB;  Service: Cardiovascular;  Laterality: N/A;   ATRIAL FIBRILLATION ABLATION N/A 12/14/2021   Procedure: ATRIAL FIBRILLATION ABLATION;  Surgeon: Lei Pump, MD;  Location: [facility] INVASIVE CV LAB;  Service: Cardiovascular;  Laterality: N/A;   BACK SURGERY  2019   CARDIAC CATHETERIZATION  2016   [place]   CATARACT EXTRACTION W/ INTRAOCULAR LENS IMPLANT Left 11/2011   CATARACT EXTRACTION W/ INTRAOCULAR LENS IMPLANT Right 09/2015   COLONOSCOPY     LARYNX SURGERY  2000   vocal cord lesion- benign   LUMBAR LAMINECTOMY/DECOMPRESSION MICRODISCECTOMY Right 07/26/2017   Procedure: RIGHT LUMBAR 3- LUMBAR 4 FORAMINOTOMY WITH RESECTION OF SYNOVIAL CYST;  Surgeon: Manya Sells, MD;  Location: [facility] OR;  Service: Neurosurgery;  Laterality: Right;  RIGHT LUMBAR 3- LUMBAR 4 FORAMINOTOMY WITH RESECTION OF SYNOVIAL CYST   MENISCUS REPAIR Right    POLYPECTOMY      PROSTATE BIOPSY  1999   PROSTATECTOMY  04/25/2006   REVERSE SHOULDER ARTHROPLASTY Left 09/02/2020   Procedure: REVERSE SHOULDER ARTHROPLASTY;  Surgeon: Ellard Gunning, MD;  Location: [facility] ORS;  Service: Orthopedics;  Laterality: Left;     No Known Allergies  Outpatient Encounter Medications as of 07/19/2023  Medication Sig   acetaminophen  (TYLENOL ) 500 MG tablet Take 500 mg by mouth at bedtime.   apixaban  (ELIQUIS ) 5 MG TABS tablet Take 5  mg by mouth 2 (two) times daily.    Ascorbic Acid  (VITAMIN C  PO) Take 500 mg by mouth daily at 12 noon.   atorvastatin  (LIPITOR) 20 MG tablet Take 20 mg by mouth at bedtime.   Azelastine  HCl 137 MCG/SPRAY SOLN Place 1 spray into both nostrils 2 (two) times daily.   cefdinir  (OMNICEF ) 300 MG capsule Take 1 capsule (300 mg total) by mouth 2 (two) times daily for 4 days.   chlorpheniramine-HYDROcodone  (TUSSIONEX) 10-8 MG/5ML Take 5 mLs by mouth at bedtime as needed for up to 5 days for cough.   diltiazem  (CARDIZEM ) 30 MG tablet Take 1 tablet every 4 hours AS NEEDED for afib rapid heart rate over 100 (Patient taking differently: Take 30 mg by mouth every 4 (four) hours as needed (afib rapid heart rate over 100).)   dofetilide  (TIKOSYN ) 500 MCG capsule Take 1 capsule (500 mcg total) by mouth 2 (two) times daily.   doxycycline  (VIBRA -TABS) 100 MG tablet Take 1 tablet (100 mg total) by mouth every 12 (twelve) hours for 4 days.   EPINEPHrine  0.3 mg/0.3 mL IJ SOAJ injection Inject 0.3 mg into the muscle as needed for anaphylaxis.   fexofenadine (ALLEGRA ALLERGY) 180 MG tablet Take 1 tablet by mouth daily.   fluticasone  (FLONASE ) 50 MCG/ACT nasal spray Place 1 spray into both nostrils every evening.   GLUCOSAMINE-CHONDROITIN PO Take 1,500 mg by mouth daily.   ipratropium (ATROVENT ) 0.06 % nasal spray Place 2 sprays into both nostrils at bedtime.   losartan  (COZAAR ) 50 MG tablet Take 25 mg by mouth daily.   Lysine  500 MG TABS Take 1 tablet by mouth daily at 12 noon.    Mometasone  Furoate (ASMANEX  HFA) 100 MCG/ACT AERO Inhale 1 puff into the lungs daily as needed (wheezing).   montelukast  (SINGULAIR ) 10 MG tablet Take 10 mg by mouth at bedtime.   Naphazoline-Pheniramine (OPCON-A ) 0.027-0.315 % SOLN Place 1 drop into both eyes as needed (eye allergies (red/irritated eyes)).   omeprazole (PRILOSEC) 20 MG capsule Take 20 mg by mouth 2 (two) times daily before a meal.    potassium chloride  (KLOR-CON  M10) 10 MEQ tablet Take 3 tablets by mouth daily (Patient taking differently: Take 30 mEq by mouth daily.)   Probiotic Product (ALIGN) 4 MG CAPS Take 4 mg by mouth every evening.   psyllium (METAMUCIL) 58.6 % powder Take 1 packet by mouth in the morning and at bedtime.   VENTOLIN  HFA 108 (90 Base) MCG/ACT inhaler Inhale 1-2 puffs into the lungs every 6 (six) hours as needed for wheezing or shortness of breath.   VITAMIN D  PO Take 1,000 Units by mouth daily.   zolpidem  (AMBIEN ) 10 MG tablet Take 10 mg by mouth at bedtime as needed.   No facility-administered encounter medications on file as of 07/19/2023.    Review of Systems  Constitutional:  Positive for activity change and fatigue. Negative for appetite change, chills, diaphoresis, fever and unexpected weight change.  HENT:  Positive for congestion.   Respiratory:  Positive for cough and shortness of breath (intermittent). Negative for wheezing and stridor.   Cardiovascular:  Positive for leg swelling. Negative for chest pain and palpitations.  Gastrointestinal:  Negative for abdominal distention, abdominal pain, constipation and diarrhea.  Genitourinary:  Negative for difficulty urinating and dysuria.  Musculoskeletal:  Positive for gait problem. Negative for arthralgias, back pain, joint swelling and myalgias.  Neurological:  Negative for dizziness, seizures, syncope, facial asymmetry, speech difficulty, weakness and headaches.  Hematological:  Negative for  adenopathy. Does not bruise/bleed easily.   Psychiatric/Behavioral:  Negative for agitation, behavioral problems and confusion.     Immunization History  Administered Date(s) Administered   Fluad Quad(high Dose 65+) 12/20/2021   Influenza Split 12/24/2009, 11/25/2010, 12/21/2011, 12/20/2012, 12/18/2013   Influenza, High Dose Seasonal PF 04/14/2015, 04/13/2016, 12/25/2016, 12/18/2017, 04/16/2018, 12/10/2018, 04/12/2020   Influenza,inj,Quad PF,6+ Mos 12/20/2012, 12/18/2013, 12/12/2014   Influenza,inj,quad, With Preservative 12/25/2016   Moderna Sars-Covid-2 Vaccination 04/29/2019, 01/15/2020, 04/12/2020, 06/24/2020   Pneumococcal Conjugate-13 01/10/2013   Pneumococcal Polysaccharide-23 12/24/2009, 03/19/2015, 04/13/2016, 04/16/2018, 04/12/2020   Pneumococcal-Unspecified 03/20/2006   Td 12/24/2009   Zoster, Live 01/10/2013, 10/13/2016   Pertinent  Health Maintenance Due  Topic Date Due   INFLUENZA VACCINE  10/19/2023   Colonoscopy  Discontinued      11/28/2021    7:01 PM 11/29/2021    7:10 AM 11/29/2021    7:10 PM 11/30/2021   11:00 AM 12/14/2021    9:47 AM  Fall Risk  (RETIRED) Patient Fall Risk Level Low fall risk Low fall risk Low fall risk Low fall risk Low fall risk   Functional Status Survey:    Vitals:   07/19/23 1205  BP: 133/80  Pulse: 77  Resp: 16  Temp: 98.8 F (37.1 C)  SpO2: 97%  Weight: 212 lb (96.2 kg)   Body mass index is 30.42 kg/m. Physical Exam Vitals and nursing note reviewed.  Constitutional:      Appearance: Normal appearance.  HENT:     Head: Normocephalic and atraumatic.     Mouth/Throat:     Mouth: Mucous membranes are moist.     Pharynx: Oropharynx is clear.  Eyes:     Conjunctiva/sclera: Conjunctivae normal.     Pupils: Pupils are equal, round, and reactive to light.  Cardiovascular:     Rate and Rhythm: Normal rate and regular rhythm.     Heart sounds: No murmur heard. Pulmonary:     Effort: Pulmonary effort is normal. No respiratory distress.     Breath sounds: Normal  breath sounds. No wheezing.  Abdominal:     General: Bowel sounds are normal. There is no distension.     Palpations: Abdomen is soft.     Tenderness: There is no abdominal tenderness.  Musculoskeletal:     Cervical back: No rigidity.     Right lower leg: Edema present.     Left lower leg: Edema present.  Lymphadenopathy:     Cervical: No cervical adenopathy.  Skin:    General: Skin is warm and dry.  Neurological:     General: No focal deficit present.     Mental Status: He is alert and oriented to person, place, and time. Mental status is at baseline.  Psychiatric:        Mood and Affect: Mood normal.     Labs reviewed: Recent Labs    07/12/23 0633 07/12/23 1226 07/13/23 0338 07/13/23 1233 07/15/23 0352 07/15/23 1338 07/16/23 0324 07/16/23 1254 07/16/23 2058 07/17/23 0352  NA 125*   < > 124*   < > 128*   < > 128* 128* 132* 133*  K 3.4*  --  3.8   < > 3.5  --  4.1  --   --  3.7  CL 97*  --  95*   < > 94*  --  98  --   --  99  CO2 20*  --  20*   < > 24  --  18*  --   --  26  GLUCOSE 149*  --  114*   < > 139*  --  120*  --   --  133*  BUN 8  --  10   < > 10  --  11  --   --  14  CREATININE 0.70  --  0.95   < > 0.86  --  0.87  --   --  0.97  CALCIUM  8.0*  --  7.6*   < > 8.2*  --  7.8*  --   --  8.4*  MG 1.9  --  1.8  --   --   --  2.1  --   --   --    < > = values in this interval not displayed.   Recent Labs    07/11/23 2150 07/12/23 0633 07/13/23 0338  AST 48* 56* 60*  ALT 24 28 29   ALKPHOS 66 55 50  BILITOT 0.9 1.3* 0.9  PROT 6.1* 6.0* 5.5*  ALBUMIN 3.8 3.3* 2.8*   Recent Labs    07/11/23 2150 07/12/23 0633 07/13/23 0338 07/15/23 0352 07/16/23 0324  WBC 11.0*   < > 10.4 13.8* 11.0*  NEUTROABS 8.3*  --   --   --   --   HGB 12.1*   < > 13.1 11.6* 10.9*  HCT 34.3*   < > 37.1* 34.1* 31.6*  MCV 89.6   < > 91.6 92.9 91.6  PLT 131*   < > 116* 166 167   < > = values in this interval not displayed.   Lab Results  Component Value Date   TSH 0.705  07/16/2023   No results found for: "HGBA1C" No results found for: "CHOL", "HDL", "LDLCALC", "LDLDIRECT", "TRIG", "CHOLHDL"  Significant Diagnostic Results in last 30 days:  CT Head Wo Contrast Result Date: 07/11/2023 CLINICAL DATA:  Altered mental status EXAM: CT HEAD WITHOUT CONTRAST TECHNIQUE: Contiguous axial images were obtained from the base of the skull through the vertex without intravenous contrast. RADIATION DOSE REDUCTION: This exam was performed according to the departmental dose-optimization program which includes automated exposure control, adjustment of the mA and/or kV according to patient size and/or use of iterative reconstruction technique. COMPARISON:  None Available. FINDINGS: Brain: No mass,hemorrhage or extra-axial collection. Normal appearance of the parenchyma and CSF spaces. Vascular: No hyperdense vessel or unexpected vascular calcification. Skull: The visualized skull base, calvarium and extracranial soft tissues are normal. Sinuses/Orbits: No fluid levels or advanced mucosal thickening of the visualized paranasal sinuses. No mastoid or middle ear effusion. Normal orbits. Other: None. IMPRESSION: Normal head CT. Electronically Signed   By: Juanetta Nordmann M.D.   On: 07/11/2023 23:35   DG Chest Port 1 View Result Date: 07/11/2023 CLINICAL DATA:  Sepsis. EXAM: PORTABLE CHEST 1 VIEW COMPARISON:  01/19/2023 FINDINGS: The cardiac silhouette, mediastinal and hilar contours are within normal limits. Streaky bibasilar density could be atelectasis or early infiltrates. No pleural effusions. No pulmonary lesions. The bony thorax is intact. Stable left shoulder arthroplasty. IMPRESSION: Streaky bibasilar density could be atelectasis or early infiltrates. Electronically Signed   By: Marrian Siva M.D.   On: 07/11/2023 22:01    Assessment/Plan  Multifocal Pneumonia Third episode in over three years with high fever, fatigue, and persistent cough. Receiving doxycycline  and Omnicef   post-hospitalization. Follow-up with pulmonologist pending. Nocturnal cough affecting sleep. May need F/u CT of the chest - Continue doxycycline  and Omnicef . - Follow up with pulmonologist. - Use Tussionex syrup at night for cough. - Add Robitussin DM  -  Use Cepacol lozenges to suppress cough. On PPI, nasal sprays, allergra, and singulair    Weakness Due to acute illness, anemia Here in skilled rehab for physical therapy  Asthma Hx of mild asthma. Has asmanex  inhaler prn  Also has an allergist on maximal therapy  Atrial Fibrillation Chronic  on eliquis  Rate control with TIkosyn  and Cardizem  Followed by cardiology   Edema Bilateral leg edema likely multifactorial due to recent hospitalization, low sodium, diastolic dysfunction, and low albumin. History of significant weight fluctuation during hospital stay. - Use compression hose. - Elevate legs. - Consider diuretic if edema persists and electrolytes are stable. - Recheck electrolytes.  Anemia Mild anemia with hemoglobin of 10.9 at discharge, likely related to recent illness, contributing to fatigue and weakness. - Repeat CBC  Low Albumin Low albumin levels common after severe illness, contributing to edema. Body expected to rebuild protein stores as recovery progresses. - Monitor nutritional intake to support protein rebuilding.  Hyponatremia In the setting of an acute illness Recheck BMP  HTN Controlled  Noted prior hx of prostate ca s/p prostatectomy. Neg PET SCan 02/05/23  Labs/tests ordered:  CBC CMP in am

## 2023-07-19 NOTE — Telephone Encounter (Signed)
 Per pt's chart- HFU is scheduled 07/31/2023

## 2023-07-20 ENCOUNTER — Ambulatory Visit: Admitting: Pulmonary Disease

## 2023-07-20 ENCOUNTER — Encounter: Payer: Self-pay | Admitting: Pulmonary Disease

## 2023-07-20 ENCOUNTER — Ambulatory Visit (INDEPENDENT_AMBULATORY_CARE_PROVIDER_SITE_OTHER)

## 2023-07-20 ENCOUNTER — Other Ambulatory Visit: Payer: Self-pay | Admitting: Adult Health

## 2023-07-20 ENCOUNTER — Telehealth: Payer: Self-pay | Admitting: Adult Health

## 2023-07-20 ENCOUNTER — Other Ambulatory Visit: Payer: Self-pay | Admitting: Pulmonary Disease

## 2023-07-20 VITALS — BP 137/75 | HR 74 | Ht 70.0 in | Wt 212.0 lb

## 2023-07-20 DIAGNOSIS — E871 Hypo-osmolality and hyponatremia: Secondary | ICD-10-CM | POA: Diagnosis not present

## 2023-07-20 DIAGNOSIS — J45909 Unspecified asthma, uncomplicated: Secondary | ICD-10-CM | POA: Diagnosis not present

## 2023-07-20 DIAGNOSIS — R051 Acute cough: Secondary | ICD-10-CM

## 2023-07-20 DIAGNOSIS — J189 Pneumonia, unspecified organism: Secondary | ICD-10-CM | POA: Diagnosis not present

## 2023-07-20 DIAGNOSIS — R278 Other lack of coordination: Secondary | ICD-10-CM | POA: Diagnosis not present

## 2023-07-20 DIAGNOSIS — J90 Pleural effusion, not elsewhere classified: Secondary | ICD-10-CM | POA: Diagnosis not present

## 2023-07-20 DIAGNOSIS — J168 Pneumonia due to other specified infectious organisms: Secondary | ICD-10-CM | POA: Diagnosis not present

## 2023-07-20 DIAGNOSIS — Z96612 Presence of left artificial shoulder joint: Secondary | ICD-10-CM | POA: Diagnosis not present

## 2023-07-20 DIAGNOSIS — R918 Other nonspecific abnormal finding of lung field: Secondary | ICD-10-CM | POA: Diagnosis not present

## 2023-07-20 DIAGNOSIS — I4891 Unspecified atrial fibrillation: Secondary | ICD-10-CM

## 2023-07-20 DIAGNOSIS — R2689 Other abnormalities of gait and mobility: Secondary | ICD-10-CM | POA: Diagnosis not present

## 2023-07-20 DIAGNOSIS — K219 Gastro-esophageal reflux disease without esophagitis: Secondary | ICD-10-CM

## 2023-07-20 LAB — CBC: RBC: 3.47 — AB (ref 3.87–5.11)

## 2023-07-20 LAB — HEPATIC FUNCTION PANEL
ALT: 147 U/L — AB (ref 10–40)
AST: 105 — AB (ref 14–40)
Alkaline Phosphatase: 115 (ref 25–125)
Bilirubin, Total: 0.4

## 2023-07-20 LAB — CBC AND DIFFERENTIAL
HCT: 32 — AB (ref 41–53)
Hemoglobin: 11 — AB (ref 13.5–17.5)
Platelets: 7 10*3/uL — AB (ref 150–400)
WBC: 7.3

## 2023-07-20 LAB — COMPREHENSIVE METABOLIC PANEL WITH GFR
Albumin: 2.8 — AB (ref 3.5–5.0)
Calcium: 8.4 — AB (ref 8.7–10.7)
Globulin: 2.6
eGFR: 90

## 2023-07-20 LAB — BASIC METABOLIC PANEL WITH GFR
BUN: 10 (ref 4–21)
CO2: 23 — AB (ref 13–22)
Chloride: 99 (ref 99–108)
Creatinine: 0.8 (ref 0.6–1.3)
Glucose: 114
Potassium: 4.1 meq/L (ref 3.5–5.1)
Sodium: 133 — AB (ref 137–147)

## 2023-07-20 MED ORDER — TRELEGY ELLIPTA 200-62.5-25 MCG/ACT IN AEPB
INHALATION_SPRAY | RESPIRATORY_TRACT | Status: DC
Start: 2023-07-20 — End: 2023-10-12

## 2023-07-20 MED ORDER — HYDROCOD POLI-CHLORPHE POLI ER 10-8 MG/5ML PO SUER: 5.0000 mL | Freq: Every evening | ORAL | 0 refills | Status: AC | PRN

## 2023-07-20 NOTE — Telephone Encounter (Signed)
 Labs returned NA 133 which is stable.  LFTs were elevated ALT 147 AST 105 Alk phos 115 T bil 0.4 Increasing from prior LFTs during his hospitalization. Has edema. No abd pain or nausea per nurse.  He does not have an obstructive pattern. Did have recent illness. Will recheck CMP on Monday and hold statin for the weekend.

## 2023-07-20 NOTE — Patient Instructions (Signed)
 VISIT SUMMARY:  During your visit today, we discussed your ongoing symptoms following your recent hospitalization for multifocal pneumonia. You have been experiencing persistent cough, shortness of breath, and fatigue. We reviewed your current medications and made some adjustments to better manage your symptoms.  YOUR PLAN:  -ASTHMA: Asthma is a condition where your airways narrow and swell, making it difficult to breathe. Your asthma, which was previously well-controlled, is likely being exacerbated by your recent pneumonia. We are discontinuing Asmanex  and starting you on a Trelegy inhaler once daily for 2-4 weeks. You will also have albuterol  as a rescue inhaler if needed.  -MULTIFOCAL PNEUMONIA: Multifocal pneumonia is an infection that affects multiple areas of your lungs. Your recent pneumonia has shown improvement, but you still have some inflammation. Continue your current antibiotics until they are finished. You can use Mucinex  or Mucinex  DM to help clear mucus, and we are providing you with a flutter valve to aid in mucus clearance. If your symptoms do not improve, we may consider a low-dose prednisone  as a last resort.  -ATRIAL FIBRILLATION: Atrial fibrillation is an irregular and often rapid heart rate that can increase your risk of strokes and other heart-related complications. Your condition is managed with Tikosyn , and we are avoiding prednisone  due to its potential adverse effects on your heart condition.  -ACID REFLUX: Acid reflux occurs when stomach acid frequently flows back into the tube connecting your mouth and stomach. Your acid reflux is well-managed with omeprazole, but it may be contributing to your cough. If needed, you can add over counter Pepcid  at night for additional control.  INSTRUCTIONS:  Please keep the follow-up appointment on Jul 31, 2023. Contact us  if your symptoms worsen or if you have any additional issues.

## 2023-07-20 NOTE — Progress Notes (Unsigned)
 Brandon Simmons    [id number]    12-21-1945  Primary Care Physician:Paterson, Cris Dollar, MD  Referring Physician: Bertha Broad, MD [address] [place],  [place] [address]   Chief complaint: Follow-up mild asthma, acute visit after hospitalization for pneumonia  HPI: 78 year old with history of arrhythmias, asthma, prostate cancer, DVT Developed community-acquired pneumonia in mid April.  Symptoms started off with a upper respiratory viral infection and proceeded to cough, fevers of 102.  He was evaluated in the ED on 4/22 and given amoxicillin  with no improvement.  He was reevaluated on 4/24 and doxycycline  added.  He got a round of prednisone  as well.  He was given breztri from his primary care doctor but had to stop after few days as he developed sore throat and hoarseness.  He then got levofloxacin  and prednisone  for 7 days.  Unfortunately developed atrial fibrillation transiently while on prednisone  but converted back to sinus rhythm.  Overall improved with this treatment  History notable for mild asthma for which he sees Dr. Phill Brazil at allergy center.  Treated with Asmanex  inhaler, albuterol . He also had a DVT in 2019 in the setting of surgery and was treated with anticoagulants  Developed COVID-19 in October 2022 after a trip to [place].  Treated with Molnupiravir but has persistent cough since then.    Admitted to [facility] in September 2013 for community-acquired pneumonia with right upper lobe and middle lobe infiltrate.  Treated with Rocephin  and doxycycline  with negative cultures.  Switch to Omnicef  on discharge for total of 7 days of therapy.  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  No feather pillows or comforters Smoking history: Minimal smoking history in his 57s Travel  history: No significant travel history Relevant family history: No family history of lung disease  Interim history: Discussed the use of AI scribe software for clinical note transcription with the patient, who gave verbal consent to proceed.  History of Present Illness Brandon Simmons. "Primus Brookes" is a 78 year old male with asthma and atrial fibrillation who presents with persistent cough and shortness of breath following a recent hospitalization for multifocal pneumonia.  He was hospitalized on July 11, 2023, for multifocal pneumonia affecting three different lobes of his lungs and was discharged with a four-day course of doxycycline  and cefdinir , which ends tomorrow. Despite treatment, he continues to experience significant symptoms.  His high fevers and delirium have resolved, but he continues to struggle with shortness of breath and a persistent cough, particularly at night, which disrupts his sleep. He describes experiencing 'horrible cough jags' that wake him up. He also reports feeling 'terribly fatigued' and needing to rest frequently during the day.  He is currently taking Asmanex , Flonase , Singulair , and Ativan  for anxiety. Ativan  has been effective in managing his anxiety related to coughing fits until recently. He also uses Tustinx for his cough and has been taking Mucinex  intermittently to help with mucus production.  He describes the mucus he is coughing up as brownish in color. He is also on Tikosyn  for atrial fibrillation, which limits the medications he can take. Additionally, he takes omeprazole twice daily for acid reflux, which he reports has been well-controlled.  His past medical history includes asthma, which was previously well-controlled with Asmanex , Flonase , and Singulair . He also  has a history of atrial fibrillation, for which he takes Tikosyn . He has experienced pneumonia in the past and is concerned about the severity of his current illness compared to previous  episodes.    Outpatient Encounter Medications as of 07/20/2023  Medication Sig   acetaminophen  (TYLENOL ) 500 MG tablet Take 500 mg by mouth at bedtime.   apixaban  (ELIQUIS ) 5 MG TABS tablet Take 5 mg by mouth 2 (two) times daily.    Ascorbic Acid  (VITAMIN C  PO) Take 500 mg by mouth daily at 12 noon.   atorvastatin  (LIPITOR) 20 MG tablet Take 20 mg by mouth at bedtime.   Azelastine  HCl 137 MCG/SPRAY SOLN Place 1 spray into both nostrils 2 (two) times daily.   Benzocaine -Menthol  (CEPACOL) 15-2.3 MG LOZG Use as directed 1 lozenge in the mouth or throat every 6 (six) hours as needed.   cefdinir  (OMNICEF ) 300 MG capsule Take 1 capsule (300 mg total) by mouth 2 (two) times daily for 4 days.   chlorpheniramine-HYDROcodone  (TUSSIONEX) 10-8 MG/5ML Take 5 mLs by mouth at bedtime as needed for up to 5 days for cough.   diltiazem  (CARDIZEM ) 30 MG tablet Take 1 tablet every 4 hours AS NEEDED for afib rapid heart rate over 100 (Patient taking differently: Take 30 mg by mouth every 4 (four) hours as needed (afib rapid heart rate over 100).)   dofetilide  (TIKOSYN ) 500 MCG capsule Take 1 capsule (500 mcg total) by mouth 2 (two) times daily.   doxycycline  (VIBRA -TABS) 100 MG tablet Take 1 tablet (100 mg total) by mouth every 12 (twelve) hours for 4 days.   EPINEPHrine  0.3 mg/0.3 mL IJ SOAJ injection Inject 0.3 mg into the muscle as needed for anaphylaxis.   fexofenadine (ALLEGRA ALLERGY) 180 MG tablet Take 1 tablet by mouth daily.   fluticasone  (FLONASE ) 50 MCG/ACT nasal spray Place 1 spray into both nostrils every evening.   GLUCOSAMINE-CHONDROITIN PO Take 1,500 mg by mouth daily.   guaiFENesin -dextromethorphan  (ROBITUSSIN DM) 100-10 MG/5ML syrup Take 15 mLs by mouth every 6 (six) hours as needed for cough.   ipratropium (ATROVENT ) 0.06 % nasal spray Place 2 sprays into both nostrils at bedtime.   losartan  (COZAAR ) 50 MG tablet Take 25 mg by mouth daily.   Lysine  500 MG TABS Take 1 tablet by mouth daily at 12  noon.   Mometasone  Furoate (ASMANEX  HFA) 100 MCG/ACT AERO Inhale 1 puff into the lungs daily as needed (wheezing).   montelukast  (SINGULAIR ) 10 MG tablet Take 10 mg by mouth at bedtime.   Naphazoline-Pheniramine (OPCON-A ) 0.027-0.315 % SOLN Place 1 drop into both eyes as needed (eye allergies (red/irritated eyes)).   omeprazole (PRILOSEC) 20 MG capsule Take 20 mg by mouth 2 (two) times daily before a meal.    potassium chloride  (KLOR-CON  M10) 10 MEQ tablet Take 3 tablets by mouth daily (Patient taking differently: Take 30 mEq by mouth daily.)   Probiotic Product (ALIGN) 4 MG CAPS Take 4 mg by mouth every evening.   psyllium (METAMUCIL) 58.6 % powder Take 1 packet by mouth in the morning and at bedtime.   VENTOLIN  HFA 108 (90 Base) MCG/ACT inhaler Inhale 1-2 puffs into the lungs every 6 (six) hours as needed for wheezing or shortness of breath.   VITAMIN D  PO Take 1,000 Units by mouth daily.   zolpidem  (AMBIEN ) 10 MG tablet Take 10 mg by mouth at bedtime as needed.   No facility-administered encounter medications on file as of 07/20/2023.    Physical Exam: Blood pressure  137/75, pulse 74, height 5\' 10"  (1.778 m), weight 212 lb (96.2 kg), SpO2 95%. Gen:      No acute distress HEENT:  EOMI, sclera anicteric Neck:     No masses; no thyromegaly Lungs:     CV:         Regular rate and rhythm; no murmurs Abd:      + bowel sounds; soft, non-tender; no palpable masses, no distension Ext:    No edema; adequate peripheral perfusion Skin:      Warm and dry; no rash Neuro: alert and oriented x 3 Psych: normal mood and affect   Data Reviewed: Imaging: Chest x-ray 07/09/2020- minimal basal atelectasis Chest x-ray 07/11/2020- patchy bibasilar opacities left greater than right Chest x-ray report from primary care 08/24/2020-slight improvement in bilateral infiltrates PET scan 10/13/2020-pulmonary nodule in the left lower lobe with limited uptake.  Architectural distortion in the left lower lobe. Chest  x-ray 02/17/2021-persistent left lower lobe opacity CT chest 02/25/2021- 4 mm nodule at the left lung base.  New small nodules in the inferior lingula, ascending thoracic aortic aneurysm. Chest x-ray 11/27/2021- dense right upper lobe airspace disease Chest x-ray 11/29/2021- Improving right upper lobe opacity Chest x-ray 12/08/2021- Improving right upper lobe infiltrate Cardiac CT 12/08/2021-development of right upper and middle lobe airspace disease and groundglass opacities. Chest x-ray 01/20/2022-improvement in right upper lobe opacity. I have reviewed the images personally.  PFTs:   Labs:  Assessment:  Asthma Well controlled with current regimen. No recent asthma attacks. Medications include Asmanex  (1 puff nightly), Flonase , Azelastine , Ipatropium (various doses daily), and Singulair . -Continue current regimen. -No need for rescue inhaler use. - Follow with Dr. Phill Brazil, Allergy  Follow-up Given the stable condition, no need for immediate follow-up. -Schedule annual follow-up appointment. -If any changes or concerns arise, patient to contact office for sooner appointment.    Plan/Reccmmendations: Continue Asmanex , Singulair   Follow-up in 1 year  Phyllis Breeze MD [facility] 07/20/2023, 10:19 AM  CC: Bertha Broad, MD

## 2023-07-23 ENCOUNTER — Non-Acute Institutional Stay (SKILLED_NURSING_FACILITY): Payer: Self-pay | Admitting: Adult Health

## 2023-07-23 ENCOUNTER — Encounter: Payer: Self-pay | Admitting: Adult Health

## 2023-07-23 DIAGNOSIS — R053 Chronic cough: Secondary | ICD-10-CM

## 2023-07-23 DIAGNOSIS — R7989 Other specified abnormal findings of blood chemistry: Secondary | ICD-10-CM

## 2023-07-23 DIAGNOSIS — R2689 Other abnormalities of gait and mobility: Secondary | ICD-10-CM | POA: Diagnosis not present

## 2023-07-23 DIAGNOSIS — J168 Pneumonia due to other specified infectious organisms: Secondary | ICD-10-CM | POA: Diagnosis not present

## 2023-07-23 DIAGNOSIS — R600 Localized edema: Secondary | ICD-10-CM

## 2023-07-23 DIAGNOSIS — E871 Hypo-osmolality and hyponatremia: Secondary | ICD-10-CM | POA: Diagnosis not present

## 2023-07-23 DIAGNOSIS — J189 Pneumonia, unspecified organism: Secondary | ICD-10-CM | POA: Diagnosis not present

## 2023-07-23 DIAGNOSIS — J4521 Mild intermittent asthma with (acute) exacerbation: Secondary | ICD-10-CM

## 2023-07-23 DIAGNOSIS — I480 Paroxysmal atrial fibrillation: Secondary | ICD-10-CM

## 2023-07-23 DIAGNOSIS — K219 Gastro-esophageal reflux disease without esophagitis: Secondary | ICD-10-CM

## 2023-07-23 DIAGNOSIS — E876 Hypokalemia: Secondary | ICD-10-CM | POA: Diagnosis not present

## 2023-07-23 DIAGNOSIS — R278 Other lack of coordination: Secondary | ICD-10-CM | POA: Diagnosis not present

## 2023-07-23 NOTE — Progress Notes (Signed)
 Location:  [facility] Room Number: 155 A Place of Service:  SNF [phone])  Provider: Raylene Calamity, NP   PCP: Bertha Broad, MD Patient Care Team: Bertha Broad, MD as PCP - General (Internal Medicine) Eilleen Grates, MD as PCP - Cardiology (Cardiology) Lei Pump, MD as PCP - Electrophysiology (Cardiology)  Extended Emergency Contact Information Primary Emergency Contact: Brandon Simmons Address: [address]          [place], [place] [address] [place] Home Phone: [phone] Mobile Phone: [phone] Relation: Spouse  Code Status: Full Code Goals of care:  Advanced Directive information    07/12/2023    2:38 AM  Advanced Directives  Does Patient Have a Medical Advance Directive? Yes  Type of Advance Directive Living will;Healthcare Power of [place];Out of facility DNR (pink MOST or yellow form)  Does patient want to make changes to medical advance directive? No - Patient declined  Copy of Healthcare Power of Attorney in Chart? No - copy requested     No Known Allergies  Chief Complaint  Patient presents with   Discharge Note    HPI:  78 y.o. 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 He is walking with a walker. Therapy has evaluated and feel like he is safe to return home. He continues with a dry cough. Now on trelegy and mucinex  dm. Also using ativan  and prn tussionex for the cough.   Continues with leg edema. Wanting something to help. His compression hose never arrived. No cp or doe.   LFTs were elevated on recent blood work. No abd pain or fever or nausea. Normal bilirubin. Normal alk phos.  ?due to acute illness. Statin on hold. Repeat CMP pending.  ALT 147 AST 105  Back ground:  hospital f/u s/p admission from 07/11/23-07/17/23 for multifocal pna.  He was admitted with fever and confusion. CT of the chest showed multifocal pna. CT of head was normal. NA was 125 on admit 133 on discharge. Serum osmo 270 urin sodium low urin osmo 421. Nephrology recommended to stop IV fluids and start salt tablets for possible volume depletion SAIDH combination; development of some LE edema concerning for possible fluid retention. Lasix  IV started and Nephrology consulted. Lasix  continued in addition to sodium tablets.  2 D echo 07/17/23 showed EF 55-60% with grade 2 DD.  Mild anemia noted Hgb 10.9 07/16/23.  Alb 2.8.     Past Medical History:  Diagnosis Date   Aortic atherosclerosis (HCC)    Arthritis    Asthma    Diverticulitis    Diverticulosis    DVT (deep venous thrombosis) (HCC) 2019   right leg after back surgery   Dysrhythmia    A-fib every few weeks   Ectatic thoracic aorta (HCC)    a. 4.3cm by CT 03/2018.   GERD (gastroesophageal reflux disease)    Hemorrhoids    History of echocardiogram    a. Echo 10/16 [facility]):  EF 55%, trace MR, mild TR, mild to mod AI, mild dilated Ao root (41 mm)   Hyperlipemia    Organic erectile dysfunction    PAF (paroxysmal atrial fibrillation) (HCC)    a. admx to [facility] in [place], [place] [age] with AF with RVR >> converted to NSR with IV Dilt;  b. Flecainide  started >>  ETT neg for pro-arrhythmia    Pneumonia 06/2020   Prostate cancer (HCC) 04/25/2006   Gleason 3+4=7   Prostatitis    S/P cardiac cath    a. LHC at [facility] 10/16:  LM ok, mLAD 30%, LCx ok, dRCA 20%   S/P radiation therapy 01/19/2014 through 03/05/2014                                                      Prostate bed 6600 cGy in 33 sessions                           Torn rotator cuff    right  40% tear    Past Surgical History:  Procedure Laterality Date   ABLATION OF DYSRHYTHMIC FOCUS  03/28/2018   APPENDECTOMY  1967   ATRIAL FIBRILLATION ABLATION N/A 03/28/2018   Procedure: ATRIAL FIBRILLATION ABLATION;  Surgeon:  Jolly Needle, MD;  Location: [facility] INVASIVE CV LAB;  Service: Cardiovascular;  Laterality: N/A;   ATRIAL FIBRILLATION ABLATION N/A 11/11/2019   Procedure: ATRIAL FIBRILLATION ABLATION;  Surgeon: Jolly Needle, MD;  Location: [facility] INVASIVE CV LAB;  Service: Cardiovascular;  Laterality: N/A;   ATRIAL FIBRILLATION ABLATION N/A 12/14/2021   Procedure: ATRIAL FIBRILLATION ABLATION;  Surgeon: Lei Pump, MD;  Location: [facility] INVASIVE CV LAB;  Service: Cardiovascular;  Laterality: N/A;   BACK SURGERY  2019   CARDIAC CATHETERIZATION  2016   [place]   CATARACT EXTRACTION W/ INTRAOCULAR LENS IMPLANT Left 11/2011   CATARACT EXTRACTION W/ INTRAOCULAR LENS IMPLANT Right 09/2015   COLONOSCOPY     LARYNX SURGERY  2000   vocal cord lesion- benign   LUMBAR LAMINECTOMY/DECOMPRESSION MICRODISCECTOMY Right 07/26/2017   Procedure: RIGHT LUMBAR 3- LUMBAR 4 FORAMINOTOMY WITH RESECTION OF SYNOVIAL CYST;  Surgeon: Manya Sells, MD;  Location: [facility] OR;  Service: Neurosurgery;  Laterality: Right;  RIGHT LUMBAR 3- LUMBAR 4 FORAMINOTOMY WITH RESECTION OF SYNOVIAL CYST   MENISCUS REPAIR Right    POLYPECTOMY     PROSTATE BIOPSY  1999   PROSTATECTOMY  04/25/2006   REVERSE SHOULDER ARTHROPLASTY Left 09/02/2020   Procedure: REVERSE SHOULDER ARTHROPLASTY;  Surgeon: Ellard Gunning, MD;  Location: [facility] ORS;  Service: Orthopedics;  Laterality: Left;       reports that he quit smoking about 53 years ago. His smoking use included cigarettes. He started smoking about 63 years ago. He has a 10 pack-year smoking history. He has never used smokeless tobacco. He reports that he does not currently use alcohol . He reports that he does not use drugs. Social History   Socioeconomic History   Marital status: Married    Spouse name: Brandon Simmons   Number of children: 2   Years of education: Not on file   Highest education level: Not on file  Occupational History   Occupation: Proofreader [employer]   Occupation: Research scientist (medical)  Tobacco Use   Smoking  status: Former    Current packs/day: 0.00    Average packs/day: 1 pack/day for 10.0 years (10.0 ttl pk-yrs)    Types: Cigarettes    Start date: 38    Quit date: 1972    Years since quitting: 53.3   Smokeless tobacco: Never   Tobacco comments:    Former smoker 03/15/22  Vaping Use   Vaping status:  Never Used  Substance and Sexual Activity   Alcohol  use: Not Currently   Drug use: No   Sexual activity: Not on file  Other Topics Concern   Not on file  Social History Narrative   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  Consultant for schools.   Travels over seas    Social Drivers of Health   Financial Resource Strain: Not on file  Food Insecurity: No Food Insecurity (07/12/2023)   Hunger Vital Sign    Worried About Running Out of Food in the Last Year: Never true    Ran Out of Food in the Last Year: Never true  Transportation Needs: No Transportation Needs (07/12/2023)   PRAPARE - Administrator, Civil Service (Medical): No    Lack of Transportation (Non-Medical): No  Physical Activity: Not on file  Stress: Not on file  Social Connections: Socially Integrated (07/12/2023)   Social Connection and Isolation Panel [NHANES]    Frequency of Communication with Friends and Family: More than three times a week    Frequency of Social Gatherings with Friends and Family: More than three times a week    Attends Religious Services: More than 4 times per year    Active Member of [employer] or Organizations: Yes    Attends Engineer, structural: More than 4 times per year    Marital Status: Married  Catering manager Violence: Not At Risk (07/12/2023)   Humiliation, Afraid, Rape, and Kick questionnaire    Fear of Current or Ex-Partner: No    Emotionally Abused: No    Physically Abused: No    Sexually Abused: No   Functional Status Survey:    No Known Allergies  Pertinent  Health Maintenance Due  Topic Date Due   INFLUENZA VACCINE  10/19/2023   Colonoscopy  Discontinued     Medications: Outpatient Encounter Medications as of 07/23/2023  Medication Sig   apixaban  (ELIQUIS ) 5 MG TABS tablet Take 5 mg by mouth 2 (two) times daily.    Ascorbic Acid  (VITAMIN C  PO) Take 500 mg by mouth daily at 12 noon.   atorvastatin  (LIPITOR) 20 MG tablet Take 20 mg by mouth at bedtime.   Azelastine  HCl 137 MCG/SPRAY SOLN Place 1 spray into both nostrils 2 (two) times daily.   Benzocaine -Menthol  (CEPACOL) 15-2.3 MG LOZG Use as directed 1 lozenge in the mouth or throat every 6 (six) hours as needed.   diltiazem  (CARDIZEM ) 30 MG tablet Take 1 tablet every 4 hours AS NEEDED for afib rapid heart rate over 100 (Patient taking differently: Take 30 mg by mouth every 4 (four) hours as needed (afib rapid heart rate over 100).)   dofetilide  (TIKOSYN ) 500 MCG capsule Take 1 capsule (500 mcg total) by mouth 2 (two) times daily.   EPINEPHrine  0.3 mg/0.3 mL IJ SOAJ injection Inject 0.3 mg into the muscle as needed for anaphylaxis.   famotidine  (PEPCID ) 20 MG tablet Take 20 mg by mouth at bedtime. 20 mg, oral, At bedtime   fexofenadine (ALLEGRA ALLERGY) 180 MG tablet Take 1 tablet by mouth daily.   fluticasone  (FLONASE ) 50 MCG/ACT nasal spray Place 1 spray into both nostrils every evening.   Fluticasone -Umeclidin-Vilant (TRELEGY ELLIPTA) 200-62.5-25 MCG/ACT AEPB 2 samples   GLUCOSAMINE-CHONDROITIN PO Take 1,500 mg by mouth daily.   guaiFENesin -dextromethorphan  (ROBITUSSIN DM) 100-10 MG/5ML syrup Take 15 mLs by mouth every 6 (six) hours as needed for cough.   ipratropium (ATROVENT ) 0.06 % nasal spray Place 2 sprays into both nostrils at bedtime.  LORazepam  (ATIVAN ) 0.5 MG tablet Take 0.5 mg by mouth every 6 (six) hours as needed for anxiety. 1 tablet, oral, every 6 hours- PRN, Take 1 tablet po every 6 hours as needed for anxiety.   losartan  (COZAAR ) 50 MG tablet Take 25 mg by mouth daily.   Lysine  500 MG TABS Take 1 tablet by mouth daily at 12 noon.   montelukast  (SINGULAIR ) 10 MG tablet Take 10  mg by mouth at bedtime.   Naphazoline-Pheniramine (OPCON-A ) 0.027-0.315 % SOLN Place 1 drop into both eyes as needed (eye allergies (red/irritated eyes)).   omeprazole (PRILOSEC) 20 MG capsule Take 20 mg by mouth 2 (two) times daily before a meal.    potassium chloride  (KLOR-CON  M10) 10 MEQ tablet Take 3 tablets by mouth daily (Patient taking differently: Take 30 mEq by mouth daily.)   Probiotic Product (ALIGN) 4 MG CAPS Take 4 mg by mouth every evening.   psyllium (METAMUCIL) 58.6 % powder Take 1 packet by mouth in the morning and at bedtime.   VENTOLIN  HFA 108 (90 Base) MCG/ACT inhaler Inhale 1-2 puffs into the lungs every 6 (six) hours as needed for wheezing or shortness of breath.   VITAMIN D  PO Take 1,000 Units by mouth daily.   zolpidem  (AMBIEN ) 10 MG tablet Take 10 mg by mouth at bedtime as needed.   acetaminophen  (TYLENOL ) 500 MG tablet Take 500 mg by mouth at bedtime. (Patient not taking: Reported on 07/23/2023)   chlorpheniramine-HYDROcodone  (TUSSIONEX) 10-8 MG/5ML Take 5 mLs by mouth at bedtime as needed for up to 14 days for cough. (Patient not taking: Reported on 07/23/2023)   Mometasone  Furoate (ASMANEX  HFA) 100 MCG/ACT AERO Inhale 1 puff into the lungs daily as needed (wheezing). (Patient not taking: Reported on 07/23/2023)   No facility-administered encounter medications on file as of 07/23/2023.    Review of Systems  Vitals:   07/23/23 1134  BP: 123/74  Pulse: (!) 55  Resp: 12  Temp: 98.8 F (37.1 C)  SpO2: 95%  Height: 5\' 10"  (1.778 m)   Body mass index is 30.42 kg/m. Physical Exam  Labs reviewed: Basic Metabolic Panel: Recent Labs    07/12/23 0633 07/12/23 1226 07/13/23 0338 07/13/23 1233 07/15/23 0352 07/15/23 1338 07/16/23 0324 07/16/23 1254 07/16/23 2058 07/17/23 0352 07/20/23 0000  NA 125*   < > 124*   < > 128*   < > 128*   < > 132* 133* 133*  K 3.4*  --  3.8   < > 3.5  --  4.1  --   --  3.7 4.1  CL 97*  --  95*   < > 94*  --  98  --   --  99 99  CO2  20*  --  20*   < > 24  --  18*  --   --  26 23*  GLUCOSE 149*  --  114*   < > 139*  --  120*  --   --  133*  --   BUN 8  --  10   < > 10  --  11  --   --  14 10  CREATININE 0.70  --  0.95   < > 0.86  --  0.87  --   --  0.97 0.8  CALCIUM  8.0*  --  7.6*   < > 8.2*  --  7.8*  --   --  8.4* 8.4*  MG 1.9  --  1.8  --   --   --  2.1  --   --   --   --    < > = values in this interval not displayed.   Liver Function Tests: Recent Labs    07/11/23 2150 07/12/23 0633 07/13/23 0338 07/20/23 0000  AST 48* 56* 60* 105*  ALT 24 28 29  147*  ALKPHOS 66 55 50 115  BILITOT 0.9 1.3* 0.9  --   PROT 6.1* 6.0* 5.5*  --   ALBUMIN 3.8 3.3* 2.8* 2.8*   No results for input(s): "LIPASE", "AMYLASE" in the last 8760 hours. No results for input(s): "AMMONIA" in the last 8760 hours. CBC: Recent Labs    07/11/23 2150 07/12/23 0633 07/13/23 0338 07/15/23 0352 07/16/23 0324 07/20/23 0000  WBC 11.0*   < > 10.4 13.8* 11.0* 7.3  NEUTROABS 8.3*  --   --   --   --   --   HGB 12.1*   < > 13.1 11.6* 10.9* 11.0*  HCT 34.3*   < > 37.1* 34.1* 31.6* 32*  MCV 89.6   < > 91.6 92.9 91.6  --   PLT 131*   < > 116* 166 167 7*   < > = values in this interval not displayed.   Cardiac Enzymes: No results for input(s): "CKTOTAL", "CKMB", "CKMBINDEX", "TROPONINI" in the last 8760 hours. BNP: Invalid input(s): "POCBNP" CBG: No results for input(s): "GLUCAP" in the last 8760 hours.  Procedures and Imaging Studies During Stay: CT CHEST WO CONTRAST Result Date: 07/17/2023 CLINICAL DATA:  Pneumonia.  Complication suspected. EXAM: CT CHEST WITHOUT CONTRAST TECHNIQUE: Multidetector CT imaging of the chest was performed following the standard protocol without IV contrast. RADIATION DOSE REDUCTION: This exam was performed according to the departmental dose-optimization program which includes automated exposure control, adjustment of the mA and/or kV according to patient size and/or use of iterative reconstruction technique.  COMPARISON:  Chest radiograph dated 07/15/2023. FINDINGS: Evaluation of this exam is limited in the absence of intravenous contrast. Cardiovascular: There is no cardiomegaly. Trace pericardial effusion. Coronary vascular calcification of the LAD and RCA. Mild atherosclerotic calcification of the thoracic aorta. No aneurysmal dilatation. The central pulmonary arteries are grossly unremarkable. Mediastinum/Nodes: No hilar or mediastinal adenopathy. The esophagus is grossly unremarkable. No mediastinal fluid collection. Lungs/Pleura: Small bilateral pleural effusions. Bilateral patchy ground-glass opacities consistent with multifocal pneumonia. Clinical correlation and follow-up to resolution recommended. No pneumothorax. The central airways are patent. Upper Abdomen: No acute abnormality. Musculoskeletal: No acute osseous pathology. Left shoulder arthroplasty. IMPRESSION: 1. Multifocal pneumonia. Clinical correlation and follow-up to resolution recommended. 2. Small bilateral pleural effusions. 3.  Aortic Atherosclerosis (ICD10-I70.0). Electronically Signed   By: Angus Bark M.D.   On: 07/17/2023 14:07   ECHOCARDIOGRAM COMPLETE Result Date: 07/17/2023    ECHOCARDIOGRAM REPORT   Patient Name:   Brandon Simmons. Date of Exam: 07/17/2023 Medical Rec #:  [id number]                Height:       70.0 in Accession #:    [id number]               Weight:       213.0 lb Date of Birth:  07-04-1945                 BSA:          2.144 m Patient Age:    80 years  BP:           134/70 mmHg Patient Gender: M                        HR:           60 bpm. Exam Location:  Inpatient Procedure: 2D Echo, Cardiac Doppler and Color Doppler (Both Spectral and Color            Flow Doppler were utilized during procedure). Indications:    I50.9* Heart failure (unspecified)  History:        Patient has prior history of Echocardiogram examinations, most                 recent 08/24/2018. Arrythmias:Atrial Fibrillation.   Sonographer:    Andrena Bang Referring Phys: [phone] Alveda Aures LIN IMPRESSIONS  1. Left ventricular ejection fraction, by estimation, is 55 to 60%. The left ventricle has normal function. The left ventricle has no regional wall motion abnormalities. Left ventricular diastolic parameters are consistent with Grade II diastolic dysfunction (pseudonormalization).  2. Right ventricular systolic function is normal. The right ventricular size is normal. There is mildly elevated pulmonary artery systolic pressure. The estimated right ventricular systolic pressure is 40.9 mmHg.  3. The mitral valve is normal in structure. Trivial mitral valve regurgitation. No evidence of mitral stenosis.  4. Tricuspid valve regurgitation is mild to moderate.  5. The aortic valve is tricuspid. Aortic valve regurgitation is mild.  6. Aortic dilatation noted. There is moderate dilatation of the aortic root, measuring 43 mm. There is mild dilatation of the ascending aorta, measuring 41 mm.  7. The inferior vena cava is normal in size with greater than 50% respiratory variability, suggesting right atrial pressure of 3 mmHg. Comparison(s): No significant change from prior study. Prior images reviewed side by side. FINDINGS  Left Ventricle: Left ventricular ejection fraction, by estimation, is 55 to 60%. The left ventricle has normal function. The left ventricle has no regional wall motion abnormalities. The left ventricular internal cavity size was normal in size. There is  borderline concentric left ventricular hypertrophy. Left ventricular diastolic parameters are consistent with Grade II diastolic dysfunction (pseudonormalization). Right Ventricle: The right ventricular size is normal. No increase in right ventricular wall thickness. Right ventricular systolic function is normal. There is mildly elevated pulmonary artery systolic pressure. The tricuspid regurgitant velocity is 3.08  m/s, and with an assumed right atrial pressure of 3 mmHg, the  estimated right ventricular systolic pressure is 40.9 mmHg. Left Atrium: Left atrial size was normal in size. Right Atrium: Right atrial size was normal in size. Pericardium: There is no evidence of pericardial effusion. Mitral Valve: The mitral valve is normal in structure. Trivial mitral valve regurgitation. No evidence of mitral valve stenosis. Tricuspid Valve: The tricuspid valve is normal in structure. Tricuspid valve regurgitation is mild to moderate. Aortic Valve: The aortic valve is tricuspid. Aortic valve regurgitation is mild. Aortic regurgitation PHT measures 649 msec. Aortic valve mean gradient measures 6.0 mmHg. Aortic valve peak gradient measures 10.6 mmHg. Aortic valve area, by VTI measures 2.37 cm. Pulmonic Valve: The pulmonic valve was normal in structure. Pulmonic valve regurgitation is trivial. Aorta: Aortic dilatation noted. There is moderate dilatation of the aortic root, measuring 43 mm. There is mild dilatation of the ascending aorta, measuring 41 mm. Venous: The inferior vena cava is normal in size with greater than 50% respiratory variability, suggesting right atrial pressure of 3 mmHg. IAS/Shunts: No atrial level  shunt detected by color flow Doppler.  LEFT VENTRICLE PLAX 2D LVIDd:         5.60 cm      Diastology LVIDs:         3.80 cm      LV e' medial:    7.83 cm/s LV PW:         1.20 cm      LV E/e' medial:  10.3 LV IVS:        1.00 cm      LV e' lateral:   11.00 cm/s LVOT diam:     2.20 cm      LV E/e' lateral: 7.4 LV SV:         78 LV SV Index:   37 LVOT Area:     3.80 cm  LV Volumes (MOD) LV vol d, MOD A2C: 136.0 ml LV vol d, MOD A4C: 120.0 ml LV vol s, MOD A2C: 44.6 ml LV vol s, MOD A4C: 52.7 ml LV SV MOD A2C:     91.4 ml LV SV MOD A4C:     120.0 ml LV SV MOD BP:      80.8 ml RIGHT VENTRICLE RV S prime:     16.90 cm/s TAPSE (M-mode): 3.4 cm LEFT ATRIUM             Index LA diam:        4.00 cm 1.87 cm/m LA Vol (A2C):   53.5 ml 24.96 ml/m LA Vol (A4C):   30.5 ml 14.23 ml/m LA  Biplane Vol: 42.7 ml 19.92 ml/m  AORTIC VALVE AV Area (Vmax):    2.80 cm AV Area (Vmean):   2.46 cm AV Area (VTI):     2.37 cm AV Vmax:           163.00 cm/s AV Vmean:          112.000 cm/s AV VTI:            0.331 m AV Peak Grad:      10.6 mmHg AV Mean Grad:      6.0 mmHg LVOT Vmax:         120.00 cm/s LVOT Vmean:        72.600 cm/s LVOT VTI:          0.206 m LVOT/AV VTI ratio: 0.62 AI PHT:            649 msec  AORTA Ao Asc diam: 4.10 cm MITRAL VALVE               TRICUSPID VALVE MV Area (PHT): 2.80 cm    TR Peak grad:   37.9 mmHg MV Decel Time: 271 msec    TR Vmax:        308.00 cm/s MV E velocity: 81.00 cm/s MV A velocity: 40.70 cm/s  SHUNTS MV E/A ratio:  1.99        Systemic VTI:  0.21 m                            Systemic Diam: 2.20 cm Karyl Paget Croitoru MD Electronically signed by Luana Rumple MD Signature Date/Time: 07/17/2023/12:39:23 PM    Final    DG CHEST PORT 1 VIEW Result Date: 07/15/2023 CLINICAL DATA:  Cough.  Shortness of breath.  Pneumonia. EXAM: PORTABLE CHEST 1 VIEW COMPARISON:  07/11/2023 FINDINGS: Left shoulder arthroplasty. Patient rotated right. Mild cardiomegaly. No pleural effusion or pneumothorax. New right upper lobe airspace disease.  Increased bibasilar airspace disease. Relative sparing of the left upper lung. IMPRESSION: New and progressive multifocal pneumonia. Electronically Signed   By: Lore Rode M.D.   On: 07/15/2023 10:04   CT Head Wo Contrast Result Date: 07/11/2023 CLINICAL DATA:  Altered mental status EXAM: CT HEAD WITHOUT CONTRAST TECHNIQUE: Contiguous axial images were obtained from the base of the skull through the vertex without intravenous contrast. RADIATION DOSE REDUCTION: This exam was performed according to the departmental dose-optimization program which includes automated exposure control, adjustment of the mA and/or kV according to patient size and/or use of iterative reconstruction technique. COMPARISON:  None Available. FINDINGS: Brain: No  mass,hemorrhage or extra-axial collection. Normal appearance of the parenchyma and CSF spaces. Vascular: No hyperdense vessel or unexpected vascular calcification. Skull: The visualized skull base, calvarium and extracranial soft tissues are normal. Sinuses/Orbits: No fluid levels or advanced mucosal thickening of the visualized paranasal sinuses. No mastoid or middle ear effusion. Normal orbits. Other: None. IMPRESSION: Normal head CT. Electronically Signed   By: Juanetta Nordmann M.D.   On: 07/11/2023 23:35   DG Chest Port 1 View Result Date: 07/11/2023 CLINICAL DATA:  Sepsis. EXAM: PORTABLE CHEST 1 VIEW COMPARISON:  01/19/2023 FINDINGS: The cardiac silhouette, mediastinal and hilar contours are within normal limits. Streaky bibasilar density could be atelectasis or early infiltrates. No pleural effusions. No pulmonary lesions. The bony thorax is intact. Stable left shoulder arthroplasty. IMPRESSION: Streaky bibasilar density could be atelectasis or early infiltrates. Electronically Signed   By: Marrian Siva M.D.   On: 07/11/2023 22:01    Assessment/Plan:    1. Multifocal pneumonia (Primary) Improving, will f/u with pulmonary for repeat imaging Ready for discharge.   2. Mild intermittent chronic asthma with acute exacerbation Continues with a chronic dry cough Started on trellegy No significant sob and wheeze  3. Paroxysmal atrial fibrillation (HCC) On tikosyn  for rate control Eliquis  fo CVA risk prevention Followed by cardiology   4. Elevated LFTs Repeat labs today Not a pattern of obstruction Feeling well F/u with PCP   5. Localized edema Lasix  20 mg every day x 2 d Kdur 10 every day x 2 d Compression hose.   6. Gastroesophageal reflux disease without esophagitis On pepcid  and prilosec   7. Chronic cough Continues to be an issue Will f/u with pulmonary On maximal therapy for post nasal drip, reflux. Just started trellegy   8. Hypokalemia Continues on K supplement  K  4.1  9. Hyponatremia NA 133 resolved.   10. Weakness Much improved with rest and therapy Ready for discharged to [place]  11. HTN Controlled  12. Anemia Hgb 11.0  Noted prior hx of prostate ca s/p prostatectomy. Neg PET SCan 02/05/23    Patient is being discharged with the following home health services:  NA  Patient is being discharged with the following durable medical equipment:  has walker   Patient has been advised to f/u with their PCP in 1-2 weeks to for a transitions of care visit.  Social services at their facility was responsible for arranging this appointment.  Pt was provided with adequate prescriptions of noncontrolled medications to reach the scheduled appointment .  For controlled substances, a limited supply was provided as appropriate for the individual patient.  If the pt normally receives these medications from a pain clinic or has a contract with another physician, these medications should be received from that clinic or physician only).    Future labs/tests needed:  Recommend f/u LFTS with PCP. Statin on  hold until f/u blood work. CMP for 07/23/23 pending at the time of this note   Discharge review, assessment, plan, and coordination took >30 min

## 2023-07-25 DIAGNOSIS — I480 Paroxysmal atrial fibrillation: Secondary | ICD-10-CM | POA: Diagnosis not present

## 2023-07-25 DIAGNOSIS — M6281 Muscle weakness (generalized): Secondary | ICD-10-CM | POA: Diagnosis not present

## 2023-07-25 DIAGNOSIS — R262 Difficulty in walking, not elsewhere classified: Secondary | ICD-10-CM | POA: Diagnosis not present

## 2023-07-25 DIAGNOSIS — J189 Pneumonia, unspecified organism: Secondary | ICD-10-CM | POA: Diagnosis not present

## 2023-07-26 DIAGNOSIS — J189 Pneumonia, unspecified organism: Secondary | ICD-10-CM | POA: Diagnosis not present

## 2023-07-26 DIAGNOSIS — J45909 Unspecified asthma, uncomplicated: Secondary | ICD-10-CM | POA: Diagnosis not present

## 2023-07-26 DIAGNOSIS — E785 Hyperlipidemia, unspecified: Secondary | ICD-10-CM | POA: Diagnosis not present

## 2023-07-26 DIAGNOSIS — G9340 Encephalopathy, unspecified: Secondary | ICD-10-CM | POA: Diagnosis not present

## 2023-07-26 DIAGNOSIS — R748 Abnormal levels of other serum enzymes: Secondary | ICD-10-CM | POA: Diagnosis not present

## 2023-07-26 DIAGNOSIS — E871 Hypo-osmolality and hyponatremia: Secondary | ICD-10-CM | POA: Diagnosis not present

## 2023-07-26 DIAGNOSIS — I2510 Atherosclerotic heart disease of native coronary artery without angina pectoris: Secondary | ICD-10-CM | POA: Diagnosis not present

## 2023-07-31 ENCOUNTER — Ambulatory Visit

## 2023-07-31 ENCOUNTER — Encounter: Payer: Self-pay | Admitting: Pulmonary Disease

## 2023-07-31 ENCOUNTER — Ambulatory Visit (INDEPENDENT_AMBULATORY_CARE_PROVIDER_SITE_OTHER): Admitting: Pulmonary Disease

## 2023-07-31 ENCOUNTER — Other Ambulatory Visit: Payer: Self-pay

## 2023-07-31 VITALS — BP 117/69 | HR 63 | Ht 70.0 in | Wt 200.0 lb

## 2023-07-31 DIAGNOSIS — J4530 Mild persistent asthma, uncomplicated: Secondary | ICD-10-CM

## 2023-07-31 DIAGNOSIS — J189 Pneumonia, unspecified organism: Secondary | ICD-10-CM

## 2023-07-31 DIAGNOSIS — J45909 Unspecified asthma, uncomplicated: Secondary | ICD-10-CM

## 2023-07-31 DIAGNOSIS — R051 Acute cough: Secondary | ICD-10-CM

## 2023-07-31 DIAGNOSIS — K219 Gastro-esophageal reflux disease without esophagitis: Secondary | ICD-10-CM | POA: Diagnosis not present

## 2023-07-31 DIAGNOSIS — R490 Dysphonia: Secondary | ICD-10-CM | POA: Diagnosis not present

## 2023-07-31 DIAGNOSIS — I4891 Unspecified atrial fibrillation: Secondary | ICD-10-CM

## 2023-07-31 DIAGNOSIS — M6281 Muscle weakness (generalized): Secondary | ICD-10-CM | POA: Diagnosis not present

## 2023-07-31 DIAGNOSIS — R262 Difficulty in walking, not elsewhere classified: Secondary | ICD-10-CM | POA: Diagnosis not present

## 2023-07-31 DIAGNOSIS — I480 Paroxysmal atrial fibrillation: Secondary | ICD-10-CM | POA: Diagnosis not present

## 2023-07-31 MED ORDER — VENTOLIN HFA 108 (90 BASE) MCG/ACT IN AERS: 1.0000 | INHALATION_SPRAY | Freq: Four times a day (QID) | RESPIRATORY_TRACT | 6 refills | Status: AC | PRN

## 2023-07-31 MED ORDER — IPRATROPIUM-ALBUTEROL 0.5-2.5 (3) MG/3ML IN SOLN: 3.0000 mL | Freq: Four times a day (QID) | RESPIRATORY_TRACT | 3 refills | Status: DC | PRN

## 2023-07-31 NOTE — Patient Instructions (Signed)
 VISIT SUMMARY:  During today's visit, we discussed your persistent cough, reflux disease, dysphonia due to inhaler use, recurrent pneumonia, and previously elevated liver enzymes. We reviewed your current medications and made some adjustments to your treatment plan. We also planned for follow-up imaging and tests before your upcoming trip to the [facility].  YOUR PLAN:  -PERSISTENT COUGH: A persistent cough is a cough that lasts for an extended period. You should continue using Asmanex  (two puffs in the morning and two at night) and Pepcid  for reflux management. Use Delsym  during the day and hydrocodone  at night as needed. We will order a chest x-ray in mid-June before your travel. Please send a MyChart message to your provider after the x-ray for review.  -REFLUX DISEASE: Reflux disease occurs when stomach acid frequently flows back into the tube connecting your mouth and stomach. Continue using Pepcid  and try elevating the head of your bed to reduce symptoms.  -DYSPHONIA DUE TO INHALER: Dysphonia is difficulty in speaking due to a problem with your voice. This was caused by the Trelegy inhaler, which you have discontinued. Your voice should improve now that you have stopped using it.  -RECURRENT PNEUMONIA: Recurrent pneumonia is having multiple episodes of pneumonia over a period of time. We will order an immunoglobulin blood test to check for immune deficiency. Stay up to date with your vaccines, including pneumonia, RSV, and flu. Use an incentive spirometer regularly to help your lungs.  -ELEVATED LIVER ENZYMES (RESOLVED): Elevated liver enzymes can indicate liver inflammation or damage. Your recent tests show normal results. We will request your PCP to send the recent liver function test results.  INSTRUCTIONS:  Please continue with your current medications as discussed. We will order a chest x-ray in mid-June before your trip to the [facility]. After the x-ray, send a MyChart message to your  provider for review. Additionally, we will order an immunoglobulin blood test to check for immune deficiency. Stay up to date with your vaccines, including pneumonia, RSV, and flu. Use an incentive spirometer regularly to help your lungs. We will also request your PCP to send the recent liver function test results.

## 2023-07-31 NOTE — Telephone Encounter (Signed)
 FYI

## 2023-07-31 NOTE — Progress Notes (Signed)
 Brandon Simmons    [id number]    [birth year]  Primary Care Physician:Paterson, Cris Dollar, MD  Referring Physician: Bertha Broad, MD [address] [place],  [place] [address]   Chief complaint: Follow-up mild asthma, acute visit after hospitalization for pneumonia  HPI: 78 year old with history of arrhythmias, asthma, prostate cancer, DVT Developed community-acquired pneumonia in mid April.  Symptoms started off with a upper respiratory viral infection and proceeded to cough, fevers of 102.  He was evaluated in the ED on 4/22 and given amoxicillin  with no improvement.  He was reevaluated on 4/24 and doxycycline  added.  He got a round of prednisone  as well.  He was given breztri from his primary care doctor but had to stop after few days as he developed sore throat and hoarseness.  He then got levofloxacin  and prednisone  for 7 days.  Unfortunately developed atrial fibrillation transiently while on prednisone  but converted back to sinus rhythm.  Overall improved with this treatment  History notable for mild asthma for which he sees Dr. Phill Brazil at allergy center.  Treated with Asmanex  inhaler, albuterol . He also had a DVT in 2019 in the setting of surgery and was treated with anticoagulants  Developed COVID-19 in October 2022 after a trip to [place].  Treated with Molnupiravir but has persistent cough since then.    Admitted to [facility] in September 2013 for community-acquired pneumonia with right upper lobe and middle lobe infiltrate.  Treated with Rocephin  and doxycycline  with negative cultures.  Switch to Omnicef  on discharge for total of 7 days of therapy.  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  No feather pillows or comforters Smoking history: Minimal smoking history in his 59s Travel  history: No significant travel history Relevant family history: No family history of lung disease  Interim history: Discussed the use of AI scribe software for clinical note transcription with the patient, who gave verbal consent to proceed.  History of Present Illness  Brandon Simmons. "Brandon Simmons" is a 78 year old male with recurrent pneumonia who presents with a persistent cough.   He was hospitalized on July 11, 2023, for multifocal pneumonia affecting three different lobes of his lungs and was discharged with a four-day course of doxycycline  and cefdinir   He has been experiencing a persistent cough that has not resolved despite previous treatments. Although the cough has improved, it remains present. He previously used Trelegy, but it was too strong and caused significant dysphonia, leading him to discontinue it after almost a week. He is currently using Asmanex , taking two puffs in the morning and two at night.  He manages reflux with Pepcid  and often takes Delsym  during the day to control the cough. At night, he uses generic Desenex (hydrocodone ) to aid sleep, as it is the only medication that allows him to rest. He has a small bottle from the hospital and a larger bottle prescribed in 2023, which he plans to use as it expires in September 2024.  He mentions having elevated liver tests previously, which were repeated by his PCP last Thursday and showed improvement. Although he does not have the results with him, he confirms that the numbers were good.  He has a history of recurrent pneumonia, having had it three times in four years, with each episode  being progressively worse. He is concerned about the severity of his last pneumonia in 2025, stating, 'I don't think I have another pneumonia in me.'  He is planning a trip to the [facility] on June 21st and is interested in completing any necessary medical evaluations before then. He has a nebulizer machine at home but has never used it.  He is open to trying nebulized medications to help with his current symptoms.    Outpatient Encounter Medications as of 07/31/2023  Medication Sig   acetaminophen  (TYLENOL ) 500 MG tablet Take 500 mg by mouth at bedtime.   apixaban  (ELIQUIS ) 5 MG TABS tablet Take 5 mg by mouth 2 (two) times daily.    Ascorbic Acid  (VITAMIN C  PO) Take 500 mg by mouth daily at 12 noon.   atorvastatin  (LIPITOR) 20 MG tablet Take 20 mg by mouth at bedtime.   Azelastine  HCl 137 MCG/SPRAY SOLN Place 1 spray into both nostrils 2 (two) times daily.   Benzocaine -Menthol  (CEPACOL) 15-2.3 MG LOZG Use as directed 1 lozenge in the mouth or throat every 6 (six) hours as needed.   chlorpheniramine-HYDROcodone  (TUSSIONEX) 10-8 MG/5ML Take 5 mLs by mouth at bedtime as needed for up to 14 days for cough.   diltiazem  (CARDIZEM ) 30 MG tablet Take 1 tablet every 4 hours AS NEEDED for afib rapid heart rate over 100 (Patient taking differently: Take 30 mg by mouth every 4 (four) hours as needed (afib rapid heart rate over 100).)   dofetilide  (TIKOSYN ) 500 MCG capsule Take 1 capsule (500 mcg total) by mouth 2 (two) times daily.   EPINEPHrine  0.3 mg/0.3 mL IJ SOAJ injection Inject 0.3 mg into the muscle as needed for anaphylaxis.   famotidine  (PEPCID ) 20 MG tablet Take 20 mg by mouth at bedtime. 20 mg, oral, At bedtime   fexofenadine (ALLEGRA ALLERGY) 180 MG tablet Take 1 tablet by mouth daily.   fluticasone  (FLONASE ) 50 MCG/ACT nasal spray Place 1 spray into both nostrils every evening.   GLUCOSAMINE-CHONDROITIN PO Take 1,500 mg by mouth daily.   ipratropium (ATROVENT ) 0.06 % nasal spray Place 2 sprays into both nostrils at bedtime.   LORazepam  (ATIVAN ) 0.5 MG tablet Take 0.5 mg by mouth every 6 (six) hours as needed for anxiety. 1 tablet, oral, every 6 hours- PRN, Take 1 tablet po every 6 hours as needed for anxiety.   losartan  (COZAAR ) 50 MG tablet Take 25 mg by mouth daily.   Lysine  500 MG TABS Take 1 tablet by mouth daily at 12  noon.   mometasone  (ASMANEX , 120 METERED DOSES,) 220 MCG/ACT inhaler Inhale 1 puff into the lungs daily.   montelukast  (SINGULAIR ) 10 MG tablet Take 10 mg by mouth at bedtime.   Naphazoline-Pheniramine (OPCON-A ) 0.027-0.315 % SOLN Place 1 drop into both eyes as needed (eye allergies (red/irritated eyes)).   omeprazole (PRILOSEC) 20 MG capsule Take 20 mg by mouth 2 (two) times daily before a meal.    potassium chloride  (KLOR-CON ) 10 MEQ tablet Take 10 mEq by mouth once.   potassium chloride  SA (KLOR-CON  M) 20 MEQ tablet Take 30 mEq by mouth daily.   Probiotic Product (ALIGN) 4 MG CAPS Take 4 mg by mouth every evening.   psyllium (METAMUCIL) 58.6 % powder Take 1 packet by mouth in the morning and at bedtime.   VENTOLIN  HFA 108 (90 Base) MCG/ACT inhaler Inhale 1-2 puffs into the lungs every 6 (six) hours as needed for wheezing or shortness of breath.   VITAMIN D  PO Take 1,000  Units by mouth daily.   zolpidem  (AMBIEN ) 10 MG tablet Take 10 mg by mouth at bedtime as needed.   Fluticasone -Umeclidin-Vilant (TRELEGY ELLIPTA ) 200-62.5-25 MCG/ACT AEPB 2 samples   guaiFENesin -dextromethorphan  (ROBITUSSIN DM) 100-10 MG/5ML syrup Take 15 mLs by mouth every 6 (six) hours as needed for cough.   No facility-administered encounter medications on file as of 07/31/2023.    Physical Exam: Blood pressure 117/69, pulse 63, height 5\' 10"  (1.778 m), weight 200 lb (90.7 kg), SpO2 96%. Gen:      No acute distress HEENT:  EOMI, sclera anicteric Neck:     No masses; no thyromegaly Lungs:    Clear to auscultation bilaterally; normal respiratory effort CV:         Regular rate and rhythm; no murmurs Abd:      + bowel sounds; soft, non-tender; no palpable masses, no distension Ext:    No edema; adequate peripheral perfusion Skin:      Warm and dry; no rash Neuro: alert and oriented x 3 Psych: normal mood and affect   Data Reviewed: Imaging: Chest x-ray 07/09/2020- minimal basal atelectasis Chest x-ray 07/11/2020-  patchy bibasilar opacities left greater than right Chest x-ray report from primary care 08/24/2020-slight improvement in bilateral infiltrates PET scan 10/13/2020-pulmonary nodule in the left lower lobe with limited uptake.  Architectural distortion in the left lower lobe. Chest x-ray 02/17/2021-persistent left lower lobe opacity CT chest 02/25/2021- 4 mm nodule at the left lung base.  New small nodules in the inferior lingula, ascending thoracic aortic aneurysm. Chest x-ray 11/27/2021- dense right upper lobe airspace disease Chest x-ray 11/29/2021- Improving right upper lobe opacity Chest x-ray 12/08/2021- Improving right upper lobe infiltrate Cardiac CT 12/08/2021-development of right upper and middle lobe airspace disease and groundglass opacities. Chest x-ray 01/20/2022-improvement in right upper lobe opacity. CT chest 07/17/2023-multifocal pneumonia, small bilateral effusion Chest x-ray 07/20/2023-Shows improvement in pneumonia with residual consolidation in the right lower lobe I have reviewed the images personally.  PFTs:   Labs:  Assessment & Plan Persistent cough Post hospitalization in April for multifocal pneumonia Persistent cough with improvement since last visit. Trelegy inhaler discontinued due to dysphonia. Currently using Asmanex  at maximum dosage and Pepcid  for reflux management. Delsym  used during the day and hydrocodone  at night for symptomatic relief. No immediate chest x-ray needed as improvement is expected, but follow-up imaging planned before travel. Steroids avoided due to potential cardiac effects.  - Continue Pepcid  for reflux management. - Use Delsym  during the day as needed. - Use hydrocodone  at night for sleep as needed.  Patient has enough supplies at home - Order chest x-ray in mid-June before travel. - Send MyChart message to provider after x-ray for review.  Asthma Asthma previously well-controlled with Asmanex , Flonase , and Singulair . Current exacerbation likely  due to multifocal pneumonia.  He did not tolerate Trelegy inhaler and is back on Asmanex  which he is increased the dose to 2 puffs twice daily -Continue Asmanex  and use albuterol  as a rescue inhaler if needed.  Reflux disease Reflux disease managed with omeprazole twice daily and added Pepcid  at night. Continued use of additional Pepcid  recommended for a few more weeks due to ongoing cough symptoms. - Elevate head of bed to reduce reflux symptoms.  Dysphonia due to inhaler Dysphonia attributed to Trelegy inhaler, which was discontinued. Improvement in voice noted after stopping the inhaler.  Recurrent pneumonia Recurrent pneumonia with three episodes in four years. Pulmonary rehab not deemed necessary. Discussion of potential immune deficiency workup due to recurrent  infections. Use of incentive spirometer recommended during illness. - Order immunoglobulin blood test to assess for immune deficiency. - Stay up to date with vaccines including pneumonia, RSV, and flu. - Use incentive spirometer regularly.  Atrial fibrillation Atrial fibrillation managed with Tikosyn . Prednisone  contraindicated due to adverse effects leading to hospitalization in the past. Alternative treatments considered to avoid exacerbating atrial fibrillation.  Elevated liver enzymes (resolved) Previous elevation in liver enzymes resolved. Recent tests by PCP show normal results. - Request PCP to send recent liver function test results.    Plan/Reccmmendations: Continue Asmanex  Mucinex , flutter valve, nebulizer Continue omeprazole, added Pepcid  at night Follow-up chest x-ray in 1 month  Phyllis Breeze MD [facility] 07/31/2023, 11:10 AM  CC: Bertha Broad, MD

## 2023-08-01 LAB — IGG, IGA, IGM
IgG (Immunoglobin G), Serum: 1438 mg/dL (ref 600–1540)
IgM, Serum: 160 mg/dL (ref 50–300)
Immunoglobulin A: 236 mg/dL (ref 70–320)

## 2023-08-01 LAB — IGE: IgE (Immunoglobulin E), Serum: 187 kU/L — ABNORMAL HIGH (ref <=114)

## 2023-08-01 NOTE — Telephone Encounter (Signed)
 [name]  Is good to know that you are vaccinated against pneumococcus.  I agree that you can hold the nasal spray while you are taking the DuoNeb to prevent over use of the ipratropium medication.  When you settle down and go back to using the DuoNeb as needed which we hope is rarely then you can restart the nasal spray.  Your labs show mild elevation in IgE which is consistent with asthma.  The rest of your immunoglobulin levels are normal which indicates that you do not have significant immunodeficiency as a cause of recurrent pneumonia.  Dr. Waylan Haggard

## 2023-08-02 DIAGNOSIS — J189 Pneumonia, unspecified organism: Secondary | ICD-10-CM | POA: Diagnosis not present

## 2023-08-02 DIAGNOSIS — I480 Paroxysmal atrial fibrillation: Secondary | ICD-10-CM | POA: Diagnosis not present

## 2023-08-02 DIAGNOSIS — R262 Difficulty in walking, not elsewhere classified: Secondary | ICD-10-CM | POA: Diagnosis not present

## 2023-08-02 DIAGNOSIS — M6281 Muscle weakness (generalized): Secondary | ICD-10-CM | POA: Diagnosis not present

## 2023-08-07 DIAGNOSIS — R262 Difficulty in walking, not elsewhere classified: Secondary | ICD-10-CM | POA: Diagnosis not present

## 2023-08-07 DIAGNOSIS — I480 Paroxysmal atrial fibrillation: Secondary | ICD-10-CM | POA: Diagnosis not present

## 2023-08-07 DIAGNOSIS — M6281 Muscle weakness (generalized): Secondary | ICD-10-CM | POA: Diagnosis not present

## 2023-08-07 DIAGNOSIS — J189 Pneumonia, unspecified organism: Secondary | ICD-10-CM | POA: Diagnosis not present

## 2023-08-09 DIAGNOSIS — R262 Difficulty in walking, not elsewhere classified: Secondary | ICD-10-CM | POA: Diagnosis not present

## 2023-08-09 DIAGNOSIS — J189 Pneumonia, unspecified organism: Secondary | ICD-10-CM | POA: Diagnosis not present

## 2023-08-09 DIAGNOSIS — M6281 Muscle weakness (generalized): Secondary | ICD-10-CM | POA: Diagnosis not present

## 2023-08-09 DIAGNOSIS — I480 Paroxysmal atrial fibrillation: Secondary | ICD-10-CM | POA: Diagnosis not present

## 2023-08-09 MED ORDER — PREDNISONE 20 MG PO TABS: 20.0000 mg | ORAL_TABLET | Freq: Every day | ORAL | 0 refills | Status: AC

## 2023-08-09 NOTE — Telephone Encounter (Signed)
 Dr. Waylan Haggard, Please see patient message regarding prednisone  and advise.  Thank you.

## 2023-08-09 NOTE — Addendum Note (Signed)
 Addended byPhyllis Breeze on: 08/09/2023 01:22 PM   Modules accepted: Orders

## 2023-08-12 ENCOUNTER — Telehealth: Payer: Self-pay | Admitting: Home Health

## 2023-08-12 ENCOUNTER — Other Ambulatory Visit (HOSPITAL_COMMUNITY): Payer: Self-pay | Admitting: Physician Assistant

## 2023-08-12 NOTE — Telephone Encounter (Signed)
 Patient called after-hours line, states he had a rough course of pneumonia in April and continued to suffer post pneumonia cough and respiratory symptoms, recently started prednisone  20 mg on Thursday by his pulmonologist, started to feel better.  After 3 days of prednisone , he noticed himself back in atrial fibrillation 11:30 PM last night.  He has a Kardia mobile, heart rate was at 139 bpm.  He took 30 mg Cardizem  at 1:30 AM today.  He noted his heart rate was down to 61 bpm with blood pressure 128/80.  He is questioning if he should reduce prednisone  dose or stop it since he is back in A-fib, and has a tendency to have A-fib recurrence when he takes steroids.   Explained to the patient that if he is not feeling significantly poor/symptomatic from A-fib, okay to use as needed Cardizem  for heart rate control will complete the short course of prednisone , which has helped him recovering from postviral pneumonia bronchitis.  He can discuss with his pulmonologist regarding shorter course of prednisone  or reduced dosing, if this would continue to help him recover.  If he has significant symptoms from A-fib that limits function, it may be meaningful to stop prednisone .  He has agreed with above plan.

## 2023-08-14 DIAGNOSIS — I480 Paroxysmal atrial fibrillation: Secondary | ICD-10-CM | POA: Diagnosis not present

## 2023-08-14 DIAGNOSIS — J189 Pneumonia, unspecified organism: Secondary | ICD-10-CM | POA: Diagnosis not present

## 2023-08-14 DIAGNOSIS — R262 Difficulty in walking, not elsewhere classified: Secondary | ICD-10-CM | POA: Diagnosis not present

## 2023-08-14 DIAGNOSIS — M6281 Muscle weakness (generalized): Secondary | ICD-10-CM | POA: Diagnosis not present

## 2023-08-16 DIAGNOSIS — R262 Difficulty in walking, not elsewhere classified: Secondary | ICD-10-CM | POA: Diagnosis not present

## 2023-08-16 DIAGNOSIS — I480 Paroxysmal atrial fibrillation: Secondary | ICD-10-CM | POA: Diagnosis not present

## 2023-08-16 DIAGNOSIS — M6281 Muscle weakness (generalized): Secondary | ICD-10-CM | POA: Diagnosis not present

## 2023-08-16 DIAGNOSIS — J189 Pneumonia, unspecified organism: Secondary | ICD-10-CM | POA: Diagnosis not present

## 2023-08-23 DIAGNOSIS — C61 Malignant neoplasm of prostate: Secondary | ICD-10-CM | POA: Diagnosis not present

## 2023-08-24 DIAGNOSIS — I480 Paroxysmal atrial fibrillation: Secondary | ICD-10-CM | POA: Diagnosis not present

## 2023-08-24 DIAGNOSIS — R262 Difficulty in walking, not elsewhere classified: Secondary | ICD-10-CM | POA: Diagnosis not present

## 2023-08-24 DIAGNOSIS — M6281 Muscle weakness (generalized): Secondary | ICD-10-CM | POA: Diagnosis not present

## 2023-08-24 DIAGNOSIS — J189 Pneumonia, unspecified organism: Secondary | ICD-10-CM | POA: Diagnosis not present

## 2023-08-28 DIAGNOSIS — M6281 Muscle weakness (generalized): Secondary | ICD-10-CM | POA: Diagnosis not present

## 2023-08-28 DIAGNOSIS — J189 Pneumonia, unspecified organism: Secondary | ICD-10-CM | POA: Diagnosis not present

## 2023-08-28 DIAGNOSIS — I480 Paroxysmal atrial fibrillation: Secondary | ICD-10-CM | POA: Diagnosis not present

## 2023-08-28 DIAGNOSIS — R262 Difficulty in walking, not elsewhere classified: Secondary | ICD-10-CM | POA: Diagnosis not present

## 2023-08-29 ENCOUNTER — Encounter: Payer: Self-pay | Admitting: Cardiology

## 2023-08-29 DIAGNOSIS — R9721 Rising PSA following treatment for malignant neoplasm of prostate: Secondary | ICD-10-CM | POA: Diagnosis not present

## 2023-08-29 DIAGNOSIS — C61 Malignant neoplasm of prostate: Secondary | ICD-10-CM | POA: Diagnosis not present

## 2023-08-29 NOTE — Telephone Encounter (Signed)
 Reviewed with Dr. Arlester Ladd and called patient.  He will move dose by 1 hour/day for 2-3 days to adjust.  Patient voiced understanding of plan

## 2023-08-30 DIAGNOSIS — R262 Difficulty in walking, not elsewhere classified: Secondary | ICD-10-CM | POA: Diagnosis not present

## 2023-08-30 DIAGNOSIS — I480 Paroxysmal atrial fibrillation: Secondary | ICD-10-CM | POA: Diagnosis not present

## 2023-08-30 DIAGNOSIS — J189 Pneumonia, unspecified organism: Secondary | ICD-10-CM | POA: Diagnosis not present

## 2023-08-30 DIAGNOSIS — M6281 Muscle weakness (generalized): Secondary | ICD-10-CM | POA: Diagnosis not present

## 2023-08-31 ENCOUNTER — Ambulatory Visit (INDEPENDENT_AMBULATORY_CARE_PROVIDER_SITE_OTHER)

## 2023-08-31 DIAGNOSIS — R059 Cough, unspecified: Secondary | ICD-10-CM | POA: Diagnosis not present

## 2023-08-31 DIAGNOSIS — J189 Pneumonia, unspecified organism: Secondary | ICD-10-CM

## 2023-09-03 DIAGNOSIS — I480 Paroxysmal atrial fibrillation: Secondary | ICD-10-CM | POA: Diagnosis not present

## 2023-09-03 DIAGNOSIS — R262 Difficulty in walking, not elsewhere classified: Secondary | ICD-10-CM | POA: Diagnosis not present

## 2023-09-03 DIAGNOSIS — J189 Pneumonia, unspecified organism: Secondary | ICD-10-CM | POA: Diagnosis not present

## 2023-09-03 DIAGNOSIS — M6281 Muscle weakness (generalized): Secondary | ICD-10-CM | POA: Diagnosis not present

## 2023-09-04 ENCOUNTER — Ambulatory Visit: Payer: Self-pay | Admitting: Pulmonary Disease

## 2023-09-04 DIAGNOSIS — J189 Pneumonia, unspecified organism: Secondary | ICD-10-CM

## 2023-10-01 ENCOUNTER — Encounter: Payer: Self-pay | Admitting: Pulmonary Disease

## 2023-10-01 MED ORDER — CEFDINIR 300 MG PO CAPS: 300.0000 mg | ORAL_CAPSULE | Freq: Two times a day (BID) | ORAL | 0 refills | Status: DC

## 2023-10-01 MED ORDER — DOXYCYCLINE HYCLATE 100 MG PO TABS: 100.0000 mg | ORAL_TABLET | Freq: Two times a day (BID) | ORAL | 0 refills | Status: DC

## 2023-10-08 DIAGNOSIS — E7849 Other hyperlipidemia: Secondary | ICD-10-CM | POA: Diagnosis not present

## 2023-10-08 DIAGNOSIS — I2510 Atherosclerotic heart disease of native coronary artery without angina pectoris: Secondary | ICD-10-CM | POA: Diagnosis not present

## 2023-10-08 DIAGNOSIS — C61 Malignant neoplasm of prostate: Secondary | ICD-10-CM | POA: Diagnosis not present

## 2023-10-08 DIAGNOSIS — I10 Essential (primary) hypertension: Secondary | ICD-10-CM | POA: Diagnosis not present

## 2023-10-08 DIAGNOSIS — E785 Hyperlipidemia, unspecified: Secondary | ICD-10-CM | POA: Diagnosis not present

## 2023-10-08 DIAGNOSIS — Z1212 Encounter for screening for malignant neoplasm of rectum: Secondary | ICD-10-CM | POA: Diagnosis not present

## 2023-10-10 DIAGNOSIS — J301 Allergic rhinitis due to pollen: Secondary | ICD-10-CM | POA: Diagnosis not present

## 2023-10-10 DIAGNOSIS — J3089 Other allergic rhinitis: Secondary | ICD-10-CM | POA: Diagnosis not present

## 2023-10-12 ENCOUNTER — Ambulatory Visit (HOSPITAL_COMMUNITY)
Admission: RE | Admit: 2023-10-12 | Discharge: 2023-10-12 | Disposition: A | Discharge status: Home / Self Care | Source: Ambulatory Visit | Attending: Physician Assistant | Admitting: Physician Assistant

## 2023-10-12 ENCOUNTER — Encounter (HOSPITAL_COMMUNITY): Payer: Self-pay | Admitting: Physician Assistant

## 2023-10-12 VITALS — BP 142/82 | HR 60 | Ht 70.0 in | Wt 204.2 lb

## 2023-10-12 DIAGNOSIS — Z5181 Encounter for therapeutic drug level monitoring: Secondary | ICD-10-CM | POA: Insufficient documentation

## 2023-10-12 DIAGNOSIS — I480 Paroxysmal atrial fibrillation: Secondary | ICD-10-CM | POA: Diagnosis not present

## 2023-10-12 DIAGNOSIS — Z79899 Other long term (current) drug therapy: Secondary | ICD-10-CM | POA: Insufficient documentation

## 2023-10-12 DIAGNOSIS — D6869 Other thrombophilia: Secondary | ICD-10-CM | POA: Diagnosis not present

## 2023-10-12 NOTE — Progress Notes (Signed)
 Primary Care Physician: Yolande Toribio MATSU, MD Primary Cardiologist: Lynwood Schilling, MD Electrophysiologist: Will Gladis Norton, MD  Referring Physician: Dr Kelsie Leos Qunicy Higinbotham. is a 78 y.o. male with a history of aortic atherosclerosis, HTN, prostate cancer, atrial fibrillation who presents for follow up in the [facility]. He is post ablation x 3 on 11/2018, 11/11/2019, 12/14/2021.  Previously on flecainide  for rhythm control. Patient is on Eliquis  for stroke prevention. He had more afib and flecainide  was discontinued. Patient is s/p dofetilide  admission 11/19-11/22/24.   Patient returns for follow up for atrial fibrillation and dofetilide  monitoring. He reports that he has an episode of afib about once every two weeks. It is usually during the night and resolves with a PRN dose of diltiazem . There does not appears to be any specific triggers.   Today, he  denies symptoms of chest pain, shortness of breath, orthopnea, PND, lower extremity edema, dizziness, presyncope, syncope, snoring, daytime somnolence, bleeding, or neurologic sequela. The patient is tolerating medications without difficulties and is otherwise without complaint today.    Atrial Fibrillation Risk Factors:  he does not have symptoms or diagnosis of sleep apnea. he does not have a history of rheumatic fever.   Atrial Fibrillation Management history:  Previous antiarrhythmic drugs: flecainide , dofetilide   Previous cardioversions: 2 remotely  Previous ablations: 03/28/18, 11/11/19, 12/14/21 Anticoagulation history: Eliquis   ROS- All systems are reviewed and negative except as per the HPI above.  Past Medical History:  Diagnosis Date   Aortic atherosclerosis (HCC)    Arthritis    Asthma    Diverticulitis    Diverticulosis    DVT (deep venous thrombosis) (HCC) 2019   right leg after back surgery   Dysrhythmia    A-fib every few weeks   Ectatic thoracic aorta (HCC)    a.  4.3cm by CT 03/2018.   GERD (gastroesophageal reflux disease)    Hemorrhoids    History of echocardiogram    a. Echo 10/16 [facility]):  EF 55%, trace MR, mild TR, mild to mod AI, mild dilated Ao root (41 mm)   Hyperlipemia    Organic erectile dysfunction    PAF (paroxysmal atrial fibrillation) (HCC)    a. admx to [facility] in [place], [place] 89/83 with AF with RVR >> converted to NSR with IV Dilt;  b. Flecainide  started >> ETT neg for pro-arrhythmia    Pneumonia 06/2020   Prostate cancer (HCC) 04/25/2006   Gleason 3+4=7   Prostatitis    S/P cardiac cath    a. LHC at [facility] 10/16:  LM ok, mLAD 30%, LCx ok, dRCA 20%   S/P radiation therapy 01/19/2014 through 03/05/2014                                                      Prostate bed 6600 cGy in 33 sessions                           Torn rotator cuff    right  40% tear    Current Outpatient Medications  Medication Sig Dispense Refill   acetaminophen  (TYLENOL ) 500 MG tablet Take 500 mg by mouth at bedtime.     apixaban  (ELIQUIS ) 5 MG TABS tablet Take 5 mg  by mouth 2 (two) times daily.      Ascorbic Acid  (VITAMIN C  PO) Take 500 mg by mouth daily at 12 noon.     atorvastatin  (LIPITOR) 20 MG tablet Take 20 mg by mouth at bedtime.     cefdinir  (OMNICEF ) 300 MG capsule Take 1 capsule (300 mg total) by mouth 2 (two) times daily. 14 capsule 0   diltiazem  (CARDIZEM ) 30 MG tablet Take 1 tablet every 4 hours AS NEEDED for afib rapid heart rate over 100 60 tablet 1   dofetilide  (TIKOSYN ) 500 MCG capsule TAKE 1 CAPSULE BY MOUTH 2 TIMES DAILY. 180 capsule 1   doxycycline  (VIBRA -TABS) 100 MG tablet Take 1 tablet (100 mg total) by mouth 2 (two) times daily. 14 tablet 0   EPINEPHrine  0.3 mg/0.3 mL IJ SOAJ injection Inject 0.3 mg into the muscle as needed for anaphylaxis.     fexofenadine (ALLEGRA ALLERGY) 180 MG tablet Take 1 tablet by mouth daily.     GLUCOSAMINE-CHONDROITIN PO Take 1,500 mg by mouth daily.     losartan  (COZAAR ) 50  MG tablet Take 25 mg by mouth daily.     Lysine  500 MG TABS Take 1 tablet by mouth daily at 12 noon.     mometasone  (ASMANEX , 120 METERED DOSES,) 220 MCG/ACT inhaler Inhale 1 puff into the lungs daily.     montelukast  (SINGULAIR ) 10 MG tablet Take 10 mg by mouth at bedtime.     Naphazoline-Pheniramine (OPCON-A ) 0.027-0.315 % SOLN Place 1 drop into both eyes as needed (eye allergies (red/irritated eyes)).     omeprazole (PRILOSEC) 20 MG capsule Take 20 mg by mouth 2 (two) times daily before a meal.      potassium chloride  (KLOR-CON  M) 10 MEQ tablet Take 10 mEq by mouth 3 (three) times daily.     Probiotic Product (ALIGN) 4 MG CAPS Take 4 mg by mouth every evening.     psyllium (METAMUCIL) 58.6 % powder Take 1 packet by mouth in the morning and at bedtime.     VENTOLIN  HFA 108 (90 Base) MCG/ACT inhaler Inhale 1-2 puffs into the lungs every 6 (six) hours as needed for wheezing or shortness of breath. 6.7 g 6   VITAMIN D  PO Take 1,000 Units by mouth daily.     zolpidem  (AMBIEN ) 10 MG tablet Take 10 mg by mouth at bedtime as needed.     No current facility-administered medications for this encounter.    Physical Exam: BP (!) 142/82   Pulse 60   Ht 5' 10 (1.778 m)   Wt 92.6 kg   BMI 29.30 kg/m   GEN: Well nourished, well developed in no acute distress CARDIAC: Regular rate and rhythm, no murmurs, rubs, gallops RESPIRATORY:  Clear to auscultation without rales, wheezing or rhonchi  ABDOMEN: Soft, non-tender, non-distended EXTREMITIES:  No edema; No deformity    Wt Readings from Last 3 Encounters:  10/12/23 92.6 kg  07/31/23 90.7 kg  07/23/23 96.2 kg     EKG today demonstrates  SR Vent. rate 60 BPM PR interval 172 ms QRS duration 92 ms QT/QTcB 418/418 ms   Echo 07/17/23 demonstrated   1. Left ventricular ejection fraction, by estimation, is 55 to 60%. The  left ventricle has normal function. The left ventricle has no regional  wall motion abnormalities. Left ventricular  diastolic parameters are  consistent with Grade II diastolic dysfunction (pseudonormalization).   2. Right ventricular systolic function is normal. The right ventricular  size is normal. There is mildly  elevated pulmonary artery systolic  pressure. The estimated right ventricular systolic pressure is 40.9 mmHg.   3. The mitral valve is normal in structure. Trivial mitral valve  regurgitation. No evidence of mitral stenosis.   4. Tricuspid valve regurgitation is mild to moderate.   5. The aortic valve is tricuspid. Aortic valve regurgitation is mild.   6. Aortic dilatation noted. There is moderate dilatation of the aortic  root, measuring 43 mm. There is mild dilatation of the ascending aorta,  measuring 41 mm.   7. The inferior vena cava is normal in size with greater than 50%  respiratory variability, suggesting right atrial pressure of 3 mmHg.     CHA2DS2-VASc Score = 4  The patient's score is based upon: CHF History: 0 HTN History: 1 Diabetes History: 0 Stroke History: 0 Vascular Disease History: 1 (aortic atherosclerosis) Age Score: 2 Gender Score: 0       ASSESSMENT AND PLAN: Paroxysmal Atrial Fibrillation (ICD10:  I48.0) The patient's CHA2DS2-VASc score is 4, indicating a 4.8% annual risk of stroke.   S/p afib ablation 03/2018, 11/11/19, 12/14/21 Previously failed flecainide  S/p dofetilide  admission 01/2023 Patient appears to be maintaining SR. He is happy with his current burden of afib.  Continue dofetilide  500 mcg BID Continue Eliquis  5 mg BID Continue diltiazem  30 mg PRN q 4 hours for heart racing. Scheduled diltiazem  stopped due to bradycardia.  Secondary Hypercoagulable State (ICD10:  D68.69) The patient is at significant risk for stroke/thromboembolism based upon his CHA2DS2-VASc Score of 4.  Continue Apixaban  (Eliquis ). No bleeding issues.   High Risk Medication Monitoring (ICD 10: Z79.899) QT interval on ECG acceptable for dofetilide  monitoring. Check bmet/mag  today.     HTN Stable on current regimen   Follow up in the AF clinic in 3-4 months.       Daril Kicks PA-C Afib Clinic [facility] [address] [place], [place] [address] [phone]

## 2023-10-13 LAB — BASIC METABOLIC PANEL WITH GFR
BUN/Creatinine Ratio: 17 (ref 10–24)
BUN: 17 mg/dL (ref 8–27)
CO2: 22 mmol/L (ref 20–29)
Calcium: 9.2 mg/dL (ref 8.6–10.2)
Chloride: 102 mmol/L (ref 96–106)
Creatinine, Ser: 1.02 mg/dL (ref 0.76–1.27)
Glucose: 94 mg/dL (ref 70–99)
Potassium: 4.3 mmol/L (ref 3.5–5.2)
Sodium: 136 mmol/L (ref 134–144)
eGFR: 75 mL/min/1.73 (ref >59)

## 2023-10-13 LAB — MAGNESIUM: Magnesium: 2.3 mg/dL (ref 1.6–2.3)

## 2023-10-15 ENCOUNTER — Ambulatory Visit (HOSPITAL_COMMUNITY): Payer: Self-pay | Admitting: Physician Assistant

## 2023-10-15 DIAGNOSIS — J188 Other pneumonia, unspecified organism: Secondary | ICD-10-CM | POA: Diagnosis not present

## 2023-10-15 DIAGNOSIS — Z1331 Encounter for screening for depression: Secondary | ICD-10-CM | POA: Diagnosis not present

## 2023-10-15 DIAGNOSIS — D2261 Melanocytic nevi of right upper limb, including shoulder: Secondary | ICD-10-CM | POA: Diagnosis not present

## 2023-10-15 DIAGNOSIS — D2272 Melanocytic nevi of left lower limb, including hip: Secondary | ICD-10-CM | POA: Diagnosis not present

## 2023-10-15 DIAGNOSIS — L814 Other melanin hyperpigmentation: Secondary | ICD-10-CM | POA: Diagnosis not present

## 2023-10-15 DIAGNOSIS — Z0000 Encounter for general adult medical examination without abnormal findings: Secondary | ICD-10-CM | POA: Diagnosis not present

## 2023-10-15 DIAGNOSIS — Z1339 Encounter for screening examination for other mental health and behavioral disorders: Secondary | ICD-10-CM | POA: Diagnosis not present

## 2023-10-15 DIAGNOSIS — I4891 Unspecified atrial fibrillation: Secondary | ICD-10-CM | POA: Diagnosis not present

## 2023-10-15 DIAGNOSIS — L821 Other seborrheic keratosis: Secondary | ICD-10-CM | POA: Diagnosis not present

## 2023-10-15 DIAGNOSIS — C61 Malignant neoplasm of prostate: Secondary | ICD-10-CM | POA: Diagnosis not present

## 2023-10-15 DIAGNOSIS — D1801 Hemangioma of skin and subcutaneous tissue: Secondary | ICD-10-CM | POA: Diagnosis not present

## 2023-10-15 DIAGNOSIS — R82998 Other abnormal findings in urine: Secondary | ICD-10-CM | POA: Diagnosis not present

## 2023-10-15 DIAGNOSIS — D2262 Melanocytic nevi of left upper limb, including shoulder: Secondary | ICD-10-CM | POA: Diagnosis not present

## 2023-10-15 DIAGNOSIS — K219 Gastro-esophageal reflux disease without esophagitis: Secondary | ICD-10-CM | POA: Diagnosis not present

## 2023-10-15 DIAGNOSIS — I2510 Atherosclerotic heart disease of native coronary artery without angina pectoris: Secondary | ICD-10-CM | POA: Diagnosis not present

## 2023-10-15 DIAGNOSIS — D225 Melanocytic nevi of trunk: Secondary | ICD-10-CM | POA: Diagnosis not present

## 2023-11-02 DIAGNOSIS — R059 Cough, unspecified: Secondary | ICD-10-CM | POA: Diagnosis not present

## 2023-11-02 DIAGNOSIS — C61 Malignant neoplasm of prostate: Secondary | ICD-10-CM | POA: Diagnosis not present

## 2023-11-02 DIAGNOSIS — U071 COVID-19: Secondary | ICD-10-CM | POA: Diagnosis not present

## 2023-11-07 DIAGNOSIS — J302 Other seasonal allergic rhinitis: Secondary | ICD-10-CM | POA: Diagnosis not present

## 2023-11-07 DIAGNOSIS — J45909 Unspecified asthma, uncomplicated: Secondary | ICD-10-CM | POA: Diagnosis not present

## 2023-11-07 DIAGNOSIS — Z8616 Personal history of COVID-19: Secondary | ICD-10-CM | POA: Diagnosis not present

## 2023-11-07 DIAGNOSIS — R058 Other specified cough: Secondary | ICD-10-CM | POA: Diagnosis not present

## 2023-11-07 DIAGNOSIS — I4891 Unspecified atrial fibrillation: Secondary | ICD-10-CM | POA: Diagnosis not present

## 2023-11-13 ENCOUNTER — Ambulatory Visit (INDEPENDENT_AMBULATORY_CARE_PROVIDER_SITE_OTHER): Admitting: Allergy and Immunology

## 2023-11-13 VITALS — BP 128/78 | HR 74 | Ht 70.0 in | Wt 203.0 lb

## 2023-11-13 DIAGNOSIS — D696 Thrombocytopenia, unspecified: Secondary | ICD-10-CM | POA: Diagnosis not present

## 2023-11-13 DIAGNOSIS — R748 Abnormal levels of other serum enzymes: Secondary | ICD-10-CM | POA: Diagnosis not present

## 2023-11-13 DIAGNOSIS — J3081 Allergic rhinitis due to animal (cat) (dog) hair and dander: Secondary | ICD-10-CM | POA: Diagnosis not present

## 2023-11-13 DIAGNOSIS — Z8701 Personal history of pneumonia (recurrent): Secondary | ICD-10-CM

## 2023-11-13 DIAGNOSIS — J3089 Other allergic rhinitis: Secondary | ICD-10-CM | POA: Diagnosis not present

## 2023-11-13 DIAGNOSIS — J301 Allergic rhinitis due to pollen: Secondary | ICD-10-CM | POA: Diagnosis not present

## 2023-11-13 NOTE — Patient Instructions (Signed)
  1.   2.   3.   4.   5.   6.   7.   8. 

## 2023-11-13 NOTE — Progress Notes (Unsigned)
 [place] - [place] - [place] - [place] - [place]   Dear Theophilus,  Thank you for referring Brandon Simmons. to the [facility] [place]  on 11/13/2023.   Below is a summation of this patient's evaluation and recommendations.  Thank you for your referral. I will keep you informed about this patient's response to treatment.   If you have any questions please do not hesitate to contact me.   Sincerely,  Camellia DOROTHA Denis, MD Allergy / Immunology [facility] [place]    ______________________________________________________________________    NEW PATIENT NOTE  Referring Provider: Mannam, Praveen, MD Primary Provider: Yolande Toribio MATSU, MD Date of office visit: 11/13/2023    Subjective:   Chief Complaint:  Brandon Simmons. (DOB: 04-21-1945) is a 78 y.o. male who presents to the clinic on 11/13/2023 with a chief complaint of Establish Care (Referred by [facility] for recurrent pna, is interested in auto-immune testing) .     HPI: Brandon Simmons presents to this clinic in evaluation of recurrent infections.  Apparently he has a long history of asthma and allergic rhinitis for which he has been receiving immunotherapy since 1990 and consistently uses Asmanex  and while doing so believes that he has very good control of his asthma without the need for using a short acting bronchodilator and he believes his upper airway disease is under very good control while using montelukast  and an antihistamine.  What concerns him is the fact that he has had 3 very significant pneumonias in the past 3 years.  His last pneumonia was in the spring 2025 requiring hospitalization and he was quite sick during that event.  He contracted COVID about 2 weeks ago and this left him with some coughing and hoarseness.  He did not receive any molnupiravir.  Fortunately, he is a lot better at this point in time from that event.  In  between his infections he has done very well and he does not have a history of recurrent sinus infections, gastrointestinal infections, chronic diarrhea, or skin issues.  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  He does need to use omeprazole twice a day for if he only takes it once a day he has burning reflux up to his throat.  He does drink at least 2 teas per day and intermittently consumes chocolate and does not consume any alcohol .  Past Medical History:  Diagnosis Date   Aortic atherosclerosis (HCC)    Arthritis    Asthma    Diverticulitis    Diverticulosis    DVT (deep venous thrombosis) (HCC) 2019   right leg after back surgery   Dysrhythmia    A-fib every few weeks   Ectatic thoracic aorta (HCC)    a. 4.3cm by CT 03/2018.   GERD (gastroesophageal reflux disease)    Hemorrhoids    History of echocardiogram    a. Echo 10/16 [facility]):  EF 55%, trace MR, mild TR, mild to mod AI, mild dilated Ao root (41 mm)   Hyperlipemia    Organic erectile dysfunction    PAF (paroxysmal atrial fibrillation) (HCC)    a. admx to [facility] in [place], [place] 89/83 with AF with RVR >> converted to NSR with IV Dilt;  b. Flecainide  started >>  ETT neg for pro-arrhythmia    Pneumonia 06/2020   Prostate cancer (HCC) 04/25/2006   Gleason 3+4=7   Prostatitis    S/P cardiac cath    a. LHC at [facility] 10/16:  LM ok, mLAD 30%, LCx ok, dRCA 20%   S/P radiation therapy 01/19/2014 through 03/05/2014                                                      Prostate bed 6600 cGy in 33 sessions                           Torn rotator cuff    right  40% tear    Past Surgical History:  Procedure Laterality Date   ABLATION OF DYSRHYTHMIC FOCUS  03/28/2018   APPENDECTOMY  1967   ATRIAL FIBRILLATION ABLATION N/A 03/28/2018   Procedure:  ATRIAL FIBRILLATION ABLATION;  Surgeon: Kelsie Agent, MD;  Location: [facility] INVASIVE CV LAB;  Service: Cardiovascular;  Laterality: N/A;   ATRIAL FIBRILLATION ABLATION N/A 11/11/2019   Procedure: ATRIAL FIBRILLATION ABLATION;  Surgeon: Kelsie Agent, MD;  Location: [facility] INVASIVE CV LAB;  Service: Cardiovascular;  Laterality: N/A;   ATRIAL FIBRILLATION ABLATION N/A 12/14/2021   Procedure: ATRIAL FIBRILLATION ABLATION;  Surgeon: Inocencio Soyla Lunger, MD;  Location: [facility] INVASIVE CV LAB;  Service: Cardiovascular;  Laterality: N/A;   BACK SURGERY  2019   CARDIAC CATHETERIZATION  2016   [place]   CATARACT EXTRACTION W/ INTRAOCULAR LENS IMPLANT Left 11/2011   CATARACT EXTRACTION W/ INTRAOCULAR LENS IMPLANT Right 09/2015   COLONOSCOPY     LARYNX SURGERY  2000   vocal cord lesion- benign   LUMBAR LAMINECTOMY/DECOMPRESSION MICRODISCECTOMY Right 07/26/2017   Procedure: RIGHT LUMBAR 3- LUMBAR 4 FORAMINOTOMY WITH RESECTION OF SYNOVIAL CYST;  Surgeon: Unice Pac, MD;  Location: [facility] OR;  Service: Neurosurgery;  Laterality: Right;  RIGHT LUMBAR 3- LUMBAR 4 FORAMINOTOMY WITH RESECTION OF SYNOVIAL CYST   MENISCUS REPAIR Right    POLYPECTOMY     PROSTATE BIOPSY  1999   PROSTATECTOMY  04/25/2006   REVERSE SHOULDER ARTHROPLASTY Left 09/02/2020   Procedure: REVERSE SHOULDER ARTHROPLASTY;  Surgeon: Melita Drivers, MD;  Location: [facility] ORS;  Service: Orthopedics;  Laterality: Left;     Allergies as of 11/13/2023       Reactions   Prednisone     Other Reaction(s): Afib        Medication List    acetaminophen  500 MG tablet Commonly known as: TYLENOL  Take 500 mg by mouth at bedtime.   Align 4 MG Caps Take 4 mg by mouth every evening.   Allegra Allergy 180 MG tablet Generic drug: fexofenadine Take 1 tablet by mouth daily.   amoxicillin -clavulanate 875-125 MG tablet Commonly known as: AUGMENTIN Take 1 tablet by mouth 2 (two) times daily.   apixaban  5 MG Tabs tablet Commonly known as: ELIQUIS  Take 5 mg  by mouth 2 (two) times daily.   Asmanex  (120 Metered Doses) 220 MCG/ACT inhaler Generic drug: mometasone  Inhale 1 puff into the lungs daily.   atorvastatin  20 MG tablet Commonly known as: LIPITOR Take 20 mg by mouth at bedtime.   diltiazem  30 MG tablet Commonly known as: CARDIZEM  Take 1 tablet every 4 hours AS NEEDED for afib rapid heart rate over 100  dofetilide  500 MCG capsule Commonly known as: TIKOSYN  TAKE 1 CAPSULE BY MOUTH 2 TIMES DAILY.   EPINEPHrine  0.3 mg/0.3 mL Soaj injection Commonly known as: EPI-PEN Inject 0.3 mg into the muscle as needed for anaphylaxis.   GLUCOSAMINE-CHONDROITIN PO Take 1,500 mg by mouth daily.   losartan  50 MG tablet Commonly known as: COZAAR  Take 25 mg by mouth daily.   Lysine  500 MG Tabs Take 1 tablet by mouth daily at 12 noon.   montelukast  10 MG tablet Commonly known as: SINGULAIR  Take 10 mg by mouth at bedtime.   omeprazole 20 MG capsule Commonly known as: PRILOSEC Take 20 mg by mouth 2 (two) times daily before a meal.   Opcon-A  0.027-0.315 % Soln Generic drug: Naphazoline-Pheniramine Place 1 drop into both eyes as needed (eye allergies (red/irritated eyes)).   potassium chloride  10 MEQ tablet Commonly known as: KLOR-CON  M Take 10 mEq by mouth 3 (three) times daily.   psyllium 58.6 % powder Commonly known as: METAMUCIL Take 1 packet by mouth in the morning and at bedtime.   Ventolin  HFA 108 (90 Base) MCG/ACT inhaler Generic drug: albuterol  Inhale 1-2 puffs into the lungs every 6 (six) hours as needed for wheezing or shortness of breath.   VITAMIN C  PO Take 500 mg by mouth daily at 12 noon.   VITAMIN D  PO Take 1,000 Units by mouth daily.   zolpidem  10 MG tablet Commonly known as: AMBIEN  Take 10 mg by mouth at bedtime as needed.    Review of systems negative except as noted in HPI / PMHx or noted below:  Review of Systems  Constitutional: Negative.   HENT: Negative.    Eyes: Negative.   Respiratory:  Negative.    Cardiovascular: Negative.   Gastrointestinal: Negative.   Genitourinary: Negative.   Musculoskeletal: Negative.   Skin: Negative.   Neurological: Negative.   Endo/Heme/Allergies: Negative.   Psychiatric/Behavioral: Negative.      Family History  Problem Relation Age of Onset   Emphysema Father    Lung cancer Father    Hypertension Mother    Heart disease Mother        heart attack   Arthritis Mother    Diabetes Maternal Grandmother    Heart disease Maternal Grandfather        Died age 68   Liver cancer Paternal Grandmother    Heart disease Paternal Grandfather        Died age 51   Atrial fibrillation Son    Stomach cancer Neg Hx    Rectal cancer Neg Hx    Esophageal cancer Neg Hx    Colon cancer Neg Hx     Social History   Socioeconomic History   Marital status: Married    Spouse name: Cy   Number of children: 2   Years of education: Not on file   Highest education level: Not on file  Occupational History   Occupation: Proofreader [employer]   Occupation: Research scientist (medical)  Tobacco Use   Smoking status: Former    Current packs/day: 0.00    Average packs/day: 1 pack/day for 10.0 years (10.0 ttl pk-yrs)    Types: Cigarettes    Start date: 84    Quit date: 1972    Years since quitting: 53.6   Smokeless tobacco: Never   Tobacco comments:    Former smoker 03/15/22  Vaping Use   Vaping status: Never Used  Substance and Sexual Activity   Alcohol  use: Not Currently   Drug use: No  Sexual activity: Not on file  Other Topics Concern   Not on file  Social History Narrative   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  Consultant for schools.   Travels over seas    Social Drivers of Health   Financial Resource Strain: Not on file  Food Insecurity: No Food Insecurity (07/12/2023)   Hunger Vital Sign    Worried About Running Out of Food in the Last Year: Never true    Ran Out of Food in the Last Year: Never true  Transportation Needs: No Transportation Needs (07/12/2023)    PRAPARE - Administrator, Civil Service (Medical): No    Lack of Transportation (Non-Medical): No  Physical Activity: Not on file  Stress: Not on file  Social Connections: Socially Integrated (07/12/2023)   Social Connection and Isolation Panel    Frequency of Communication with Friends and Family: More than three times a week    Frequency of Social Gatherings with Friends and Family: More than three times a week    Attends Religious Services: More than 4 times per year    Active Member of [employer] or Organizations: Yes    Attends Banker Meetings: More than 4 times per year    Marital Status: Married  Catering manager Violence: Not At Risk (07/12/2023)   Humiliation, Afraid, Rape, and Kick questionnaire    Fear of Current or Ex-Partner: No    Emotionally Abused: No    Physically Abused: No    Sexually Abused: No    Environmental and Social history  Lives in a house with a dry environment, no animals located to the household, carpet in the bedroom, no plastic in the bed, no plastic on the pillow, and no smoking ongoing with inside household.  He is a retired Runner, broadcasting/film/video. Objective:   Vitals:   11/13/23 1448  BP: 128/78  Pulse: 74  SpO2: 97%   Height: 5' 10 (177.8 cm) Weight: 203 lb (92.1 kg)  Physical Exam Constitutional:      Appearance: He is not diaphoretic.  HENT:     Head: Normocephalic.     Right Ear: Tympanic membrane, ear canal and external ear normal.     Left Ear: Tympanic membrane, ear canal and external ear normal.     Nose: Nose normal. No mucosal edema or rhinorrhea.     Mouth/Throat:     Pharynx: Uvula midline. No oropharyngeal exudate.  Eyes:     Conjunctiva/sclera: Conjunctivae normal.  Neck:     Thyroid : No thyromegaly.     Trachea: Trachea normal. No tracheal tenderness or tracheal deviation.  Cardiovascular:     Rate and Rhythm: Normal rate and regular rhythm.     Heart sounds: Normal heart sounds, S1 normal and S2 normal. No  murmur heard. Pulmonary:     Effort: No respiratory distress.     Breath sounds: Normal breath sounds. No stridor. No wheezing or rales.  Lymphadenopathy:     Head:     Right side of head: No tonsillar adenopathy.     Left side of head: No tonsillar adenopathy.     Cervical: No cervical adenopathy.  Skin:    Findings: No erythema or rash.     Nails: There is no clubbing.  Neurological:     Mental Status: He is alert.     Diagnostics: Allergy skin tests were performed.   Spirometry was performed and demonstrated an FEV1 of *** @ *** % of predicted. FEV1/FVC = ***  The  patient had an Asthma Control Test with the following results:  .     Assessment and Plan:    No diagnosis found.  Patient Instructions   1.   2.   3.   4.   5.   6.   7.   8.   Camellia DOROTHA Denis, MD Allergy / Immunology [facility] [place] 

## 2023-11-14 ENCOUNTER — Encounter: Payer: Self-pay | Admitting: Allergy and Immunology

## 2023-11-14 DIAGNOSIS — Z8701 Personal history of pneumonia (recurrent): Secondary | ICD-10-CM | POA: Diagnosis not present

## 2023-11-14 DIAGNOSIS — R748 Abnormal levels of other serum enzymes: Secondary | ICD-10-CM | POA: Diagnosis not present

## 2023-11-14 NOTE — Addendum Note (Signed)
 Addended by: FRANCIS SANTANA LABOR on: 11/14/2023 08:21 AM   Modules accepted: Orders

## 2023-11-16 DIAGNOSIS — J3081 Allergic rhinitis due to animal (cat) (dog) hair and dander: Secondary | ICD-10-CM | POA: Diagnosis not present

## 2023-11-16 DIAGNOSIS — J301 Allergic rhinitis due to pollen: Secondary | ICD-10-CM | POA: Diagnosis not present

## 2023-11-16 DIAGNOSIS — J3089 Other allergic rhinitis: Secondary | ICD-10-CM | POA: Diagnosis not present

## 2023-11-18 LAB — CBC WITH DIFFERENTIAL/PLATELET
Basophils Absolute: 0 x10E3/uL (ref 0.0–0.2)
Basos: 0 %
EOS (ABSOLUTE): 0.1 x10E3/uL (ref 0.0–0.4)
Eos: 1 %
Hematocrit: 41.8 % (ref 37.5–51.0)
Hemoglobin: 13.8 g/dL (ref 13.0–17.7)
Immature Grans (Abs): 0 x10E3/uL (ref 0.0–0.1)
Immature Granulocytes: 0 %
Lymphocytes Absolute: 1.9 x10E3/uL (ref 0.7–3.1)
Lymphs: 23 %
MCH: 30.8 pg (ref 26.6–33.0)
MCHC: 33 g/dL (ref 31.5–35.7)
MCV: 93 fL (ref 79–97)
Monocytes Absolute: 0.6 x10E3/uL (ref 0.1–0.9)
Monocytes: 7 %
Neutrophils Absolute: 6 x10E3/uL (ref 1.4–7.0)
Neutrophils: 69 %
Platelets: 228 x10E3/uL (ref 150–450)
RBC: 4.48 x10E6/uL (ref 4.14–5.80)
RDW: 12.5 % (ref 11.6–15.4)
WBC: 8.6 x10E3/uL (ref 3.4–10.8)

## 2023-11-18 LAB — STREP PNEUMONIAE 23 SEROTYPES IGG
Pneumo Ab Type 1*: >17.4 ug/mL (ref >1.3)
Pneumo Ab Type 12 (12F)*: 1.5 ug/mL (ref >1.3)
Pneumo Ab Type 14*: >30.6 ug/mL (ref >1.3)
Pneumo Ab Type 17 (17F)*: 16.2 ug/mL (ref >1.3)
Pneumo Ab Type 19 (19F)*: 1.8 ug/mL (ref >1.3)
Pneumo Ab Type 2*: 4.4 ug/mL (ref >1.3)
Pneumo Ab Type 20*: 8.4 ug/mL (ref >1.3)
Pneumo Ab Type 22 (22F)*: 6.9 ug/mL (ref >1.3)
Pneumo Ab Type 23 (23F)*: 3.7 ug/mL (ref >1.3)
Pneumo Ab Type 26 (6B)*: 2 ug/mL (ref >1.3)
Pneumo Ab Type 3*: 1 ug/mL — ABNORMAL LOW (ref >1.3)
Pneumo Ab Type 34 (10A)*: 39 ug/mL (ref >1.3)
Pneumo Ab Type 4*: 10.8 ug/mL (ref >1.3)
Pneumo Ab Type 43 (11A)*: 8.4 ug/mL (ref >1.3)
Pneumo Ab Type 5*: 35.9 ug/mL (ref >1.3)
Pneumo Ab Type 51 (7F)*: >19.6 ug/mL (ref >1.3)
Pneumo Ab Type 54 (15B)*: 33 ug/mL (ref >1.3)
Pneumo Ab Type 56 (18C)*: 9.5 ug/mL (ref >1.3)
Pneumo Ab Type 57 (19A)*: 4.8 ug/mL (ref >1.3)
Pneumo Ab Type 68 (9V)*: 3.6 ug/mL (ref >1.3)
Pneumo Ab Type 70 (33F)*: 8.3 ug/mL (ref >1.3)
Pneumo Ab Type 8*: 30.1 ug/mL (ref >1.3)
Pneumo Ab Type 9 (9N)*: 6.1 ug/mL (ref >1.3)

## 2023-11-18 LAB — HEPATIC FUNCTION PANEL
ALT: 20 IU/L (ref 0–44)
AST: 30 IU/L (ref 0–40)
Albumin: 4 g/dL (ref 3.8–4.8)
Alkaline Phosphatase: 67 IU/L (ref 44–121)
Bilirubin Total: 0.4 mg/dL (ref 0.0–1.2)
Bilirubin, Direct: 0.19 mg/dL (ref 0.00–0.40)
Total Protein: 6.5 g/dL (ref 6.0–8.5)

## 2023-11-18 LAB — DIPHTHERIA / TETANUS ANTIBODY PANEL
Diphtheria Ab: 0.47 [IU]/mL (ref <0.10)
Tetanus Ab, IgG: 0.74 [IU]/mL (ref <0.10)

## 2023-11-20 ENCOUNTER — Ambulatory Visit: Payer: Self-pay | Admitting: Allergy and Immunology

## 2023-11-20 DIAGNOSIS — J301 Allergic rhinitis due to pollen: Secondary | ICD-10-CM | POA: Diagnosis not present

## 2023-11-20 DIAGNOSIS — J3089 Other allergic rhinitis: Secondary | ICD-10-CM | POA: Diagnosis not present

## 2023-11-20 DIAGNOSIS — J3081 Allergic rhinitis due to animal (cat) (dog) hair and dander: Secondary | ICD-10-CM | POA: Diagnosis not present

## 2023-11-26 DIAGNOSIS — J3081 Allergic rhinitis due to animal (cat) (dog) hair and dander: Secondary | ICD-10-CM | POA: Diagnosis not present

## 2023-11-26 DIAGNOSIS — J301 Allergic rhinitis due to pollen: Secondary | ICD-10-CM | POA: Diagnosis not present

## 2023-11-26 DIAGNOSIS — J3089 Other allergic rhinitis: Secondary | ICD-10-CM | POA: Diagnosis not present

## 2023-11-28 DIAGNOSIS — J301 Allergic rhinitis due to pollen: Secondary | ICD-10-CM | POA: Diagnosis not present

## 2023-11-28 DIAGNOSIS — J3089 Other allergic rhinitis: Secondary | ICD-10-CM | POA: Diagnosis not present

## 2023-11-28 DIAGNOSIS — J3081 Allergic rhinitis due to animal (cat) (dog) hair and dander: Secondary | ICD-10-CM | POA: Diagnosis not present

## 2023-11-29 DIAGNOSIS — C61 Malignant neoplasm of prostate: Secondary | ICD-10-CM | POA: Diagnosis not present

## 2023-11-30 DIAGNOSIS — J301 Allergic rhinitis due to pollen: Secondary | ICD-10-CM | POA: Diagnosis not present

## 2023-11-30 DIAGNOSIS — J3081 Allergic rhinitis due to animal (cat) (dog) hair and dander: Secondary | ICD-10-CM | POA: Diagnosis not present

## 2023-11-30 DIAGNOSIS — J3089 Other allergic rhinitis: Secondary | ICD-10-CM | POA: Diagnosis not present

## 2023-12-03 ENCOUNTER — Telehealth (HOSPITAL_COMMUNITY): Payer: Self-pay | Admitting: *Deleted

## 2023-12-03 DIAGNOSIS — J3081 Allergic rhinitis due to animal (cat) (dog) hair and dander: Secondary | ICD-10-CM | POA: Diagnosis not present

## 2023-12-03 DIAGNOSIS — J3089 Other allergic rhinitis: Secondary | ICD-10-CM | POA: Diagnosis not present

## 2023-12-03 DIAGNOSIS — J301 Allergic rhinitis due to pollen: Secondary | ICD-10-CM | POA: Diagnosis not present

## 2023-12-03 NOTE — Telephone Encounter (Signed)
 Brandon Simmons called to update his af burden states having episodes couple times a week lasting 3-4 hours usually responds with just 1 dose of prn cardizem  - he wanted to make sure there was nothing different he can do to reduce burden  2:16 PM CF Clint R Fenton, PA unfortunately can't take scheduled dilt due to bradycardia. can change dofetilide  to amiodarone or go back to [facility] to see if 4th ablation is possible (last ablation was before PFA).   Discussed above with patient - would entertain the idea of repeat ablation and request appt with Dr Inocencio to discuss. Brandon Simmons in agreement.

## 2023-12-07 DIAGNOSIS — J301 Allergic rhinitis due to pollen: Secondary | ICD-10-CM | POA: Diagnosis not present

## 2023-12-07 DIAGNOSIS — J3089 Other allergic rhinitis: Secondary | ICD-10-CM | POA: Diagnosis not present

## 2023-12-07 DIAGNOSIS — J3081 Allergic rhinitis due to animal (cat) (dog) hair and dander: Secondary | ICD-10-CM | POA: Diagnosis not present

## 2023-12-10 DIAGNOSIS — J3081 Allergic rhinitis due to animal (cat) (dog) hair and dander: Secondary | ICD-10-CM | POA: Diagnosis not present

## 2023-12-10 DIAGNOSIS — J301 Allergic rhinitis due to pollen: Secondary | ICD-10-CM | POA: Diagnosis not present

## 2023-12-10 DIAGNOSIS — J3089 Other allergic rhinitis: Secondary | ICD-10-CM | POA: Diagnosis not present

## 2023-12-12 DIAGNOSIS — J3089 Other allergic rhinitis: Secondary | ICD-10-CM | POA: Diagnosis not present

## 2023-12-12 DIAGNOSIS — J3081 Allergic rhinitis due to animal (cat) (dog) hair and dander: Secondary | ICD-10-CM | POA: Diagnosis not present

## 2023-12-12 DIAGNOSIS — J301 Allergic rhinitis due to pollen: Secondary | ICD-10-CM | POA: Diagnosis not present

## 2023-12-14 DIAGNOSIS — J3089 Other allergic rhinitis: Secondary | ICD-10-CM | POA: Diagnosis not present

## 2023-12-14 DIAGNOSIS — J301 Allergic rhinitis due to pollen: Secondary | ICD-10-CM | POA: Diagnosis not present

## 2023-12-14 DIAGNOSIS — J3081 Allergic rhinitis due to animal (cat) (dog) hair and dander: Secondary | ICD-10-CM | POA: Diagnosis not present

## 2023-12-17 DIAGNOSIS — J301 Allergic rhinitis due to pollen: Secondary | ICD-10-CM | POA: Diagnosis not present

## 2023-12-17 DIAGNOSIS — J3081 Allergic rhinitis due to animal (cat) (dog) hair and dander: Secondary | ICD-10-CM | POA: Diagnosis not present

## 2023-12-17 DIAGNOSIS — J3089 Other allergic rhinitis: Secondary | ICD-10-CM | POA: Diagnosis not present

## 2023-12-18 ENCOUNTER — Encounter (HOSPITAL_COMMUNITY): Payer: Self-pay | Admitting: *Deleted

## 2023-12-18 DIAGNOSIS — Z23 Encounter for immunization: Secondary | ICD-10-CM | POA: Diagnosis not present

## 2023-12-21 DIAGNOSIS — J3089 Other allergic rhinitis: Secondary | ICD-10-CM | POA: Diagnosis not present

## 2023-12-21 DIAGNOSIS — J301 Allergic rhinitis due to pollen: Secondary | ICD-10-CM | POA: Diagnosis not present

## 2023-12-21 DIAGNOSIS — J3081 Allergic rhinitis due to animal (cat) (dog) hair and dander: Secondary | ICD-10-CM | POA: Diagnosis not present

## 2023-12-24 DIAGNOSIS — J3081 Allergic rhinitis due to animal (cat) (dog) hair and dander: Secondary | ICD-10-CM | POA: Diagnosis not present

## 2023-12-24 DIAGNOSIS — J301 Allergic rhinitis due to pollen: Secondary | ICD-10-CM | POA: Diagnosis not present

## 2023-12-24 DIAGNOSIS — J3089 Other allergic rhinitis: Secondary | ICD-10-CM | POA: Diagnosis not present

## 2023-12-27 DIAGNOSIS — J3089 Other allergic rhinitis: Secondary | ICD-10-CM | POA: Diagnosis not present

## 2023-12-27 DIAGNOSIS — J3081 Allergic rhinitis due to animal (cat) (dog) hair and dander: Secondary | ICD-10-CM | POA: Diagnosis not present

## 2023-12-27 DIAGNOSIS — J301 Allergic rhinitis due to pollen: Secondary | ICD-10-CM | POA: Diagnosis not present

## 2023-12-31 DIAGNOSIS — J3081 Allergic rhinitis due to animal (cat) (dog) hair and dander: Secondary | ICD-10-CM | POA: Diagnosis not present

## 2023-12-31 DIAGNOSIS — J3089 Other allergic rhinitis: Secondary | ICD-10-CM | POA: Diagnosis not present

## 2023-12-31 DIAGNOSIS — J301 Allergic rhinitis due to pollen: Secondary | ICD-10-CM | POA: Diagnosis not present

## 2024-01-04 DIAGNOSIS — J3081 Allergic rhinitis due to animal (cat) (dog) hair and dander: Secondary | ICD-10-CM | POA: Diagnosis not present

## 2024-01-04 DIAGNOSIS — J301 Allergic rhinitis due to pollen: Secondary | ICD-10-CM | POA: Diagnosis not present

## 2024-01-04 DIAGNOSIS — J3089 Other allergic rhinitis: Secondary | ICD-10-CM | POA: Diagnosis not present

## 2024-01-10 ENCOUNTER — Ambulatory Visit: Admitting: Cardiology

## 2024-01-20 ENCOUNTER — Encounter: Payer: Self-pay | Admitting: Cardiology

## 2024-01-21 DIAGNOSIS — J301 Allergic rhinitis due to pollen: Secondary | ICD-10-CM | POA: Diagnosis not present

## 2024-01-21 DIAGNOSIS — J3089 Other allergic rhinitis: Secondary | ICD-10-CM | POA: Diagnosis not present

## 2024-01-21 DIAGNOSIS — J3081 Allergic rhinitis due to animal (cat) (dog) hair and dander: Secondary | ICD-10-CM | POA: Diagnosis not present

## 2024-01-25 DIAGNOSIS — J301 Allergic rhinitis due to pollen: Secondary | ICD-10-CM | POA: Diagnosis not present

## 2024-01-25 DIAGNOSIS — J3089 Other allergic rhinitis: Secondary | ICD-10-CM | POA: Diagnosis not present

## 2024-01-25 DIAGNOSIS — J3081 Allergic rhinitis due to animal (cat) (dog) hair and dander: Secondary | ICD-10-CM | POA: Diagnosis not present

## 2024-01-28 ENCOUNTER — Ambulatory Visit: Attending: Cardiology | Admitting: Cardiology

## 2024-01-28 ENCOUNTER — Other Ambulatory Visit: Payer: Self-pay

## 2024-01-28 ENCOUNTER — Encounter: Payer: Self-pay | Admitting: Cardiology

## 2024-01-28 VITALS — BP 122/72 | HR 64 | Ht 70.0 in | Wt 207.0 lb

## 2024-01-28 DIAGNOSIS — D6869 Other thrombophilia: Secondary | ICD-10-CM | POA: Diagnosis not present

## 2024-01-28 DIAGNOSIS — I480 Paroxysmal atrial fibrillation: Secondary | ICD-10-CM | POA: Insufficient documentation

## 2024-01-28 DIAGNOSIS — I10 Essential (primary) hypertension: Secondary | ICD-10-CM | POA: Diagnosis not present

## 2024-01-28 DIAGNOSIS — Z79899 Other long term (current) drug therapy: Secondary | ICD-10-CM | POA: Insufficient documentation

## 2024-01-28 NOTE — Progress Notes (Signed)
 Electrophysiology Office Note:   Date:  01/28/2024  ID:  Brandon Simmons., DOB Sep 06, 1945, MRN [id number]  Primary Cardiologist: Brandon Schilling, MD Primary Heart Failure: None Electrophysiologist: Brandon Kutzer Gladis Norton, MD      History of Present Illness:   Brandon Simmons. is a 78 y.o. male with h/o aortic atherosclerosis, hypertension, prostate cancer, atrial fibrillation seen today for routine electrophysiology followup.   Discussed the use of AI scribe software for clinical note transcription with the patient, who gave verbal consent to proceed.  History of Present Illness Brandon Simmons. Brandon Simmons is a 78 year old male with atrial fibrillation who presents with increased frequency of episodes.  He experiences episodes of atrial fibrillation that have increased in frequency from every two weeks to every two or three days. Each episode lasts about three hours and responds to 30 mg of diltiazem . Despite the increased frequency, the condition has not significantly impacted his life, although the unpredictability of the episodes is concerning.  During a recent [place], he experienced an episode while observing icebergs, which he managed with diltiazem , resolving after three hours. He remains active and exercises regularly, though he notes that atrial fibrillation is discouraging and 'knocks the stuffing out of me'.  He has undergone three previous ablations for atrial fibrillation, which did not prevent recurrence. He is currently on Tikosyn  for management and is concerned about the potential side effects of amiodarone, particularly its impact on the lungs, due to his history of lung issues, including pneumonia and asthma.  He is planning future travels, including a trip to see tulips in [place], a river cruise, and a visit to [place] to see the rebuilt [employer]. He maintains a vigorous lifestyle and is a 'glass half full person' despite his condition.  he  denies chest pain, palpitations, dyspnea, PND, orthopnea, nausea, vomiting, dizziness, syncope, edema, weight gain, or early satiety.   Review of systems complete and found to be negative unless listed in HPI.   EP Information / Studies Reviewed:    EKG is ordered today. Personal review as below.  EKG Interpretation Date/Time:  Monday January 28 2024 12:17:59 EST Ventricular Rate:  64 PR Interval:  194 QRS Duration:  86 QT Interval:  412 QTC Calculation: 425 R Axis:   1  Text Interpretation: Sinus rhythm with Premature atrial complexes When compared with ECG of 12-Oct-2023 11:34, Premature atrial complexes are now Present Confirmed by Brandon Simmons ([id number]) on 01/28/2024 12:59:26 PM     Risk Assessment/Calculations:    CHA2DS2-VASc Score = 4   This indicates a 4.8% annual risk of stroke. The patient's score is based upon: CHF History: 0 HTN History: 1 Diabetes History: 0 Stroke History: 0 Vascular Disease History: 1 (aortic atherosclerosis) Age Score: 2 Gender Score: 0            Physical Exam:   VS:  BP 122/72 (BP Location: Right Arm, Patient Position: Sitting, Cuff Size: Large)   Pulse 64   Ht 5' 10 (1.778 m)   Wt 207 lb (93.9 kg)   SpO2 97%   BMI 29.70 kg/m    Wt Readings from Last 3 Encounters:  01/28/24 207 lb (93.9 kg)  11/13/23 203 lb (92.1 kg)  10/12/23 204 lb 3.2 oz (92.6 kg)     GEN: Well nourished, well developed in no acute distress NECK: No JVD; No carotid bruits CARDIAC: Regular rate and rhythm, no murmurs, rubs, gallops RESPIRATORY:  Clear to auscultation without  rales, wheezing or rhonchi  ABDOMEN: Soft, non-tender, non-distended EXTREMITIES:  No edema; No deformity   ASSESSMENT AND PLAN:    1.  Paroxysmal atrial fibrillation: Post ablation x 3, most recently 12/14/2021.  On dofetilide .  He is continue to have episodes of atrial fibrillation.  He has not had any issue with dofetilide  other than the fact that he has had more episodes of  atrial fibrillation.  He would benefit from further rhythm control.  We discussed repeat ablation using PFA versus switching to amiodarone.  At this point, he would like to avoid amiodarone if possible.  Brandon Simmons plan for ablation.  Risk, benefits, and alternatives to EP study and radiofrequency/pulse field ablation for afib were also discussed in detail today. These risks include but are not limited to stroke, bleeding, vascular damage, tamponade, perforation, damage to the esophagus, lungs, and other structures, pulmonary vein stenosis, worsening renal function, and death. The patient understands these risk and wishes to proceed.  We Brandon Simmons therefore proceed with catheter ablation at the next available time.  Carto, ICE, anesthesia are requested for the procedure.  This patient Brandon Simmons require CT prior to ablation. To be scheduled.   2.  Secondary hypercoagulable state: On Eliquis    3.  High-risk medication monitoring: Recent labs within normal limits.  QTc stable.  4.  Hypertension: Well-controlled   Follow up with EP Team as usual post procedure  Signed, Brandon Woolverton Gladis Norton, MD

## 2024-01-28 NOTE — Patient Instructions (Addendum)
 Medication Instructions:  Your physician recommends that you continue on your current medications as directed. Please refer to the Current Medication list given to you today.  *If you need a refill on your cardiac medications before your next appointment, please call your pharmacy*  Testing/Procedures: Ablation Your physician has recommended that you have an ablation. Catheter ablation is a medical procedure used to treat some cardiac arrhythmias (irregular heartbeats). During catheter ablation, a long, thin, flexible tube is put into a blood vessel in your groin (upper thigh), or neck. This tube is called an ablation catheter. It is then guided to your heart through the blood vessel. Radio frequency waves destroy small areas of heart tissue where abnormal heartbeats may cause an arrhythmia to start.   You are scheduled for Atrial Fibrillation Ablation on Friday, January 23rd with Dr. Dr. Inocencio. Please arrive at the Main Entrance A at [facility]: [address] [place], [place] [address] at 7:30am  What To Expect:  Labs: you will need to have lab work drawn within 30 days of your procedure. Please go to any [facility] location to have these drawn - no appointment is needed. Cardiac CT Scan: this will be done about 3-4 weeks prior to your procedure. You will be contacted to schedule this test. You will receive procedure instructions either through MyChart or in the mail 4-6 week prior to your procedure.  After your procedure we recommend no driving for 4 days, no lifting over 5 lbs for 7 days, and no work or strenuous activity for 7 days.  Please contact our office at [phone] if you have any questions.    Follow-Up: We will contact you to schedule your post-procedure appointments.

## 2024-01-29 DIAGNOSIS — J3081 Allergic rhinitis due to animal (cat) (dog) hair and dander: Secondary | ICD-10-CM | POA: Diagnosis not present

## 2024-01-29 DIAGNOSIS — J301 Allergic rhinitis due to pollen: Secondary | ICD-10-CM | POA: Diagnosis not present

## 2024-01-29 DIAGNOSIS — J3089 Other allergic rhinitis: Secondary | ICD-10-CM | POA: Diagnosis not present

## 2024-01-31 ENCOUNTER — Other Ambulatory Visit (HOSPITAL_COMMUNITY): Payer: Self-pay | Admitting: *Deleted

## 2024-01-31 MED ORDER — DOFETILIDE 500 MCG PO CAPS: 500.0000 ug | ORAL_CAPSULE | Freq: Two times a day (BID) | ORAL | 1 refills | Status: AC

## 2024-02-01 DIAGNOSIS — J3081 Allergic rhinitis due to animal (cat) (dog) hair and dander: Secondary | ICD-10-CM | POA: Diagnosis not present

## 2024-02-01 DIAGNOSIS — J301 Allergic rhinitis due to pollen: Secondary | ICD-10-CM | POA: Diagnosis not present

## 2024-02-01 DIAGNOSIS — J3089 Other allergic rhinitis: Secondary | ICD-10-CM | POA: Diagnosis not present

## 2024-02-03 ENCOUNTER — Other Ambulatory Visit (HOSPITAL_COMMUNITY): Payer: Self-pay | Admitting: Physician Assistant

## 2024-02-03 DIAGNOSIS — I480 Paroxysmal atrial fibrillation: Secondary | ICD-10-CM

## 2024-02-07 DIAGNOSIS — J301 Allergic rhinitis due to pollen: Secondary | ICD-10-CM | POA: Diagnosis not present

## 2024-02-07 DIAGNOSIS — J3089 Other allergic rhinitis: Secondary | ICD-10-CM | POA: Diagnosis not present

## 2024-02-07 DIAGNOSIS — J3081 Allergic rhinitis due to animal (cat) (dog) hair and dander: Secondary | ICD-10-CM | POA: Diagnosis not present

## 2024-02-11 ENCOUNTER — Ambulatory Visit (HOSPITAL_COMMUNITY): Admitting: Physician Assistant

## 2024-02-11 DIAGNOSIS — Z23 Encounter for immunization: Secondary | ICD-10-CM | POA: Diagnosis not present

## 2024-02-19 DIAGNOSIS — J3081 Allergic rhinitis due to animal (cat) (dog) hair and dander: Secondary | ICD-10-CM | POA: Diagnosis not present

## 2024-02-19 DIAGNOSIS — J3089 Other allergic rhinitis: Secondary | ICD-10-CM | POA: Diagnosis not present

## 2024-02-19 DIAGNOSIS — J301 Allergic rhinitis due to pollen: Secondary | ICD-10-CM | POA: Diagnosis not present

## 2024-02-26 DIAGNOSIS — J3081 Allergic rhinitis due to animal (cat) (dog) hair and dander: Secondary | ICD-10-CM | POA: Diagnosis not present

## 2024-02-26 DIAGNOSIS — J3089 Other allergic rhinitis: Secondary | ICD-10-CM | POA: Diagnosis not present

## 2024-02-26 DIAGNOSIS — J301 Allergic rhinitis due to pollen: Secondary | ICD-10-CM | POA: Diagnosis not present

## 2024-03-03 DIAGNOSIS — J301 Allergic rhinitis due to pollen: Secondary | ICD-10-CM | POA: Diagnosis not present

## 2024-03-03 DIAGNOSIS — J3089 Other allergic rhinitis: Secondary | ICD-10-CM | POA: Diagnosis not present

## 2024-03-03 DIAGNOSIS — J3081 Allergic rhinitis due to animal (cat) (dog) hair and dander: Secondary | ICD-10-CM | POA: Diagnosis not present

## 2024-03-10 ENCOUNTER — Telehealth: Payer: Self-pay

## 2024-03-10 NOTE — Telephone Encounter (Signed)
-----   Message from Nurse Doreatha BROCKS, RN sent at 01/28/2024  1:38 PM EST ----- Regarding: 04/11/24 afib ablation  Precert:  MD: Camnitz Type of ablation: A-fib Diagnosis: a-fib CPT code: A-fib (06343) Ablation scheduled (date/time): 04/11/24 at 9:30am  Procedure:  Added to calendar? Yes Orders entered? Yes Letter complete? No, >30 days before procedure Scheduled with cath lab? Yes Any medications to hold? No Labs ordered (CBC, BMET, PT/INR if on warfarin): Yes Mapping system: Doesn't matter CARTO/OPAL rep notified? No Cardiac CT needed? Yes, ordered Dye allergy? No Pre-meds ordered and instructions given? Yes Letter method: MyChart H&P: 11/10 Device: No  Follow-up:  Cassie/Angel, please schedule Routine.  Covering RN - please send this message to [employer], EP scheduler, EP Scheduling pool, EP [employer], and CT scheduler (Brittany Lynch/Stephanie Mogg), if indicated.

## 2024-03-19 ENCOUNTER — Encounter: Payer: Self-pay | Admitting: Cardiology

## 2024-03-24 ENCOUNTER — Ambulatory Visit (HOSPITAL_COMMUNITY)
Admission: RE | Admit: 2024-03-24 | Discharge: 2024-03-24 | Disposition: A | Discharge status: Home / Self Care | Source: Ambulatory Visit | Attending: Cardiology | Admitting: Cardiology

## 2024-03-24 DIAGNOSIS — I480 Paroxysmal atrial fibrillation: Secondary | ICD-10-CM | POA: Insufficient documentation

## 2024-03-24 MED ORDER — IOHEXOL 350 MG/ML SOLN
80.0000 mL | Freq: Once | INTRAVENOUS | Status: AC | PRN
  Administered 2024-03-24: 80 mL via INTRAVENOUS

## 2024-03-25 LAB — CBC
Hematocrit: 41.5 % (ref 37.5–51.0)
Hemoglobin: 13.9 g/dL (ref 13.0–17.7)
MCH: 31.5 pg (ref 26.6–33.0)
MCHC: 33.5 g/dL (ref 31.5–35.7)
MCV: 94 fL (ref 79–97)
Platelets: 140 x10E3/uL — ABNORMAL LOW (ref 150–450)
RBC: 4.41 x10E6/uL (ref 4.14–5.80)
RDW: 12 % (ref 11.6–15.4)
WBC: 5.8 x10E3/uL (ref 3.4–10.8)

## 2024-03-25 LAB — BASIC METABOLIC PANEL WITH GFR
BUN/Creatinine Ratio: 17 (ref 10–24)
BUN: 19 mg/dL (ref 8–27)
CO2: 23 mmol/L (ref 20–29)
Calcium: 9.4 mg/dL (ref 8.6–10.2)
Chloride: 102 mmol/L (ref 96–106)
Creatinine, Ser: 1.12 mg/dL (ref 0.76–1.27)
Glucose: 98 mg/dL (ref 70–99)
Potassium: 3.9 mmol/L (ref 3.5–5.2)
Sodium: 137 mmol/L (ref 134–144)
eGFR: 67 mL/min/1.73

## 2024-03-26 ENCOUNTER — Encounter (HOSPITAL_COMMUNITY): Payer: Self-pay

## 2024-03-26 ENCOUNTER — Telehealth (HOSPITAL_COMMUNITY): Payer: Self-pay

## 2024-03-26 NOTE — Telephone Encounter (Signed)
 Attempted to reach patient to discuss upcoming procedure, no answer. Left VM for patient to return call.

## 2024-03-31 ENCOUNTER — Encounter (HOSPITAL_COMMUNITY): Payer: Self-pay | Admitting: *Deleted

## 2024-04-04 ENCOUNTER — Encounter: Payer: Self-pay | Admitting: Cardiology

## 2024-04-07 ENCOUNTER — Emergency Department (HOSPITAL_BASED_OUTPATIENT_CLINIC_OR_DEPARTMENT_OTHER)
Admission: EM | Admit: 2024-04-07 | Discharge: 2024-04-07 | Discharge status: Against Medical Advice | Source: Ambulatory Visit | Attending: Emergency Medicine | Admitting: Emergency Medicine

## 2024-04-07 ENCOUNTER — Emergency Department (HOSPITAL_BASED_OUTPATIENT_CLINIC_OR_DEPARTMENT_OTHER): Admitting: Radiology

## 2024-04-07 ENCOUNTER — Encounter (HOSPITAL_BASED_OUTPATIENT_CLINIC_OR_DEPARTMENT_OTHER): Payer: Self-pay | Admitting: *Deleted

## 2024-04-07 ENCOUNTER — Other Ambulatory Visit: Payer: Self-pay

## 2024-04-07 DIAGNOSIS — Z5321 Procedure and treatment not carried out due to patient leaving prior to being seen by health care provider: Secondary | ICD-10-CM | POA: Insufficient documentation

## 2024-04-07 DIAGNOSIS — M25531 Pain in right wrist: Secondary | ICD-10-CM | POA: Insufficient documentation

## 2024-04-07 DIAGNOSIS — Z7901 Long term (current) use of anticoagulants: Secondary | ICD-10-CM | POA: Insufficient documentation

## 2024-04-07 DIAGNOSIS — W009XXA Unspecified fall due to ice and snow, initial encounter: Secondary | ICD-10-CM | POA: Diagnosis not present

## 2024-04-07 NOTE — ED Triage Notes (Signed)
 Right wrist pain since he slipped and fell on some ice and caught himself with his right hand.  Pt is on eliquis .  Pt did not his his head.

## 2024-04-10 NOTE — Pre-Procedure Instructions (Signed)
 Instructed patient on the following items: Arrival time 0730 Nothing to eat or drink after midnight No meds AM of procedure Responsible person to drive you home and stay with you for 24 hrs  Have you missed any doses of anti-coagulant Elqiuis- takes twice a day, hasn't missed any doses in last 4 weeks.  Don't miss dose morning of procedure.

## 2024-04-11 ENCOUNTER — Encounter (HOSPITAL_COMMUNITY)
Admission: RE | Disposition: A | Discharge status: Home / Self Care | Payer: Self-pay | Source: Home / Self Care | Attending: Cardiology

## 2024-04-11 ENCOUNTER — Other Ambulatory Visit: Payer: Self-pay

## 2024-04-11 ENCOUNTER — Ambulatory Visit (HOSPITAL_COMMUNITY): Admitting: Anesthesiology

## 2024-04-11 ENCOUNTER — Ambulatory Visit (HOSPITAL_COMMUNITY)
Admission: RE | Admit: 2024-04-11 | Discharge: 2024-04-11 | Disposition: A | Discharge status: Home / Self Care | Attending: Cardiology | Admitting: Cardiology

## 2024-04-11 DIAGNOSIS — Z8546 Personal history of malignant neoplasm of prostate: Secondary | ICD-10-CM | POA: Diagnosis not present

## 2024-04-11 DIAGNOSIS — D509 Iron deficiency anemia, unspecified: Secondary | ICD-10-CM | POA: Diagnosis not present

## 2024-04-11 DIAGNOSIS — Z87891 Personal history of nicotine dependence: Secondary | ICD-10-CM | POA: Insufficient documentation

## 2024-04-11 DIAGNOSIS — I700 Atherosclerosis of aorta: Secondary | ICD-10-CM | POA: Diagnosis not present

## 2024-04-11 DIAGNOSIS — I272 Pulmonary hypertension, unspecified: Secondary | ICD-10-CM | POA: Insufficient documentation

## 2024-04-11 DIAGNOSIS — J45909 Unspecified asthma, uncomplicated: Secondary | ICD-10-CM

## 2024-04-11 DIAGNOSIS — I10 Essential (primary) hypertension: Secondary | ICD-10-CM | POA: Diagnosis not present

## 2024-04-11 DIAGNOSIS — I4819 Other persistent atrial fibrillation: Secondary | ICD-10-CM

## 2024-04-11 DIAGNOSIS — Z86718 Personal history of other venous thrombosis and embolism: Secondary | ICD-10-CM | POA: Diagnosis not present

## 2024-04-11 DIAGNOSIS — I4891 Unspecified atrial fibrillation: Secondary | ICD-10-CM | POA: Diagnosis not present

## 2024-04-11 DIAGNOSIS — K219 Gastro-esophageal reflux disease without esophagitis: Secondary | ICD-10-CM | POA: Insufficient documentation

## 2024-04-11 DIAGNOSIS — Z79899 Other long term (current) drug therapy: Secondary | ICD-10-CM | POA: Diagnosis not present

## 2024-04-11 LAB — POCT ACTIVATED CLOTTING TIME
Activated Clotting Time: 271 s
Activated Clotting Time: 353 s

## 2024-04-11 MED ORDER — SUGAMMADEX SODIUM 200 MG/2ML IV SOLN
INTRAVENOUS | Status: DC | PRN
  Administered 2024-04-11: 200 mg via INTRAVENOUS

## 2024-04-11 MED ORDER — HEPARIN SODIUM (PORCINE) 1000 UNIT/ML IJ SOLN
INTRAMUSCULAR | Status: DC | PRN
  Administered 2024-04-11: 1000 [IU] via INTRAVENOUS

## 2024-04-11 MED ORDER — PHENYLEPHRINE HCL-NACL 20-0.9 MG/250ML-% IV SOLN
INTRAVENOUS | Status: DC | PRN
  Administered 2024-04-11: 40 ug/min via INTRAVENOUS

## 2024-04-11 MED ORDER — PHENOL 1.4 % MT LIQD
1.0000 | OROMUCOSAL | Status: DC | PRN
  Administered 2024-04-11: 1 via OROMUCOSAL
  Filled 2024-04-11: qty 177

## 2024-04-11 MED ORDER — ONDANSETRON HCL 4 MG/2ML IJ SOLN
INTRAMUSCULAR | Status: DC | PRN
  Administered 2024-04-11: 4 mg via INTRAVENOUS

## 2024-04-11 MED ORDER — ACETAMINOPHEN 500 MG PO TABS
1000.0000 mg | ORAL_TABLET | Freq: Once | ORAL | Status: AC
  Administered 2024-04-11: 1000 mg via ORAL
  Filled 2024-04-11: qty 2

## 2024-04-11 MED ORDER — ACETAMINOPHEN 325 MG PO TABS
650.0000 mg | ORAL_TABLET | ORAL | Status: DC | PRN
  Administered 2024-04-11: 650 mg via ORAL
  Filled 2024-04-11: qty 2

## 2024-04-11 MED ORDER — HEPARIN (PORCINE) IN NACL 1000-0.9 UT/500ML-% IV SOLN
INTRAVENOUS | Status: DC | PRN
  Administered 2024-04-11 (×2): 500 mL

## 2024-04-11 MED ORDER — SODIUM CHLORIDE 0.9% FLUSH: 3.0000 mL | INTRAVENOUS | Status: DC | PRN

## 2024-04-11 MED ORDER — ONDANSETRON HCL 4 MG/2ML IJ SOLN: 4.0000 mg | Freq: Four times a day (QID) | INTRAMUSCULAR | Status: DC | PRN

## 2024-04-11 MED ORDER — ATROPINE SULFATE 1 MG/10ML IJ SOSY
PREFILLED_SYRINGE | INTRAMUSCULAR | Status: DC | PRN
  Administered 2024-04-11: 1 mg via INTRAVENOUS

## 2024-04-11 MED ORDER — PROPOFOL 10 MG/ML IV BOLUS
INTRAVENOUS | Status: DC | PRN
  Administered 2024-04-11: 130 mg via INTRAVENOUS

## 2024-04-11 MED ORDER — SODIUM CHLORIDE 0.9 % IV SOLN: 250.0000 mL | INTRAVENOUS | Status: DC | PRN

## 2024-04-11 MED ORDER — HEPARIN SODIUM (PORCINE) 1000 UNIT/ML IJ SOLN
INTRAMUSCULAR | Status: DC | PRN
  Administered 2024-04-11: 15000 [IU] via INTRAVENOUS
  Administered 2024-04-11: 7000 [IU] via INTRAVENOUS

## 2024-04-11 MED ORDER — PROTAMINE SULFATE 10 MG/ML IV SOLN
INTRAVENOUS | Status: DC | PRN
  Administered 2024-04-11: 40 mg via INTRAVENOUS

## 2024-04-11 MED ORDER — SODIUM CHLORIDE 0.9 % IV SOLN: INTRAVENOUS | Status: DC

## 2024-04-11 MED ORDER — SODIUM CHLORIDE 0.9% FLUSH: 3.0000 mL | Freq: Two times a day (BID) | INTRAVENOUS | Status: DC

## 2024-04-11 MED ORDER — HEPARIN SODIUM (PORCINE) 1000 UNIT/ML IJ SOLN
INTRAMUSCULAR | Status: AC
  Filled 2024-04-11: qty 10

## 2024-04-11 MED ORDER — ROCURONIUM BROMIDE 10 MG/ML (PF) SYRINGE
PREFILLED_SYRINGE | INTRAVENOUS | Status: DC | PRN
  Administered 2024-04-11: 60 mg via INTRAVENOUS

## 2024-04-11 MED ORDER — LIDOCAINE-EPINEPHRINE 1 %-1:100000 IJ SOLN
INTRAMUSCULAR | Status: AC
  Filled 2024-04-11: qty 1

## 2024-04-11 MED ORDER — LIDOCAINE 2% (20 MG/ML) 5 ML SYRINGE
INTRAMUSCULAR | Status: DC | PRN
  Administered 2024-04-11: 60 mg via INTRAVENOUS

## 2024-04-11 NOTE — Progress Notes (Signed)
 Right groin site oozing blood. Pressure held and reapplied dressing. Dr. Inocencio in the room and aware.

## 2024-04-11 NOTE — Anesthesia Procedure Notes (Signed)
 Procedure Name: Intubation Date/Time: 04/11/2024 9:35 AM  Performed by: Alen Motto D, CRNAPre-anesthesia Checklist: Patient identified, Emergency Drugs available, Suction available and Patient being monitored Patient Re-evaluated:Patient Re-evaluated prior to induction Oxygen  Delivery Method: Circle System Utilized Preoxygenation: Pre-oxygenation with 100% oxygen  Induction Type: IV induction Ventilation: Mask ventilation without difficulty Laryngoscope Size: Mac and 4 Grade View: Grade II Tube type: Oral Tube size: 7.5 mm Number of attempts: 1 Airway Equipment and Method: Stylet and Oral airway Placement Confirmation: ETT inserted through vocal cords under direct vision, positive ETCO2 and breath sounds checked- equal and bilateral Secured at: 22 cm Tube secured with: Tape Dental Injury: Teeth and Oropharynx as per pre-operative assessment

## 2024-04-11 NOTE — Discharge Instructions (Signed)

## 2024-04-11 NOTE — Transfer of Care (Signed)
 Immediate Anesthesia Transfer of Care Note  Patient: Brandon Simmons.  Procedure(s) Performed: ATRIAL FIBRILLATION ABLATION  Patient Location: PACU  Anesthesia Type:General  Level of Consciousness: drowsy  Airway & Oxygen  Therapy: Patient Spontanous Breathing and Patient connected to face mask oxygen   Post-op Assessment: Report given to RN and Post -op Vital signs reviewed and stable  Post vital signs: Reviewed and stable  Last Vitals:  Vitals Value Taken Time  BP    Temp    Pulse 68 04/11/24 11:15  Resp 14 04/11/24 11:15  SpO2 100 % 04/11/24 11:15  Vitals shown include unfiled device data.  Last Pain:  Vitals:   04/11/24 0809  TempSrc:   PainSc: 0-No pain         Complications: There were no known notable events for this encounter.

## 2024-04-11 NOTE — H&P (Signed)
" °  Electrophysiology Office Note:   Date:  04/11/2024  ID:  Brandon [name] Sally Mickey., DOB [birth year], MRN [id number]  Primary Cardiologist: Lynwood Schilling, MD Primary Heart Failure: None Electrophysiologist: Hairo Garraway Gladis Norton, MD      History of Present Illness:   Brandon Drelyn Pistilli. is a 79 y.o. male with h/o aortic atherosclerosis, hypertension, prostate cancer, atrial fibrillation seen today for routine electrophysiology followup.   Today, denies symptoms of palpitations, chest pain, dyspnea, orthopnea, PND, lower extremity edema, claudication, dizziness, presyncope, syncope, bleeding, or neurologic sequela. The patient is tolerating medications without difficulties. Plan ablation today.   EP Information / Studies Reviewed:    EKG is ordered today. Personal review as below.        Risk Assessment/Calculations:    CHA2DS2-VASc Score =     This indicates a  % annual risk of stroke. The patient's score is based upon:              Physical Exam:   VS:  BP 116/72   Pulse 70   Temp 98 F (36.7 C) (Oral)   Resp 17   Ht 5' 10 (1.778 m)   Wt 90.7 kg   SpO2 98%   BMI 28.70 kg/m    Wt Readings from Last 3 Encounters:  04/11/24 90.7 kg  01/28/24 93.9 kg  11/13/23 92.1 kg    GEN: Well nourished, well developed in no acute distress NECK: No JVD; No carotid bruits CARDIAC: Regular rate and rhythm, no murmurs, rubs, gallops RESPIRATORY:  Clear to auscultation without rales, wheezing or rhonchi  ABDOMEN: Soft, non-tender, non-distended EXTREMITIES:  No edema; No deformity    ASSESSMENT AND PLAN:    1.  Paroxysmal atrial fibrillation: Brandon [name] Sally Mickey. has presented today for surgery, with the diagnosis of AF.  The various methods of treatment have been discussed with the patient and family. After consideration of risks, benefits and other options for treatment, the patient has consented to  Procedure(s): Catheter ablation as a surgical intervention .  Risks  include but not limited to complete heart block, stroke, esophageal damage, nerve damage, bleeding, vascular damage, tamponade, perforation, MI, and death. The patient's history has been reviewed, patient examined, no change in status, stable for surgery.  I have reviewed the patient's chart and labs.  Questions were answered to the patient's satisfaction.    Brandon Asch Norton, MD 04/11/2024 8:44 AM  "

## 2024-04-11 NOTE — Anesthesia Preprocedure Evaluation (Addendum)
"                                    Anesthesia Evaluation  Patient identified by MRN, date of birth, ID band  Reviewed: Allergy & Precautions, NPO status , Patient's Chart, lab work & pertinent test results  Airway Mallampati: II  TM Distance: >3 FB Neck ROM: Full    Dental  (+) Dental Advisory Given, Teeth Intact   Pulmonary asthma , pneumonia, resolved, neg recent URI, former smoker   Pulmonary exam normal breath sounds clear to auscultation       Cardiovascular pulmonary hypertension+ DVT  + dysrhythmias Atrial Fibrillation + Valvular Problems/Murmurs (Mild-mod TR)  Rhythm:Irregular Rate:Normal  Echo 06/2023  1. Left ventricular ejection fraction, by estimation, is 55 to 60%. The  left ventricle has normal function. The left ventricle has no regional  wall motion abnormalities. Left ventricular diastolic parameters are  consistent with Grade II diastolic dysfunction (pseudonormalization).   2. Right ventricular systolic function is normal. The right ventricular  size is normal. There is mildly elevated pulmonary artery systolic  pressure. The estimated right ventricular systolic pressure is 40.9 mmHg.   3. The mitral valve is normal in structure. Trivial mitral valve regurgitation.  No evidence of mitral stenosis.   4. Tricuspid valve regurgitation is mild to moderate.   5. The aortic valve is tricuspid. Aortic valve regurgitation is mild.   6. Aortic dilatation noted. There is moderate dilatation of the aortic root,  measuring 43 mm. There is mild dilatation of the ascending aorta,  measuring 41 mm.   7. The inferior vena cava is normal in size with greater than 50%  respiratory variability, suggesting right atrial pressure of 3 mmHg.   Comparison(s): No significant change from prior study. Prior images  reviewed side by side.     Neuro/Psych negative neurological ROS  negative psych ROS   GI/Hepatic Neg liver ROS,GERD  Medicated and Controlled,,   Endo/Other  negative endocrine ROS    Renal/GU Renal disease     Musculoskeletal  (+) Arthritis ,    Abdominal   Peds  Hematology  (+) Blood dyscrasia, anemia   Anesthesia Other Findings A-fib  Reproductive/Obstetrics                              Anesthesia Physical Anesthesia Plan  ASA: 3  Anesthesia Plan: General   Post-op Pain Management: Tylenol  PO (pre-op)*   Induction: Intravenous  PONV Risk Score and Plan: 2 and Ondansetron , Dexamethasone  and Treatment may vary due to age or medical condition  Airway Management Planned: Oral ETT  Additional Equipment:   Intra-op Plan:   Post-operative Plan: Extubation in OR  Informed Consent: I have reviewed the patients History and Physical, chart, labs and discussed the procedure including the risks, benefits and alternatives for the proposed anesthesia with the patient or authorized representative who has indicated his/her understanding and acceptance.     Dental advisory given  Plan Discussed with: CRNA  Anesthesia Plan Comments:          Anesthesia Quick Evaluation  "

## 2024-04-11 NOTE — Progress Notes (Signed)
 Right groin site continues to ooze blood. PA to come evaluate.

## 2024-04-11 NOTE — Progress Notes (Signed)
 Patient ambulated in the hall and to the bathroom at 1520. No bleeding or hematoma noted. Patient coughed and small amount of blood noted to right groin dressing. No active bleeding noted. Patient dressed for discharge. Rechecked groin sites. Left groin site intact. No bleeding or hematoma noted. Right groin site continues to ooze blood. Area is soft with no hematoma noted. Pressure held to right groin site for 20 minutes. Artist, PA came to evaluate the patient and administer treatment.

## 2024-04-13 ENCOUNTER — Encounter (HOSPITAL_COMMUNITY): Payer: Self-pay | Admitting: Cardiology

## 2024-04-14 ENCOUNTER — Telehealth (HOSPITAL_COMMUNITY): Payer: Self-pay

## 2024-04-14 ENCOUNTER — Other Ambulatory Visit (HOSPITAL_COMMUNITY): Payer: Self-pay | Admitting: *Deleted

## 2024-04-14 MED ORDER — POTASSIUM CHLORIDE CRYS ER 10 MEQ PO TBCR: 30.0000 meq | EXTENDED_RELEASE_TABLET | Freq: Every morning | ORAL | 6 refills | Status: AC

## 2024-04-14 NOTE — Anesthesia Postprocedure Evaluation (Signed)
"   Anesthesia Post Note  Patient: Brandon Simmons.  Procedure(s) Performed: ATRIAL FIBRILLATION ABLATION     Patient location during evaluation: PACU Anesthesia Type: General Level of consciousness: sedated and patient cooperative Pain management: pain level controlled Vital Signs Assessment: post-procedure vital signs reviewed and stable Respiratory status: spontaneous breathing Cardiovascular status: stable Anesthetic complications: no   There were no known notable events for this encounter.  Last Vitals:  Vitals:   04/11/24 1400 04/11/24 1500  BP: 118/74 120/73  Pulse: (!) 56 63  Resp: 11 10  Temp:    SpO2: 99% 100%    Last Pain:  Vitals:   04/11/24 1208  TempSrc:   PainSc: 8                  Radin Raptis      "

## 2024-04-14 NOTE — Telephone Encounter (Signed)
 Spoke with patient to complete post procedure follow up call.  Patient reports no complications with groin sites.   Instructions reviewed with patient:  It is normal to have bruising, tenderness, mild swelling, and a pea or marble sized lump/knot at the groin site which can take up to three months to resolve.  Get help right away if you notice sudden swelling at the puncture site.  Check your puncture site every day for signs of infection: fever, redness, swelling, pus drainage, warmth, foul odor or excessive pain. If this occurs, please call [phone], to speak with the RN Navigator. Get help right away if your puncture site is bleeding and the bleeding does not stop after applying firm pressure to the area.  You may continue to have skipped beats/ atrial fibrillation during the first several months after your procedure.  It is very important not to miss any doses of your blood thinner Eliquis.    You will follow up with the Afib clinic 4 weeks after your procedure and follow up with the Afib clinic 3 months after your procedure.  Activity restrictions reviewed.  Patient verbalized understanding to all instructions provided.

## 2024-04-15 MED FILL — Lidocaine Inj 1% w/ Epinephrine-1:100000: INTRAMUSCULAR | Qty: 20 | Status: AC

## 2024-04-17 ENCOUNTER — Other Ambulatory Visit (HOSPITAL_COMMUNITY): Payer: Self-pay | Admitting: Urology

## 2024-04-17 DIAGNOSIS — C61 Malignant neoplasm of prostate: Secondary | ICD-10-CM

## 2024-04-28 ENCOUNTER — Encounter (HOSPITAL_COMMUNITY)

## 2024-05-09 ENCOUNTER — Ambulatory Visit (HOSPITAL_COMMUNITY): Admitting: Physician Assistant

## 2024-07-10 ENCOUNTER — Ambulatory Visit (HOSPITAL_COMMUNITY): Admitting: Physician Assistant

## 2024-07-31 ENCOUNTER — Ambulatory Visit: Admitting: Pulmonary Disease
